# Patient Record
Sex: Female | Born: 1975 | Race: White | Hispanic: No | Smoking: Current every day smoker
Health system: Southern US, Community
[De-identification: ages and names within clinical notes are randomized; demographics above are authoritative.]

## PROBLEM LIST (undated history)

## (undated) DIAGNOSIS — R519 Headache, unspecified: Secondary | ICD-10-CM

## (undated) DIAGNOSIS — F319 Bipolar disorder, unspecified: Secondary | ICD-10-CM

## (undated) DIAGNOSIS — F411 Generalized anxiety disorder: Secondary | ICD-10-CM

## (undated) DIAGNOSIS — C4492 Squamous cell carcinoma of skin, unspecified: Secondary | ICD-10-CM

## (undated) DIAGNOSIS — K589 Irritable bowel syndrome without diarrhea: Secondary | ICD-10-CM

## (undated) DIAGNOSIS — M549 Dorsalgia, unspecified: Secondary | ICD-10-CM

## (undated) DIAGNOSIS — J45909 Unspecified asthma, uncomplicated: Secondary | ICD-10-CM

## (undated) DIAGNOSIS — A048 Other specified bacterial intestinal infections: Secondary | ICD-10-CM

## (undated) DIAGNOSIS — I10 Essential (primary) hypertension: Secondary | ICD-10-CM

## (undated) DIAGNOSIS — F419 Anxiety disorder, unspecified: Secondary | ICD-10-CM

## (undated) DIAGNOSIS — J449 Chronic obstructive pulmonary disease, unspecified: Secondary | ICD-10-CM

## (undated) HISTORY — DX: Generalized anxiety disorder: F41.1

## (undated) HISTORY — PX: ABDOMINAL HYSTERECTOMY: SHX81

## (undated) HISTORY — DX: Unspecified asthma, uncomplicated: J45.909

## (undated) HISTORY — DX: Headache, unspecified: R51.9

## (undated) HISTORY — DX: Chronic obstructive pulmonary disease, unspecified: J44.9

## (undated) HISTORY — DX: Squamous cell carcinoma of skin, unspecified: C44.92

## (undated) HISTORY — PX: CHOLECYSTECTOMY: SHX55

---

## 1998-12-01 ENCOUNTER — Other Ambulatory Visit: Admission: RE | Admit: 1998-12-01 | Discharge: 1998-12-01 | Payer: Self-pay | Admitting: *Deleted

## 1999-01-06 ENCOUNTER — Encounter (INDEPENDENT_AMBULATORY_CARE_PROVIDER_SITE_OTHER): Payer: Self-pay | Admitting: *Deleted

## 1999-01-06 ENCOUNTER — Ambulatory Visit (HOSPITAL_COMMUNITY): Admission: RE | Admit: 1999-01-06 | Discharge: 1999-01-06 | Payer: Self-pay | Admitting: Obstetrics and Gynecology

## 2000-09-26 ENCOUNTER — Other Ambulatory Visit: Admission: RE | Admit: 2000-09-26 | Discharge: 2000-09-26 | Payer: Self-pay | Admitting: *Deleted

## 2000-10-01 ENCOUNTER — Ambulatory Visit (HOSPITAL_COMMUNITY): Admission: RE | Admit: 2000-10-01 | Discharge: 2000-10-01 | Payer: Self-pay | Admitting: Infectious Diseases

## 2001-11-04 ENCOUNTER — Other Ambulatory Visit: Admission: RE | Admit: 2001-11-04 | Discharge: 2001-11-04 | Payer: Self-pay | Admitting: Family Medicine

## 2001-11-13 ENCOUNTER — Other Ambulatory Visit: Admission: RE | Admit: 2001-11-13 | Discharge: 2001-11-13 | Payer: Self-pay | Admitting: Family Medicine

## 2002-07-04 ENCOUNTER — Other Ambulatory Visit: Admission: RE | Admit: 2002-07-04 | Discharge: 2002-07-04 | Payer: Self-pay | Admitting: Family Medicine

## 2002-10-08 ENCOUNTER — Ambulatory Visit (HOSPITAL_COMMUNITY): Admission: RE | Admit: 2002-10-08 | Discharge: 2002-10-08 | Payer: Self-pay | Admitting: *Deleted

## 2002-12-09 ENCOUNTER — Ambulatory Visit (HOSPITAL_COMMUNITY): Admission: RE | Admit: 2002-12-09 | Discharge: 2002-12-09 | Payer: Self-pay | Admitting: Family Medicine

## 2002-12-09 ENCOUNTER — Encounter: Payer: Self-pay | Admitting: Family Medicine

## 2002-12-19 ENCOUNTER — Encounter: Payer: Self-pay | Admitting: Family Medicine

## 2002-12-19 ENCOUNTER — Ambulatory Visit (HOSPITAL_COMMUNITY): Admission: RE | Admit: 2002-12-19 | Discharge: 2002-12-19 | Payer: Self-pay | Admitting: Family Medicine

## 2002-12-26 ENCOUNTER — Inpatient Hospital Stay (HOSPITAL_COMMUNITY): Admission: AD | Admit: 2002-12-26 | Discharge: 2002-12-26 | Payer: Self-pay | Admitting: Family Medicine

## 2003-01-26 ENCOUNTER — Other Ambulatory Visit: Admission: RE | Admit: 2003-01-26 | Discharge: 2003-01-26 | Payer: Self-pay | Admitting: Family Medicine

## 2003-03-16 ENCOUNTER — Ambulatory Visit (HOSPITAL_COMMUNITY): Admission: RE | Admit: 2003-03-16 | Discharge: 2003-03-16 | Payer: Self-pay | Admitting: Family Medicine

## 2003-04-09 ENCOUNTER — Observation Stay (HOSPITAL_COMMUNITY): Admission: AD | Admit: 2003-04-09 | Discharge: 2003-04-10 | Payer: Self-pay | Admitting: Family Medicine

## 2003-04-12 ENCOUNTER — Inpatient Hospital Stay (HOSPITAL_COMMUNITY): Admission: AD | Admit: 2003-04-12 | Discharge: 2003-04-12 | Payer: Self-pay | Admitting: Family Medicine

## 2003-05-13 ENCOUNTER — Ambulatory Visit (HOSPITAL_COMMUNITY): Admission: RE | Admit: 2003-05-13 | Discharge: 2003-05-13 | Payer: Self-pay | Admitting: Family Medicine

## 2003-05-27 ENCOUNTER — Inpatient Hospital Stay (HOSPITAL_COMMUNITY): Admission: AD | Admit: 2003-05-27 | Discharge: 2003-05-29 | Payer: Self-pay | Admitting: Family Medicine

## 2003-06-30 ENCOUNTER — Inpatient Hospital Stay (HOSPITAL_COMMUNITY): Admission: AD | Admit: 2003-06-30 | Discharge: 2003-06-30 | Payer: Self-pay | Admitting: Family Medicine

## 2003-07-20 ENCOUNTER — Inpatient Hospital Stay (HOSPITAL_COMMUNITY): Admission: AD | Admit: 2003-07-20 | Discharge: 2003-07-23 | Payer: Self-pay | Admitting: Family Medicine

## 2003-07-21 ENCOUNTER — Encounter (INDEPENDENT_AMBULATORY_CARE_PROVIDER_SITE_OTHER): Payer: Self-pay | Admitting: Specialist

## 2004-08-08 ENCOUNTER — Other Ambulatory Visit: Admission: RE | Admit: 2004-08-08 | Discharge: 2004-08-08 | Payer: Self-pay | Admitting: Family Medicine

## 2005-01-17 ENCOUNTER — Other Ambulatory Visit: Admission: RE | Admit: 2005-01-17 | Discharge: 2005-01-17 | Payer: Self-pay | Admitting: Gynecology

## 2005-04-27 ENCOUNTER — Inpatient Hospital Stay (HOSPITAL_COMMUNITY): Admission: RE | Admit: 2005-04-27 | Discharge: 2005-04-28 | Payer: Self-pay | Admitting: Obstetrics and Gynecology

## 2005-04-27 ENCOUNTER — Encounter (INDEPENDENT_AMBULATORY_CARE_PROVIDER_SITE_OTHER): Payer: Self-pay | Admitting: *Deleted

## 2005-09-22 ENCOUNTER — Encounter
Admission: RE | Admit: 2005-09-22 | Discharge: 2005-09-22 | Payer: Self-pay | Admitting: Physical Medicine and Rehabilitation

## 2006-02-27 ENCOUNTER — Encounter: Admission: RE | Admit: 2006-02-27 | Discharge: 2006-02-27 | Payer: Self-pay | Admitting: Family Medicine

## 2007-03-06 ENCOUNTER — Encounter: Admission: RE | Admit: 2007-03-06 | Discharge: 2007-03-06 | Payer: Self-pay | Admitting: General Surgery

## 2007-03-08 ENCOUNTER — Ambulatory Visit (HOSPITAL_COMMUNITY): Admission: RE | Admit: 2007-03-08 | Discharge: 2007-03-08 | Payer: Self-pay | Admitting: General Surgery

## 2007-03-15 ENCOUNTER — Encounter: Admission: RE | Admit: 2007-03-15 | Discharge: 2007-03-15 | Payer: Self-pay | Admitting: General Surgery

## 2007-03-19 ENCOUNTER — Encounter: Admission: RE | Admit: 2007-03-19 | Discharge: 2007-03-19 | Payer: Self-pay | Admitting: General Surgery

## 2007-04-12 ENCOUNTER — Encounter (INDEPENDENT_AMBULATORY_CARE_PROVIDER_SITE_OTHER): Payer: Self-pay | Admitting: General Surgery

## 2007-04-12 ENCOUNTER — Ambulatory Visit (HOSPITAL_COMMUNITY): Admission: RE | Admit: 2007-04-12 | Discharge: 2007-04-12 | Payer: Self-pay | Admitting: General Surgery

## 2007-09-16 ENCOUNTER — Encounter: Admission: RE | Admit: 2007-09-16 | Discharge: 2007-09-16 | Payer: Self-pay | Admitting: General Surgery

## 2008-01-20 ENCOUNTER — Ambulatory Visit (HOSPITAL_BASED_OUTPATIENT_CLINIC_OR_DEPARTMENT_OTHER): Admission: RE | Admit: 2008-01-20 | Discharge: 2008-01-21 | Payer: Self-pay | Admitting: Urology

## 2008-02-01 ENCOUNTER — Emergency Department (HOSPITAL_COMMUNITY): Admission: EM | Admit: 2008-02-01 | Discharge: 2008-02-01 | Payer: Self-pay | Admitting: Emergency Medicine

## 2008-12-08 ENCOUNTER — Emergency Department (HOSPITAL_BASED_OUTPATIENT_CLINIC_OR_DEPARTMENT_OTHER): Admission: EM | Admit: 2008-12-08 | Discharge: 2008-12-08 | Payer: Self-pay | Admitting: Emergency Medicine

## 2009-02-19 ENCOUNTER — Encounter: Admission: RE | Admit: 2009-02-19 | Discharge: 2009-02-19 | Payer: Self-pay | Admitting: Family Medicine

## 2010-02-21 ENCOUNTER — Encounter: Admission: RE | Admit: 2010-02-21 | Discharge: 2010-02-21 | Payer: Self-pay | Admitting: Family Medicine

## 2010-07-08 LAB — HEMOCCULT GUIAC POC 1CARD (OFFICE): Fecal Occult Bld: POSITIVE

## 2010-07-08 LAB — URINALYSIS, ROUTINE W REFLEX MICROSCOPIC
Glucose, UA: NEGATIVE mg/dL
Protein, ur: NEGATIVE mg/dL
pH: 6.5 (ref 5.0–8.0)

## 2010-08-16 NOTE — Op Note (Signed)
NAMEARLIN, SAVONA             ACCOUNT NO.:  [id number]   MEDICAL RECORD NO.:  [id number]          PATIENT TYPE:  OIB   LOCATION:  2550                         FACILITY:  [facility]   PHYSICIAN:  Gabrielle Dare. Janee Morn, M.D.DATE OF BIRTH:  04/08/1975   DATE OF PROCEDURE:  04/12/2007  DATE OF DISCHARGE:                               OPERATIVE REPORT   PREOPERATIVE DIAGNOSIS:  Biliary dyskinesia.   POSTOPERATIVE DIAGNOSES:  1. Biliary dyskinesia.  2. Chronic cholecystitis.   PROCEDURE:  Laparoscopic cholecystectomy with intraoperative  cholangiogram.   SURGEON:  Gabrielle Dare. Janee Morn, M.D.   ASSISTING:  Currie Paris, M.D.   ANESTHESIA:  General.   HISTORY OF PRESENT ILLNESS:  Ms. Rightmyer is a 35 year old female who I  evaluated in the office for right upper quadrant abdominal pain.  In  addition, she was noted to have right breast mass on mammogram.  Ultrasound was unremarkable and MRI was subsequently done on March 15, 2007 demonstrating no evidence of malignancy in either breast.  Follow-up is arranged.  She does continue however to have postprandial  right upper quadrant pain and nausea consistent with biliary dyskinesia.  HIDA scan gallbladder ejection fraction was 50%.  She presents for  elective cholecystectomy as her symptoms continue.   PROCEDURE IN DETAIL:  Informed consent was obtained.  The patient was  identified in the preop holding area.  She received intravenous  antibiotics.  She was brought to the operating room and general  anesthesia was administered by the anesthesia team.  Her abdomen was  prepped and draped in a sterile fashion.  Infraumbilical region was  infiltrated with 0.25% Marcaine with epinephrine.  Infraumbilical  incision was made.  Subcutaneous tissues were dissected down revealing  anterior fascia.  This was divided sharply.  Peritoneal cavity was  entered under direct vision without difficulty.  A 0 Vicryl pursestring  suture was placed  around the fascial opening.  The Hasson trocar was  inserted into the abdomen and the abdomen was insufflated with carbon  dioxide in the standard fashion.  Under direct vision an 11 mm  epigastric and two 5 mm lateral ports were placed.  Quarter percent  Marcaine with epinephrine was placed at all port sites.  The dome of the  gallbladder was retracted superomedially.  There was a large amount of  filmy adhesions due to chronic inflammation around the body and  infundibulum of the gallbladder.  These were gradually swept away  carefully revealing the infundibulum.  The infundibulum was retracted  inferolaterally.  Dissection began laterally and progressed medially,  identifying the anterior branch of the cystic artery and the cystic  duct.  The cystic artery branch was clipped twice proximally and once  distally and divided.  Further dissection was carried out on the cystic  duct until a large window was created between the infundibulum, the  cystic duct and the liver.  Once we had excellent visualization, a clip  was placed on the infundibulum/cystic duct junction.  A small nick was  made in the cystic duct and a Reddick cholangiogram catheter was  inserted.  Intraoperative cholangiogram  was obtained, demonstrating good  flow with no filling defects in the common bile duct.  Contrast entered  the duodenum readily.  Cholangiogram was completed.  The catheter was  removed.  Three clips were placed proximally on the cystic duct and it  was divided.  Further dissection revealed a posterior branch of the  cystic artery.  This was clipped twice proximally and once distally and  divided.  The gallbladder was taken off the liver bed with the Bovie  cautery, achieving excellent hemostasis along the way.  The gallbladder  was placed in an EndoCatch bag and removed from the abdomen via the  infraumbilical port site.  The abdomen was copiously irrigated, the  irrigation was good and returned clear.   The liver bed was rechecked and  was completely dry.  Clips remained in excellent position.  The  remainder of the irrigation fluid was evacuated.  The liver bed was  rechecked and remained dry.  Ports were removed under direct vision and  the pneumoperitoneum was released.  The infraumbilical fascia was closed  by tying the 0 Vicryl pursestring suture.  All 4 wounds were copiously  irrigated.  The infraumbilical and epigastric port sites skin was closed  with running 4-0 Vicryl subcuticular stitch.  Dermabond was then used on  all 4 port sites.  The patient tolerated the procedure well without any  complication and was taken to the recovery room in stable condition.      Gabrielle Dare Janee Morn, M.D.  Electronically Signed     BET/MEDQ  D:  04/12/2007  T:  04/12/2007  Job:  [id number]   cc:   Dr. Gennette Pac

## 2010-08-16 NOTE — Op Note (Signed)
NAMEGLORINE, HANRATTY             ACCOUNT NO.:  [id number]   MEDICAL RECORD NO.:  [id number]          PATIENT TYPE:  AMB   LOCATION:  NESC                         FACILITY:  [facility]   PHYSICIAN:  Sigmund I. Patsi Sears, M.D.DATE OF BIRTH:  11-Feb-1976   DATE OF PROCEDURE:  01/20/2008  DATE OF DISCHARGE:                               OPERATIVE REPORT   PREOPERATIVE DIAGNOSES:  1. Voiding dysfunction, status post pubovaginal sling placement.  2. Cystocele, symptomatic.   POSTOPERATIVE DIAGNOSES:  1. Voiding dysfunction, status post pubovaginal sling placement.  2. Cystocele, symptomatic.   PROCEDURE:  1. Anterior vault repair with placement of uphold mesh (AMS).  2. Urethrolysis of pubovaginal sling.  3. Cystoscopy.   ATTENDING PHYSICIAN:  Dr. Jethro Bolus.   RESIDENT PHYSICIAN:  Dr. Delman Kitten.   ANESTHESIA:  General.   INDICATIONS FOR PROCEDURE:  Kadejah is a 35 year old white female with  multiple urogynecologic tissues.  She underwent laparoscopic-assisted  vaginal hysterectomy with a tension-free vaginal tape from top down,  anterior colporrhaphy and perineoplasty on April 27, 2005.  Her  indication for hysterectomy was painful heavy menses.  At that time she  was noted to have pure stress urinary incontinence and subsequently had  to the TVT placed.  Postoperatively she developed urinary retention and  subsequent voiding dysfunction.  She has been recently evaluated by Dr.  Patsi Sears, who performed urodynamics on her which demonstrated an  obstructive voiding pattern.  Exam also revealed the cystocele.  Based  on the above findings, he recommended the above-stated procedures.  All  risks, benefits, consequences and concerns were discussed and informed  consent was obtained.   PROCEDURE IN DETAIL:  The patient is brought to the operating room,  placed in supine position.  She was correctly identified by wristband  and appropriate time-out was taken.  IV  antibiotics were administered  and general anesthesia was delivered.  Once adequately anesthetized, she  was placed in a high lithotomy position with great care taken to  minimize risk of peripheral neuropathy or compartment syndrome.  Her  perineum was clipped, prepped and draped sterilely.  We began our  procedure by performing thorough physical examination.  Under  anesthesia, the cystocele with was approximately grade 3.  There did not  appear to be a middle or posterior compartment defect associated with  this.  The anterior vaginal mucosa did have a hobnail like appearance  consistent with prior incision.  We placed a 16-French Foley catheter  transurethrally into the bladder.  We drained the bladder and then  capped it.  We then placed our [facility] retractor with hooks exposing  the entire introitus.  We then made a semilunar incision across the  anterior vaginal wall sharply and created our flap initially with the  Strully scissors, but then once we got through the scar, we were able to  use blunt dissection as well.  We did take great detail to keep our flap  full-thickness.  We extended our dissection on the right and left side  until we were able to palpate both ischial spines.  Her left ischial  spine  was significantly more prominent than the right.  We then  identified the apex of the vault and dissected in the midline back to  this apex.  It should be noted that this is a redo operation and our  procedure was technically difficult secondary to the prior surgery and  subsequent scar.  We then placed a single 2-0 Vicryl suture in the  midline of the apex of our flap to anchor the uphold mesh.  We then used  our Capio device to pass our anchor hooks into both sacrospinous  ligaments, first the right, then the left.  We tested the strength of  these passages and they appeared adequate.  We then cinched down our  mesh keeping the blue line in the midline until we had snugged it  up  appropriately to the anterior vaginal wall.  We then tied our initial  holding suture in the midline.  We then positioned our uphold mesh  appropriately giving the right amount of tension and elevation to the  anterior vaginal vault and apex.  We then placed two more sutures on  either side of this apical suture both using 2-0 Vicryl.  We then  removed the sleeves from the uphold device and trimmed excess flap  tissue.  The uphold mesh completely reduced her cystocele and elevated  her apex as well.  We then copiously irrigated our wound with antibiotic  solution.  There was no active bleeding as we subsequently closed the  vaginal mucosa with a running suture using 2-0 Vicryl.  We then turned  our attention to the urethrolysis.  Again, we drained her bladder prior  to proceeding.  We then made a linear incision in the midline of the  anterior vaginal wall distal to the semilunar incision we just made and  closed.  We used blunt dissection to track on either side of the  urethra.  The sling was not noted to be in the mid urethra.  However, it  appeared to have migrated to the proximal urethra.  We were able to  palpate it best to the left of her urethra.  Here we did get into some  slight bleeding.  We were able to incise the mesh completely thus  releasing it.  We again copiously irrigated our wound.  We placed a  strip of Surgicel into the wound as well as a deep suture to try to  abate the bleeding.  We then removed the ureteral catheter performed  rigid cystourethroscopy with a 12 degree and 70 degrees lens.  Indigo  carmine had been given and we appreciated blue jets of urine effluxing  from both ureteral orifices.  She did have a slight amount of debris in  her bladder as well as evidence of significant trigonitis.  She also had  mild trabeculation to her bladder suggestive of longstanding voiding  dysfunction.  No other abnormalities were seen in her bladder.  There  was no  evidence of injury to her urethra or her bladder.  I then removed  the scope and replaced a Foley catheter inflated with 10 mL sterile  water.  We then closed the midline vaginal incision in two layers using  2-0 Vicryl.  We then placed a vaginal packing with Estrace cream in her  vaginal vault and this marked the end of our procedure.  She awoke from  anesthesia was taken to recovery room in stable condition.  There were  no complications.  Dr. Patsi Sears was present and  participated in all  aspects of the case.     ______________________________  Delman Kitten, MD      Sigmund I. Patsi Sears, M.D.  Electronically Signed    Hadley Pen  D:  01/20/2008  T:  01/20/2008  Job:  [id number]

## 2010-08-19 NOTE — Op Note (Signed)
[name], [name]             ACCOUNT NO.:  [id number]   MEDICAL RECORD NO.:  [id number]          PATIENT TYPE:  INP   LOCATION:  9313                          FACILITY:  [facility]   PHYSICIAN:  Randye Lobo, M.D.   DATE OF BIRTH:  [birth year]   DATE OF PROCEDURE:  04/27/2005  DATE OF DISCHARGE:                                 OPERATIVE REPORT   PREOPERATIVE DIAGNOSIS:  1.  Genuine stress incontinence.  2.  Incomplete uterovaginal prolapse.  3.  Dysmenorrhea.  4.  Menorrhagia.   POSTOPERATIVE DIAGNOSIS:  1.  Genuine stress incontinence.  2.  Incomplete uterovaginal prolapse.  3.  Dysmenorrhea.  4.  Menorrhagia.   PROCEDURE:  Laparoscopically-assisted vaginal hysterectomy, tension-free  vaginal tape, cystoscopy, anterior colporrhaphy, perineoplasty.   SURGEON:  Conley Simmonds, M.D.   ASSISTANT:  Carrington Clamp, M.D.   ANESTHESIA:  Is general endotracheal.   IV FLUIDS:  2000 mL Ringer's lactate.   ESTIMATED BLOOD LOSS:  Was 300 mL.   URINE OUTPUT:  Quantity sufficient   COMPLICATIONS:  None.   INDICATIONS FOR PROCEDURE:  The patient is a 35 year old para 67 Caucasian  female who was referred by Dr. Teodora Medici for evaluation and treatment of  urinary incontinence, dysmenorrhea and menorrhagia. The patient had a  history of endometriosis and was status post laparoscopy several years  prior. The patient was experiencing urinary leakage with coughing, sneezing  and running in addition to episodes of urge incontinence and enuresis.  Urodynamic testing confirmed the presence of genuine stress incontinence.  The patient had a normal pelvic ultrasound.   The patient was status post bilateral tubal ligation and she declines any  future childbearing. She wishes for definitive treatment of her heavy and  painful menses and would like surgical treatment of her urinary incontinence  as well. The plan is made to proceed with surgery after risks, benefits, and  alternatives were  discussed with her.   FINDINGS:  Examination under anesthesia revealed a third degree cystocele,  first-degree uterine prolapse, and a minimal rectocele.   At the time of laparoscopy the patient is noted to have a normal uterus,  tubes, and ovaries. There is a 2 cm simple left ovarian cyst. The appendix,  liver and stomach organ appeared to be unremarkable. There is no evidence of  endometriosis in the abdomen or the pelvis.   Cystoscopy performed during the mid urethral sling documented the absence of  a foreign body in the bladder or the urethra. The bladder was visualized  throughout 360 degrees and was noted to be normal. The ureters were patent  bilaterally after the injection of IV indigo carmine.   SPECIMEN:  The uterus was sent to pathology.   PROCEDURE:  The patient was reidentified in the preoperative hold area. She  received Mefoxin 1 gram IV for antibiotic prophylaxis. She received both TED  hose and PAS stockings for DVT prophylaxis.   In the operating room, general endotracheal anesthesia was induced and the  patient was then placed in the dorsal lithotomy position. The abdomen and  vagina were then sterilely prepped and draped and  a Foley catheter was  placed inside the bladder. The single-tooth tenaculum was placed on the  anterior cervical lip and this was replaced with a Hulka tenaculum.   The procedure began by creating a 1 cm umbilical incision with the scalpel.  A 10 mm trocar was inserted directly into the peritoneal cavity and the  laparoscope confirmed proper placement. The patient was then placed in  Trendelenburg position. A 5 mm right and then left lower quadrant incisions  were created with the scalpel and 5 mm trocars were inserted through each of  these under visualization of laparoscope.   An inspection of the pelvic and abdominal organs was performed with the  findings were as noted above. A tripolar instrument was used to perform the  laparoscopic  portion of the vaginal hysterectomy. The left proximal  fallopian tube, the left round ligament and then the left utero-ovarian  ligaments were sequentially grasped with tripolar instrument, cauterized,  and then divided each separately. The same procedure that was performed on  the left-hand side was then repeated on the right-hand side. The dissection  on the right-hand side had extended slightly into the myometrium and there  was some back-bleeding which was appreciated at this time. The laparoscopic  portion of the procedure was discontinued and the remainder of the vaginal  hysterectomy was performed in standard fashion from below.   The patient was placed into the high lithotomy position and a weighted  speculum was placed inside the vagina. The single-tooth tenaculum was placed  on the anterior cervical lip and a Jacobs tenaculum was placed on the  posterior cervical lip. The cervix was injected with half percent lidocaine  with 1:200,000 of epinephrine and then the cervix was circumscribed with the  scalpel. The dissection was carried down to the endopelvic fascia using the  Mayo scissors. Initially the posterior cul-de-sac could not be entered and  Heaney clamps were used to grasp the most distal portions of the uterosacral  ligaments were then clamped, sharply divided, suture ligated with 0 Vicryl  bilaterally. A second clamp was placed along the uterosacral ligaments  bilaterally, sharply divided, and suture ligated with transfixing sutures of  0 Vicryl at this time. The posterior cul-de-sac was then entered sharply and  a long weighted speculum was placed in the posterior cul-de-sac after  digital exam confirmed proper entry into this location. The anterior cul-de-  sac was entered sharply and a Deaver retractor was placed into the anterior cul-de-sac. Again after digital exam confirmed proper entry into the space.  The cardinal ligaments were then sequentially clamped, sharply  divided, and  suture ligated with 0 Vicryl. The inferior aspects of each of the broad  ligaments were then clamped, sharply divided, and suture ligated with 0  Vicryl bilaterally. The specimen was removed at this time and was sent to  pathology. There was some bleeding noted along the right uterosacral  ligament and this responded to a figure-of-eight suture of 0 Vicryl. The  posterior vaginal cuff was then whip stitched with a running locked suture  of 0 Vicryl posteriorly. A figure-of-eight suture was placed at the vaginal  cuff at the 9 o'clock position just above the uterosacral ligament where it  was bleeding. This created good hemostasis. A McCall culdoplasty suture was  performed next. 0 Vicryl was used to come from the vagina into the posterior  cul-de-sac at the 6 o'clock position, through the uterosacral ligament on  the patient's left-hand side, across the posterior cul-de-sac  in a  pursestring fashion and down through the uterosacral ligament on the  patient's right-hand side before coming out of the vagina again at the 6  o'clock position. This was tied at the end of the case to provide excellent  elevation of the vaginal cuff.   The anterior colporrhaphy was performed next. Allis clamps were used to mark  the midline of the anterior vaginal wall which was then injected with half  percent lidocaine with 1:200,000 of epinephrine. The anterior vaginal mucosa  was then incised vertically with the Metzenbaum scissors. The subvaginal  tissue and the bladder were dissected off the overlying vaginal epithelium  bilaterally using a combination of sharp and blunt dissection. The  dissection was carried back to the pubic rami but was not carried through  into the retropubic space.   The tension-free vaginal tape was performed next. The suprapubic incisions  were created in a puncture type fashion 2.5 cm to the right and left in the  midline superior to the pubic symphysis. The TVT was  performed in a top-down  fashion. The abdominal needle passer was placed first through the patient's  right suprapubic incision and out through the vagina at the level of the mid  urethra and lateral to it. The same procedure performed on the right-hand  side was then repeated on the left-hand side. The Foley catheter was removed  and cystoscopy was performed and the findings were as noted above. The sling  was then attached to the abdominal needle passers and the sling was drawn up  through the suprapubic incisions bilaterally. The plastic sheaths were then  separated from the sling and a Kelly clamp was used to create a space  underneath the sling and anterior to the urethra as the plastic sheaths were  removed. The excess sling material was then trimmed suprapubically. The  anterior colporrhaphy was performed next with vertical mattress sutures of 2- 0 Vicryl. Hemostasis was also created using monopolar cautery during the  anterior colporrhaphy. Excess anterior vaginal wall mucosa was trimmed and  the anterior vaginal wall was closed with a running locked suture of 2-0  Vicryl. This closure was continued along the vaginal cuff in a running  locked fashion as well.   The posterior vaginal wall was assessed at this time and there was a minimal  rectocele appreciated. The perineoplasty was performed at this time. Allis  clamps were used to mark the midline of the posterior vaginal wall for a  distance of approximately 3 cm inside the hymenal ring. The vaginal mucosa  and perineum were injected with half percent lidocaine with 1:200,000 of  epinephrine. A triangular wedge of epithelium was removed from the perineal  body. The posterior vaginal mucosa was then incised vertically. Perirectal  fascia was dissected off of the overlying vaginal mucosa bilaterally. The  perineoplasty was performed with a combination of vertical mattress sutures  of 2-0 Vicryl and then 0 Vicryl. A crown stitch of  0 Vicryl was placed.  Excess posterior vaginal wall mucosa was then excised and the posterior  vaginal wall was closed with a running locked suture of 2-0 Vicryl and this  was continued along the perineum in a subcuticular fashion as for an  episiotomy. The knot was tied at the hymen. The culdoplasty suture was tied  at this time and there was excellent support of the vaginal cuff. A vaginal  packing with Estrace cream was then placed inside the vagina. Final rectal  exam confirmed the  absence of sutures in the rectum.   The patient was then placed in the low lithotomy position and the  pneumoperitoneum was achieved to evaluate hemostasis intraperitoneally.  There was some bleeding noted overlying the bladder on the patient's left-  hand side and this responded well to bipolar cautery. There was also a small  amount of oozing at the vaginal cuff in the midline which also responded  well to monopolar cautery. Hemostasis was then noted to be excellent at this  time. The pneumoperitoneum was partially released and the surgical sites  were reevaluated and hemostasis was good. The lower abdominal trocars were  removed at this time. The pneumoperitoneum was completely released and the  10 mm umbilical trocar and the laparoscope were removed simultaneously. The  laparoscopic skin incisions were closed with inverted subcuticular sutures  of 4-0 plain. The suprapubic TVT incisions were closed with Dermabond. The  laparoscopic incisions were injected with half percent Marcaine plain. These  incisions were covered sterilely. The  patient was cleansed of any remaining Betadine, awakened, extubated. This  concluded the patient's surgery. There were no complications. All needle,  instrument, sponge counts are correct. She was escorted to the recovery room  in stable and awake condition.      Randye Lobo, M.D.  Electronically Signed    BES/MEDQ  D:  04/27/2005  T:  04/27/2005  Job:   [id number]   cc:   Leatha Gilding. Mezer, M.D.  Fax: [phone]

## 2010-08-19 NOTE — Op Note (Signed)
NAME:  [name], [name]                       ACCOUNT NO.:  [id number]   MEDICAL RECORD NO.:  [id number]                   PATIENT TYPE:  INP   LOCATION:  9128                                 FACILITY:  [facility]   PHYSICIAN:  Conni Elliot, M.D.             DATE OF BIRTH:  1975-09-29   DATE OF PROCEDURE:  07/21/2003  DATE OF DISCHARGE:                                 OPERATIVE REPORT   PREOPERATIVE DIAGNOSIS:  Desires surgical sterilization.   POSTOPERATIVE DIAGNOSIS:  Desires surgical sterilization.   OPERATION:  Modified bilateral Pomeroy tubal ligation.   OPERATOR:  Conni Elliot, M.D.   ANESTHESIA:  Continuous lumbar epidural.   OPERATIVE FINDINGS:  Uterus, tubes, and ovaries were normal for the post-  gravid state.   OPERATIVE PROCEDURE:  After placing the patient under continuous lumbar  epidural anesthetic, the patient was supine and was prepped and draped in  sterile fashion.  A periumbilical incision was made, incision was made  through skin, subcutaneous tissue, and fascia, the peritoneal cavity  entered.  The right fallopian tube was identified and grasped with a Babcock  clamp.  At this point it was noted that there was a stable hematoma that  remained stable throughout the operative procedure in the right broad  ligament.  The fallopian tube was tied with free ligatures and then a 1.5-2  cm segment was excised.  Hemostasis was adequate.  The same procedure was  done on the opposite side, hemostasis again adequate.  We re-examined the  right adnexa and the hematoma appeared to be stable still.  The anterior  peritoneum, fascia, subcutaneous tissue, and skin were then closed in  routine fashion.  Estimated blood loss was less than 10 mL.  Needle and  sponge count were reported as correct.                                               Conni Elliot, M.D.    ASG/MEDQ  D:  07/21/2003  T:  07/22/2003  Job:  [id number]

## 2010-08-19 NOTE — H&P (Signed)
[name], [name]             ACCOUNT NO.:  [id number]   MEDICAL RECORD NO.:  [id number]          PATIENT TYPE:  INP   LOCATION:  NA                            FACILITY:  [facility]   PHYSICIAN:  Randye Lobo, M.D.   DATE OF BIRTH:  04-20-1975   DATE OF ADMISSION:  04/27/2005  DATE OF DISCHARGE:                                HISTORY & PHYSICAL   CHIEF COMPLAINT:  Urinary incontinence, painful and heavy menstrual periods.   HISTORY OF PRESENT ILLNESS:  The patient is a 35 year old gravida 3, para 3,  Caucasian female, status post bilateral tubal ligation, who was referred by  Dr. Teodora Medici for evaluation of urinary incontinence along with  dysmenorrhea and menorrhagia. The patient reported urinary leakage with  coughing, sneezing, running and jumping. She also reported episodes of urge  incontinence and enuresis. The patient did have urodynamic testing performed  which documented the presence of genuine stress incontinence at a bladder  capacity of 116 cc. Leak point pressure was noted to be 78 cm of water. The  uroflometer study had an intermittent pattern to it. The patient's pressure  flow study had a maximum detrusor pressure of 55 cm of water.   The patient did undergo a pelvic ultrasound documenting a normal uterus with  an endometrial stripe of 1.47 cm. The ovaries were unremarkable. The patient  had a hemoglobin of 13.6. The patient declined oral contraceptive therapy  for her painful and heavy cycles as she had previously undergone a bilateral  tubal ligation.   The patient now wished for surgical treatment of her urinary incontinence.  She declines any future childbearing and wishes for definitive treatment of  her heavy and painful menstrual periods. She would also like evaluation of  her pelvic pain at the time of surgery.   PAST OBSTETRIC AND GYNECOLOGIC HISTORY:  The patient is status post vaginal  delivery x3. Her largest child weighed 6 pounds, 11 ounces. She is  status  post postpartum tubal ligation. The patient is status post laparoscopy with  fulguration of the endometriosis 8 years ago. The patient's last Pap smear  was performed in October of 2006 and showed atypical squamous cells. Her HPV  testing was negative.   PAST MEDICAL HISTORY:  1.  Tobacco use. The patient smokes one pack of cigarettes per day.  2.  Bipolar disorder with panic attacks.  3.  Hyperlipidemia.   PAST SURGICAL HISTORY:  1.  Status post postpartum tubal ligation.  2.  Status post laparoscopy with treatment of endometriosis 8 years ago.   MEDICATIONS:  1.  Wellbutrin 300 mg p.o. daily.  2.  Xanax 1 mg p.o. q.i.d. p.r.n.   ALLERGIES:  No known drug allergies.   SOCIAL HISTORY:  The patient is married. The patient is a smoker. She has  quit the use of tobacco just 3 weeks ago. The patient does not report any  alcohol or drug use.   FAMILY HISTORY:  Positive for hypertension, hyperlipidemia and diabetes in  the patient's father.   PHYSICAL EXAMINATION:  VITAL SIGNS:  Height 5 feet, 1 inch, weight  is 114  pounds. Blood pressure 120/80.  GENERAL:  The patient is a young female in no acute distress.  LUNGS:  Clear to auscultation bilaterally.  HEART:  S1, S2 with a regular rate and rhythm.  ABDOMEN:  Soft and nontender and without evidence of hepatosplenomegaly or  organomegaly.  PELVIC EXAM:  Normal external genitalia and urethra. The patient has a  second to third degree cystocele, first degree uterine prolapse and a first  degree rectocele, first degree uterine prolapse, and a first degree  rectocele. The uterus is small and nontender and there are no adnexal masses  or tenderness appreciated.   IMPRESSION:  The patient is a 35 year old para 3 female status post  bilateral tubal ligation, who has genuine stress incontinence, incomplete  uterine vaginal prolapse, dysmenorrhea and menorrhagia. She does have a  history of endometriosis.   PLAN:  The patient  will undergo a laparoscopically assisted vaginal  hysterectomy with possible treatment of endometriosis, and anterior and  posterior colporrhaphy with a tension free vaginal tape, and cystoscopy at  [facility] on April 27, 2005. Risks, benefits, and  alternatives have been discussed with the patient and she wishes to proceed.      Randye Lobo, M.D.  Electronically Signed     BES/MEDQ  D:  04/26/2005  T:  04/26/2005  Job:  [id number]

## 2010-08-19 NOTE — Discharge Summary (Signed)
[name], [name]                       ACCOUNT NO.:  [id number]   MEDICAL RECORD NO.:  [id number]                   PATIENT TYPE:  INP   LOCATION:  9175                                 FACILITY:  [facility]   PHYSICIAN:  Magnus Sinning. Rice, M.D.              DATE OF BIRTH:  11-30-1975   DATE OF ADMISSION:  05/27/2003  DATE OF DISCHARGE:  05/29/2003                                 DISCHARGE SUMMARY   DISCHARGE DIAGNOSES:  1. Intrauterine pregnancy at 28-5/7.  2. Preterm contractions.  3. History of preterm delivery.  4. Bipolar disorder.  5. Anemia.   DISCHARGE MEDICATIONS:  1. Motrin 600 mg three times a day for one additional day.  2. Continue all other medicines including Lamictal, Seroquel, iron, and     Colace as previously used.  3. Prescription for Prozac 10 mg per day called in to the pharmacy.   DISCHARGE INSTRUCTIONS:  The patient discharged to home with follow up at  [facility] as previously scheduled in approximately  1 week.  Call for further problems with contractions.  The patient advised  to maintain pelvic rest as well as relative bed rest.   HISTORY AND PHYSICAL:  This is a 35 year old G 3, P 1-1-0-2 at 28-3/7 by  first trimester ultrasound who came to the office with the complaint of  leaking of fluid and increased contractions over the 24 hours prior to  evaluation.  She was checked by sterile speculum exam in the office and  found to have no pooling and Nitrazine was negative.  Her cervix was found  to be fingertip external, closed internally, firm and 2 to 3 cm long, not  noted to be much change since the prior exam.  Cervix also noted to have a  lot of ectropion and friability.  On the monitor, the patient was having  contractions every 2 to 3 minutes lasting 40 to 60 seconds and therefore was  sent to the hospital for admission.   PREVIOUS OBSTETRICAL HISTORY:  April 1995 a 6 pound 11 ounce baby at 40  weeks NSVD.  August 1996 she  had a 2 pound 6 ounce baby at 32 weeks after a  spontaneous rupture.   PRENATAL LABORATORY DATA:  A negative, antibody negative, RPR nonreactive,  rubella immune, HB surface antigen negative, HIV negative.  Pap within  normal limits.  GC and Chlamydia negative.  GBS negative x2.  Tetra screen  negative.  Glucola 113 and last hemoglobin 9.9.   SOCIAL HISTORY:  No tobacco currently.  Quit 2-1/2 months ago.  Is married.   PAST MEDICAL HISTORY:  Bipolar disorder.   MEDICATIONS:  1. Seroquel 100 mg tablets two p.o. q.h.s.  2. Lamictal 100 mg per day.  3. Stool softener 6 per day.  4. Iron sulfate 2 tablets q.h.s.   Last ultrasound done on May 13, 2003 at 26-4/7 found the baby to have a  mean gestation age of 26-5/7 and therefore at the 50th to 75th percentile.  Cervix at that time was noted to be 3.3 cm long.  AFI was normal.   PHYSICAL EXAMINATION:  Normal with the exception of a follow up cervical  exam which showed no change from the office.  Fetal heart tones were noted  to be 140 to 145 with accelerations present and no decelerations and on the  tocometer there were contractions noted every 2 to 5 minutes.  A wet prep in  the office showed few bacteria and 5-10 wbc's.  UA in the office was normal.  The patient was there admitted for monitoring.  She was placed on Indocin 50  mg p.o. x1 dose and 25 mg q.4 h.  She was placed on Zantac.  Unasyn was  started at 3 grams IV q.6 h. in consideration of the leukorrhea that she had  in the office even with the history of group B strep negative x2.  It was  planned to give a RhoGAM shot, check a fetal fibronectin, continue her  psychiatric medicines, and the case was discussed with Dr. Penne Lash.  It was  decided that steroids would not be used at this time since there was no  significant dilation.   HOSPITAL COURSE:  The patient had decreased contractions overnight to the  place where she was only having contractions about every 10  minutes.  She  reported much decreased pressure and contractions herself.  Her vital signs  were all normal.  She was having a problem with increasing depression.  The  patient was therefore started on some Prozac at 10 mg per day.  On May 29, 2003 a fetal fibronectin test was sent off and returned negative.  The  patient initially refused a RhoGAM shot but then agreed to have it.  It was,  however, decided to do that in the office since her antibody screen had  already been done in the office.  The case was again discussed with Dr.  Gavin Potters and the patient was discharged home with discharge diagnoses and  medicines as listed above.                                               Magnus Sinning. Rice, M.D.    KMR/MEDQ  D:  05/29/2003  T:  05/29/2003  Job:  [id number]

## 2010-12-22 LAB — CBC
Hemoglobin: 13.4
RBC: 4.35
RDW: 13.6

## 2010-12-22 LAB — COMPREHENSIVE METABOLIC PANEL
ALT: 14
AST: 20
Albumin: 4
Alkaline Phosphatase: 82
GFR calc Af Amer: >60
Glucose, Bld: 84
Potassium: 5
Sodium: 140
Total Protein: 6.9

## 2011-01-03 LAB — URINALYSIS, ROUTINE W REFLEX MICROSCOPIC
Bilirubin Urine: NEGATIVE
Glucose, UA: NEGATIVE
Hgb urine dipstick: NEGATIVE
Ketones, ur: NEGATIVE
pH: 6

## 2011-01-03 LAB — URINE MICROSCOPIC-ADD ON

## 2011-01-03 LAB — POCT PREGNANCY, URINE: Preg Test, Ur: NEGATIVE

## 2011-01-03 LAB — POCT HEMOGLOBIN-HEMACUE: Hemoglobin: 12.6

## 2011-03-06 ENCOUNTER — Telehealth (INDEPENDENT_AMBULATORY_CARE_PROVIDER_SITE_OTHER): Payer: Self-pay | Admitting: General Surgery

## 2011-03-06 NOTE — Telephone Encounter (Signed)
Pt called in stating her attorney called her on Saturday (03/04/11) and left a message advising her to provide her medical history for the past ten years. Patient stated she was out of town and did not get back home until about midnight last night. Patient stated she needed the name of the doctor who removed her gallbladder back in 2007. The patient kept repeating her memory was bad and did not remember all the details. However, the patient mentioned that the doctor was nice to her and actually referred her to the breast center because they "saw something". Patient said as a result she has to go back every year to be tested because of how dense her breast tissue is. Patient stated she was not trying to sue the doctor or anything and was needing the information to give to her attorney because the judge wanted the ten years of medical history information by today. She stated she had to have the information to the attorney by 1:00. I put patient on hold and asked medical records for a chart if available and then spoke with Lawson Fiscal (mgr) in medical records who advised me she has to come into the office with valid identification in order to pick up her information, it cannot be given out over the phone. I picked up line and advised patient of what was required in order to release the information, patient was still insisting that she obtain the information over the phone. Patient started detailing how she would have her signature notarized and all the running around to be done in addition to the attorney needing the information to be put into their records to be then given to the judge. I placed her back on hold and asked Lawson Fiscal if she was willing to talk to the patient and hear the requirement from a Production designer, theatre/television/film. Lawson Fiscal agreed and I transferred the call.

## 2012-05-15 DIAGNOSIS — F319 Bipolar disorder, unspecified: Secondary | ICD-10-CM | POA: Diagnosis present

## 2012-05-15 DIAGNOSIS — K623 Rectal prolapse: Secondary | ICD-10-CM

## 2012-05-15 HISTORY — DX: Rectal prolapse: K62.3

## 2012-06-17 DIAGNOSIS — R102 Pelvic and perineal pain: Secondary | ICD-10-CM

## 2012-06-17 HISTORY — DX: Pelvic and perineal pain: R10.2

## 2012-10-14 DIAGNOSIS — R232 Flushing: Secondary | ICD-10-CM

## 2012-10-14 HISTORY — DX: Flushing: R23.2

## 2013-01-27 DIAGNOSIS — F17200 Nicotine dependence, unspecified, uncomplicated: Secondary | ICD-10-CM | POA: Insufficient documentation

## 2013-04-03 HISTORY — PX: BLADDER SURGERY: SHX569

## 2013-11-18 ENCOUNTER — Other Ambulatory Visit: Payer: Self-pay

## 2013-11-18 DIAGNOSIS — R2230 Localized swelling, mass and lump, unspecified upper limb: Secondary | ICD-10-CM

## 2014-04-01 ENCOUNTER — Emergency Department (HOSPITAL_COMMUNITY): Payer: Medicare Other

## 2014-04-01 ENCOUNTER — Encounter (HOSPITAL_COMMUNITY): Payer: Self-pay

## 2014-04-01 ENCOUNTER — Emergency Department (HOSPITAL_COMMUNITY)
Admission: EM | Admit: 2014-04-01 | Discharge: 2014-04-01 | Disposition: A | Discharge status: Home / Self Care | Payer: Medicare Other | Attending: Emergency Medicine | Admitting: Emergency Medicine

## 2014-04-01 DIAGNOSIS — S3992XA Unspecified injury of lower back, initial encounter: Secondary | ICD-10-CM | POA: Diagnosis not present

## 2014-04-01 DIAGNOSIS — Z880 Allergy status to penicillin: Secondary | ICD-10-CM | POA: Diagnosis not present

## 2014-04-01 DIAGNOSIS — F419 Anxiety disorder, unspecified: Secondary | ICD-10-CM | POA: Insufficient documentation

## 2014-04-01 DIAGNOSIS — S0990XA Unspecified injury of head, initial encounter: Secondary | ICD-10-CM | POA: Diagnosis present

## 2014-04-01 DIAGNOSIS — S199XXA Unspecified injury of neck, initial encounter: Secondary | ICD-10-CM | POA: Diagnosis not present

## 2014-04-01 DIAGNOSIS — M545 Low back pain: Secondary | ICD-10-CM

## 2014-04-01 DIAGNOSIS — Z79899 Other long term (current) drug therapy: Secondary | ICD-10-CM | POA: Diagnosis not present

## 2014-04-01 DIAGNOSIS — Y9389 Activity, other specified: Secondary | ICD-10-CM | POA: Diagnosis not present

## 2014-04-01 DIAGNOSIS — Y92410 Unspecified street and highway as the place of occurrence of the external cause: Secondary | ICD-10-CM | POA: Insufficient documentation

## 2014-04-01 DIAGNOSIS — Z7951 Long term (current) use of inhaled steroids: Secondary | ICD-10-CM | POA: Insufficient documentation

## 2014-04-01 DIAGNOSIS — Z87891 Personal history of nicotine dependence: Secondary | ICD-10-CM | POA: Insufficient documentation

## 2014-04-01 DIAGNOSIS — Y998 Other external cause status: Secondary | ICD-10-CM | POA: Insufficient documentation

## 2014-04-01 DIAGNOSIS — M542 Cervicalgia: Secondary | ICD-10-CM

## 2014-04-01 HISTORY — DX: Anxiety disorder, unspecified: F41.9

## 2014-04-01 HISTORY — DX: Dorsalgia, unspecified: M54.9

## 2014-04-01 MED ORDER — CYCLOBENZAPRINE HCL 10 MG PO TABS: 10.0000 mg | ORAL_TABLET | Freq: Two times a day (BID) | ORAL | Status: DC | PRN

## 2014-04-01 MED ORDER — MORPHINE SULFATE 4 MG/ML IJ SOLN
4.0000 mg | Freq: Once | INTRAMUSCULAR | Status: AC
  Administered 2014-04-01: 4 mg via INTRAMUSCULAR
  Filled 2014-04-01: qty 1

## 2014-04-01 MED ORDER — OXYCODONE-ACETAMINOPHEN 5-325 MG PO TABS: 2.0000 | ORAL_TABLET | ORAL | Status: DC | PRN
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
## 2014-04-01 NOTE — Discharge Instructions (Signed)
You'll be sore for several days. Medication for pain and muscle spasm. Ice pack to painful areas. Follow-up with orthopedics for continued pain. Phone number given.
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
## 2014-04-01 NOTE — ED Notes (Signed)
C-Collar off per Dr. Geralynn Ochs order. Patient sitting up, water given.

## 2014-04-20 ENCOUNTER — Ambulatory Visit: Payer: Self-pay | Admitting: Diagnostic Neuroimaging

## 2014-10-15 DIAGNOSIS — K589 Irritable bowel syndrome without diarrhea: Secondary | ICD-10-CM

## 2014-10-15 HISTORY — DX: Irritable bowel syndrome, unspecified: K58.9

## 2015-01-13 ENCOUNTER — Encounter (HOSPITAL_COMMUNITY): Payer: Self-pay | Admitting: Emergency Medicine

## 2015-01-13 ENCOUNTER — Emergency Department (HOSPITAL_COMMUNITY)
Admission: EM | Admit: 2015-01-13 | Discharge: 2015-01-13 | Disposition: A | Discharge status: Home / Self Care | Payer: Medicare Other | Attending: Emergency Medicine | Admitting: Emergency Medicine

## 2015-01-13 DIAGNOSIS — G5701 Lesion of sciatic nerve, right lower limb: Secondary | ICD-10-CM | POA: Diagnosis not present

## 2015-01-13 DIAGNOSIS — F419 Anxiety disorder, unspecified: Secondary | ICD-10-CM | POA: Diagnosis not present

## 2015-01-13 DIAGNOSIS — Z87891 Personal history of nicotine dependence: Secondary | ICD-10-CM | POA: Insufficient documentation

## 2015-01-13 DIAGNOSIS — Z79899 Other long term (current) drug therapy: Secondary | ICD-10-CM | POA: Diagnosis not present

## 2015-01-13 DIAGNOSIS — M549 Dorsalgia, unspecified: Secondary | ICD-10-CM | POA: Diagnosis present

## 2015-01-13 DIAGNOSIS — R109 Unspecified abdominal pain: Secondary | ICD-10-CM | POA: Diagnosis not present

## 2015-01-13 DIAGNOSIS — Z7951 Long term (current) use of inhaled steroids: Secondary | ICD-10-CM | POA: Insufficient documentation

## 2015-01-13 DIAGNOSIS — Z880 Allergy status to penicillin: Secondary | ICD-10-CM | POA: Insufficient documentation

## 2015-01-13 LAB — COMPREHENSIVE METABOLIC PANEL
ALT: 12 U/L — AB (ref 14–54)
AST: 24 U/L (ref 15–41)
Albumin: 3.9 g/dL (ref 3.5–5.0)
Alkaline Phosphatase: 111 U/L (ref 38–126)
Anion gap: 10 (ref 5–15)
BUN: 7 mg/dL (ref 6–20)
CHLORIDE: 101 mmol/L (ref 101–111)
CO2: 28 mmol/L (ref 22–32)
CREATININE: 0.64 mg/dL (ref 0.44–1.00)
Calcium: 9.2 mg/dL (ref 8.9–10.3)
GFR calc Af Amer: >60 mL/min (ref >60)
GFR calc non Af Amer: >60 mL/min (ref >60)
Glucose, Bld: 98 mg/dL (ref 65–99)
Potassium: 4.2 mmol/L (ref 3.5–5.1)
Sodium: 139 mmol/L (ref 135–145)
Total Bilirubin: 0.4 mg/dL (ref 0.3–1.2)
Total Protein: 6.8 g/dL (ref 6.5–8.1)

## 2015-01-13 LAB — URINALYSIS, ROUTINE W REFLEX MICROSCOPIC
Bilirubin Urine: NEGATIVE
GLUCOSE, UA: NEGATIVE mg/dL
HGB URINE DIPSTICK: NEGATIVE
Ketones, ur: NEGATIVE mg/dL
Leukocytes, UA: NEGATIVE
Nitrite: NEGATIVE
PH: 7 (ref 5.0–8.0)
Protein, ur: NEGATIVE mg/dL
SPECIFIC GRAVITY, URINE: 1.006 (ref 1.005–1.030)
Urobilinogen, UA: 0.2 mg/dL (ref 0.0–1.0)

## 2015-01-13 LAB — CBC WITH DIFFERENTIAL/PLATELET
Basophils Absolute: 0 10*3/uL (ref 0.0–0.1)
Basophils Relative: 0 %
EOS PCT: 2 %
Eosinophils Absolute: 0.2 10*3/uL (ref 0.0–0.7)
HCT: 40.5 % (ref 36.0–46.0)
Hemoglobin: 13.7 g/dL (ref 12.0–15.0)
LYMPHS PCT: 38 %
Lymphs Abs: 3.6 10*3/uL (ref 0.7–4.0)
MCH: 30.4 pg (ref 26.0–34.0)
MCHC: 33.8 g/dL (ref 30.0–36.0)
MCV: 89.8 fL (ref 78.0–100.0)
MONO ABS: 0.6 10*3/uL (ref 0.1–1.0)
MONOS PCT: 7 %
Neutro Abs: 5.1 10*3/uL (ref 1.7–7.7)
Neutrophils Relative %: 53 %
Platelets: 252 10*3/uL (ref 150–400)
RBC: 4.51 MIL/uL (ref 3.87–5.11)
RDW: 13.3 % (ref 11.5–15.5)
WBC: 9.4 10*3/uL (ref 4.0–10.5)

## 2015-01-13 LAB — LIPASE, BLOOD: Lipase: 27 U/L (ref 22–51)

## 2015-01-13 MED ORDER — SODIUM CHLORIDE 0.9 % IV BOLUS (SEPSIS)
1000.0000 mL | Freq: Once | INTRAVENOUS | Status: AC
  Administered 2015-01-13: 1000 mL via INTRAVENOUS

## 2015-01-13 MED ORDER — DIAZEPAM 2 MG PO TABS
2.0000 mg | ORAL_TABLET | Freq: Once | ORAL | Status: AC
  Administered 2015-01-13: 2 mg via ORAL
  Filled 2015-01-13: qty 1

## 2015-01-13 MED ORDER — IBUPROFEN 800 MG PO TABS: 800.0000 mg | ORAL_TABLET | Freq: Once | ORAL | Status: DC

## 2015-01-13 MED ORDER — OXYCODONE HCL 5 MG PO TABS
5.0000 mg | ORAL_TABLET | Freq: Once | ORAL | Status: AC
  Administered 2015-01-13: 5 mg via ORAL
  Filled 2015-01-13: qty 1

## 2015-01-13 MED ORDER — KETOROLAC TROMETHAMINE 30 MG/ML IJ SOLN
30.0000 mg | Freq: Once | INTRAMUSCULAR | Status: AC
  Administered 2015-01-13: 30 mg via INTRAVENOUS
  Filled 2015-01-13: qty 1

## 2015-01-13 MED ORDER — ONDANSETRON HCL 4 MG/2ML IJ SOLN
4.0000 mg | Freq: Once | INTRAMUSCULAR | Status: AC
  Administered 2015-01-13: 4 mg via INTRAVENOUS
  Filled 2015-01-13: qty 2

## 2015-01-13 NOTE — ED Notes (Signed)
From home via EMS, c/o right sided flank pain and HA X2-3 days, ambulatory, A/O X4, NAD

## 2015-01-13 NOTE — ED Provider Notes (Signed)
CSN: [id number]     Arrival date & time 01/13/15  1313 History   First MD Initiated Contact with Patient 01/13/15 1319     Chief Complaint  Patient presents with  . Flank Pain     (Consider location/radiation/quality/duration/timing/severity/associated sxs/prior Treatment) Patient is a 39 y.o. female presenting with general illness. The history is provided by the patient.  Illness Severity:  Severe Onset quality:  Gradual Duration:  12 months Timing:  Intermittent Progression:  Waxing and waning Chronicity:  Chronic Associated symptoms: myalgias   Associated symptoms: no chest pain, no congestion, no fever, no headaches, no nausea, no rhinorrhea, no shortness of breath, no vomiting and no wheezing    Patient is a 39 y.o. female who presents with right sided pain.  This started about a year ago.  Patient was in a mechanical injury where she chronically has pain from her head to her feet.  Right sided.  [place] about a week ago and felt like it has worsened.  Patients physician will no longer prescribe her narcotics.  Patient has called them and tried to set up an appointment but has difficulty getting transportation.    Past Medical History  Diagnosis Date  . Anxiety   . Back pain    History reviewed. No pertinent past surgical history. No family history on file. Social History  Substance Use Topics  . Smoking status: Former Smoker    Quit date: 12/02/2013  . Smokeless tobacco: None  . Alcohol Use: No   OB History    No data available     Review of Systems  Constitutional: Negative for fever and chills.  HENT: Negative for congestion and rhinorrhea.   Eyes: Negative for redness and visual disturbance.  Respiratory: Negative for shortness of breath and wheezing.   Cardiovascular: Negative for chest pain and palpitations.  Gastrointestinal: Negative for nausea and vomiting.  Genitourinary: Negative for dysuria and urgency.  Musculoskeletal: Positive for myalgias and back  pain. Negative for arthralgias.  Skin: Negative for pallor and wound.  Neurological: Negative for dizziness and headaches.      Allergies  Divalproex sodium; Lithium; Propoxyphene; Zolpidem tartrate; Amoxicillin; Augmentin; Chantix; and Codeine  Home Medications   Prior to Admission medications   Medication Sig Start Date End Date Taking? Authorizing Provider  acetaminophen (TYLENOL) 500 MG tablet Take 1,000 mg by mouth every 6 (six) hours as needed for mild pain.   Yes Historical Provider, MD  ALPRAZolam Duanne Moron) 1 MG tablet Take 1 mg by mouth 3 (three) times daily. 03/02/14  Yes Historical Provider, MD  cyclobenzaprine (FLEXERIL) 10 MG tablet Take 1 tablet (10 mg total) by mouth 2 (two) times daily as needed for muscle spasms. 04/01/14  Yes Nat Christen, MD  HYDROcodone-acetaminophen [facility]) 10-325 MG per tablet Take 1 tablet by mouth 4 (four) times daily. 02/27/14  Yes Historical Provider, MD  ibuprofen (ADVIL,MOTRIN) 200 MG tablet Take 400 mg by mouth every 6 (six) hours as needed for moderate pain.   Yes Historical Provider, MD  montelukast (SINGULAIR) 10 MG tablet Take 10 mg by mouth at bedtime. 03/14/14  Yes Historical Provider, MD  oxyCODONE-acetaminophen (PERCOCET) 5-325 MG per tablet Take 2 tablets by mouth every 4 (four) hours as needed. Patient not taking: Reported on 01/13/2015 04/01/14   Nat Christen, MD  PROAIR HFA 108 (90 BASE) MCG/ACT inhaler Inhale 2 puffs into the lungs every 6 (six) hours as needed. 02/16/14   Historical Provider, MD  triamcinolone (NASACORT) 55 MCG/ACT AERO nasal inhaler  Place 2 sprays into the nose daily.    Historical Provider, MD   BP 157/95 mmHg  Pulse 108  Temp(Src) 98.3 F (36.8 C)  Resp 22  Ht 5\' 1"  (1.549 m)  Wt 120 lb (54.432 kg)  BMI 22.69 kg/m2  SpO2 99% Physical Exam  Constitutional: She is oriented to person, place, and time. She appears well-developed and well-nourished. No distress.  HENT:  Head: Normocephalic and atraumatic.   Eyes: EOM are normal. Pupils are equal, round, and reactive to light.  Neck: Normal range of motion. Neck supple.  Cardiovascular: Normal rate and regular rhythm.  Exam reveals no gallop and no friction rub.   No murmur heard. Pulmonary/Chest: Effort normal. She has no wheezes. She has no rales.  Abdominal: Soft. She exhibits no distension. There is no tenderness. There is no rebound and no guarding.  Musculoskeletal: She exhibits tenderness (TTP worst to the right piriformis). She exhibits no edema.  Neurological: She is alert and oriented to person, place, and time.  Skin: Skin is warm and dry. She is not diaphoretic.  Psychiatric: She has a normal mood and affect. Her behavior is normal.    ED Course  Procedures (including critical care time) Labs Review Labs Reviewed  COMPREHENSIVE METABOLIC PANEL - Abnormal; Notable for the following:    ALT 12 (*)    All other components within normal limits  URINALYSIS, ROUTINE W REFLEX MICROSCOPIC (NOT AT [facility])  CBC WITH DIFFERENTIAL/PLATELET  LIPASE, BLOOD    Imaging Review No results found. I have personally reviewed and evaluated these images and lab results as part of my medical decision-making.   EKG Interpretation None      MDM   Final diagnoses:  Piriformis syndrome of right side    39 yo F with chronic pain, worsening over past week or so post fall.  R sided sciatica appears to be worst area of pain, piriformis syndrome, will treat as such.  No narcotics as patient seems high risk for addiction and showing some drug seeking behavior.  Social work evaluated for possible help with transportation, appears that [place] called EMS [place] so social work unable to help.  This is also concerning for drug seeking, patient irate at not given narcotics, suggested she follow up with her PCP.   I have discussed the diagnosis/risks/treatment options with the patient and believe the pt to be eligible for  discharge home to follow-up with PCP. We also discussed returning to the ED immediately if new or worsening sx occur. We discussed the sx which are most concerning (e.g., sudden worsening pain, fever, cauda equina) that necessitate immediate return. Medications administered to the patient during their visit and any new prescriptions provided to the patient are listed below.  Medications given during this visit Medications  sodium chloride 0.9 % bolus 1,000 mL (0 mLs Intravenous Stopped 01/13/15 1519)  ondansetron (ZOFRAN) injection 4 mg (4 mg Intravenous Given 01/13/15 1355)  diazepam (VALIUM) tablet 2 mg (2 mg Oral Given 01/13/15 1507)  ketorolac (TORADOL) 30 MG/ML injection 30 mg (30 mg Intravenous Given 01/13/15 1507)  oxyCODONE (Oxy IR/ROXICODONE) immediate release tablet 5 mg (5 mg Oral Given 01/13/15 1507)    Discharge Medication List as of 01/13/2015  2:57 PM      The patient appears reasonably screen and/or stabilized for discharge and I doubt any other medical condition or other [facility] requiring further screening, evaluation, or treatment in the ED at this time prior  to discharge.      Deno Etienne, DO 01/14/15 1129

## 2015-01-13 NOTE — Discharge Instructions (Signed)
Take 4 over the counter ibuprofen tablets 3 times a day or 2 over-the-counter naproxen tablets twice a day for pain.  Do each exercise 3 times a day 3 times  Piriformis Syndrome With Rehab Piriformis syndrome is a condition the affects the nervous system in the area of the hip, and is characterized by pain and possibly a loss of feeling in the backside (posterior) thigh that may extend down the entire length of the leg. The symptoms are caused by an increase in pressure on the sciatic nerve by the piriformis muscle, which is on the back of the hip and is responsible for externally rotating the hip. The sciatic nerve and its branches connect to much of the leg. Normally the sciatic nerve runs between the piriformis muscle and other muscles. However, in certain individuals the nerve runs through the muscle, which causes an increase in pressure on the nerve and results in the symptoms of piriformis syndrome. SYMPTOMS   Pain, tingling, numbness, or burning in the back of the thigh that may also extend down the entire leg.  Occasionally, tenderness in the buttock.  Loss of function of the leg.  Pain that worsens when using the piriformis muscle (running, jumping, or stairs).  Pain that increases with prolonged sitting.  Pain that is lessened by lying flat on the back. CAUSES   Piriformis syndrome is the result of an increase in pressure placed on the sciatic nerve. Oftentimes, piriformis syndrome is an overuse injury.  Stress placed on the nerve from a sudden increase in the intensity, frequency, or duration of training.  Compensation of other extremity injuries. RISK INCREASES WITH:  Sports that involve the piriformis muscle (running, walking, or jumping).  You are born with (congenital) a defect in which the sciatic nerve passes through the muscle. PREVENTION  Warm up and stretch properly before activity.  Allow for adequate recovery between workouts.  Maintain physical  fitness:  Strength, flexibility, and endurance.  Cardiovascular fitness. PROGNOSIS  If treated properly, the symptoms of piriformis syndrome usually resolve in 2 to 6 weeks. RELATED COMPLICATIONS   Persistent and possibly permanent pain and numbness in the lower extremity.  Weakness of the extremity that may progress to disability and inability to compete. TREATMENT  The most effective treatment for piriformis syndrome is rest from any activities that aggravate the symptoms. Ice and pain medication may help reduce pain and inflammation. The use of strengthening and stretching exercises may help reduce pain with activity. These exercises may be performed at home or with a therapist. A referral to a therapist may be given for further evaluation and treatment, such as ultrasound. Corticosteroid injections may be given to reduce inflammation that is causing pressure to be placed on the sciatic nerve. If nonsurgical (conservative) treatment is unsuccessful, then surgery may be recommended.  MEDICATION   If pain medication is necessary, then nonsteroidal anti-inflammatory medications, such as aspirin and ibuprofen, or other minor pain relievers, such as acetaminophen, are often recommended.  Do not take pain medication for 7 days before surgery.  Prescription pain relievers may be given if deemed necessary by your caregiver. Use only as directed and only as much as you need.  Corticosteroid injections may be given by your caregiver. These injections should be reserved for the most serious cases, because they may only be given a certain number of times. HEAT AND COLD:   Cold treatment (icing) relieves pain and reduces inflammation. Cold treatment should be applied for 10 to 15 minutes every 2  to 3 hours for inflammation and pain and immediately after any activity that aggravates your symptoms. Use ice packs or massage the area with a piece of ice (ice massage).  Heat treatment may be used prior  to performing the stretching and strengthening activities prescribed by your caregiver, physical therapist, or athletic trainer. Use a heat pack or soak the injury in warm water. SEEK IMMEDIATE MEDICAL CARE IF:  Treatment seems to offer no benefit, or the condition worsens.  Any medications produce adverse side effects. EXERCISES RANGE OF MOTION (ROM) AND STRETCHING EXERCISES - Piriformis Syndrome These exercises may help you when beginning to rehabilitate your injury. Your symptoms may resolve with or without further involvement from your physician, physical therapist, or athletic trainer. While completing these exercises, remember:   Restoring tissue flexibility helps normal motion to return to the joints. This allows healthier, less painful movement and activity.  An effective stretch should be held for at least 30 seconds.  A stretch should never be painful. You should only feel a gentle lengthening or release in the stretched tissue. STRETCH - Hip Rotators  Lie on your back on a firm surface. Grasp your right / left knee with your right / left hand and your ankle with your opposite hand.  Keeping your hips and shoulders firmly planted, gently pull your right / left knee and rotate your lower leg toward your opposite shoulder until you feel a stretch in your buttocks.  Hold this stretch for __________ seconds. Repeat this stretch __________ times. Complete this stretch __________ times per day. STRETCH - Iliotibial Band  On the floor or bed, lie on your side so your right / left leg is on top. Bend your knee and grab your ankle.  Slowly bring your knee back so that your thigh is in line with your trunk. Keep your heel at your buttocks and gently arch your back so your head, shoulders, and hips line up.  Slowly lower your leg so that your knee approaches the floor/bed until you feel a gentle stretch on the outside of your right / left thigh. If you do not feel a stretch and your knee  will not fall farther, place the heel of your opposite foot on top of your knee and pull your thigh down farther.  Hold this stretch for __________ seconds. Repeat __________ times. Complete __________ times per day. STRENGTHENING EXERCISES - Piriformis Syndrome  These are some of the caregiver again or until your symptoms are resolved. Remember:   Strong muscles with good endurance tolerate stress better.  Do the exercises as initially prescribed by your caregiver. Progress slowly with each exercise, gradually increasing the number of repetitions and weight used under their guidance. STRENGTH - Hip Abductors, Straight Leg Raises Be aware of your form throughout the entire exercise so that you exercise the correct muscles. Sloppy form means that you are not strengthening the correct muscles.  Lie on your side so that your head, shoulders, knee, and hip line up. You may bend your lower knee to help maintain your balance. Your right / left leg should be on top.  Roll your hips slightly forward, so that your hips are stacked directly over each other and your right / left knee is facing forward.  Lift your top leg up 4-6 inches, leading with your heel. Be sure that your foot does not drift forward or that your knee does not roll toward the ceiling.  Hold this position for __________ seconds. You should feel  the muscles in your outer hip lifting (you may not notice this until your leg begins to tire).  Slowly lower your leg to the starting position. Allow the muscles to fully relax before beginning the next repetition. Repeat __________ times. Complete this exercise __________ times per day.  STRENGTH - Hip Abductors, Quadruped  On a firm, lightly padded surface, position yourself on your hands and knees. Your hands should be directly below your shoulders and your knees should be directly below your hips.  Keeping your right / left knee bent, lift your leg out to the side. Keep your legs level  and in line with your shoulders.  Position yourself on your hands and knees.  Hold for __________ seconds.  Keeping your trunk steady and your hips level, slowly lower your leg to the starting position. Repeat __________ times. Complete this exercise __________ times per day.  STRENGTH - Hip Abductors, Standing  Tie one end of a rubber exercise band/tubing to a secure surface (table, pole) and tie a loop at the other end.  Place the loop around your right / left ankle. Keeping your ankle with the band directly opposite of the secured end, step away until there is tension in the tube/band.  Hold onto a chair as needed for balance.  Keeping your back upright, your shoulders over your hips, and your toes pointing forward, lift your right / left leg out to your side. Be sure to lift your leg with your hip muscles. Do not "throw" your leg or tip your body to lift your leg.  Slowly and with control, return to the starting position. Repeat exercise __________ times. Complete this exercise __________ times per day.    This information is not intended to replace advice given to you by your health care provider. Make sure you discuss any questions you have with your health care provider.   Document Released: 03/20/2005 Document Revised: 08/04/2014 Document Reviewed: 07/02/2008 Elsevier Interactive Patient Education [employer].

## 2015-01-25 ENCOUNTER — Ambulatory Visit: Payer: Medicare Other | Admitting: Neurology

## 2015-01-25 ENCOUNTER — Telehealth: Payer: Self-pay | Admitting: *Deleted

## 2015-01-25 NOTE — Telephone Encounter (Signed)
no showed new patient appointment

## 2015-02-01 ENCOUNTER — Ambulatory Visit (INDEPENDENT_AMBULATORY_CARE_PROVIDER_SITE_OTHER): Payer: Self-pay | Admitting: Neurology

## 2015-02-01 ENCOUNTER — Telehealth: Payer: Self-pay | Admitting: *Deleted

## 2015-02-01 DIAGNOSIS — W19XXXA Unspecified fall, initial encounter: Secondary | ICD-10-CM

## 2015-02-01 NOTE — Telephone Encounter (Signed)
no showed new patient appointment

## 2015-02-02 ENCOUNTER — Encounter: Payer: Self-pay | Admitting: Neurology

## 2015-02-08 NOTE — Progress Notes (Signed)
No show

## 2015-03-04 ENCOUNTER — Other Ambulatory Visit: Payer: Self-pay | Admitting: Physical Medicine and Rehabilitation

## 2015-03-04 DIAGNOSIS — M5441 Lumbago with sciatica, right side: Secondary | ICD-10-CM

## 2015-03-11 ENCOUNTER — Ambulatory Visit
Admission: RE | Admit: 2015-03-11 | Discharge: 2015-03-11 | Disposition: A | Discharge status: Home / Self Care | Payer: Medicare Other | Source: Ambulatory Visit | Attending: Physical Medicine and Rehabilitation | Admitting: Physical Medicine and Rehabilitation

## 2015-03-11 DIAGNOSIS — M5441 Lumbago with sciatica, right side: Secondary | ICD-10-CM

## 2015-03-11 MED ORDER — IOPAMIDOL (ISOVUE-370) INJECTION 76%
75.0000 mL | Freq: Once | INTRAVENOUS | Status: AC | PRN
  Administered 2015-03-11: 75 mL via INTRAVENOUS

## 2015-09-03 DIAGNOSIS — I10 Essential (primary) hypertension: Secondary | ICD-10-CM

## 2015-09-03 HISTORY — DX: Essential (primary) hypertension: I10

## 2016-02-07 ENCOUNTER — Emergency Department (HOSPITAL_BASED_OUTPATIENT_CLINIC_OR_DEPARTMENT_OTHER): Payer: Medicare Other

## 2016-02-07 ENCOUNTER — Encounter (HOSPITAL_BASED_OUTPATIENT_CLINIC_OR_DEPARTMENT_OTHER): Payer: Self-pay | Admitting: *Deleted

## 2016-02-07 ENCOUNTER — Emergency Department (HOSPITAL_BASED_OUTPATIENT_CLINIC_OR_DEPARTMENT_OTHER)
Admission: EM | Admit: 2016-02-07 | Discharge: 2016-02-07 | Disposition: A | Discharge status: Home / Self Care | Payer: Medicare Other | Attending: Emergency Medicine | Admitting: Emergency Medicine

## 2016-02-07 DIAGNOSIS — K529 Noninfective gastroenteritis and colitis, unspecified: Secondary | ICD-10-CM

## 2016-02-07 DIAGNOSIS — R109 Unspecified abdominal pain: Secondary | ICD-10-CM

## 2016-02-07 DIAGNOSIS — Z79899 Other long term (current) drug therapy: Secondary | ICD-10-CM | POA: Insufficient documentation

## 2016-02-07 DIAGNOSIS — Z87891 Personal history of nicotine dependence: Secondary | ICD-10-CM | POA: Diagnosis not present

## 2016-02-07 HISTORY — DX: Irritable bowel syndrome without diarrhea: K58.9

## 2016-02-07 LAB — CBC WITH DIFFERENTIAL/PLATELET
BASOS PCT: 0 %
Basophils Absolute: 0 10*3/uL (ref 0.0–0.1)
EOS ABS: 0.1 10*3/uL (ref 0.0–0.7)
Eosinophils Relative: 1 %
HCT: 38.5 % (ref 36.0–46.0)
HEMOGLOBIN: 12.7 g/dL (ref 12.0–15.0)
Lymphocytes Relative: 38 %
Lymphs Abs: 3 10*3/uL (ref 0.7–4.0)
MCH: 30.8 pg (ref 26.0–34.0)
MCHC: 33 g/dL (ref 30.0–36.0)
MCV: 93.4 fL (ref 78.0–100.0)
Monocytes Absolute: 0.7 10*3/uL (ref 0.1–1.0)
Monocytes Relative: 8 %
Neutro Abs: 4.3 10*3/uL (ref 1.7–7.7)
Neutrophils Relative %: 53 %
PLATELETS: 299 10*3/uL (ref 150–400)
RBC: 4.12 MIL/uL (ref 3.87–5.11)
RDW: 13.2 % (ref 11.5–15.5)
WBC: 8.1 10*3/uL (ref 4.0–10.5)

## 2016-02-07 LAB — COMPREHENSIVE METABOLIC PANEL
ALBUMIN: 4.3 g/dL (ref 3.5–5.0)
ALK PHOS: 120 U/L (ref 38–126)
ALT: 11 U/L — ABNORMAL LOW (ref 14–54)
AST: 21 U/L (ref 15–41)
Anion gap: 9 (ref 5–15)
BUN: 8 mg/dL (ref 6–20)
CHLORIDE: 105 mmol/L (ref 101–111)
CO2: 26 mmol/L (ref 22–32)
Calcium: 9.3 mg/dL (ref 8.9–10.3)
Creatinine, Ser: 0.74 mg/dL (ref 0.44–1.00)
GFR calc Af Amer: >60 mL/min (ref >60)
GFR calc non Af Amer: >60 mL/min (ref >60)
GLUCOSE: 106 mg/dL — AB (ref 65–99)
Potassium: 3.9 mmol/L (ref 3.5–5.1)
SODIUM: 140 mmol/L (ref 135–145)
Total Bilirubin: 0.3 mg/dL (ref 0.3–1.2)
Total Protein: 8.1 g/dL (ref 6.5–8.1)

## 2016-02-07 LAB — URINALYSIS, ROUTINE W REFLEX MICROSCOPIC
BILIRUBIN URINE: NEGATIVE
Glucose, UA: NEGATIVE mg/dL
Hgb urine dipstick: NEGATIVE
KETONES UR: NEGATIVE mg/dL
Leukocytes, UA: NEGATIVE
Nitrite: NEGATIVE
PH: 6 (ref 5.0–8.0)
Protein, ur: NEGATIVE mg/dL
Specific Gravity, Urine: 1.004 — ABNORMAL LOW (ref 1.005–1.030)

## 2016-02-07 MED ORDER — MORPHINE SULFATE (PF) 4 MG/ML IV SOLN
4.0000 mg | Freq: Once | INTRAVENOUS | Status: AC
  Administered 2016-02-07: 4 mg via INTRAVENOUS
  Filled 2016-02-07: qty 1

## 2016-02-07 MED ORDER — ONDANSETRON HCL 4 MG/2ML IJ SOLN
4.0000 mg | Freq: Once | INTRAMUSCULAR | Status: AC
  Administered 2016-02-07: 4 mg via INTRAVENOUS

## 2016-02-07 MED ORDER — CIPROFLOXACIN HCL 500 MG PO TABS: 500.0000 mg | ORAL_TABLET | Freq: Two times a day (BID) | ORAL | 0 refills | Status: AC

## 2016-02-07 MED ORDER — SODIUM CHLORIDE 0.9 % IV BOLUS (SEPSIS)
1000.0000 mL | Freq: Once | INTRAVENOUS | Status: AC
  Administered 2016-02-07: 1000 mL via INTRAVENOUS

## 2016-02-07 MED ORDER — METRONIDAZOLE 500 MG PO TABS: 500.0000 mg | ORAL_TABLET | Freq: Two times a day (BID) | ORAL | 0 refills | Status: DC

## 2016-02-07 MED ORDER — FLUCONAZOLE 200 MG PO TABS: 200.0000 mg | ORAL_TABLET | Freq: Every day | ORAL | 0 refills | Status: AC

## 2016-02-07 MED ORDER — ONDANSETRON HCL 4 MG/2ML IJ SOLN
INTRAMUSCULAR | Status: AC
  Filled 2016-02-07: qty 2

## 2016-02-07 MED ORDER — POLYETHYLENE GLYCOL 3350 17 G PO PACK: 17.0000 g | PACK | Freq: Every day | ORAL | 0 refills | Status: DC

## 2016-02-07 MED ORDER — ONDANSETRON HCL 4 MG/2ML IJ SOLN
4.0000 mg | Freq: Once | INTRAMUSCULAR | Status: AC
  Administered 2016-02-07: 4 mg via INTRAVENOUS
  Filled 2016-02-07: qty 2

## 2016-02-07 MED ORDER — IOPAMIDOL (ISOVUE-300) INJECTION 61%
100.0000 mL | Freq: Once | INTRAVENOUS | Status: AC | PRN
  Administered 2016-02-07: 100 mL via INTRAVENOUS

## 2016-02-07 NOTE — Discharge Instructions (Signed)
You have been seen in the Emergency Department (ED) for abdominal pain.  Your CT scan showed evidence of constipation and possible developing infection. We are recommending antibiotics and continued constipation at home.   Please follow up as instructed above regarding today?s emergent visit and the symptoms that are bothering you.  Return to the ED if your abdominal pain worsens or fails to improve, you develop bloody vomiting, bloody diarrhea, you are unable to tolerate fluids due to vomiting, fever greater than 101, or other symptoms that concern you.

## 2016-02-07 NOTE — ED Notes (Signed)
ED Provider at bedside. 

## 2016-02-07 NOTE — ED Notes (Addendum)
Lonn Georgia, PA at bedside.

## 2016-02-07 NOTE — ED Provider Notes (Signed)
Blood pressure 139/91, pulse 100, temperature 98.4 F (36.9 C), temperature source Oral, resp. rate 16, height 5' 2.5" (1.588 m), weight 120 lb (54.4 kg), SpO2 97 %.  Assuming care from Dr. Venora Maples.  In short, [name] is a 40 y.o. female with a chief complaint of Abdominal Pain .  Refer to the original H&P for additional details.  The current plan of care is to follow CT abdomen/pelvis and reassess.  05:22 PM CT scan resulted and reviewed in detail with the patient. The patient is not having symptoms consistent with biliary duct dilation. Suspect that her symptoms are secondary to the large volume of formed stool in the rectum. Plan for continued constipation management at home. There is some evidence of perirectal fat stranding and possible colitis. Patient is not having fevers or chills. Rectal exam performed by previous provider with no evidence of severe impaction. Plan to give Cipro and Flagyl along with continued MiraLAX at home. We'll provide contact information for gastroenterology at discharge. Patient is tolerating PO prior to ED discharge. Discussed return precautions in detail with the patient.   Nanda Quinton, MD    Margette Fast, MD 02/07/16 [phone]

## 2016-02-07 NOTE — ED Triage Notes (Signed)
Abdominal pain and bloating. Hx of IBS. States she is constipated and not passing gas.

## 2016-02-07 NOTE — ED Provider Notes (Signed)
[facility] Provider Note   CSN: [id number] Arrival date & time: 02/07/16  1158     History   Chief Complaint Chief Complaint  Patient presents with  . Abdominal Pain    HPI [name] is a 40 y.o. female.  HPI Patient has a history of IBS reports no bowel movement and not able to pass gas as well as abdominal distention over the past 48 hours. No history of bowel obstruction.  She has had an abdominal hysterectomy.  She reports his symptoms today feel different than her RVSP she's tried hydrocodone at home for which takes for chronic back pain without improvement in her symptoms.  No dysuria or urinary frequency.  No fevers or chills.  Denies vomiting and hematemesis.  Reports mild nausea at this time   Past Medical History:  Diagnosis Date  . Anxiety   . Back pain   . IBS (irritable bowel syndrome)     There are no active problems to display for this patient.   Past Surgical History:  Procedure Laterality Date  . ABDOMINAL HYSTERECTOMY      OB History    No data available       Home Medications    Prior to Admission medications   Medication Sig Start Date End Date Taking? Authorizing Provider  acetaminophen (TYLENOL) 500 MG tablet Take 1,000 mg by mouth every 6 (six) hours as needed for mild pain.    Historical Provider, MD  ALPRAZolam Duanne Moron) 1 MG tablet Take 1 mg by mouth 3 (three) times daily. 03/02/14   Historical Provider, MD  cyclobenzaprine (FLEXERIL) 10 MG tablet Take 1 tablet (10 mg total) by mouth 2 (two) times daily as needed for muscle spasms. 04/01/14   Nat Christen, MD  HYDROcodone-acetaminophen [facility]) 10-325 MG per tablet Take 1 tablet by mouth 4 (four) times daily. 02/27/14   Historical Provider, MD  ibuprofen (ADVIL,MOTRIN) 200 MG tablet Take 400 mg by mouth every 6 (six) hours as needed for moderate pain.    Historical Provider, MD  montelukast (SINGULAIR) 10 MG tablet Take 10 mg by mouth at bedtime. 03/14/14   Historical  Provider, MD  oxyCODONE-acetaminophen (PERCOCET) 5-325 MG per tablet Take 2 tablets by mouth every 4 (four) hours as needed. Patient not taking: Reported on 01/13/2015 04/01/14   Nat Christen, MD  PROAIR HFA 108 (90 BASE) MCG/ACT inhaler Inhale 2 puffs into the lungs every 6 (six) hours as needed. 02/16/14   Historical Provider, MD  triamcinolone (NASACORT) 55 MCG/ACT AERO nasal inhaler Place 2 sprays into the nose daily.    Historical Provider, MD    Family History No family history on file.  Social History Social History  Substance Use Topics  . Smoking status: Former Smoker    Quit date: 12/02/2013  . Smokeless tobacco: Never Used  . Alcohol use No     Allergies   Divalproex sodium; Lithium; Propoxyphene; Zolpidem tartrate; Amoxicillin; Augmentin [amoxicillin-pot clavulanate]; Chantix [varenicline]; and Codeine   Review of Systems Review of Systems   Physical Exam Updated Vital Signs BP 131/85   Pulse 102   Temp 98.3 F (36.8 C) (Oral)   Resp 20   Ht 5' 2.5" (1.588 m)   Wt 120 lb (54.4 kg)   SpO2 98%   BMI 21.60 kg/m   Physical Exam  Constitutional: She is oriented to person, place, and time. She appears well-developed and well-nourished. No distress.  HENT:  Head: Normocephalic and atraumatic.  Eyes: EOM are normal.  Neck: Normal range of motion.  Cardiovascular: Normal rate, regular rhythm and normal heart sounds.   Pulmonary/Chest: Effort normal and breath sounds normal.  Abdominal: Soft. She exhibits no distension. There is no tenderness.  Neurological: She is alert and oriented to person, place, and time.  Skin: Skin is warm and dry.  Psychiatric: She has a normal mood and affect. Judgment normal.  Nursing note and vitals reviewed.    ED Treatments / Results  Labs (all labs ordered are listed, but only abnormal results are displayed) Labs Reviewed  URINALYSIS, ROUTINE W REFLEX MICROSCOPIC (NOT AT [facility]) - Abnormal; Notable for the following:        Result Value   Specific Gravity, Urine 1.004 (*)    All other components within normal limits    EKG  EKG Interpretation None       Radiology No results found.  Procedures Procedures (including critical care time)  Medications Ordered in ED Medications - No data to display   Initial Impression / Assessment and Plan / ED Course  I have reviewed the triage vital signs and the nursing notes.  Pertinent labs & imaging results that were available during my care of the patient were reviewed by me and considered in my medical decision making (see chart for details).  Clinical Course     CT scan to further evaluate the cause the patient's symptoms.  Currently represent partial small bowel obstruction given her history of abdominal hysterectomy.  Symptomatically treatment at this time.    Care to Dr Gara Kroner, to follow up on CT imaging  Final Clinical Impressions(s) / ED Diagnoses   Final diagnoses:  None    New Prescriptions New Prescriptions   No medications on file     Jola Schmidt, MD 02/07/16 1557

## 2016-02-07 NOTE — ED Notes (Signed)
Patient transported to CT 

## 2016-02-07 NOTE — ED Notes (Signed)
Patient transported to X-ray 

## 2016-03-05 ENCOUNTER — Emergency Department (HOSPITAL_COMMUNITY): Payer: Medicare Other

## 2016-03-05 ENCOUNTER — Observation Stay (HOSPITAL_COMMUNITY)
Admission: EM | Admit: 2016-03-05 | Discharge: 2016-03-08 | Disposition: A | Discharge status: Home / Self Care | Payer: Medicare Other | Attending: Internal Medicine | Admitting: Internal Medicine

## 2016-03-05 ENCOUNTER — Encounter (HOSPITAL_COMMUNITY): Payer: Self-pay | Admitting: Emergency Medicine

## 2016-03-05 DIAGNOSIS — G9340 Encephalopathy, unspecified: Secondary | ICD-10-CM | POA: Diagnosis present

## 2016-03-05 DIAGNOSIS — F319 Bipolar disorder, unspecified: Principal | ICD-10-CM | POA: Diagnosis present

## 2016-03-05 DIAGNOSIS — Z880 Allergy status to penicillin: Secondary | ICD-10-CM | POA: Diagnosis not present

## 2016-03-05 DIAGNOSIS — Z87891 Personal history of nicotine dependence: Secondary | ICD-10-CM | POA: Diagnosis not present

## 2016-03-05 DIAGNOSIS — G894 Chronic pain syndrome: Secondary | ICD-10-CM

## 2016-03-05 DIAGNOSIS — F419 Anxiety disorder, unspecified: Secondary | ICD-10-CM | POA: Diagnosis not present

## 2016-03-05 DIAGNOSIS — K589 Irritable bowel syndrome without diarrhea: Secondary | ICD-10-CM | POA: Insufficient documentation

## 2016-03-05 DIAGNOSIS — R000 Tachycardia, unspecified: Secondary | ICD-10-CM | POA: Diagnosis not present

## 2016-03-05 DIAGNOSIS — R4182 Altered mental status, unspecified: Secondary | ICD-10-CM

## 2016-03-05 DIAGNOSIS — R410 Disorientation, unspecified: Secondary | ICD-10-CM | POA: Diagnosis present

## 2016-03-05 DIAGNOSIS — R51 Headache: Secondary | ICD-10-CM | POA: Diagnosis not present

## 2016-03-05 DIAGNOSIS — G8929 Other chronic pain: Secondary | ICD-10-CM | POA: Diagnosis not present

## 2016-03-05 DIAGNOSIS — F29 Unspecified psychosis not due to a substance or known physiological condition: Secondary | ICD-10-CM | POA: Diagnosis not present

## 2016-03-05 DIAGNOSIS — Z79899 Other long term (current) drug therapy: Secondary | ICD-10-CM

## 2016-03-05 HISTORY — DX: Chronic pain syndrome: G89.4

## 2016-03-05 LAB — I-STAT BETA HCG BLOOD, ED (MC, WL, AP ONLY)

## 2016-03-05 LAB — COMPREHENSIVE METABOLIC PANEL
ALBUMIN: 4.5 g/dL (ref 3.5–5.0)
ALK PHOS: 116 U/L (ref 38–126)
ALT: 16 U/L (ref 14–54)
ANION GAP: 11 (ref 5–15)
AST: 30 U/L (ref 15–41)
BILIRUBIN TOTAL: 0.4 mg/dL (ref 0.3–1.2)
BUN: 5 mg/dL — ABNORMAL LOW (ref 6–20)
CALCIUM: 10.8 mg/dL — AB (ref 8.9–10.3)
CO2: 27 mmol/L (ref 22–32)
Chloride: 103 mmol/L (ref 101–111)
Creatinine, Ser: 0.83 mg/dL (ref 0.44–1.00)
GLUCOSE: 114 mg/dL — AB (ref 65–99)
POTASSIUM: 4.2 mmol/L (ref 3.5–5.1)
Sodium: 141 mmol/L (ref 135–145)
TOTAL PROTEIN: 8 g/dL (ref 6.5–8.1)

## 2016-03-05 LAB — RAPID URINE DRUG SCREEN, HOSP PERFORMED
Amphetamines: NOT DETECTED
Barbiturates: NOT DETECTED
Benzodiazepines: POSITIVE — AB
Cocaine: NOT DETECTED
Opiates: POSITIVE — AB
Tetrahydrocannabinol: NOT DETECTED

## 2016-03-05 LAB — URINALYSIS, ROUTINE W REFLEX MICROSCOPIC
Bilirubin Urine: NEGATIVE
Glucose, UA: NEGATIVE mg/dL
Hgb urine dipstick: NEGATIVE
Ketones, ur: NEGATIVE mg/dL
Leukocytes, UA: NEGATIVE
Nitrite: NEGATIVE
Protein, ur: NEGATIVE mg/dL
Specific Gravity, Urine: 1.007 (ref 1.005–1.030)
pH: 8 (ref 5.0–8.0)

## 2016-03-05 LAB — SALICYLATE LEVEL: Salicylate Lvl: <7 mg/dL (ref 2.8–30.0)

## 2016-03-05 LAB — ETHANOL: Alcohol, Ethyl (B): <5 mg/dL

## 2016-03-05 LAB — CBC
HEMATOCRIT: 41.1 % (ref 36.0–46.0)
HEMOGLOBIN: 14 g/dL (ref 12.0–15.0)
MCH: 31.5 pg (ref 26.0–34.0)
MCHC: 34.1 g/dL (ref 30.0–36.0)
MCV: 92.4 fL (ref 78.0–100.0)
Platelets: 239 10*3/uL (ref 150–400)
RBC: 4.45 MIL/uL (ref 3.87–5.11)
RDW: 13.5 % (ref 11.5–15.5)
WBC: 6.1 10*3/uL (ref 4.0–10.5)

## 2016-03-05 LAB — VITAMIN B12: VITAMIN B 12: 294 pg/mL (ref 180–914)

## 2016-03-05 LAB — TSH: TSH: 0.802 u[IU]/mL (ref 0.350–4.500)

## 2016-03-05 LAB — AMMONIA
AMMONIA: 49 umol/L — AB (ref 9–35)
AMMONIA: 22 umol/L (ref 9–35)

## 2016-03-05 LAB — ACETAMINOPHEN LEVEL

## 2016-03-05 LAB — T4, FREE: FREE T4: 0.55 ng/dL — AB (ref 0.61–1.12)

## 2016-03-05 MED ORDER — POLYETHYLENE GLYCOL 3350 17 G PO PACK: 17.0000 g | PACK | Freq: Every day | ORAL | Status: DC | PRN

## 2016-03-05 MED ORDER — TRAZODONE HCL 50 MG PO TABS: 50.0000 mg | ORAL_TABLET | Freq: Every day | ORAL | Status: DC

## 2016-03-05 MED ORDER — ONDANSETRON HCL 4 MG PO TABS: 4.0000 mg | ORAL_TABLET | Freq: Four times a day (QID) | ORAL | Status: DC | PRN

## 2016-03-05 MED ORDER — CITALOPRAM HYDROBROMIDE 20 MG PO TABS
20.0000 mg | ORAL_TABLET | Freq: Every day | ORAL | Status: DC
  Administered 2016-03-06 – 2016-03-08 (×3): 20 mg via ORAL
  Filled 2016-03-05 (×3): qty 1

## 2016-03-05 MED ORDER — LISINOPRIL 10 MG PO TABS
10.0000 mg | ORAL_TABLET | Freq: Every day | ORAL | Status: DC
  Administered 2016-03-06 – 2016-03-08 (×3): 10 mg via ORAL
  Filled 2016-03-05 (×3): qty 1

## 2016-03-05 MED ORDER — ACETAMINOPHEN 650 MG RE SUPP: 650.0000 mg | Freq: Four times a day (QID) | RECTAL | Status: DC | PRN

## 2016-03-05 MED ORDER — ONDANSETRON HCL 4 MG/2ML IJ SOLN: 4.0000 mg | Freq: Four times a day (QID) | INTRAMUSCULAR | Status: DC | PRN

## 2016-03-05 MED ORDER — ONDANSETRON HCL 4 MG/2ML IJ SOLN: 4.0000 mg | Freq: Once | INTRAMUSCULAR | Status: DC

## 2016-03-05 MED ORDER — IBUPROFEN 200 MG PO TABS
600.0000 mg | ORAL_TABLET | Freq: Four times a day (QID) | ORAL | Status: DC | PRN
  Administered 2016-03-05 – 2016-03-08 (×5): 600 mg via ORAL
  Filled 2016-03-05 (×5): qty 3

## 2016-03-05 MED ORDER — TRAZODONE HCL 100 MG PO TABS
100.0000 mg | ORAL_TABLET | Freq: Every day | ORAL | Status: DC
  Administered 2016-03-06 – 2016-03-07 (×2): 100 mg via ORAL
  Filled 2016-03-05 (×2): qty 1

## 2016-03-05 MED ORDER — ACETAMINOPHEN 325 MG PO TABS
650.0000 mg | ORAL_TABLET | Freq: Four times a day (QID) | ORAL | Status: DC | PRN
  Administered 2016-03-06 – 2016-03-08 (×4): 650 mg via ORAL
  Filled 2016-03-05 (×4): qty 2

## 2016-03-05 MED ORDER — KETOROLAC TROMETHAMINE 30 MG/ML IJ SOLN: 30.0000 mg | Freq: Four times a day (QID) | INTRAMUSCULAR | Status: DC | PRN

## 2016-03-05 MED ORDER — ACETAMINOPHEN 500 MG PO TABS
1000.0000 mg | ORAL_TABLET | Freq: Once | ORAL | Status: AC
  Administered 2016-03-05: 1000 mg via ORAL
  Filled 2016-03-05: qty 2

## 2016-03-05 MED ORDER — ENOXAPARIN SODIUM 40 MG/0.4ML ~~LOC~~ SOLN
40.0000 mg | SUBCUTANEOUS | Status: DC
  Administered 2016-03-07 – 2016-03-08 (×2): 40 mg via SUBCUTANEOUS
  Filled 2016-03-05 (×3): qty 0.4

## 2016-03-05 MED ORDER — QUETIAPINE FUMARATE 50 MG PO TABS
100.0000 mg | ORAL_TABLET | Freq: Every day | ORAL | Status: DC
Start: 2016-03-05 — End: 2016-03-08
  Administered 2016-03-05 – 2016-03-07 (×3): 100 mg via ORAL
  Filled 2016-03-05 (×3): qty 2

## 2016-03-05 MED ORDER — ONDANSETRON 4 MG PO TBDP
4.0000 mg | ORAL_TABLET | Freq: Once | ORAL | Status: AC
  Administered 2016-03-05: 4 mg via ORAL
  Filled 2016-03-05: qty 1

## 2016-03-05 NOTE — ED Notes (Signed)
Pt c/o noise outside of room.  This RN explained to pt that RNs were in shift change, explained what shift change was, and that the noise would decrease soon.  Pt declined offer of turning on television to drown out noise.  Pt also c/o nausea and pain.  Stimuli reduced in environment.
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
## 2016-03-05 NOTE — ED Triage Notes (Signed)
Pt knows her name, year, and birthday but does not know how old she is. Pt having difficulty answering questions.

## 2016-03-05 NOTE — ED Notes (Signed)
EDP Ron Parker informed of pt's current state (see previous blank note); plan to admit patient; sedation has been deferred at this time since pt is staying in bed.

## 2016-03-05 NOTE — ED Provider Notes (Signed)
Level V caveat altered mental status. I attempted to call patient's home number. Number has been disconnected. Patient presents with altered mental status. She does not know month or year. She reports that she does feel confused. She complains of upper back pain for "years" she also complained of crampy pain in bilateral lower extremities earlier today which has since resolved. On exam patient is alert, follows simple commands, moves all extremities. Oriented to name and hospital does not know month or year. No asterixis. DTRs symmetric bilaterally at knee jerk ankle jerk and biceps toes downward going bilaterally. Heart regular rate and rhythm abdomen nondistended nontender. Entire spine is nontender. Skin warm and dry no rash.   Orlie Dakin, MD 03/06/16 [phone]

## 2016-03-05 NOTE — ED Notes (Signed)
Patient laying on stretcher, responding to internal stimuli.

## 2016-03-05 NOTE — ED Notes (Addendum)
Attempted report x1.  Name of nurse taken down.  Told to call Phil back in "5 minutes" at [phone].

## 2016-03-05 NOTE — ED Notes (Signed)
Went in to pt's room and pt was found to be out of bed and confused and thinking family was out in the hallway. This NT and Annabell Sabal were able to get pt back in bed and back on the monitor without incident.
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
## 2016-03-05 NOTE — ED Notes (Signed)
[facility] family member [phone]

## 2016-03-05 NOTE — ED Provider Notes (Signed)
[place] DEPT Provider Note   CSN: [id number] Arrival date & time: 03/05/16  1706     History   Chief Complaint Chief Complaint  Patient presents with  . Extremity Pain  . Altered Mental Status    HPI [name] is a 40 y.o. female.   Extremity Pain  This is a new problem. Episode onset: unknown. The problem occurs constantly. The problem has not changed since onset.Nothing aggravates the symptoms. Nothing relieves the symptoms. She has tried nothing for the symptoms. The treatment provided no relief.  Altered Mental Status      Past Medical History:  Diagnosis Date  . Anxiety   . Back pain   . IBS (irritable bowel syndrome)     Patient Active Problem List   Diagnosis Date Noted  . Acute encephalopathy 03/05/2016  . Polypharmacy 03/05/2016  . Chronic pain 03/05/2016  . Bipolar disorder ([place]) 03/05/2016  . Acute delirium 03/05/2016    Past Surgical History:  Procedure Laterality Date  . ABDOMINAL HYSTERECTOMY      OB History    No data available       Home Medications    Prior to Admission medications   Medication Sig Start Date End Date Taking? Authorizing Provider  ALPRAZolam Duanne Moron) 1 MG tablet Take 1 mg by mouth 3 (three) times daily. 03/02/14  Yes Historical Provider, MD  cyclobenzaprine (FLEXERIL) 10 MG tablet Take 1 tablet (10 mg total) by mouth 2 (two) times daily as needed for muscle spasms. 04/01/14  Yes Nat Christen, MD  DiphenhydrAMINE HCl, Sleep, (UNISOM SLEEPGELS) 50 MG CAPS Take 50-100 mg by mouth at bedtime.   Yes Historical Provider, MD  HYDROcodone-acetaminophen (NORCO) 10-325 MG per tablet Take 1 tablet by mouth 4 (four) times daily. 02/27/14  Yes Historical Provider, MD  lisinopril (PRINIVIL,ZESTRIL) 10 MG tablet Take 10 mg by mouth daily.   Yes Historical Provider, MD  QUEtiapine (SEROQUEL) 100 MG tablet Take 100 mg by mouth at bedtime.   Yes Historical Provider, MD  traZODone (DESYREL) 100 MG tablet Take 100 mg by mouth at  bedtime.   Yes Historical Provider, MD  citalopram (CELEXA) 20 MG tablet Take 20 mg by mouth daily.    Historical Provider, MD  metroNIDAZOLE (FLAGYL) 500 MG tablet Take 1 tablet (500 mg total) by mouth 2 (two) times daily. 02/07/16   Margette Fast, MD  oxyCODONE-acetaminophen (PERCOCET) 5-325 MG per tablet Take 2 tablets by mouth every 4 (four) hours as needed. Patient not taking: Reported on 01/13/2015 04/01/14   Nat Christen, MD  polyethylene glycol [facility] / Floria Raveling) packet Take 17 g by mouth daily. 02/07/16   Margette Fast, MD    Family History Family History  Problem Relation Age of Onset  . Family history unknown: Yes    Social History Social History  Substance Use Topics  . Smoking status: Former Smoker    Quit date: 12/02/2013  . Smokeless tobacco: Never Used  . Alcohol use No     Allergies   Amoxicillin; Chantix [varenicline]; Codeine; Divalproex sodium; Lithium; Propoxyphene; Augmentin [amoxicillin-pot clavulanate]; and Zolpidem   Review of Systems Review of Systems  Unable to perform ROS: Mental status change     Physical Exam Updated Vital Signs BP 138/75 (BP Location: Right Arm)   Pulse 91   Temp 98.7 F (37.1 C) (Oral)   Resp 18   Ht 5\' 2"  (1.575 m)   Wt 59.5 kg   SpO2 94%   BMI 23.99 kg/m  Physical Exam  Constitutional: She appears well-developed and well-nourished. No distress.  HENT:  Head: Normocephalic and atraumatic.  Eyes: Conjunctivae are normal.  Neck: Neck supple.  Cardiovascular: Normal rate and regular rhythm.   No murmur heard. Pulmonary/Chest: Effort normal and breath sounds normal. No respiratory distress.  Abdominal: Soft. There is no tenderness.  Musculoskeletal: She exhibits no edema.  Neurological: She is alert. She has normal strength. She is disoriented. No cranial nerve deficit or sensory deficit. Coordination normal. GCS eye subscore is 4. GCS verbal subscore is 4. GCS motor subscore is 6.  No dysmetria no  dysdiadochokinesia, no pronator drift.  Skin: Skin is warm and dry.  Psychiatric: She has a normal mood and affect.  Nursing note and vitals reviewed.    ED Treatments / Results  Labs (all labs ordered are listed, but only abnormal results are displayed) Labs Reviewed  COMPREHENSIVE METABOLIC PANEL - Abnormal; Notable for the following:       Result Value   Glucose, Bld 114 (*)    BUN 5 (*)    Calcium 10.8 (*)    All other components within normal limits  RAPID URINE DRUG SCREEN, HOSP PERFORMED - Abnormal; Notable for the following:    Opiates POSITIVE (*)    Benzodiazepines POSITIVE (*)    All other components within normal limits  ACETAMINOPHEN LEVEL - Abnormal; Notable for the following:    Acetaminophen (Tylenol), Serum <10 (*)    All other components within normal limits  AMMONIA - Abnormal; Notable for the following:    Ammonia 49 (*)    All other components within normal limits  T4, FREE - Abnormal; Notable for the following:    Free T4 0.55 (*)    All other components within normal limits  CBC  URINALYSIS, ROUTINE W REFLEX MICROSCOPIC (NOT AT [facility])  ETHANOL  SALICYLATE LEVEL  AMMONIA  VITAMIN B12  TSH  FOLATE RBC  RPR  HIV ANTIBODY (ROUTINE TESTING)  I-STAT BETA HCG BLOOD, ED ([facility], [facility], [facility] ONLY)    EKG  EKG Interpretation  Date/Time:  Sunday March 05 2016 17:14:59 EST Ventricular Rate:  89 PR Interval:    QRS Duration: 77 QT Interval:  371 QTC Calculation: 452 R Axis:   90 Text Interpretation:  Sinus rhythm Anterior infarct, old No old tracing to compare Confirmed by Winfred Leeds  MD, SAM [phone]) on 03/05/2016 5:47:34 PM       Radiology Ct Head Wo Contrast  Result Date: 03/05/2016 CLINICAL DATA:  Acute onset of altered mental status and intermittent headaches. Initial encounter. EXAM: CT HEAD WITHOUT CONTRAST TECHNIQUE: Contiguous axial images were obtained from the base of the skull through the vertex without intravenous contrast. COMPARISON:  CT of  the head performed 09/24/2015 FINDINGS: Brain: No evidence of acute infarction, hemorrhage, hydrocephalus, extra-axial collection or mass lesion/mass effect. The posterior fossa, including the cerebellum, brainstem and fourth ventricle, is within normal limits. The third and lateral ventricles, and basal ganglia are unremarkable in appearance. The cerebral hemispheres are symmetric in appearance, with normal gray-white differentiation. No mass effect or midline shift is seen. Vascular: No hyperdense vessel or unexpected calcification. Skull: There is no evidence of fracture; visualized osseous structures are unremarkable in appearance. Sinuses/Orbits: The visualized portions of the orbits are within normal limits. The paranasal sinuses and mastoid air cells are well-aerated. Other: No significant soft tissue abnormalities are seen. IMPRESSION: Unremarkable noncontrast CT of the head. Electronically Signed   By: Garald Balding M.D.   On:  03/05/2016 19:19    Procedures Procedures (including critical care time)  Medications Ordered in ED Medications  citalopram (CELEXA) tablet 20 mg (not administered)  lisinopril (PRINIVIL,ZESTRIL) tablet 10 mg (not administered)  QUEtiapine (SEROQUEL) tablet 100 mg (100 mg Oral Given 03/05/16 2319)  enoxaparin (LOVENOX) injection 40 mg (not administered)  acetaminophen (TYLENOL) tablet 650 mg (not administered)    Or  acetaminophen (TYLENOL) suppository 650 mg (not administered)  ibuprofen (ADVIL,MOTRIN) tablet 600 mg (600 mg Oral Given 03/05/16 2320)  polyethylene glycol (MIRALAX / GLYCOLAX) packet 17 g (not administered)  ondansetron (ZOFRAN) tablet 4 mg (not administered)    Or  ondansetron (ZOFRAN) injection 4 mg (not administered)  traZODone (DESYREL) tablet 100 mg (not administered)  acetaminophen (TYLENOL) tablet 1,000 mg (1,000 mg Oral Given 03/05/16 1940)  ondansetron (ZOFRAN-ODT) disintegrating tablet 4 mg (4 mg Oral Given 03/05/16 1940)     Initial  Impression / Assessment and Plan / ED Course  I have reviewed the triage vital signs and the nursing notes.  Pertinent labs & imaging results that were available during my care of the patient were reviewed by me and considered in my medical decision making (see chart for details).  Clinical Course    40 year old female comes altered mental state. History of polysubstance abuse as well as manic episodes. She is reported to not slept for several days. Is not answering questions appropriately. Her vital signs are stable she's afebrile. No meningeal signs. Abdomen. After long and repetitive examination neurologic status is without dysfunction on exam. She has no injuries reported there is intermittent report of fall and having injured herself in various places nothing focal on exam. CT head is negative. UDS shows benzos and opiates. Otherwise unremarkable workup this time. Patient will be admitted for acute delirium likely in the setting of polypharmacy possible underlying psychiatric condition. Vital signs are stable time of admission. For the remainder this patient's care please see inpatient team notes.  Final Clinical Impressions(s) / ED Diagnoses   Final diagnoses:  Altered mental status, unspecified altered mental status type  Delirium    New Prescriptions Current Discharge Medication List       Dewaine Conger, MD 03/06/16 [id number]    Orlie Dakin, MD 03/06/16 [id number]

## 2016-03-05 NOTE — ED Notes (Signed)
Pt to CT

## 2016-03-05 NOTE — Progress Notes (Signed)
Pt admitted from ED with c/o of confusion, pt is alert to self and place but disoriented to situation, still c/o of lower back pain, pt settled in bed with call light at bedside, bed alarm set for safety, v/s stable, will however continue to monitor. [name], Nicole Taddei Efe

## 2016-03-06 DIAGNOSIS — Z888 Allergy status to other drugs, medicaments and biological substances status: Secondary | ICD-10-CM

## 2016-03-06 DIAGNOSIS — Z87891 Personal history of nicotine dependence: Secondary | ICD-10-CM

## 2016-03-06 DIAGNOSIS — G894 Chronic pain syndrome: Secondary | ICD-10-CM | POA: Diagnosis not present

## 2016-03-06 DIAGNOSIS — Z79899 Other long term (current) drug therapy: Secondary | ICD-10-CM

## 2016-03-06 DIAGNOSIS — R410 Disorientation, unspecified: Secondary | ICD-10-CM | POA: Diagnosis not present

## 2016-03-06 DIAGNOSIS — G9340 Encephalopathy, unspecified: Secondary | ICD-10-CM | POA: Diagnosis not present

## 2016-03-06 DIAGNOSIS — F312 Bipolar disorder, current episode manic severe with psychotic features: Secondary | ICD-10-CM | POA: Diagnosis not present

## 2016-03-06 DIAGNOSIS — F319 Bipolar disorder, unspecified: Secondary | ICD-10-CM | POA: Diagnosis not present

## 2016-03-06 LAB — RPR: RPR: NONREACTIVE

## 2016-03-06 LAB — HIV ANTIBODY (ROUTINE TESTING W REFLEX): HIV Screen 4th Generation wRfx: NONREACTIVE

## 2016-03-06 MED ORDER — LACTULOSE 10 GM/15ML PO SOLN
20.0000 g | Freq: Two times a day (BID) | ORAL | Status: DC
  Administered 2016-03-06: 20 g via ORAL
  Filled 2016-03-06: qty 30

## 2016-03-06 MED ORDER — CLONAZEPAM 0.5 MG PO TABS
0.5000 mg | ORAL_TABLET | Freq: Two times a day (BID) | ORAL | Status: DC
  Administered 2016-03-06 – 2016-03-08 (×4): 0.5 mg via ORAL
  Filled 2016-03-06 (×4): qty 1

## 2016-03-06 NOTE — Progress Notes (Signed)
Pt's home medications counted and taken down to pharmacy for safe keeping, med list form in shadow chart. [name], [name]
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
## 2016-03-06 NOTE — Evaluation (Signed)
Physical Therapy Evaluation Patient Details Name: [name] MRN: [id number] DOB: 1976-03-31 Today's Date: 03/06/2016   History of Present Illness  pt presents with hallucinations and Encephalopathy.  pt with hx of bipolar and Chronic Back Pain.    Clinical Impression  Pt with limited participation in mobility due to cognitive impairments.  Pt is oriented to name, but difficult to get clear answers from pt.  Upon arrival pt tells PT "There's something coming." and points to her peri area.  PT asked if pt was going to the bathroom and she simply repeats "There's something coming.".  Pt seemed to have urinated in bed, changed gown and bed pad for pt.  When asked if pt still needed to use to bathroom, she said yes and PT A pt on 3-in-1.  Without warning pt attempts to come to standing from 3-in-1 without using bathroom.  When PT inquires about pt needing to use bathroom, pt stated that she thought she had.  RN made aware.  Feel pt will SNF level of care pending further psych needs.  Will continue to follow.      Follow Up Recommendations SNF    Equipment Recommendations  Rolling walker with 5" wheels;3in1 (PT)    Recommendations for Other Services       Precautions / Restrictions Precautions Precautions: Fall Restrictions Weight Bearing Restrictions: No      Mobility  Bed Mobility Overal bed mobility: Needs Assistance Bed Mobility: Supine to Sit;Sit to Supine     Supine to sit: Mod assist;HOB elevated Sit to supine: Min guard   General bed mobility comments: pt needs A with bringing trunk up to sitting and scooting hips closer to EOB.  pt with tremor and inconsistently following directions.    Transfers Overall transfer level: Needs assistance Equipment used: 1 person hand held assist Transfers: Sit to/from [facility] Sit to Stand: Min assist;Mod assist Stand pivot transfers: Mod assist       General transfer comment: MinA to stand from tall 3-in-1,  but ModA from bed.  pt needs A for balance and power up to standing.    Ambulation/Gait                Stairs            Wheelchair Mobility    Modified Rankin (Stroke Patients Only)       Balance Overall balance assessment: Needs assistance Sitting-balance support: Single extremity supported;Bilateral upper extremity supported;Feet supported Sitting balance-Leahy Scale: Poor     Standing balance support: Single extremity supported;Bilateral upper extremity supported;No upper extremity supported;During functional activity Standing balance-Leahy Scale: Poor                               Pertinent Vitals/Pain Pain Assessment: Faces Faces Pain Scale: Hurts a little bit Pain Location: pt did not state, but has chronic back pain.   Pain Descriptors / Indicators: Grimacing Pain Intervention(s): Monitored during session;Premedicated before session;Repositioned    Home Living Family/patient expects to be discharged to:: Unsure                      Prior Function Level of Independence: Independent               Hand Dominance        Extremity/Trunk Assessment   Upper Extremity Assessment: Defer to OT evaluation  Lower Extremity Assessment: Generalized weakness;Difficult to assess due to impaired cognition      Cervical / Trunk Assessment: Normal  Communication   Communication: No difficulties (When pt does speak, it is clear.)  Cognition Arousal/Alertness: Awake/alert Behavior During Therapy: Flat affect Overall Cognitive Status: Impaired/Different from baseline Area of Impairment: Orientation;Attention;Memory;Following commands;Safety/judgement;Awareness;Problem solving Orientation Level: Disoriented to;Situation;Time Current Attention Level: Focused Memory: Decreased short-term memory;Decreased recall of precautions Following Commands: Follows one step commands inconsistently Safety/Judgement: Decreased  awareness of safety;Decreased awareness of deficits Awareness: Intellectual Problem Solving: Slow processing;Decreased initiation;Difficulty sequencing;Requires verbal cues;Requires tactile cues General Comments: pt with significant delay and often requires simple direction repeated.  Unclear pt's baseline level of cognition.  pt very flat and stares past PT.      General Comments      Exercises     Assessment/Plan    PT Assessment Patient needs continued PT services  PT Problem List Decreased strength;Decreased activity tolerance;Decreased balance;Decreased mobility;Decreased coordination;Decreased cognition;Decreased knowledge of use of DME;Decreased safety awareness;Pain          PT Treatment Interventions DME instruction;Gait training;Stair training;Functional mobility training;Therapeutic activities;Therapeutic exercise;Balance training;Cognitive remediation;Patient/family education    PT Goals (Current goals can be found in the Care Plan section)  Acute Rehab PT Goals Patient Stated Goal: pt unable to state. PT Goal Formulation: Patient unable to participate in goal setting Time For Goal Achievement: 03/20/16 Potential to Achieve Goals: Good    Frequency Min 3X/week   Barriers to discharge   Unclear level of A pt has at D/C or home environment.      Co-evaluation               End of Session Equipment Utilized During Treatment: Gait belt Activity Tolerance: Other (comment) (Limited by cognition) Patient left: in bed;with call bell/phone within reach;with bed alarm set Nurse Communication: Mobility status    Functional Assessment Tool Used: Clinical Judgement Functional Limitation: Mobility: Walking and moving around Mobility: Walking and Moving Around Current Status [id number]): At least 20 percent but less than 40 percent impaired, limited or restricted Mobility: Walking and Moving Around Goal Status [phone]): 0 percent impaired, limited or restricted    Time:  1343-1359 PT Time Calculation (min) (ACUTE ONLY): 16 min   Charges:   PT Evaluation $PT Eval Moderate Complexity: 1 Procedure     PT G Codes:   PT G-Codes **NOT FOR INPATIENT CLASS** Functional Assessment Tool Used: Clinical Judgement Functional Limitation: Mobility: Walking and moving around Mobility: Walking and Moving Around Current Status [id number]): At least 20 percent but less than 40 percent impaired, limited or restricted Mobility: Walking and Moving Around Goal Status [phone]): 0 percent impaired, limited or restricted    Catarina Hartshorn, PT  [phone] 03/06/2016, 2:19 PM

## 2016-03-07 ENCOUNTER — Observation Stay (HOSPITAL_COMMUNITY): Payer: Medicare Other

## 2016-03-07 ENCOUNTER — Encounter (HOSPITAL_COMMUNITY): Payer: Self-pay | Admitting: *Deleted

## 2016-03-07 DIAGNOSIS — R443 Hallucinations, unspecified: Secondary | ICD-10-CM

## 2016-03-07 DIAGNOSIS — R410 Disorientation, unspecified: Secondary | ICD-10-CM | POA: Diagnosis not present

## 2016-03-07 DIAGNOSIS — G894 Chronic pain syndrome: Secondary | ICD-10-CM | POA: Diagnosis not present

## 2016-03-07 DIAGNOSIS — F312 Bipolar disorder, current episode manic severe with psychotic features: Secondary | ICD-10-CM

## 2016-03-07 DIAGNOSIS — Z79899 Other long term (current) drug therapy: Secondary | ICD-10-CM | POA: Diagnosis not present

## 2016-03-07 LAB — CBC
HCT: 38 % (ref 36.0–46.0)
Hemoglobin: 12.7 g/dL (ref 12.0–15.0)
MCH: 30.1 pg (ref 26.0–34.0)
MCHC: 33.4 g/dL (ref 30.0–36.0)
MCV: 90 fL (ref 78.0–100.0)
PLATELETS: 262 10*3/uL (ref 150–400)
RBC: 4.22 MIL/uL (ref 3.87–5.11)
RDW: 13.4 % (ref 11.5–15.5)
WBC: 7.5 10*3/uL (ref 4.0–10.5)

## 2016-03-07 LAB — BASIC METABOLIC PANEL
ANION GAP: 11 (ref 5–15)
BUN: 12 mg/dL (ref 6–20)
CALCIUM: 9.5 mg/dL (ref 8.9–10.3)
CO2: 26 mmol/L (ref 22–32)
Chloride: 102 mmol/L (ref 101–111)
Creatinine, Ser: 0.82 mg/dL (ref 0.44–1.00)
Glucose, Bld: 98 mg/dL (ref 65–99)
Potassium: 3.6 mmol/L (ref 3.5–5.1)
SODIUM: 139 mmol/L (ref 135–145)

## 2016-03-07 LAB — FOLATE RBC
FOLATE, HEMOLYSATE: 350.3 ng/mL
FOLATE, RBC: 901 ng/mL (ref >498)
HEMATOCRIT: 38.9 % (ref 34.0–46.6)

## 2016-03-07 NOTE — NC FL2 (Signed)
[place] MEDICAID FL2 LEVEL OF CARE SCREENING TOOL     IDENTIFICATION  Patient Name: [name] Birthdate: [birth year] Sex: female Admission Date (Current Location): 03/05/2016  [facility] and [place] Number:  Herbalist and Address:  [facility], [place] [address] [place], [place] [address]      Provider Number: [id number]  Attending Physician Name and Address:  Geradine Girt, DO  Relative Name and Phone Number:  Deina, [place]; [phone]     Current Level of Care: Hospital Recommended Level of Care: [place] Prior Approval Number:    Date Approved/Denied:   PASRR Number:  (Submitted for PASRR 12/5 - MUST [id number]. )  Discharge Plan: SNF    Current Diagnoses: Patient Active Problem List   Diagnosis Date Noted  . Acute encephalopathy 03/05/2016  . Polypharmacy 03/05/2016  . Chronic pain 03/05/2016  . Bipolar disorder ([place]) 03/05/2016  . Acute delirium 03/05/2016    Orientation RESPIRATION BLADDER Height & Weight     Self, Time, Place  Normal Continent Weight: 131 lb 2.8 oz (59.5 kg) Height:  5\' 2"  (157.5 cm)  BEHAVIORAL SYMPTOMS/MOOD NEUROLOGICAL BOWEL NUTRITION STATUS      Continent Diet (Regular)  AMBULATORY STATUS COMMUNICATION OF NEEDS Skin   Extensive Assist (Patient did not ambulate with PT on 03/06/16) Verbally Normal                       Personal Care Assistance Level of Assistance  Bathing, Feeding, Dressing Bathing Assistance: Limited assistance Feeding assistance: Independent Dressing Assistance: Limited assistance     Functional Limitations Info  Sight, Hearing, Speech Sight Info: Adequate Hearing Info: Adequate Speech Info: Adequate    SPECIAL CARE FACTORS FREQUENCY  PT (By licensed PT)     PT Frequency: Evaluated 12/4               Contractures Contractures Info: Not present    Additional Factors Info  Code Status, Allergies Code Status Info:  Full Allergies Info: Amoxicillin; Chantix varenicline; Codeine; Divalproex sodium; Lithium; Propoxyphene; Augmentin amoxicillin-pot clavulanate; and Zolpidem           Current Medications (03/07/2016):  This is the current hospital active medication list Current Facility-Administered Medications  Medication Dose Route Frequency Provider Last Rate Last Dose  . acetaminophen (TYLENOL) tablet 650 mg  650 mg Oral Q6H PRN Vianne Bulls, MD   650 mg at 03/07/16 0446   Or  . acetaminophen (TYLENOL) suppository 650 mg  650 mg Rectal Q6H PRN Vianne Bulls, MD      . citalopram (CELEXA) tablet 20 mg  20 mg Oral Daily Vianne Bulls, MD   20 mg at 03/07/16 0926  . clonazePAM (KLONOPIN) tablet 0.5 mg  0.5 mg Oral BID Ambrose Finland, MD   0.5 mg at 03/07/16 0927  . enoxaparin (LOVENOX) injection 40 mg  40 mg Subcutaneous Q24H Vianne Bulls, MD   40 mg at 03/07/16 0835  . ibuprofen (ADVIL,MOTRIN) tablet 600 mg  600 mg Oral Q6H PRN Vianne Bulls, MD   600 mg at 03/06/16 2244  . lisinopril (PRINIVIL,ZESTRIL) tablet 10 mg  10 mg Oral Daily Vianne Bulls, MD   10 mg at 03/07/16 [id number]  . ondansetron (ZOFRAN) tablet 4 mg  4 mg Oral Q6H PRN Vianne Bulls, MD       Or  . ondansetron (ZOFRAN) injection 4 mg  4 mg Intravenous Q6H PRN Christia Reading  S Opyd, MD      . polyethylene glycol (MIRALAX / GLYCOLAX) packet 17 g  17 g Oral Daily PRN Vianne Bulls, MD      . QUEtiapine (SEROQUEL) tablet 100 mg  100 mg Oral QHS Vianne Bulls, MD   100 mg at 03/06/16 2244  . traZODone (DESYREL) tablet 100 mg  100 mg Oral QHS Vianne Bulls, MD   100 mg at 03/06/16 2244     Discharge Medications: Please see discharge summary for a list of discharge medications.  Relevant Imaging Results:  Relevant Lab Results:   Additional Information ss#[id number].  Sable Feil, LCSW

## 2016-03-07 NOTE — Procedures (Signed)
HPI:  40 y/o with hallucinations and MS change  TECHNICAL SUMMARY:  A multichannel referential and bipolar montage EEG using the standard international 10-20 system was performed on the patient described as confused, intermittently crying.  The background activity is of very low voltage.  There is a mixture of high frequency beta activity and intermittent 5 Hz theta activity.  ACTIVATION:  Stepwise photic stimulation and hyperventilation were not performed  EPILEPTIFORM ACTIVITY:  There were no spikes, sharp waves or paroxysmal activity.  The patient did have "crying spells" during the recording and with the exception of myogenic artifact, there was no changes in the background activity.  SLEEP:  No sleep  CARDIAC:  The EKG lead revealed a regular sinus rhythm.  IMPRESSION:  This EEG demonstrated no focal, hemispheric, or lateralizing features.  There was a mixture of fast and slow activity, which is most consistent with medication effect.  There was no epileptiform activity recorded on this tracing.  This does not rule out seizure disorder and if seizure remains high among the list of differential diagnoses, an ambulatory EEG could be of value.
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
## 2016-03-07 NOTE — Care Management Obs Status (Signed)
[place] NOTIFICATION   Patient Details  Name: VALA AVANCE MRN: [id number] Date of Birth: 10-15-1975   Medicare Observation Status Notification Given:  Yes    Pollie Friar, RN 03/07/2016, 11:48 AM

## 2016-03-07 NOTE — Care Management Note (Addendum)
Case Management Note  Patient Details  Name: [name] MRN: [id number] Date of Birth: 1976/02/02  Subjective/Objective:  Pt in with bipolar disorder. She is from home with spouse.                Action/Plan: PT recommendations are for SNF. CM following for discharge disposition.   Addendum: Pt states she lives with her husband but he doesn't come home until late in the evening. CM attempted to reach the husband but line is no longer in service. Pt does have a PCP. She goes to [facility].  Expected Discharge Date:                  Expected Discharge Plan:  [place]  In-House Referral:     Discharge planning Services     Post Acute Care Choice:    Choice offered to:     DME Arranged:    DME Agency:     HH Arranged:    HH Agency:     Status of Service:  In process, will continue to follow  If discussed at Long Length of Stay Meetings, dates discussed:    Additional Comments:  Pollie Friar, RN 03/07/2016, 10:48 AM

## 2016-03-07 NOTE — Progress Notes (Signed)
EEG Completed; Results Pending  

## 2016-03-08 DIAGNOSIS — G894 Chronic pain syndrome: Secondary | ICD-10-CM | POA: Diagnosis not present

## 2016-03-08 DIAGNOSIS — R410 Disorientation, unspecified: Secondary | ICD-10-CM | POA: Diagnosis not present

## 2016-03-08 DIAGNOSIS — F312 Bipolar disorder, current episode manic severe with psychotic features: Secondary | ICD-10-CM | POA: Diagnosis not present

## 2016-03-08 DIAGNOSIS — G9340 Encephalopathy, unspecified: Secondary | ICD-10-CM | POA: Diagnosis not present

## 2016-03-08 LAB — HIV ANTIBODY (ROUTINE TESTING W REFLEX): HIV SCREEN 4TH GENERATION: NONREACTIVE

## 2016-03-08 MED ORDER — LOPERAMIDE HCL 2 MG PO CAPS
2.0000 mg | ORAL_CAPSULE | ORAL | Status: DC | PRN
  Administered 2016-03-08: 2 mg via ORAL
  Filled 2016-03-08: qty 1

## 2016-03-08 NOTE — Care Management Note (Addendum)
Case Management Note  Patient Details  Name: [name] MRN: [id number] Date of Birth: 08/06/1975  Subjective/Objective:                    Action/Plan: Pt discharging to home with orders for [facility] services. CM provided her a list of [facility] agencies in [place]. She selected a couple that did not work so [facility] was called. [facility] accepted the [facility]. [facility] given pts address at: [address] And her MD is [facility] in [place], [place]. Pt also with orders for walker. Pt does not want to take walker home she wants it mailed to her house. Brad with [facility] DME notified and will have it mailed to the patient.   Expected Discharge Date:                  Expected Discharge Plan:  [place]  In-House Referral:     Discharge planning Services  CM Consult  Post Acute Care Choice:  Home Health Choice offered to:  Patient  DME Arranged:    DME Agency:     HH Arranged:  PT, OT, Social Work [employer] Agency:  [facility] (now [facility])  Status of Service:  Completed, signed off  If discussed at [facility] [employer], dates discussed:    Additional Comments:  Pollie Friar, RN 03/08/2016, 4:45 PM

## 2016-03-08 NOTE — Clinical Social Work Note (Signed)
CSW received SNF consult on 12/5 and attempted to talk with patient on 12/5 regarding her discharge disposition, however she was still confused. CSW advised by nurse case manager that patient will discharge home. CSW signing off as d/c plan is home.  Zhania Shaheen Givens, MSW, LCSW Licensed Clinical Social Worker [place] [phone]

## 2016-03-08 NOTE — Progress Notes (Signed)
Patient is being discharged home. Discharge instructions were given to patient and family. Patient medications was returned to him by [facility] RN after it was picked up at the pharmacy. Return slip was signed and attached to medical records
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
## 2016-03-26 DIAGNOSIS — D126 Benign neoplasm of colon, unspecified: Secondary | ICD-10-CM

## 2016-03-26 HISTORY — DX: Benign neoplasm of colon, unspecified: D12.6

## 2016-08-10 DIAGNOSIS — G4734 Idiopathic sleep related nonobstructive alveolar hypoventilation: Secondary | ICD-10-CM

## 2016-08-10 HISTORY — DX: Idiopathic sleep related nonobstructive alveolar hypoventilation: G47.34

## 2017-04-25 DIAGNOSIS — G8929 Other chronic pain: Secondary | ICD-10-CM

## 2017-04-25 DIAGNOSIS — M545 Low back pain, unspecified: Secondary | ICD-10-CM

## 2017-04-25 DIAGNOSIS — M542 Cervicalgia: Secondary | ICD-10-CM

## 2017-04-25 DIAGNOSIS — Z79891 Long term (current) use of opiate analgesic: Secondary | ICD-10-CM | POA: Insufficient documentation

## 2017-04-25 HISTORY — DX: Cervicalgia: M54.2

## 2017-04-25 HISTORY — DX: Other chronic pain: G89.29

## 2017-04-25 HISTORY — DX: Low back pain, unspecified: M54.50

## 2017-05-27 DIAGNOSIS — M5030 Other cervical disc degeneration, unspecified cervical region: Secondary | ICD-10-CM

## 2017-05-27 HISTORY — DX: Other cervical disc degeneration, unspecified cervical region: M50.30

## 2017-05-30 DIAGNOSIS — R599 Enlarged lymph nodes, unspecified: Secondary | ICD-10-CM

## 2017-05-30 HISTORY — DX: Enlarged lymph nodes, unspecified: R59.9

## 2017-11-05 ENCOUNTER — Encounter: Payer: Self-pay | Admitting: Nurse Practitioner

## 2017-11-05 ENCOUNTER — Ambulatory Visit: Payer: Medicare Other | Admitting: Nurse Practitioner

## 2017-11-05 VITALS — BP 131/86 | HR 96 | Temp 97.1°F | Ht 62.0 in | Wt 132.0 lb

## 2017-11-05 DIAGNOSIS — F312 Bipolar disorder, current episode manic severe with psychotic features: Secondary | ICD-10-CM

## 2017-11-05 MED ORDER — QUETIAPINE FUMARATE 300 MG PO TABS: 600.0000 mg | ORAL_TABLET | Freq: Every day | ORAL | 5 refills | Status: DC

## 2017-11-05 MED ORDER — LISINOPRIL 10 MG PO TABS: 10.0000 mg | ORAL_TABLET | Freq: Every day | ORAL | 1 refills | Status: DC

## 2017-11-05 MED ORDER — PAROXETINE HCL 40 MG PO TABS: 40.0000 mg | ORAL_TABLET | ORAL | 3 refills | Status: DC

## 2017-11-05 MED ORDER — ALPRAZOLAM 0.25 MG PO TABS: 0.2500 mg | ORAL_TABLET | Freq: Two times a day (BID) | ORAL | 0 refills | Status: DC | PRN

## 2017-11-05 NOTE — Addendum Note (Signed)
Addended by: Chevis Pretty on: 11/05/2017 12:19 PM   Modules accepted: Orders

## 2017-11-05 NOTE — Progress Notes (Signed)
   Subjective:    Patient ID: [name], female    DOB: 09-Apr-1975, 42 y.o.   MRN: [id number]   Chief Complaint: Needs medications filled and referral   HPI Patient come sin today c/o depression. She is currently on seroquel. She was seeing psych but she did not feel like he was doing her any good. She has been on xanax in the past and has not had in over 3 months. She would like to see a different psych person. She is under a lot of stress with her 34 year old son. She says she is hearing voices.  Depression screen PHQ 2/9 11/05/2017  Decreased Interest 3  Down, Depressed, Hopeless 3  PHQ - 2 Score 6  Altered sleeping 1  Tired, decreased energy 2  Change in appetite 2  Feeling bad or failure about yourself  3  Trouble concentrating 3  Moving slowly or fidgety/restless 3  Suicidal thoughts 0  PHQ-9 Score 20   GAD 7 : Generalized Anxiety Score 11/05/2017  Nervous, Anxious, on Edge 3  Control/stop worrying 3  Worry too much - different things 3  Trouble relaxing 2  Restless 3  Easily annoyed or irritable 3  Afraid - awful might happen 3  Total GAD 7 Score 20  Anxiety Difficulty Very difficult        Review of Systems  Constitutional: Negative for activity change and appetite change.  HENT: Negative.   Eyes: Negative for pain.  Respiratory: Negative for shortness of breath.   Cardiovascular: Negative for chest pain, palpitations and leg swelling.  Gastrointestinal: Negative for abdominal pain.  Endocrine: Negative for polydipsia.  Genitourinary: Negative.   Skin: Negative for rash.  Neurological: Negative for dizziness, weakness and headaches.  Hematological: Does not bruise/bleed easily.  Psychiatric/Behavioral: Negative.   All other systems reviewed and are negative.      Objective:   Physical Exam  Constitutional: She is oriented to person, place, and time. She appears well-nourished. She appears distressed (moderate).  Cardiovascular: Normal rate.    Pulmonary/Chest: Effort normal.  Neurological: She is alert and oriented to person, place, and time.  Skin: Skin is warm.  Psychiatric: Her speech is normal and behavior is normal. Judgment and thought content normal. Cognition and memory are normal. She exhibits a depressed mood.  Tearful during exam    BP 131/86   Pulse 96   Temp (!) 97.1 F (36.2 C) (Oral)   Ht 5\' 2"  (1.575 m)   Wt 132 lb (59.9 kg)   BMI 24.14 kg/m         Assessment & Plan:  [name] in today with chief complaint of Needs medications filled and referral   1. Bipolar affective disorder, currently manic, severe, with psychotic features ([place]) Stress management - QUEtiapine (SEROQUEL) 300 MG tablet; Take 2 tablets (600 mg total) by mouth at bedtime.  Dispense: 60 tablet; Refill: 5 - Ambulatory referral to Psychiatry - ALPRAZolam (XANAX) 0.25 MG tablet; Take 1 tablet (0.25 mg total) by mouth 2 (two) times daily as needed for anxiety.  Dispense: 40 tablet; Refill: 0 - PARoxetine (PAXIL) 40 MG tablet; Take 1 tablet (40 mg total) by mouth every morning.  Dispense: 30 tablet; Refill: [place], FNP

## 2017-11-05 NOTE — Patient Instructions (Signed)
Stress and Stress Management Stress is a normal reaction to life events. It is what you feel when life demands more than you are used to or more than you can handle. Some stress can be useful. For example, the stress reaction can help you catch the last bus of the day, study for a test, or meet a deadline at work. But stress that occurs too often or for too long can cause problems. It can affect your emotional health and interfere with relationships and normal daily activities. Too much stress can weaken your immune system and increase your risk for physical illness. If you already have a medical problem, stress can make it worse. What are the causes? All sorts of life events may cause stress. An event that causes stress for one person may not be stressful for another person. Major life events commonly cause stress. These may be positive or negative. Examples include losing your job, moving into a new home, getting married, having a baby, or losing a loved one. Less obvious life events may also cause stress, especially if they occur day after day or in combination. Examples include working long hours, driving in traffic, caring for children, being in debt, or being in a difficult relationship. What are the signs or symptoms? Stress may cause emotional symptoms including, the following:  Anxiety. This is feeling worried, afraid, on edge, overwhelmed, or out of control.  Anger. This is feeling irritated or impatient.  Depression. This is feeling sad, down, helpless, or guilty.  Difficulty focusing, remembering, or making decisions.  Stress may cause physical symptoms, including the following:  Aches and pains. These may affect your head, neck, back, stomach, or other areas of your body.  Tight muscles or clenched jaw.  Low energy or trouble sleeping.  Stress may cause unhealthy behaviors, including the following:  Eating to feel better (overeating) or skipping meals.  Sleeping too little,  too much, or both.  Working too much or putting off tasks (procrastination).  Smoking, drinking alcohol, or using drugs to feel better.  How is this diagnosed? Stress is diagnosed through an assessment by your health care provider. Your health care provider will ask questions about your symptoms and any stressful life events.Your health care provider will also ask about your medical history and may order blood tests or other tests. Certain medical conditions and medicine can cause physical symptoms similar to stress. Mental illness can cause emotional symptoms and unhealthy behaviors similar to stress. Your health care provider may refer you to a mental health professional for further evaluation. How is this treated? Stress management is the recommended treatment for stress.The goals of stress management are reducing stressful life events and coping with stress in healthy ways. Techniques for reducing stressful life events include the following:  Stress identification. Self-monitor for stress and identify what causes stress for you. These skills may help you to avoid some stressful events.  Time management. Set your priorities, keep a calendar of events, and learn to say "no." These tools can help you avoid making too many commitments.  Techniques for coping with stress include the following:  Rethinking the problem. Try to think realistically about stressful events rather than ignoring them or overreacting. Try to find the positives in a stressful situation rather than focusing on the negatives.  Exercise. Physical exercise can release both physical and emotional tension. The key is to find a form of exercise you enjoy and do it regularly.  Relaxation techniques. These relax the body and  mind. Examples include yoga, meditation, tai chi, biofeedback, deep breathing, progressive muscle relaxation, listening to music, being out in nature, journaling, and other hobbies. Again, the key is to find  one or more that you enjoy and can do regularly.  Healthy lifestyle. Eat a balanced diet, get plenty of sleep, and do not smoke. Avoid using alcohol or drugs to relax.  Strong support network. Spend time with family, friends, or other people you enjoy being around.Express your feelings and talk things over with someone you trust.  Counseling or talktherapy with a mental health professional may be helpful if you are having difficulty managing stress on your own. Medicine is typically not recommended for the treatment of stress.Talk to your health care provider if you think you need medicine for symptoms of stress. Follow these instructions at home:  Keep all follow-up visits as directed by your health care provider.  Take all medicines as directed by your health care provider. Contact a health care provider if:  Your symptoms get worse or you start having new symptoms.  You feel overwhelmed by your problems and can no longer manage them on your own. Get help right away if:  You feel like hurting yourself or someone else. This information is not intended to replace advice given to you by your health care provider. Make sure you discuss any questions you have with your health care provider. Document Released: 09/13/2000 Document Revised: 08/26/2015 Document Reviewed: 11/12/2012 [employer] Interactive Patient Education  2017 [employer]  

## 2017-11-12 ENCOUNTER — Ambulatory Visit (INDEPENDENT_AMBULATORY_CARE_PROVIDER_SITE_OTHER): Payer: Medicare Other | Admitting: Family Medicine

## 2017-11-12 ENCOUNTER — Encounter: Payer: Self-pay | Admitting: Family Medicine

## 2017-11-12 VITALS — BP 116/83 | HR 83 | Temp 97.6°F | Ht 62.0 in | Wt 130.0 lb

## 2017-11-12 DIAGNOSIS — B370 Candidal stomatitis: Secondary | ICD-10-CM

## 2017-11-12 MED ORDER — MAGIC MOUTHWASH W/LIDOCAINE: ORAL | 0 refills | Status: DC

## 2017-11-12 NOTE — Progress Notes (Signed)
Subjective: CC: "HPV of mouth" PCP: Chevis Pretty, FNP [id number] [name] is a 42 y.o. female presenting to clinic today for:  1. 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  She notes that she was diagnosed with this at [facility] recently.  She states that she has noticed multiple other oral lesions appearing and wonders if there is anything that she can do.  She notes is burning in her mouth.  Denies any fevers or chills.  No difficulty swallowing.  She is wondering if she needs Valtrex for this.  She is an every day smoker.   ROS: Per HPI  Allergies  Allergen Reactions  . Amoxicillin Nausea And Vomiting  . Chantix [Varenicline] Other (See Comments)    Causes bad nightmares, hallucinations  . Codeine Other (See Comments)  . Divalproex Sodium Other (See Comments)    sedation  . Lithium Other (See Comments)    Drowsiness, tremors drowsiness  . Propoxyphene Nausea And Vomiting  . Augmentin [Amoxicillin-Pot Clavulanate] Diarrhea and Other (See Comments)  . Zolpidem Other (See Comments)    Patient states it does not agree with her body.    Past Medical History:  Diagnosis Date  . Anxiety   . Back pain   . IBS (irritable bowel syndrome)     Current Outpatient Medications:  .  ALPRAZolam (XANAX) 0.25 MG tablet, Take 1 tablet (0.25 mg total) by mouth 2 (two) times daily as needed for anxiety., Disp: 40 tablet, Rfl: 0 .  HYDROcodone-acetaminophen (NORCO) 10-325 MG per tablet, Take 1 tablet by mouth 4 (four) times daily., Disp: , Rfl:  .  lisinopril (PRINIVIL,ZESTRIL) 10 MG tablet, Take 1 tablet (10 mg total) by mouth daily., Disp: 30 tablet, Rfl: 1 .  PARoxetine (PAXIL) 40 MG tablet, Take 1 tablet (40 mg total) by mouth every morning., Disp: 30 tablet, Rfl: 3 .  polyethylene glycol (MIRALAX / GLYCOLAX) packet, Take 17 g by mouth daily., Disp: 14 each, Rfl: 0 .  QUEtiapine (SEROQUEL) 300 MG tablet, Take 2 tablets (600 mg total) by mouth at bedtime.,  Disp: 60 tablet, Rfl: 5 Social History   Socioeconomic History  . Marital status: Married    Spouse name: Not on file  . Number of children: Not on file  . Years of education: Not on file  . Highest education level: Not on file  Occupational History  . Not on file  Social Needs  . Financial resource strain: Not on file  . Food insecurity:    Worry: Not on file    Inability: Not on file  . Transportation needs:    Medical: Not on file    Non-medical: Not on file  Tobacco Use  . Smoking status: Former Smoker    Last attempt to quit: 12/02/2013    Years since quitting: 3.9  . Smokeless tobacco: Never Used  Substance and Sexual Activity  . Alcohol use: No  . Drug use: No  . Sexual activity: Yes    Birth control/protection: None  Lifestyle  . Physical activity:    Days per week: Not on file    Minutes per session: Not on file  . Stress: Not on file  Relationships  . Social connections:    Talks on phone: Not on file    Gets together: Not on file    Attends religious service: Not on file    Active member of club or organization: Not on file    Attends meetings of clubs or organizations:  Not on file    Relationship status: Not on file  . Intimate partner violence:    Fear of current or ex partner: Not on file    Emotionally abused: Not on file    Physically abused: Not on file    Forced sexual activity: Not on file  Other Topics Concern  . Not on file  Social History Narrative  . Not on file   Family History  Family history unknown: Yes    Objective: Office vital signs reviewed. BP 116/83   Pulse 83   Temp 97.6 F (36.4 C) (Oral)   Ht 5\' 2"  (1.575 m)   Wt 130 lb (59 kg)   BMI 23.78 kg/m   Physical Examination:  General: Awake, alert, nontoxic, No acute distress HEENT: Normal    Neck: No masses palpated. No lymphadenopathy    Eyes: extraocular membranes intact, sclera white    Throat: moist mucus membranes, white creamy substance appreciated on bilateral  aspects of the tongue.  No oral ulcerations, petechiae or vesicles appreciated.  Airway is patent Cardio: regular rate and rhythm, S1S2 heard, no murmurs appreciated Pulm: clear to auscultation bilaterally, no wheezes, rhonchi or rales; normal work of breathing on room air Psych: Mood labile.  Affect flat.  Assessment/ Plan: 42 y.o. female   1. Thrush, oral Patient is afebrile nontoxic-appearing.  Clinically appears to have thrush.  Given concomitant oral irritation, will prescribe Magic mouthwash with lidocaine.  Instructions for use were reviewed.  Reasons for return discussed.  Smoking cessation recommended.  Follow-up with PCP as needed.   Meds ordered this encounter  Medications  . magic mouthwash w/lidocaine SOLN    Sig: Gargle and spit 51mL every 6 hours for thrush/ mouth pain    Dispense:  150 mL    Refill:  0    60 cc lidocaine in 480 cc of MMW     [name], [place] [phone]

## 2017-11-12 NOTE — Patient Instructions (Signed)
Oral Thrush, Adult Oral thrush is an infection in your mouth and throat. It causes white patches on your tongue and in your mouth. Follow these instructions at home: Helping with soreness  To lessen your pain: ? Drink cold liquids, like water and iced tea. ? Eat frozen ice pops or frozen juices. ? Eat foods that are easy to swallow, like gelatin and ice cream. ? Drink from a straw if the patches in your mouth are painful. General instructions   Take or use over-the-counter and prescription medicines only as told by your doctor. Medicine for oral thrush may be something to swallow, or it may be something to put on the infected area.  Eat plain yogurt that has live cultures in it. Read the label to make sure.  If you wear dentures: ? Take out your dentures before you go to bed. ? Brush them well. ? Soak them in a denture cleaner.  Rinse your mouth with warm salt-water many times a day. To make the salt-water mixture, completely dissolve 1/2-1 teaspoon of salt in 1 cup of warm water. Contact a doctor if:  Your problems are getting worse.  Your problems do not get better in less than 7 days with treatment.  Your infection is spreading. This may show as white patches on the skin outside of your mouth.  You are nursing your baby and you have redness and pain in the nipples. This information is not intended to replace advice given to you by your health care provider. Make sure you discuss any questions you have with your health care provider. Document Released: 06/14/2009 Document Revised: 12/13/2015 Document Reviewed: 12/13/2015 Elsevier Interactive Patient Education  2017 Elsevier Inc.  

## 2017-11-29 ENCOUNTER — Other Ambulatory Visit: Payer: Self-pay | Admitting: Nurse Practitioner

## 2017-11-29 DIAGNOSIS — F312 Bipolar disorder, current episode manic severe with psychotic features: Secondary | ICD-10-CM

## 2017-11-30 ENCOUNTER — Other Ambulatory Visit: Payer: Self-pay | Admitting: Nurse Practitioner

## 2017-11-30 DIAGNOSIS — F312 Bipolar disorder, current episode manic severe with psychotic features: Secondary | ICD-10-CM

## 2017-11-30 MED ORDER — PAROXETINE HCL 40 MG PO TABS: 40.0000 mg | ORAL_TABLET | ORAL | 0 refills | Status: DC

## 2017-11-30 MED ORDER — LISINOPRIL 10 MG PO TABS: 10.0000 mg | ORAL_TABLET | Freq: Every day | ORAL | 0 refills | Status: DC

## 2017-11-30 NOTE — Telephone Encounter (Signed)
Pt aware 2 medications sent in for 90 day supply. Other is waiting on [facility] approval

## 2017-11-30 NOTE — Telephone Encounter (Signed)
Last seen 11/12/17 

## 2017-12-10 ENCOUNTER — Other Ambulatory Visit: Payer: Self-pay | Admitting: Nurse Practitioner

## 2017-12-13 ENCOUNTER — Telehealth: Payer: Self-pay | Admitting: Nurse Practitioner

## 2017-12-13 ENCOUNTER — Ambulatory Visit: Payer: Medicare Other | Admitting: Nurse Practitioner

## 2017-12-13 NOTE — Telephone Encounter (Signed)
Has to be seen for these- needs lab work as well- see if can get her in sooner

## 2017-12-14 NOTE — Telephone Encounter (Signed)
Called pt - mon appt made

## 2017-12-17 ENCOUNTER — Ambulatory Visit (INDEPENDENT_AMBULATORY_CARE_PROVIDER_SITE_OTHER): Payer: Medicare Other | Admitting: Nurse Practitioner

## 2017-12-17 ENCOUNTER — Encounter: Payer: Self-pay | Admitting: Nurse Practitioner

## 2017-12-17 VITALS — BP 126/80 | HR 88 | Temp 97.1°F | Ht 62.0 in | Wt 127.0 lb

## 2017-12-17 DIAGNOSIS — F312 Bipolar disorder, current episode manic severe with psychotic features: Secondary | ICD-10-CM | POA: Diagnosis not present

## 2017-12-17 DIAGNOSIS — B370 Candidal stomatitis: Secondary | ICD-10-CM

## 2017-12-17 DIAGNOSIS — I10 Essential (primary) hypertension: Secondary | ICD-10-CM

## 2017-12-17 MED ORDER — QUETIAPINE FUMARATE 300 MG PO TABS: 600.0000 mg | ORAL_TABLET | Freq: Every day | ORAL | 5 refills | Status: DC

## 2017-12-17 MED ORDER — ALPRAZOLAM 0.25 MG PO TABS: ORAL_TABLET | ORAL | 2 refills | Status: DC

## 2017-12-17 MED ORDER — NYSTATIN 100000 UNIT/ML MT SUSP: 5.0000 mL | Freq: Four times a day (QID) | OROMUCOSAL | 2 refills | Status: DC

## 2017-12-17 MED ORDER — PAROXETINE HCL 40 MG PO TABS: 40.0000 mg | ORAL_TABLET | ORAL | 0 refills | Status: DC

## 2017-12-17 MED ORDER — LISINOPRIL 10 MG PO TABS: 10.0000 mg | ORAL_TABLET | Freq: Every day | ORAL | 0 refills | Status: DC

## 2017-12-17 MED ORDER — QUETIAPINE FUMARATE 300 MG PO TABS: 600.0000 mg | ORAL_TABLET | Freq: Every day | ORAL | 1 refills | Status: DC

## 2017-12-17 NOTE — Progress Notes (Signed)
Subjective:    Patient ID: [name], female    DOB: 11-15-1975, 42 y.o.   MRN: [id number]   Chief Complaint: Medical Management of Chronic Issues   HPI:  1. Bipolar affective disorder, currently manic, severe, with psychotic features ([place]) Patient is currently on seroquel, paxil and xanax 1/2 tablet bid. She is very anxious. She is stll having a lot of problems with er sons mental status. GAD 7 : Generalized Anxiety Score 12/17/2017 11/05/2017  Nervous, Anxious, on Edge 3 3  Control/stop worrying 3 3  Worry too much - different things 3 3  Trouble relaxing 3 2  Restless 2 3  Easily annoyed or irritable 2 3  Afraid - awful might happen 3 3  Total GAD 7 Score 19 20  Anxiety Difficulty Very difficult Very difficult   Depression screen [facility] 2/9 12/17/2017 11/05/2017  Decreased Interest 0 3  Down, Depressed, Hopeless 1 3  PHQ - 2 Score 1 6  Altered sleeping - 1  Tired, decreased energy - 2  Change in appetite - 2  Feeling bad or failure about yourself  - 3  Trouble concentrating - 3  Moving slowly or fidgety/restless - 3  Suicidal thoughts - 0  PHQ-9 Score - 20      2. Essential hypertension  No c/o chest pain, sob or headache. Doe snot check blood pressure at home. BP Readings from Last 3 Encounters:  12/17/17 126/80  11/12/17 116/83  11/05/17 131/86       Outpatient Encounter Medications as of 12/17/2017  Medication Sig  . ALPRAZolam (XANAX) 0.25 MG tablet Take 1 tablet by mouth 2 (two) times daily as needed for anxiety.  Marland Kitchen HYDROcodone-acetaminophen (NORCO) 10-325 MG per tablet Take 1 tablet by mouth 4 (four) times daily.  Marland Kitchen lisinopril (PRINIVIL,ZESTRIL) 10 MG tablet Take 1 tablet (10 mg total) by mouth daily.  Marland Kitchen PARoxetine (PAXIL) 40 MG tablet Take 1 tablet (40 mg total) by mouth every morning.  . polyethylene glycol (MIRALAX / GLYCOLAX) packet Take 17 g by mouth daily.  . QUEtiapine (SEROQUEL) 300 MG tablet Take 2 tablets (600 mg total) by mouth at bedtime.        New complaints: None today  Social history: Her son has been in behavioral health and signed hisself out. He refuses to get help   Review of Systems  Constitutional: Negative for activity change and appetite change.  HENT: Negative.   Eyes: Negative for pain.  Respiratory: Negative for shortness of breath.   Cardiovascular: Negative for chest pain, palpitations and leg swelling.  Gastrointestinal: Negative for abdominal pain.  Endocrine: Negative for polydipsia.  Genitourinary: Negative.   Skin: Negative for rash.  Neurological: Negative for dizziness, weakness and headaches.  Hematological: Does not bruise/bleed easily.  Psychiatric/Behavioral: The patient is nervous/anxious.   All other systems reviewed and are negative.      Objective:   Physical Exam  Constitutional: She is oriented to person, place, and time. She appears well-developed and well-nourished. No distress.  HENT:  Head: Normocephalic.  Nose: Nose normal.  Mouth/Throat: Oropharynx is clear and moist.  White film oral mucosa and on tongue Cracks at corner of mouth  Eyes: Pupils are equal, round, and reactive to light. EOM are normal.  Neck: Normal range of motion. Neck supple. No JVD present. Carotid bruit is not present.  Cardiovascular: Normal rate, regular rhythm, normal heart sounds and intact distal pulses.  Pulmonary/Chest: Effort normal and breath sounds normal. No respiratory distress. She  has no wheezes. She has no rales. She exhibits no tenderness.  Abdominal: Soft. Normal appearance, normal aorta and bowel sounds are normal. She exhibits no distension, no abdominal bruit, no pulsatile midline mass and no mass. There is no splenomegaly or hepatomegaly. There is no tenderness.  Musculoskeletal: Normal range of motion. She exhibits no edema.  Lymphadenopathy:    She has no cervical adenopathy.  Neurological: She is alert and oriented to person, place, and time. She has normal reflexes.   Skin: Skin is warm and dry.  Psychiatric: She has a normal mood and affect. Her behavior is normal. Judgment and thought content normal.  Nursing note and vitals reviewed.   BP 126/80   Pulse 88   Temp (!) 97.1 F (36.2 C) (Oral)   Ht '5\' 2"'$  (1.575 m)   Wt 127 lb (57.6 kg)   BMI 23.23 kg/m        Assessment & Plan:  [name] comes in today with chief complaint of Medical Management of Chronic Issues   Diagnosis and orders addressed:  1. Bipolar affective disorder, currently manic, severe, with psychotic features ([place]) Stress management - PARoxetine (PAXIL) 40 MG tablet; Take 1 tablet (40 mg total) by mouth every morning.  Dispense: 90 tablet; Refill: 0 - ALPRAZolam (XANAX) 0.25 MG tablet; Take 1 tablet by mouth 2 (two) times daily as needed for anxiety.  Dispense: 60 tablet; Refill: 2 - QUEtiapine (SEROQUEL) 300 MG tablet; Take 2 tablets (600 mg total) by mouth at bedtime.  Dispense: 60 tablet; Refill: 5  2. Essential hypertension Low sodium diet - lisinopril (PRINIVIL,ZESTRIL) 10 MG tablet; Take 1 tablet (10 mg total) by mouth daily.  Dispense: 90 tablet; Refill: 0 - CMP14+EGFR - Lipid panel  3. Thrush, oral Nystatin oral suspension    Labs pending Health Maintenance reviewed Diet and exercise encouraged  Follow up plan: 3 months   [place], FNP

## 2017-12-17 NOTE — Patient Instructions (Signed)
Oral Thrush, Adult Oral thrush is an infection in your mouth and throat. It causes white patches on your tongue and in your mouth. Follow these instructions at home: Helping with soreness  To lessen your pain: ? Drink cold liquids, like water and iced tea. ? Eat frozen ice pops or frozen juices. ? Eat foods that are easy to swallow, like gelatin and ice cream. ? Drink from a straw if the patches in your mouth are painful. General instructions   Take or use over-the-counter and prescription medicines only as told by your doctor. Medicine for oral thrush may be something to swallow, or it may be something to put on the infected area.  Eat plain yogurt that has live cultures in it. Read the label to make sure.  If you wear dentures: ? Take out your dentures before you go to bed. ? Brush them well. ? Soak them in a denture cleaner.  Rinse your mouth with warm salt-water many times a day. To make the salt-water mixture, completely dissolve 1/2-1 teaspoon of salt in 1 cup of warm water. Contact a doctor if:  Your problems are getting worse.  Your problems do not get better in less than 7 days with treatment.  Your infection is spreading. This may show as white patches on the skin outside of your mouth.  You are nursing your baby and you have redness and pain in the nipples. This information is not intended to replace advice given to you by your health care provider. Make sure you discuss any questions you have with your health care provider. Document Released: 06/14/2009 Document Revised: 12/13/2015 Document Reviewed: 12/13/2015 Elsevier Interactive Patient Education  2017 Elsevier Inc.  

## 2017-12-18 ENCOUNTER — Telehealth: Payer: Self-pay | Admitting: Nurse Practitioner

## 2017-12-18 LAB — LIPID PANEL
CHOL/HDL RATIO: 6.6 ratio — AB (ref 0.0–4.4)
CHOLESTEROL TOTAL: 312 mg/dL — AB (ref 100–199)
HDL: 47 mg/dL (ref >39)
LDL Calculated: 225 mg/dL — ABNORMAL HIGH (ref 0–99)
TRIGLYCERIDES: 202 mg/dL — AB (ref 0–149)
VLDL Cholesterol Cal: 40 mg/dL (ref 5–40)

## 2017-12-18 LAB — CMP14+EGFR
A/G RATIO: 1.8 (ref 1.2–2.2)
ALK PHOS: 135 IU/L — AB (ref 39–117)
ALT: 8 IU/L (ref 0–32)
AST: 16 IU/L (ref 0–40)
Albumin: 4.8 g/dL (ref 3.5–5.5)
BUN/Creatinine Ratio: 14 (ref 9–23)
BUN: 12 mg/dL (ref 6–24)
CHLORIDE: 97 mmol/L (ref 96–106)
CO2: 27 mmol/L (ref 20–29)
Calcium: 9.4 mg/dL (ref 8.7–10.2)
Creatinine, Ser: 0.88 mg/dL (ref 0.57–1.00)
GFR calc non Af Amer: 81 mL/min/{1.73_m2} (ref >59)
GFR, EST AFRICAN AMERICAN: 94 mL/min/{1.73_m2} (ref >59)
Globulin, Total: 2.7 g/dL (ref 1.5–4.5)
Glucose: 88 mg/dL (ref 65–99)
POTASSIUM: 4.7 mmol/L (ref 3.5–5.2)
Sodium: 139 mmol/L (ref 134–144)
Total Protein: 7.5 g/dL (ref 6.0–8.5)

## 2017-12-18 NOTE — Telephone Encounter (Signed)
Patient was on 0.25 according to chart and she told me she was taking 1/2 tablet BID and she wanted to increase to whole tablet- that is why she was rx same dose but 1 bid.

## 2017-12-18 NOTE — Telephone Encounter (Signed)
Patient aware.

## 2017-12-18 NOTE — Telephone Encounter (Signed)
Left message to call back  

## 2017-12-28 ENCOUNTER — Telehealth: Payer: Self-pay | Admitting: Nurse Practitioner

## 2017-12-28 MED ORDER — MAGIC MOUTHWASH: 5.0000 mL | Freq: Four times a day (QID) | ORAL | 0 refills | Status: DC

## 2017-12-28 NOTE — Telephone Encounter (Signed)
Patient will hav eto come in and pick up rx or you can call it in. Magic mouthwash will not e rx.

## 2018-01-01 ENCOUNTER — Encounter: Payer: Self-pay | Admitting: Nurse Practitioner

## 2018-01-01 ENCOUNTER — Ambulatory Visit (INDEPENDENT_AMBULATORY_CARE_PROVIDER_SITE_OTHER): Payer: Medicare Other | Admitting: Nurse Practitioner

## 2018-01-01 VITALS — BP 130/85 | HR 102 | Temp 96.7°F | Ht 62.0 in | Wt 128.0 lb

## 2018-01-01 DIAGNOSIS — F411 Generalized anxiety disorder: Secondary | ICD-10-CM | POA: Diagnosis not present

## 2018-01-01 DIAGNOSIS — B370 Candidal stomatitis: Secondary | ICD-10-CM | POA: Diagnosis not present

## 2018-01-01 DIAGNOSIS — K130 Diseases of lips: Secondary | ICD-10-CM

## 2018-01-01 MED ORDER — ALPRAZOLAM 0.5 MG PO TABS: 0.5000 mg | ORAL_TABLET | Freq: Two times a day (BID) | ORAL | 1 refills | Status: DC | PRN

## 2018-01-01 MED ORDER — FLUCONAZOLE 200 MG PO TABS: 200.0000 mg | ORAL_TABLET | Freq: Every day | ORAL | 1 refills | Status: DC

## 2018-01-01 NOTE — Progress Notes (Signed)
   Subjective:    Patient ID: [name], female    DOB: 1975/05/14, 42 y.o.   MRN: [id number]   Chief Complaint: Rash in mouth (Gets once a month. Burns with eating)   HPI Patient come s in with sore lesions in the upper mucosa as well has cracked areas on bil sides of ,outh. She gets this every month and lasts for  Several days. She has been on nystatin as of late and it helps it but it never completely resolves.  - nerves are bad- she is still having problems with her son and she keeps her grandchildren during the day. She is currnetly on. GAD 7 : Generalized Anxiety Score 01/01/2018 12/17/2017 11/05/2017  Nervous, Anxious, on Edge 3 3 3   Control/stop worrying 3 3 3   Worry too much - different things 3 3 3   Trouble relaxing 2 3 2   Restless 3 2 3   Easily annoyed or irritable 3 2 3   Afraid - awful might happen - 3 3  Total GAD 7 Score 20  19 20   Anxiety Difficulty Very difficult Very difficult Very difficult       Review of Systems  Constitutional: Negative.   Respiratory: Negative.   Cardiovascular: Negative.   Gastrointestinal: Negative.   Genitourinary: Negative.   Neurological: Negative.   Psychiatric/Behavioral: Negative.   All other systems reviewed and are negative.      Objective:   Physical Exam  Constitutional: She appears well-developed and well-nourished. No distress.  HENT:  Corners of mouth cracked and erythematous White film on tongue and buccal mucosa   Cardiovascular: Normal rate and regular rhythm.  Pulmonary/Chest: Effort normal.  Skin: Skin is warm and dry.  Psychiatric: She has a normal mood and affect. Her behavior is normal. Thought content normal.   BP 130/85   Pulse (!) 102   Temp (!) 96.7 F (35.9 C) (Oral)   Ht 5\' 2"  (1.575 m)   Wt 128 lb (58.1 kg)   BMI 23.41 kg/m         Assessment & Plan:  [name] in today with chief complaint of Rash in mouth (Gets once a month. Burns with eating)   1. Oral candidiasis -  fluconazole (DIFLUCAN) 200 MG tablet; Take 1 tablet (200 mg total) by mouth daily.  Dispense: 14 tablet; Refill: 1  2. Angular cheilitis - fluconazole (DIFLUCAN) 200 MG tablet; Take 1 tablet (200 mg total) by mouth daily.  Dispense: 14 tablet; Refill: 1  3. GAD (generalized anxiety disorder) Stress management - ALPRAZolam (XANAX) 0.5 MG tablet; Take 1 tablet (0.5 mg total) by mouth 2 (two) times daily as needed for anxiety.  Dispense: 60 tablet; Refill: [place], FNP

## 2018-01-01 NOTE — Patient Instructions (Signed)
Oral Thrush, Adult Oral thrush is an infection in your mouth and throat. It causes white patches on your tongue and in your mouth. Follow these instructions at home: Helping with soreness  To lessen your pain: ? Drink cold liquids, like water and iced tea. ? Eat frozen ice pops or frozen juices. ? Eat foods that are easy to swallow, like gelatin and ice cream. ? Drink from a straw if the patches in your mouth are painful. General instructions   Take or use over-the-counter and prescription medicines only as told by your doctor. Medicine for oral thrush may be something to swallow, or it may be something to put on the infected area.  Eat plain yogurt that has live cultures in it. Read the label to make sure.  If you wear dentures: ? Take out your dentures before you go to bed. ? Brush them well. ? Soak them in a denture cleaner.  Rinse your mouth with warm salt-water many times a day. To make the salt-water mixture, completely dissolve 1/2-1 teaspoon of salt in 1 cup of warm water. Contact a doctor if:  Your problems are getting worse.  Your problems do not get better in less than 7 days with treatment.  Your infection is spreading. This may show as white patches on the skin outside of your mouth.  You are nursing your baby and you have redness and pain in the nipples. This information is not intended to replace advice given to you by your health care provider. Make sure you discuss any questions you have with your health care provider. Document Released: 06/14/2009 Document Revised: 12/13/2015 Document Reviewed: 12/13/2015 Elsevier Interactive Patient Education  2017 Elsevier Inc.  

## 2018-01-01 NOTE — Telephone Encounter (Signed)
Patient seen for office visit today by Chevis Pretty.

## 2018-01-04 ENCOUNTER — Ambulatory Visit: Payer: Medicare Other | Admitting: Nurse Practitioner

## 2018-01-11 ENCOUNTER — Telehealth: Payer: Self-pay | Admitting: Nurse Practitioner

## 2018-01-16 NOTE — Telephone Encounter (Signed)
Left detailed message for pt to call and schedule appt for evaluation

## 2018-03-04 ENCOUNTER — Other Ambulatory Visit: Payer: Self-pay | Admitting: Nurse Practitioner

## 2018-03-04 DIAGNOSIS — F411 Generalized anxiety disorder: Secondary | ICD-10-CM

## 2018-03-19 ENCOUNTER — Ambulatory Visit: Payer: Medicare Other | Admitting: Nurse Practitioner

## 2018-03-21 ENCOUNTER — Ambulatory Visit (INDEPENDENT_AMBULATORY_CARE_PROVIDER_SITE_OTHER): Payer: Medicare Other | Admitting: Nurse Practitioner

## 2018-03-21 ENCOUNTER — Encounter: Payer: Self-pay | Admitting: Nurse Practitioner

## 2018-03-21 VITALS — BP 104/77 | HR 103 | Temp 98.1°F | Ht 62.0 in | Wt 126.0 lb

## 2018-03-21 DIAGNOSIS — B370 Candidal stomatitis: Secondary | ICD-10-CM

## 2018-03-21 DIAGNOSIS — N898 Other specified noninflammatory disorders of vagina: Secondary | ICD-10-CM

## 2018-03-21 DIAGNOSIS — I10 Essential (primary) hypertension: Secondary | ICD-10-CM

## 2018-03-21 DIAGNOSIS — F312 Bipolar disorder, current episode manic severe with psychotic features: Secondary | ICD-10-CM

## 2018-03-21 DIAGNOSIS — K130 Diseases of lips: Secondary | ICD-10-CM

## 2018-03-21 MED ORDER — QUETIAPINE FUMARATE 300 MG PO TABS: 600.0000 mg | ORAL_TABLET | Freq: Every day | ORAL | 1 refills | Status: DC

## 2018-03-21 MED ORDER — PAROXETINE HCL 40 MG PO TABS: 40.0000 mg | ORAL_TABLET | ORAL | 0 refills | Status: DC

## 2018-03-21 MED ORDER — ALPRAZOLAM 1 MG PO TABS: 1.0000 mg | ORAL_TABLET | Freq: Two times a day (BID) | ORAL | 5 refills | Status: DC | PRN

## 2018-03-21 MED ORDER — FLUCONAZOLE 200 MG PO TABS: 200.0000 mg | ORAL_TABLET | Freq: Every day | ORAL | 1 refills | Status: DC

## 2018-03-21 MED ORDER — LISINOPRIL 10 MG PO TABS: 10.0000 mg | ORAL_TABLET | Freq: Every day | ORAL | 0 refills | Status: DC

## 2018-03-21 MED ORDER — NYSTATIN 100000 UNIT/ML MT SUSP: 5.0000 mL | Freq: Four times a day (QID) | OROMUCOSAL | 5 refills | Status: DC | PRN

## 2018-03-21 NOTE — Addendum Note (Signed)
Addended by: Chevis Pretty on: 03/21/2018 12:57 PM   Modules accepted: Orders

## 2018-03-21 NOTE — Patient Instructions (Signed)
Oral Thrush, Adult  Oral thrush is an infection in your mouth and throat. It causes white patches on your tongue and in your mouth. Follow these instructions at home: Helping with soreness   To lessen your pain: ? Drink cold liquids, like water and iced tea. ? Eat frozen ice pops or frozen juices. ? Eat foods that are easy to swallow, like gelatin and ice cream. ? Drink from a straw if the patches in your mouth are painful. General instructions  Take or use over-the-counter and prescription medicines only as told by your doctor. Medicine for oral thrush may be something to swallow, or it may be something to put on the infected area.  Eat plain yogurt that has live cultures in it. Read the label to make sure.  If you wear dentures: ? Take out your dentures before you go to bed. ? Brush them well. ? Soak them in a denture cleaner.  Rinse your mouth with warm salt-water many times a day. To make the salt-water mixture, completely dissolve 1/2-1 teaspoon of salt in 1 cup of warm water. Contact a doctor if:  Your problems are getting worse.  Your problems do not get better in less than 7 days with treatment.  Your infection is spreading. This may show as white patches on the skin outside of your mouth.  You are nursing your baby and you have redness and pain in the nipples. This information is not intended to replace advice given to you by your health care provider. Make sure you discuss any questions you have with your health care provider. Document Released: 06/14/2009 Document Revised: 12/13/2015 Document Reviewed: 12/13/2015 Elsevier Interactive Patient Education  2019 Elsevier Inc.  

## 2018-03-21 NOTE — Progress Notes (Signed)
Subjective:    Patient ID: [name], female    DOB: Dec 05, 1975, 42 y.o.   MRN: [id number]   Chief Complaint: medical management of chronic issues  HPI:  1. Bipolar affective disorder, currently manic, severe, with psychotic features ([place])  Patient is on seroquel and paxil. She is under a lot of stress at home with her son whom she says has lots of mental issues and refuses to get help. Has not been gong to ounseling. Depression screen [facility] 2/9 12/17/2017 11/05/2017  Decreased Interest 0 3  Down, Depressed, Hopeless 1 3  PHQ - 2 Score 1 6  Altered sleeping - 1  Tired, decreased energy - 2  Change in appetite - 2  Feeling bad or failure about yourself  - 3  Trouble concentrating - 3  Moving slowly or fidgety/restless - 3  Suicidal thoughts - 0  PHQ-9 Score - 20     2. Essential hypertension  No c/o chest pain, sob or headache. Does not check blood pressure at home..    Outpatient Encounter Medications as of 03/21/2018  Medication Sig  . ALPRAZolam (XANAX) 0.5 MG tablet Take 1 tablet (0.5 mg total) by mouth 2 (two) times daily as needed for anxiety.  . fluconazole (DIFLUCAN) 200 MG tablet Take 1 tablet (200 mg total) by mouth daily.  Marland Kitchen HYDROcodone-acetaminophen (NORCO) 10-325 MG per tablet Take 1 tablet by mouth 4 (four) times daily.  Marland Kitchen lisinopril (PRINIVIL,ZESTRIL) 10 MG tablet Take 1 tablet (10 mg total) by mouth daily.  . magic mouthwash SOLN Take 5 mLs by mouth 4 (four) times daily.  Marland Kitchen PARoxetine (PAXIL) 40 MG tablet Take 1 tablet (40 mg total) by mouth every morning.  . polyethylene glycol (MIRALAX / GLYCOLAX) packet Take 17 g by mouth daily.  . QUEtiapine (SEROQUEL) 300 MG tablet Take 2 tablets (600 mg total) by mouth at bedtime.     New complaints: -Still having frequent thrush. Not sure aht is causing it but seems to have all the time. - has a case worker who called and told us she needs to see GYN for menopause. Had hysterectomy. She is mainly c/o vaginal  dryness.  Social history: Lives with her husband and 2 chikdren   Review of Systems  Constitutional: Negative for activity change and appetite change.  HENT: Negative.        Cracking at corners of mouth  Eyes: Negative for pain.  Respiratory: Negative for shortness of breath.   Cardiovascular: Negative for chest pain, palpitations and leg swelling.  Gastrointestinal: Negative for abdominal pain.  Endocrine: Negative for polydipsia.  Genitourinary: Negative.   Skin: Negative for rash.  Neurological: Negative for dizziness, weakness and headaches.  Hematological: Does not bruise/bleed easily.  Psychiatric/Behavioral: Negative.   All other systems reviewed and are negative.      Objective:   Physical Exam Vitals signs and nursing note reviewed.  Constitutional:      General: She is not in acute distress.    Appearance: Normal appearance. She is well-developed and normal weight.  HENT:     Head: Normocephalic.     Nose: Nose normal.  Eyes:     Pupils: Pupils are equal, round, and reactive to light.  Neck:     Musculoskeletal: Normal range of motion and neck supple.     Vascular: No carotid bruit or JVD.  Cardiovascular:     Rate and Rhythm: Normal rate and regular rhythm.     Heart sounds: Normal heart sounds.  Pulmonary:     Effort: Pulmonary effort is normal. No respiratory distress.     Breath sounds: Normal breath sounds. No wheezing or rales.  Chest:     Chest wall: No tenderness.  Abdominal:     General: Bowel sounds are normal. There is no distension or abdominal bruit.     Palpations: Abdomen is soft. There is no hepatomegaly, splenomegaly, mass or pulsatile mass.     Tenderness: There is no abdominal tenderness.  Musculoskeletal: Normal range of motion.  Lymphadenopathy:     Cervical: No cervical adenopathy.  Skin:    General: Skin is warm and dry.  Neurological:     Mental Status: She is alert and oriented to person, place, and time.     Deep Tendon  Reflexes: Reflexes are normal and symmetric.  Psychiatric:        Behavior: Behavior normal.        Thought Content: Thought content normal.        Judgment: Judgment normal.    BP 104/77   Pulse (!) 103   Temp 98.1 F (36.7 C) (Oral)   Ht 5\' 2"  (1.575 m)   Wt 126 lb (57.2 kg)   BMI 23.05 kg/m       Assessment & Plan:  [name] in today with chief complaint of Medical Management of Chronic Issues   1. Bipolar affective disorder, currently manic, severe, with psychotic features ([place]) Start counseling as soon as can - QUEtiapine (SEROQUEL) 300 MG tablet; Take 2 tablets (600 mg total) by mouth at bedtime.  Dispense: 180 tablet; Refill: 1 - PARoxetine (PAXIL) 40 MG tablet; Take 1 tablet (40 mg total) by mouth every morning.  Dispense: 90 tablet; Refill: 0  2. Essential hypertension Low sodiumd diet - lisinopril (PRINIVIL,ZESTRIL) 10 MG tablet; Take 1 tablet (10 mg total) by mouth daily.  Dispense: 90 tablet; Refill: 0  3. Vaginal dryness Will schedule for PAP  4. Oral candidiasis Take diflucan only when has occurence - fluconazole (DIFLUCAN) 200 MG tablet; Take 1 tablet (200 mg total) by mouth daily.  Dispense: 14 tablet; Refill: 1  5. Angular cheilitis - fluconazole (DIFLUCAN) 200 MG tablet; Take 1 tablet (200 mg total) by mouth daily.  Dispense: 14 tablet; Refill: 1 - nystatin solution - swish and swallow qid prn   [name], FNP

## 2018-04-08 ENCOUNTER — Encounter: Payer: Self-pay | Admitting: Family Medicine

## 2018-04-08 ENCOUNTER — Ambulatory Visit (INDEPENDENT_AMBULATORY_CARE_PROVIDER_SITE_OTHER): Payer: Medicare Other | Admitting: Family Medicine

## 2018-04-08 DIAGNOSIS — J0101 Acute recurrent maxillary sinusitis: Secondary | ICD-10-CM

## 2018-04-08 DIAGNOSIS — J069 Acute upper respiratory infection, unspecified: Secondary | ICD-10-CM | POA: Diagnosis not present

## 2018-04-08 MED ORDER — DOXYCYCLINE MONOHYDRATE 100 MG PO CAPS: 100.0000 mg | ORAL_CAPSULE | Freq: Two times a day (BID) | ORAL | 0 refills | Status: DC

## 2018-04-08 MED ORDER — PSEUDOEPH-BROMPHEN-DM 30-2-10 MG/5ML PO SYRP: 5.0000 mL | ORAL_SOLUTION | Freq: Four times a day (QID) | ORAL | 0 refills | Status: DC | PRN

## 2018-04-08 MED ORDER — CETIRIZINE HCL 10 MG PO TABS: 10.0000 mg | ORAL_TABLET | Freq: Every day | ORAL | 11 refills | Status: DC

## 2018-04-08 NOTE — Patient Instructions (Signed)
Place upper respiratory infection patient instructions here.   Sinusitis, Adult Sinusitis is inflammation of your sinuses. Sinuses are hollow spaces in the bones around your face. Your sinuses are located:  Around your eyes.  In the middle of your forehead.  Behind your nose.  In your cheekbones. Mucus normally drains out of your sinuses. When your nasal tissues become inflamed or swollen, mucus can become trapped or blocked. This allows bacteria, viruses, and fungi to grow, which leads to infection. Most infections of the sinuses are caused by a virus. Sinusitis can develop quickly. It can last for up to 4 weeks (acute) or for more than 12 weeks (chronic). Sinusitis often develops after a cold. What are the causes? This condition is caused by anything that creates swelling in the sinuses or stops mucus from draining. This includes:  Allergies.  Asthma.  Infection from bacteria or viruses.  Deformities or blockages in your nose or sinuses.  Abnormal growths in the nose (nasal polyps).  Pollutants, such as chemicals or irritants in the air.  Infection from fungi (rare). What increases the risk? You are more likely to develop this condition if you:  Have a weak body defense system (immune system).  Do a lot of swimming or diving.  Overuse nasal sprays.  Smoke. What are the signs or symptoms? The main symptoms of this condition are pain and a feeling of pressure around the affected sinuses. Other symptoms include:  Stuffy nose or congestion.  Thick drainage from your nose.  Swelling and warmth over the affected sinuses.  Headache.  Upper toothache.  A cough that may get worse at night.  Extra mucus that collects in the throat or the back of the nose (postnasal drip).  Decreased sense of smell and taste.  Fatigue.  A fever.  Sore throat.  Bad breath. How is this diagnosed? This condition is diagnosed based on:  Your symptoms.  Your medical  history.  A physical exam.  Tests to find out if your condition is acute or chronic. This may include: ? Checking your nose for nasal polyps. ? Viewing your sinuses using a device that has a light (endoscope). ? Testing for allergies or bacteria. ? Imaging tests, such as an MRI or CT scan. In rare cases, a bone biopsy may be done to rule out more serious types of fungal sinus disease. How is this treated? Treatment for sinusitis depends on the cause and whether your condition is chronic or acute.  If caused by a virus, your symptoms should go away on their own within 10 days. You may be given medicines to relieve symptoms. They include: ? Medicines that shrink swollen nasal passages (topical intranasal decongestants). ? Medicines that treat allergies (antihistamines). ? A spray that eases inflammation of the nostrils (topical intranasal corticosteroids). ? Rinses that help get rid of thick mucus in your nose (nasal saline washes).  If caused by bacteria, your health care provider may recommend waiting to see if your symptoms improve. Most bacterial infections will get better without antibiotic medicine. You may be given antibiotics if you have: ? A severe infection. ? A weak immune system.  If caused by narrow nasal passages or nasal polyps, you may need to have surgery. Follow these instructions at home: Medicines  Take, use, or apply over-the-counter and prescription medicines only as told by your health care provider. These may include nasal sprays.  If you were prescribed an antibiotic medicine, take it as told by your health care provider. Do   not stop taking the antibiotic even if you start to feel better. Hydrate and humidify   Drink enough fluid to keep your urine pale yellow. Staying hydrated will help to thin your mucus.  Use a cool mist humidifier to keep the humidity level in your home above 50%.  Inhale steam for 10-15 minutes, 3-4 times a day, or as told by your  health care provider. You can do this in the bathroom while a hot shower is running.  Limit your exposure to cool or dry air. Rest  Rest as much as possible.  Sleep with your head raised (elevated).  Make sure you get enough sleep each night. General instructions   Apply a warm, moist washcloth to your face 3-4 times a day or as told by your health care provider. This will help with discomfort.  Wash your hands often with soap and water to reduce your exposure to germs. If soap and water are not available, use hand sanitizer.  Do not smoke. Avoid being around people who are smoking (secondhand smoke).  Keep all follow-up visits as told by your health care provider. This is important. Contact a health care provider if:  You have a fever.  Your symptoms get worse.  Your symptoms do not improve within 10 days. Get help right away if:  You have a severe headache.  You have persistent vomiting.  You have severe pain or swelling around your face or eyes.  You have vision problems.  You develop confusion.  Your neck is stiff.  You have trouble breathing. Summary  Sinusitis is soreness and inflammation of your sinuses. Sinuses are hollow spaces in the bones around your face.  This condition is caused by nasal tissues that become inflamed or swollen. The swelling traps or blocks the flow of mucus. This allows bacteria, viruses, and fungi to grow, which leads to infection.  If you were prescribed an antibiotic medicine, take it as told by your health care provider. Do not stop taking the antibiotic even if you start to feel better.  Keep all follow-up visits as told by your health care provider. This is important. This information is not intended to replace advice given to you by your health care provider. Make sure you discuss any questions you have with your health care provider. Document Released: 03/20/2005 Document Revised: 08/20/2017 Document Reviewed:  08/20/2017 Elsevier Interactive Patient Education  2019 [employer] Inc[employer]  

## 2018-04-08 NOTE — Progress Notes (Signed)
Subjective:     [name] is a 43 y.o. female who presents for evaluation of symptoms of a URI, possible sinusitis. Symptoms include bilateral ear pressure/pain, achiness, congestion, coryza, cough described as productive, facial pain, low grade fever, nasal congestion, post nasal drip, productive cough with  yellow, green and tan colored sputum, purulent nasal discharge and sinus pressure. Onset of symptoms was 8 days ago, and has been gradually worsening since that time. Treatment to date: antihistamines, cough suppressants and saline nasal spray.  The following portions of the patient's history were reviewed and updated as appropriate: allergies, current medications, past family history, past medical history, past social history, past surgical history and problem list.  Review of Systems Constitutional: positive for chills, fatigue, fevers and malaise Eyes: negative Ears, nose, mouth, throat, and face: positive for nasal congestion, sore throat and ear fullness, postnasal drainage, rhinorrhea Respiratory: positive for cough and sputum Cardiovascular: negative Musculoskeletal:positive for myalgias Neurological: positive for headaches   Objective:    BP 134/78   Pulse 95   Temp 98.3 F (36.8 C) (Oral)   Ht 5\' 2"  (1.575 m)   Wt 123 lb (55.8 kg)   BMI 22.50 kg/m  General appearance: alert, cooperative, appears older than stated age and mild distress Head: Normocephalic, without obvious abnormality, atraumatic, sinuses tender to percussion Eyes: negative Ears: normal TM's and external ear canals both ears Nose: Nares normal. Septum midline. Mucosa normal. No drainage or sinus tenderness., purulent and scant discharge, mild congestion, turbinates red, swollen, mild maxillary sinus tenderness bilateral Throat: abnormal findings: mild oropharyngeal erythema Neck: no adenopathy, no carotid bruit, no JVD, supple, symmetrical, trachea midline and thyroid not enlarged, symmetric,  no tenderness/mass/nodules Lungs: rhonchi LLL, RLL and mild Heart: regular rate and rhythm, S1, S2 normal, no murmur, click, rub or gallop Skin: Skin color, texture, turgor normal. No rashes or lesions Lymph nodes: Cervical, supraclavicular, and axillary nodes normal. Neurologic: Grossly normal   Assessment:     Delenn was seen today for uri.  Diagnoses and all orders for this visit:  Acute recurrent maxillary sinusitis Increase fluid intake. Frequent saline nasal spray. Continue zyrtec. Avoid cigarette smoke. Medications as prescribed. Report any new or worsening symptoms.  -     doxycycline (MONODOX) 100 MG capsule; Take 1 capsule (100 mg total) by mouth 2 (two) times daily for 7 days. -     cetirizine (ZYRTEC) 10 MG tablet; Take 1 tablet (10 mg total) by mouth daily.  URI with cough and congestion Increase fluid intake. Increase humidity in the air. Avoid cigarette smoke. Medications as prescribed. Report any new or worsening symptoms.  -     brompheniramine-pseudoephedrine-DM 30-2-10 MG/5ML syrup; Take 5 mLs by mouth 4 (four) times daily as needed.    Plan:    Discussed diagnosis and treatment of URI. Discussed the diagnosis and treatment of sinusitis. Suggested symptomatic OTC remedies. Nasal saline spray for congestion. doxycycline per orders. Follow up as needed. Call in 3 days if symptoms aren't resolving. Follow up with PCP as needed.    The above assessment and management plan was discussed with the patient. The patient verbalized understanding of and has agreed to the management plan. Patient is aware to call the clinic if symptoms fail to improve or worsen. Patient is aware when to return to the clinic for a follow-up visit. Patient educated on when it is appropriate to go to the emergency department.   Monia Pouch, FNP-C [place]  [address] [place], [place] [address] [phone]

## 2018-04-09 ENCOUNTER — Other Ambulatory Visit: Payer: Self-pay | Admitting: Family Medicine

## 2018-04-09 ENCOUNTER — Telehealth: Payer: Self-pay | Admitting: Nurse Practitioner

## 2018-04-09 DIAGNOSIS — R112 Nausea with vomiting, unspecified: Secondary | ICD-10-CM

## 2018-04-09 DIAGNOSIS — J0101 Acute recurrent maxillary sinusitis: Secondary | ICD-10-CM

## 2018-04-09 MED ORDER — ONDANSETRON HCL 4 MG PO TABS: 4.0000 mg | ORAL_TABLET | Freq: Three times a day (TID) | ORAL | 0 refills | Status: DC | PRN

## 2018-04-09 MED ORDER — CEFIXIME 400 MG PO CAPS: 400.0000 mg | ORAL_CAPSULE | Freq: Every day | ORAL | 0 refills | Status: AC

## 2018-04-09 NOTE — Telephone Encounter (Signed)
Pt aware.

## 2018-04-09 NOTE — Telephone Encounter (Signed)
Will change antibiotic and call in Zofran, not pheneran

## 2018-04-22 ENCOUNTER — Ambulatory Visit: Payer: Medicare Other | Admitting: Nurse Practitioner

## 2018-04-25 ENCOUNTER — Encounter: Payer: Self-pay | Admitting: Nurse Practitioner

## 2018-04-25 ENCOUNTER — Ambulatory Visit (INDEPENDENT_AMBULATORY_CARE_PROVIDER_SITE_OTHER): Payer: Medicare Other | Admitting: Nurse Practitioner

## 2018-04-25 VITALS — BP 105/74 | HR 82 | Temp 96.5°F | Ht 62.0 in | Wt 121.0 lb

## 2018-04-25 DIAGNOSIS — Z0000 Encounter for general adult medical examination without abnormal findings: Secondary | ICD-10-CM

## 2018-04-25 DIAGNOSIS — I10 Essential (primary) hypertension: Secondary | ICD-10-CM | POA: Diagnosis not present

## 2018-04-25 DIAGNOSIS — F312 Bipolar disorder, current episode manic severe with psychotic features: Secondary | ICD-10-CM | POA: Diagnosis not present

## 2018-04-25 DIAGNOSIS — Z0001 Encounter for general adult medical examination with abnormal findings: Secondary | ICD-10-CM | POA: Diagnosis not present

## 2018-04-25 MED ORDER — PAROXETINE HCL 40 MG PO TABS: 40.0000 mg | ORAL_TABLET | ORAL | 1 refills | Status: DC

## 2018-04-25 MED ORDER — LISINOPRIL 10 MG PO TABS: 10.0000 mg | ORAL_TABLET | Freq: Every day | ORAL | 0 refills | Status: DC

## 2018-04-25 MED ORDER — QUETIAPINE FUMARATE 300 MG PO TABS: 600.0000 mg | ORAL_TABLET | Freq: Every day | ORAL | 1 refills | Status: DC

## 2018-04-25 NOTE — Patient Instructions (Signed)

## 2018-04-25 NOTE — Progress Notes (Signed)
Subjective:    Patient ID: [name], female    DOB: January 09, 1976, 43 y.o.   MRN: [id number]   Chief Complaint: medical management of chronic issues  HPI:  1. Essential hypertension  No c/o chest pain, sob or headache. Does not check  Blood pressure at  Home.  2. Annual physical exam  Last pap was > 3 years ago  3. Bipolar affective disorder, currently manic, severe, with psychotic features ([place])  She has had issues with bipolar for many years. She see psych. She is currently on combination of seroquel and paxil. She says she is doing well. She is unable to work but does keep her grandchildren while her step daughter works.    Outpatient Encounter Medications as of 04/25/2018  Medication Sig  . ALPRAZolam (XANAX) 1 MG tablet Take 1 tablet (1 mg total) by mouth 2 (two) times daily as needed for anxiety.  . brompheniramine-pseudoephedrine-DM 30-2-10 MG/5ML syrup Take 5 mLs by mouth 4 (four) times daily as needed.  . cetirizine (ZYRTEC) 10 MG tablet Take 1 tablet (10 mg total) by mouth daily.  Marland Kitchen HYDROcodone-acetaminophen (NORCO) 10-325 MG per tablet Take 1 tablet by mouth 4 (four) times daily.  Marland Kitchen lisinopril (PRINIVIL,ZESTRIL) 10 MG tablet Take 1 tablet (10 mg total) by mouth daily.  Marland Kitchen nystatin (MYCOSTATIN) 100000 UNIT/ML suspension Take 5 mLs (500,000 Units total) by mouth 4 (four) times daily as needed.  . ondansetron (ZOFRAN) 4 MG tablet Take 1 tablet (4 mg total) by mouth every 8 (eight) hours as needed for nausea or vomiting.  Marland Kitchen PARoxetine (PAXIL) 40 MG tablet Take 1 tablet (40 mg total) by mouth every morning.  . polyethylene glycol (MIRALAX / GLYCOLAX) packet Take 17 g by mouth daily.  . QUEtiapine (SEROQUEL) 300 MG tablet Take 2 tablets (600 mg total) by mouth at bedtime.      New complaints: None today   Social history: She is still having a lot of problems with her son. She says he just stays at home and refuses to work. Yells a lot at her.  Review of Systems    Constitutional: Negative for activity change and appetite change.  HENT: Negative.   Eyes: Negative for pain.  Respiratory: Negative for shortness of breath.   Cardiovascular: Negative for chest pain, palpitations and leg swelling.  Gastrointestinal: Negative for abdominal pain.  Endocrine: Negative for polydipsia.  Genitourinary: Negative.   Skin: Negative for rash.  Neurological: Negative for dizziness, weakness and headaches.  Hematological: Does not bruise/bleed easily.  Psychiatric/Behavioral: Negative.   All other systems reviewed and are negative.      Objective:   Physical Exam Vitals signs and nursing note reviewed.  Constitutional:      General: She is not in acute distress.    Appearance: Normal appearance. She is well-developed.  HENT:     Head: Normocephalic.     Nose: Nose normal.  Eyes:     Pupils: Pupils are equal, round, and reactive to light.  Neck:     Musculoskeletal: Normal range of motion and neck supple.     Vascular: No carotid bruit or JVD.  Cardiovascular:     Rate and Rhythm: Normal rate and regular rhythm.     Heart sounds: Normal heart sounds.  Pulmonary:     Effort: Pulmonary effort is normal. No respiratory distress.     Breath sounds: Normal breath sounds. No wheezing or rales.  Chest:     Chest wall: No tenderness.  Abdominal:  General: Bowel sounds are normal. There is no distension or abdominal bruit.     Palpations: Abdomen is soft. There is no hepatomegaly, splenomegaly, mass or pulsatile mass.     Tenderness: There is no abdominal tenderness.  Musculoskeletal: Normal range of motion.  Lymphadenopathy:     Cervical: No cervical adenopathy.  Skin:    General: Skin is warm and dry.  Neurological:     Mental Status: She is alert and oriented to person, place, and time.     Deep Tendon Reflexes: Reflexes are normal and symmetric.  Psychiatric:        Behavior: Behavior normal.        Thought Content: Thought content normal.         Judgment: Judgment normal.    BP 105/74   Pulse 82   Temp (!) 96.5 F (35.8 C) (Oral)   Ht 5' 2" (1.575 m)   Wt 121 lb (54.9 kg)   BMI 22.13 kg/m      Assessment & Plan:  [name] comes in today with chief complaint of Annual Exam   Diagnosis and orders addressed:  1. Annual physical exam - CBC with Differential/Platelet - Thyroid Panel With TSH - IGP, Aptima HPV, rfx 16/18,45 - STD Screen (8)  2. Essential hypertension Low sodium diet - CMP14+EGFR - Lipid panel - lisinopril (PRINIVIL,ZESTRIL) 10 MG tablet; Take 1 tablet (10 mg total) by mouth daily.  Dispense: 90 tablet; Refill: 0  3. Bipolar affective disorder, currently manic, severe, with psychotic features ([place]) Keep follow up with psych   Labs pending Health Maintenance reviewed Diet and exercise encouraged  Follow up plan: 6 months   [place]di, FNP

## 2018-04-26 LAB — CBC WITH DIFFERENTIAL/PLATELET
Basophils Absolute: 0 10*3/uL (ref 0.0–0.2)
Basos: 0 %
EOS (ABSOLUTE): 0.1 10*3/uL (ref 0.0–0.4)
Eos: 1 %
Hematocrit: 41.1 % (ref 34.0–46.6)
Hemoglobin: 14 g/dL (ref 11.1–15.9)
Immature Grans (Abs): 0 10*3/uL (ref 0.0–0.1)
Immature Granulocytes: 0 %
Lymphocytes Absolute: 3.2 10*3/uL — ABNORMAL HIGH (ref 0.7–3.1)
Lymphs: 41 %
MCH: 30.6 pg (ref 26.6–33.0)
MCHC: 34.1 g/dL (ref 31.5–35.7)
MCV: 90 fL (ref 79–97)
Monocytes Absolute: 0.6 10*3/uL (ref 0.1–0.9)
Monocytes: 7 %
Neutrophils Absolute: 3.9 10*3/uL (ref 1.4–7.0)
Neutrophils: 51 %
PLATELETS: 210 10*3/uL (ref 150–450)
RBC: 4.57 x10E6/uL (ref 3.77–5.28)
RDW: 13.9 % (ref 11.7–15.4)
WBC: 7.8 10*3/uL (ref 3.4–10.8)

## 2018-04-26 LAB — LIPID PANEL
Chol/HDL Ratio: 6.2 ratio — ABNORMAL HIGH (ref 0.0–4.4)
Cholesterol, Total: 222 mg/dL — ABNORMAL HIGH (ref 100–199)
HDL: 36 mg/dL — ABNORMAL LOW (ref >39)
LDL Calculated: 137 mg/dL — ABNORMAL HIGH (ref 0–99)
Triglycerides: 245 mg/dL — ABNORMAL HIGH (ref 0–149)
VLDL Cholesterol Cal: 49 mg/dL — ABNORMAL HIGH (ref 5–40)

## 2018-04-26 LAB — CMP14+EGFR
A/G RATIO: 1.8 (ref 1.2–2.2)
ALT: 7 IU/L (ref 0–32)
AST: 19 IU/L (ref 0–40)
Albumin: 4.4 g/dL (ref 3.8–4.8)
Alkaline Phosphatase: 118 IU/L — ABNORMAL HIGH (ref 39–117)
BUN/Creatinine Ratio: 10 (ref 9–23)
BUN: 8 mg/dL (ref 6–24)
Bilirubin Total: <0.2 mg/dL (ref 0.0–1.2)
CO2: 26 mmol/L (ref 20–29)
Calcium: 9.7 mg/dL (ref 8.7–10.2)
Chloride: 99 mmol/L (ref 96–106)
Creatinine, Ser: 0.82 mg/dL (ref 0.57–1.00)
GFR calc Af Amer: 102 mL/min/{1.73_m2} (ref >59)
GFR calc non Af Amer: 89 mL/min/{1.73_m2} (ref >59)
Globulin, Total: 2.4 g/dL (ref 1.5–4.5)
Glucose: 91 mg/dL (ref 65–99)
Potassium: 4.5 mmol/L (ref 3.5–5.2)
Sodium: 141 mmol/L (ref 134–144)
Total Protein: 6.8 g/dL (ref 6.0–8.5)

## 2018-04-26 LAB — STD SCREEN (8)
HIV Screen 4th Generation wRfx: NONREACTIVE
HSV 1 Glycoprotein G Ab, IgG: 38.7 index — ABNORMAL HIGH (ref 0.00–0.90)
Hep A IgM: NEGATIVE
Hep B C IgM: NEGATIVE
Hepatitis B Surface Ag: NEGATIVE
RPR Ser Ql: NONREACTIVE

## 2018-04-26 LAB — THYROID PANEL WITH TSH
Free Thyroxine Index: 1.3 (ref 1.2–4.9)
T3 Uptake Ratio: 24 % (ref 24–39)
T4, Total: 5.5 ug/dL (ref 4.5–12.0)
TSH: 1.26 u[IU]/mL (ref 0.450–4.500)

## 2018-04-29 ENCOUNTER — Telehealth: Payer: Self-pay | Admitting: Nurse Practitioner

## 2018-04-29 NOTE — Telephone Encounter (Signed)
Patient aware.

## 2018-04-29 NOTE — Telephone Encounter (Signed)
PT is wanting to speak to [facility] nurse about her congestion, thick mucus, and heavy head, saw Rakes at beginning of month for this, tried to take some of that medicine she was given and it has not helped

## 2018-04-29 NOTE — Telephone Encounter (Signed)
Is just viral - treat with OTC meds and let patient know this could last a few more days.

## 2018-04-30 ENCOUNTER — Ambulatory Visit (INDEPENDENT_AMBULATORY_CARE_PROVIDER_SITE_OTHER): Payer: Medicare Other | Admitting: Nurse Practitioner

## 2018-04-30 ENCOUNTER — Encounter: Payer: Self-pay | Admitting: Nurse Practitioner

## 2018-04-30 VITALS — BP 101/69 | HR 93 | Temp 97.5°F | Ht 62.0 in | Wt 120.0 lb

## 2018-04-30 DIAGNOSIS — J069 Acute upper respiratory infection, unspecified: Secondary | ICD-10-CM | POA: Diagnosis not present

## 2018-04-30 MED ORDER — PREDNISONE 20 MG PO TABS: ORAL_TABLET | ORAL | 0 refills | Status: DC

## 2018-04-30 MED ORDER — BENZONATATE 100 MG PO CAPS: 100.0000 mg | ORAL_CAPSULE | Freq: Three times a day (TID) | ORAL | 0 refills | Status: DC | PRN

## 2018-04-30 NOTE — Patient Instructions (Signed)

## 2018-04-30 NOTE — Progress Notes (Signed)
Subjective:    Patient ID: [name], female    DOB: 08-17-1975, 43 y.o.   MRN: [id number]   Chief Complaint: Cough; Nasal Congestion; and Sinus Problem   HPI Patient comes in today c/o cough and congestion for over 2 weeks. She saw Rakes, NP on 04/08/18 and was given doxycycline. She says she still has tight productive cough with lots of congestion. She has had cold chills, but not sure about fever.   Review of Systems  Constitutional: Positive for chills. Negative for fever.  HENT: Positive for congestion, rhinorrhea and sinus pressure. Negative for sore throat and trouble swallowing.   Respiratory: Positive for cough. Negative for shortness of breath.   Cardiovascular: Negative.   Gastrointestinal: Negative.   Neurological: Positive for headaches.  Psychiatric/Behavioral: Negative.   All other systems reviewed and are negative.      Objective:   Physical Exam Constitutional:      General: She is in acute distress (mild).     Appearance: She is normal weight.  HENT:     Right Ear: Hearing, tympanic membrane, ear canal and external ear normal.     Left Ear: Hearing, tympanic membrane, ear canal and external ear normal.     Nose: Nose normal.     Right Sinus: No maxillary sinus tenderness or frontal sinus tenderness.     Left Sinus: No maxillary sinus tenderness or frontal sinus tenderness.     Mouth/Throat:     Lips: Pink.     Mouth: Mucous membranes are moist.     Pharynx: No posterior oropharyngeal erythema.  Neck:     Musculoskeletal: Normal range of motion and neck supple.  Cardiovascular:     Rate and Rhythm: Normal rate and regular rhythm.     Heart sounds: Normal heart sounds.  Pulmonary:     Effort: Pulmonary effort is normal.     Breath sounds: Normal breath sounds.  Abdominal:     General: Abdomen is flat.  Lymphadenopathy:     Cervical: No cervical adenopathy.  Skin:    General: Skin is warm and dry.  Neurological:     General: No focal deficit  present.     Mental Status: She is alert and oriented to person, place, and time.  Psychiatric:        Mood and Affect: Mood normal.        Behavior: Behavior normal.    BP 101/69   Pulse 93   Temp (!) 97.5 F (36.4 C) (Oral)   Ht 5\' 2"  (1.575 m)   Wt 120 lb (54.4 kg)   BMI 21.95 kg/m        Assessment & Plan:  [name] in today with chief complaint of Cough; Nasal Congestion; and Sinus Problem   1. Upper respiratory infection with cough and congestion 1. Take meds as prescribed 2. Use a cool mist humidifier especially during the winter months and when heat has been humid. 3. Use saline nose sprays frequently 4. Saline irrigations of the nose can be very helpful if done frequently.  * 4X daily for 1 week*  * Use of a nettie pot can be helpful with this. Follow directions with this* 5. Drink plenty of fluids 6. Keep thermostat turn down low 7.For any cough or congestion  Use plain Mucinex- regular strength or max strength is fine   * Children- consult with Pharmacist for dosing 8. For fever or aces or pains- take tylenol or ibuprofen appropriate for age  and weight.  * for fevers greater than 101 orally you may alternate ibuprofen and tylenol every  3 hours.    - predniSONE (DELTASONE) 20 MG tablet; 2 po at sametime daily for 5 days  Dispense: 10 tablet; Refill: 0 - benzonatate (TESSALON PERLES) 100 MG capsule; Take 1 capsule (100 mg total) by mouth 3 (three) times daily as needed for cough.  Dispense: 20 capsule; Refill: 0  [name], FNP

## 2018-05-01 LAB — IGP, APTIMA HPV, RFX 16/18,45: HPV Aptima: NEGATIVE

## 2018-05-06 ENCOUNTER — Other Ambulatory Visit: Payer: Self-pay | Admitting: Chiropractic Medicine

## 2018-05-06 ENCOUNTER — Ambulatory Visit (INDEPENDENT_AMBULATORY_CARE_PROVIDER_SITE_OTHER): Payer: Medicare Other | Admitting: Nurse Practitioner

## 2018-05-06 ENCOUNTER — Ambulatory Visit: Payer: Medicare Other | Admitting: Family Medicine

## 2018-05-06 ENCOUNTER — Encounter: Payer: Self-pay | Admitting: Nurse Practitioner

## 2018-05-06 VITALS — BP 136/89 | HR 98 | Temp 98.1°F | Ht 62.0 in | Wt 120.0 lb

## 2018-05-06 DIAGNOSIS — B3731 Acute candidiasis of vulva and vagina: Secondary | ICD-10-CM

## 2018-05-06 DIAGNOSIS — M545 Low back pain, unspecified: Secondary | ICD-10-CM

## 2018-05-06 DIAGNOSIS — R8290 Unspecified abnormal findings in urine: Secondary | ICD-10-CM | POA: Diagnosis not present

## 2018-05-06 DIAGNOSIS — B373 Candidiasis of vulva and vagina: Secondary | ICD-10-CM

## 2018-05-06 DIAGNOSIS — M542 Cervicalgia: Secondary | ICD-10-CM

## 2018-05-06 MED ORDER — FLUCONAZOLE 150 MG PO TABS: 150.0000 mg | ORAL_TABLET | Freq: Once | ORAL | 0 refills | Status: AC

## 2018-05-06 NOTE — Patient Instructions (Signed)
Vaginal Yeast infection, Adult    Vaginal yeast infection is a condition that causes vaginal discharge as well as soreness, swelling, and redness (inflammation) of the vagina. This is a common condition. Some women get this infection frequently.  What are the causes?  This condition is caused by a change in the normal balance of the yeast (candida) and bacteria that live in the vagina. This change causes an overgrowth of yeast, which causes the inflammation.  What increases the risk?  The condition is more likely to develop in women who:   Take antibiotic medicines.   Have diabetes.   Take birth control pills.   Are pregnant.   Douche often.   Have a weak body defense system (immune system).   Have been taking steroid medicines for a long time.   Frequently wear tight clothing.  What are the signs or symptoms?  Symptoms of this condition include:   White, thick, creamy vaginal discharge.   Swelling, itching, redness, and irritation of the vagina. The lips of the vagina (vulva) may be affected as well.   Pain or a burning feeling while urinating.   Pain during sex.  How is this diagnosed?  This condition is diagnosed based on:   Your medical history.   A physical exam.   A pelvic exam. Your health care provider will examine a sample of your vaginal discharge under a microscope. Your health care provider may send this sample for testing to confirm the diagnosis.  How is this treated?  This condition is treated with medicine. Medicines may be over-the-counter or prescription. You may be told to use one or more of the following:   Medicine that is taken by mouth (orally).   Medicine that is applied as a cream (topically).   Medicine that is inserted directly into the vagina (suppository).  Follow these instructions at home:    Lifestyle   Do not have sex until your health care provider approves. Tell your sex partner that you have a yeast infection. That person should go to his or her health care  provider and ask if they should also be treated.   Do not wear tight clothes, such as pantyhose or tight pants.   Wear breathable cotton underwear.  General instructions   Take or apply over-the-counter and prescription medicines only as told by your health care provider.   Eat more yogurt. This may help to keep your yeast infection from returning.   Do not use tampons until your health care provider approves.   Try taking a sitz bath to help with discomfort. This is a warm water bath that is taken while you are sitting down. The water should only come up to your hips and should cover your buttocks. Do this 3-4 times per day or as told by your health care provider.   Do not douche.   If you have diabetes, keep your blood sugar levels under control.   Keep all follow-up visits as told by your health care provider. This is important.  Contact a health care provider if:   You have a fever.   Your symptoms go away and then return.   Your symptoms do not get better with treatment.   Your symptoms get worse.   You have new symptoms.   You develop blisters in or around your vagina.   You have blood coming from your vagina and it is not your menstrual period.   You develop pain in your abdomen.  Summary     Vaginal yeast infection is a condition that causes discharge as well as soreness, swelling, and redness (inflammation) of the vagina.   This condition is treated with medicine. Medicines may be over-the-counter or prescription.   Take or apply over-the-counter and prescription medicines only as told by your health care provider.   Do not douche. Do not have sex or use tampons until your health care provider approves.   Contact a health care provider if your symptoms do not get better with treatment or your symptoms go away and then return.  This information is not intended to replace advice given to you by your health care provider. Make sure you discuss any questions you have with your health care  provider.  Document Released: 12/28/2004 Document Revised: 08/06/2017 Document Reviewed: 08/06/2017  Elsevier Interactive Patient Education  2019 [employer]

## 2018-05-06 NOTE — Progress Notes (Signed)
   Subjective:    Patient ID: [name], female    DOB: 16-May-1975, 43 y.o.   MRN: [id number]   Chief Complaint: foul smelling urine and vaginal issues   HPI Patient comes in today c/o foul smelling urine and vaginal discharge. She has not had sex any since her last exam. Denies any burning, slight itching yesterday. She says she feels a gush of fluid when sitting doing nothing.     Review of Systems  Constitutional: Negative.   HENT: Negative.   Respiratory: Negative.   Cardiovascular: Negative.   Gastrointestinal: Negative.   Genitourinary: Positive for vaginal discharge. Negative for dysuria.  Neurological: Negative.   Psychiatric/Behavioral: Negative.   All other systems reviewed and are negative.      Objective:   Physical Exam Vitals signs and nursing note reviewed.  Constitutional:      General: She is not in acute distress.    Appearance: She is normal weight.  Cardiovascular:     Rate and Rhythm: Normal rate and regular rhythm.     Heart sounds: Normal heart sounds.  Pulmonary:     Effort: Pulmonary effort is normal.     Breath sounds: Normal breath sounds.  Abdominal:     General: Abdomen is flat.     Palpations: Abdomen is soft.     Tenderness: There is no abdominal tenderness.  Genitourinary:    General: Normal vulva.     Vagina: Vaginal discharge (thick white and cheesy) present.     Rectum: Normal.  Skin:    General: Skin is warm and dry.  Neurological:     General: No focal deficit present.     Mental Status: She is alert and oriented to person, place, and time.     BP 136/89   Pulse 98   Temp 98.1 F (36.7 C) (Oral)   Ht 5\' 2"  (1.575 m)   Wt 120 lb (54.4 kg)   BMI 21.95 kg/m   Urine clear Wet prep- yeast     Assessment & Plan:  ZORIANA OATS in today with chief complaint of foul smelling urine and vaginal issues   1. Foul smelling urine Force fluids - Urinalysis, Complete - WET PREP FOR TRICH, YEAST, CLUE  2.  Vaginal candidiasis No bubble baths Avoid scratching- can use mionistat OTC for itchiing - fluconazole (DIFLUCAN) 150 MG tablet; Take 1 tablet (150 mg total) by mouth once for 1 dose.  Dispense: 1 tablet; Refill: 0  [name], FNP

## 2018-05-07 ENCOUNTER — Telehealth: Payer: Self-pay | Admitting: *Deleted

## 2018-05-07 LAB — MICROSCOPIC EXAMINATION
Bacteria, UA: NONE SEEN
Epithelial Cells (non renal): >10 /hpf — AB (ref 0–10)
RBC, UA: NONE SEEN /hpf (ref 0–2)
Renal Epithel, UA: NONE SEEN /hpf

## 2018-05-07 LAB — URINALYSIS, COMPLETE
Bilirubin, UA: NEGATIVE
Glucose, UA: NEGATIVE
Ketones, UA: NEGATIVE
Leukocytes, UA: NEGATIVE
Nitrite, UA: NEGATIVE
Protein, UA: NEGATIVE
RBC, UA: NEGATIVE
Specific Gravity, UA: 1.01 (ref 1.005–1.030)
Urobilinogen, Ur: 0.2 mg/dL (ref 0.2–1.0)
pH, UA: 7 (ref 5.0–7.5)

## 2018-05-07 LAB — WET PREP FOR TRICH, YEAST, CLUE
Clue Cell Exam: NEGATIVE
Trichomonas Exam: NEGATIVE
Yeast Exam: POSITIVE — AB

## 2018-05-07 NOTE — Telephone Encounter (Signed)
Thinks her blood pressure was up but first said she did not have a machine to check it and then said she did but didn't trust it.  Never gave me any numbers but ask to take half a blood presure pill.  I advised to go to ED if bad headache,dizziness, numbness or other severe symptoms arise.  She really did not give me any details.    She would like provider to order blood pressure machine to see if insurance would pay.

## 2018-05-09 NOTE — Telephone Encounter (Signed)
Insurance will not pay for blood pressure machine and she has not had high blood pressure at any visit

## 2018-05-14 ENCOUNTER — Other Ambulatory Visit: Payer: Self-pay

## 2018-05-14 NOTE — Telephone Encounter (Signed)
Patient aware.

## 2018-05-24 ENCOUNTER — Encounter: Payer: Self-pay | Admitting: *Deleted

## 2018-06-11 ENCOUNTER — Encounter: Payer: Self-pay | Admitting: Nurse Practitioner

## 2018-06-11 ENCOUNTER — Ambulatory Visit (INDEPENDENT_AMBULATORY_CARE_PROVIDER_SITE_OTHER): Payer: Medicare Other | Admitting: Nurse Practitioner

## 2018-06-11 VITALS — BP 111/77 | HR 87 | Temp 97.9°F | Ht 62.0 in | Wt 119.0 lb

## 2018-06-11 DIAGNOSIS — J0100 Acute maxillary sinusitis, unspecified: Secondary | ICD-10-CM | POA: Diagnosis not present

## 2018-06-11 DIAGNOSIS — F315 Bipolar disorder, current episode depressed, severe, with psychotic features: Secondary | ICD-10-CM

## 2018-06-11 MED ORDER — QUETIAPINE FUMARATE 400 MG PO TABS: 400.0000 mg | ORAL_TABLET | Freq: Two times a day (BID) | ORAL | 1 refills | Status: DC

## 2018-06-11 MED ORDER — DOXYCYCLINE HYCLATE 100 MG PO TABS: 100.0000 mg | ORAL_TABLET | Freq: Two times a day (BID) | ORAL | 0 refills | Status: DC

## 2018-06-11 NOTE — Progress Notes (Signed)
Subjective:    Patient ID: [name], female    DOB: 1975-08-05, 43 y.o.   MRN: [id number]   Chief Complaint: can't smell or taste for 1 week; Cough; and Nasal Congestion   HPI Patient comes in today c/o: - unable to smell or taste her food. She has cough and congestion that started over a week ago. - depression has worsened over the last week. Has been having nightmares. Depression screen [facility] 2/9 06/11/2018 04/25/2018 04/08/2018  Decreased Interest 3 0 0  Down, Depressed, Hopeless 3 0 0  PHQ - 2 Score 6 0 0  Altered sleeping 2 - -  Tired, decreased energy 3 - -  Change in appetite 3 - -  Feeling bad or failure about yourself  2 - -  Trouble concentrating 3 - -  Moving slowly or fidgety/restless 2 - -  Suicidal thoughts 0 - -  PHQ-9 Score 21 - -  Difficult doing work/chores Very difficult - -       Review of Systems  Constitutional: Negative for chills and fever.  HENT: Positive for congestion, rhinorrhea, sinus pressure and sinus pain. Negative for sore throat and trouble swallowing.   Respiratory: Positive for cough.   Cardiovascular: Negative.   Gastrointestinal: Negative.   Neurological: Negative.   Psychiatric/Behavioral: Negative.   All other systems reviewed and are negative.      Objective:   Physical Exam Vitals signs and nursing note reviewed.  Constitutional:      General: She is not in acute distress.    Appearance: She is normal weight.  HENT:     Right Ear: Hearing, tympanic membrane, ear canal and external ear normal.     Left Ear: Hearing, tympanic membrane, ear canal and external ear normal.     Nose: Mucosal edema, congestion and rhinorrhea present.     Right Sinus: Maxillary sinus tenderness present. No frontal sinus tenderness.     Left Sinus: Maxillary sinus tenderness present. No frontal sinus tenderness.     Mouth/Throat:     Lips: Pink.     Pharynx: Oropharynx is clear.  Neck:     Musculoskeletal: Normal range of motion and neck  supple.  Cardiovascular:     Rate and Rhythm: Normal rate and regular rhythm.     Heart sounds: Normal heart sounds.  Pulmonary:     Effort: Pulmonary effort is normal.     Breath sounds: Normal breath sounds.  Lymphadenopathy:     Cervical: No cervical adenopathy.  Skin:    General: Skin is warm and dry.  Neurological:     General: No focal deficit present.     Mental Status: She is alert and oriented to person, place, and time.  Psychiatric:        Mood and Affect: Mood normal.        Behavior: Behavior normal.     BP 111/77   Pulse 87   Temp 97.9 F (36.6 C) (Oral)   Ht 5\' 2"  (1.575 m)   Wt 119 lb (54 kg)   BMI 21.77 kg/m        Assessment & Plan:  [name] in today with chief complaint of can't smell or taste for 1 week; Cough; and Nasal Congestion   1. Bipolar disorder, current episode depressed, severe, with psychotic features ([place]) Stress management Increased seroquel from 300mg  bid to 400mg  bid - QUEtiapine (SEROQUEL) 400 MG tablet; Take 1 tablet (400 mg total) by mouth 2 (two) times  daily.  Dispense: 180 tablet; Refill: 1  2. Acute non-recurrent maxillary sinusitis 1. Take meds as prescribed 2. Use a cool mist humidifier especially during the winter months and when heat has been humid. 3. Use saline nose sprays frequently 4. Saline irrigations of the nose can be very helpful if done frequently.  * 4X daily for 1 week*  * Use of a nettie pot can be helpful with this. Follow directions with this* 5. Drink plenty of fluids 6. Keep thermostat turn down low 7.For any cough or congestion  Use plain Mucinex- regular strength or max strength is fine   * Children- consult with Pharmacist for dosing 8. For fever or aces or pains- take tylenol or ibuprofen appropriate for age and weight.  * for fevers greater than 101 orally you may alternate ibuprofen and tylenol every  3 hours.    - doxycycline (VIBRA-TABS) 100 MG tablet; Take 1 tablet (100 mg total)  by mouth 2 (two) times daily. 1 po bid  Dispense: 20 tablet; Refill: 0   [name], FNP

## 2018-06-11 NOTE — Patient Instructions (Signed)

## 2018-06-20 ENCOUNTER — Ambulatory Visit: Payer: Medicare Other | Admitting: Nurse Practitioner

## 2018-06-27 ENCOUNTER — Telehealth: Payer: Self-pay

## 2018-06-27 NOTE — Telephone Encounter (Signed)
Patient declined [place] services.    Patient was contacted due to a PHQ score of 21.  Staff Message was sent to the PCP regarding the patients elevated PHQ score

## 2018-07-22 ENCOUNTER — Emergency Department (HOSPITAL_COMMUNITY): Payer: Medicare Other

## 2018-07-22 ENCOUNTER — Encounter (HOSPITAL_COMMUNITY): Payer: Self-pay | Admitting: *Deleted

## 2018-07-22 ENCOUNTER — Emergency Department (HOSPITAL_COMMUNITY)
Admission: EM | Admit: 2018-07-22 | Discharge: 2018-07-22 | Disposition: A | Discharge status: Home / Self Care | Payer: Medicare Other | Attending: Emergency Medicine | Admitting: Emergency Medicine

## 2018-07-22 ENCOUNTER — Other Ambulatory Visit: Payer: Self-pay

## 2018-07-22 DIAGNOSIS — Z87891 Personal history of nicotine dependence: Secondary | ICD-10-CM | POA: Insufficient documentation

## 2018-07-22 DIAGNOSIS — M7918 Myalgia, other site: Secondary | ICD-10-CM

## 2018-07-22 DIAGNOSIS — Z79899 Other long term (current) drug therapy: Secondary | ICD-10-CM | POA: Insufficient documentation

## 2018-07-22 DIAGNOSIS — M791 Myalgia, unspecified site: Secondary | ICD-10-CM | POA: Insufficient documentation

## 2018-07-22 DIAGNOSIS — R109 Unspecified abdominal pain: Secondary | ICD-10-CM | POA: Diagnosis not present

## 2018-07-22 DIAGNOSIS — M542 Cervicalgia: Secondary | ICD-10-CM | POA: Insufficient documentation

## 2018-07-22 DIAGNOSIS — I10 Essential (primary) hypertension: Secondary | ICD-10-CM | POA: Insufficient documentation

## 2018-07-22 DIAGNOSIS — R4182 Altered mental status, unspecified: Secondary | ICD-10-CM | POA: Diagnosis present

## 2018-07-22 LAB — CBC WITH DIFFERENTIAL/PLATELET
Abs Immature Granulocytes: 0.09 10*3/uL — ABNORMAL HIGH (ref 0.00–0.07)
Basophils Absolute: 0 10*3/uL (ref 0.0–0.1)
Basophils Relative: 0 %
Eosinophils Absolute: 0.2 10*3/uL (ref 0.0–0.5)
Eosinophils Relative: 2 %
HCT: 39.7 % (ref 36.0–46.0)
Hemoglobin: 12.9 g/dL (ref 12.0–15.0)
Immature Granulocytes: 1 %
Lymphocytes Relative: 28 %
Lymphs Abs: 3.6 10*3/uL (ref 0.7–4.0)
MCH: 31.2 pg (ref 26.0–34.0)
MCHC: 32.5 g/dL (ref 30.0–36.0)
MCV: 95.9 fL (ref 80.0–100.0)
Monocytes Absolute: 0.7 10*3/uL (ref 0.1–1.0)
Monocytes Relative: 6 %
Neutro Abs: 8.2 10*3/uL — ABNORMAL HIGH (ref 1.7–7.7)
Neutrophils Relative %: 63 %
Platelets: 220 10*3/uL (ref 150–400)
RBC: 4.14 MIL/uL (ref 3.87–5.11)
RDW: 13.2 % (ref 11.5–15.5)
WBC: 12.9 10*3/uL — ABNORMAL HIGH (ref 4.0–10.5)
nRBC: 0 % (ref 0.0–0.2)

## 2018-07-22 LAB — COMPREHENSIVE METABOLIC PANEL
ALT: 13 U/L (ref 0–44)
AST: 19 U/L (ref 15–41)
Albumin: 3.9 g/dL (ref 3.5–5.0)
Alkaline Phosphatase: 108 U/L (ref 38–126)
Anion gap: 10 (ref 5–15)
BUN: 10 mg/dL (ref 6–20)
CO2: 28 mmol/L (ref 22–32)
Calcium: 9 mg/dL (ref 8.9–10.3)
Chloride: 103 mmol/L (ref 98–111)
Creatinine, Ser: 1 mg/dL (ref 0.44–1.00)
GFR calc Af Amer: >60 mL/min (ref >60)
GFR calc non Af Amer: >60 mL/min (ref >60)
Glucose, Bld: 90 mg/dL (ref 70–99)
Potassium: 4 mmol/L (ref 3.5–5.1)
Sodium: 141 mmol/L (ref 135–145)
Total Bilirubin: 0.5 mg/dL (ref 0.3–1.2)
Total Protein: 6.8 g/dL (ref 6.5–8.1)

## 2018-07-22 LAB — LIPASE, BLOOD: Lipase: 36 U/L (ref 11–51)

## 2018-07-22 MED ORDER — KETOROLAC TROMETHAMINE 30 MG/ML IJ SOLN
30.0000 mg | Freq: Once | INTRAMUSCULAR | Status: AC
  Administered 2018-07-22: 30 mg via INTRAVENOUS
  Filled 2018-07-22: qty 1

## 2018-07-22 MED ORDER — ACETAMINOPHEN 325 MG PO TABS
650.0000 mg | ORAL_TABLET | Freq: Once | ORAL | Status: DC
  Filled 2018-07-22: qty 2

## 2018-07-22 MED ORDER — IOHEXOL 300 MG/ML  SOLN
100.0000 mL | Freq: Once | INTRAMUSCULAR | Status: AC | PRN
  Administered 2018-07-22: 100 mL via INTRAVENOUS
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
## 2018-07-22 NOTE — ED Notes (Signed)
Patient transported to CT 
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
## 2018-07-22 NOTE — ED Notes (Signed)
Got patient on the monitor patient is resting with call bell in reach  ?

## 2018-07-22 NOTE — ED Triage Notes (Signed)
PT  Refused  c-collar .

## 2018-07-22 NOTE — ED Notes (Signed)
Patient transported to X-ray 

## 2018-07-22 NOTE — ED Notes (Signed)
Pt sts "the tramadol did not help me at all." provider aware. Pt transported to xray at the time
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
## 2018-07-22 NOTE — Discharge Instructions (Addendum)
Please read attached information. If you experience any new or worsening signs or symptoms please return to the emergency room for evaluation. Please follow-up with your primary care provider or specialist as discussed.  °

## 2018-07-22 NOTE — ED Notes (Signed)
Patient refuse the c-colar

## 2018-08-02 ENCOUNTER — Ambulatory Visit (INDEPENDENT_AMBULATORY_CARE_PROVIDER_SITE_OTHER): Payer: Medicare Other | Admitting: Family

## 2018-08-02 ENCOUNTER — Other Ambulatory Visit: Payer: Self-pay | Admitting: Nurse Practitioner

## 2018-08-02 ENCOUNTER — Other Ambulatory Visit: Payer: Self-pay

## 2018-08-02 ENCOUNTER — Encounter: Payer: Self-pay | Admitting: Family

## 2018-08-02 DIAGNOSIS — J069 Acute upper respiratory infection, unspecified: Secondary | ICD-10-CM | POA: Diagnosis not present

## 2018-08-02 DIAGNOSIS — R140 Abdominal distension (gaseous): Secondary | ICD-10-CM | POA: Diagnosis not present

## 2018-08-02 DIAGNOSIS — K2100 Gastro-esophageal reflux disease with esophagitis, without bleeding: Secondary | ICD-10-CM

## 2018-08-02 DIAGNOSIS — K130 Diseases of lips: Secondary | ICD-10-CM

## 2018-08-02 MED ORDER — SIMETHICONE 80 MG PO CHEW: 80.0000 mg | CHEWABLE_TABLET | Freq: Four times a day (QID) | ORAL | 0 refills | Status: DC | PRN

## 2018-08-02 MED ORDER — OMEPRAZOLE 20 MG PO CPDR: 20.0000 mg | DELAYED_RELEASE_CAPSULE | Freq: Every day | ORAL | 3 refills | Status: DC

## 2018-08-02 MED ORDER — FLUCONAZOLE 150 MG PO TABS: 150.0000 mg | ORAL_TABLET | ORAL | 0 refills | Status: DC | PRN

## 2018-08-02 MED ORDER — BENZONATATE 200 MG PO CAPS: 200.0000 mg | ORAL_CAPSULE | Freq: Three times a day (TID) | ORAL | 1 refills | Status: DC | PRN

## 2018-08-02 NOTE — Progress Notes (Signed)
Virtual Visit via telephone Note  I connected with [name] on 08/02/18 at 5:10pm at telephone and verified that I am speaking with the correct person using two identifiers. [name] is currently located at home  and no one  is currently with her during visit. The provider, Evelina Dun, FNP is located in their office at time of visit.  I discussed the limitations, risks, security and privacy concerns of performing an evaluation and management service by telephone and the availability of in person appointments. I also discussed with the patient that there may be a patient responsible charge related to this service. The patient expressed understanding and agreed to proceed.   History and Present Illness:  PT calls the office today with abdominal bloating and gas pain.  She also complaining of bilateral rash on the corners of the mouth that started two weeks ago. She states she has tried OTC fungal cream with no relief.   She had a negative CT abdomen on 07/22/18. Abdominal Pain  This is a new problem. The current episode started 1 to 4 weeks ago. The onset quality is gradual. The problem occurs intermittently. Associated symptoms include belching, flatus and headaches. Associated symptoms comments: Bloating . The pain is relieved by passing flatus. Treatments tried: gas x.  Sinusitis  This is a new problem. The current episode started in the past 7 days. The problem has been waxing and waning since onset. The pain is moderate. Associated symptoms include congestion, coughing, ear pain, headaches, sneezing and a sore throat. Past treatments include lying down and oral decongestants. The treatment provided mild relief.      Review of Systems  HENT: Positive for congestion, ear pain, sneezing and sore throat.   Respiratory: Positive for cough.   Gastrointestinal: Positive for abdominal pain and flatus.  Neurological: Positive for headaches.      Observations/Objective: No SOB or distress noted   Assessment and Plan: [name] comes in today with chief complaint of No chief complaint on file.   Diagnosis and orders addressed:  1. Abdominal bloating - omeprazole (PRILOSEC) 20 MG capsule; Take 1 capsule (20 mg total) by mouth daily.  Dispense: 30 capsule; Refill: 3 - simethicone (MYLICON) 80 MG chewable tablet; Chew 1 tablet (80 mg total) by mouth every 6 (six) hours as needed for flatulence.  Dispense: 30 tablet; Refill: 0  2. Gastroesophageal reflux disease with esophagitis -Diet discussed- Avoid fried, spicy, citrus foods, caffeine and alcohol -Do not eat 2-3 hours before bedtime -Encouraged small frequent meals -Avoid NSAID's - omeprazole (PRILOSEC) 20 MG capsule; Take 1 capsule (20 mg total) by mouth daily.  Dispense: 30 capsule; Refill: 3 - simethicone (MYLICON) 80 MG chewable tablet; Chew 1 tablet (80 mg total) by mouth every 6 (six) hours as needed for flatulence.  Dispense: 30 tablet; Refill: 0  3. Angular cheilitis Keep clean and dry - fluconazole (DIFLUCAN) 150 MG tablet; Take 1 tablet (150 mg total) by mouth every three (3) days as needed.  Dispense: 3 tablet; Refill: 0  4. Viral URI - Take meds as prescribed - Use a cool mist humidifier  -Use saline nose sprays frequently -Force fluids -For any cough or congestion  Use plain Mucinex- regular strength or max strength is fine -For fever or aces or pains- take tylenol or ibuprofen. -Throat lozenges if help - benzonatate (TESSALON) 200 MG capsule; Take 1 capsule (200 mg total) by mouth 3 (three) times daily as needed.  Dispense: 30 capsule;  Refill: 1   Labs pending Health Maintenance reviewed Diet and exercise encouraged  Follow up plan: Keep follow up with PCP     I discussed the assessment and treatment plan with the patient. The patient was provided an opportunity to ask questions and all were answered. The patient agreed with the plan and  demonstrated an understanding of the instructions.   The patient was advised to call back or seek an in-person evaluation if the symptoms worsen or if the condition fails to improve as anticipated.  The above assessment and management plan was discussed with the patient. The patient verbalized understanding of and has agreed to the management plan. Patient is aware to call the clinic if symptoms persist or worsen. Patient is aware when to return to the clinic for a follow-up visit. Patient educated on when it is appropriate to go to the emergency department.   Time call ended:  5:25pm   I provided 15 minutes of non-face-to-face time during this encounter.    Evelina Dun, FNP

## 2018-08-02 NOTE — Telephone Encounter (Signed)
What symptoms do you have? Has yeast on the corner of her mouth and wants #2 diflucan. Patient needs something for gas and nausea. OTC medicine not working.  How long have you been sick? 3-4 days  Have you been seen for this problem? yes  If your provider decides to give you a prescription, which pharmacy would you like for it to be sent to? [facility].   Patient informed that this information will be sent to the clinical staff for review and that they should receive a follow up call.

## 2018-08-05 MED ORDER — FLUCONAZOLE 150 MG PO TABS: ORAL_TABLET | ORAL | 0 refills | Status: DC

## 2018-08-05 NOTE — Telephone Encounter (Signed)
Diflucan sent to pharmacy Only thing for gas an nausea is gasx or beano or pepto bismol

## 2018-08-19 ENCOUNTER — Encounter (HOSPITAL_BASED_OUTPATIENT_CLINIC_OR_DEPARTMENT_OTHER): Payer: Self-pay | Admitting: Emergency Medicine

## 2018-08-19 ENCOUNTER — Inpatient Hospital Stay (HOSPITAL_BASED_OUTPATIENT_CLINIC_OR_DEPARTMENT_OTHER)
Admission: EM | Admit: 2018-08-19 | Discharge: 2018-08-21 | DRG: 917 | Disposition: A | Discharge status: Home / Self Care | Payer: Medicare Other | Attending: Family Medicine | Admitting: Family Medicine

## 2018-08-19 ENCOUNTER — Other Ambulatory Visit: Payer: Self-pay

## 2018-08-19 ENCOUNTER — Emergency Department (HOSPITAL_BASED_OUTPATIENT_CLINIC_OR_DEPARTMENT_OTHER): Payer: Medicare Other

## 2018-08-19 DIAGNOSIS — G8929 Other chronic pain: Secondary | ICD-10-CM | POA: Diagnosis present

## 2018-08-19 DIAGNOSIS — Z20828 Contact with and (suspected) exposure to other viral communicable diseases: Secondary | ICD-10-CM | POA: Diagnosis present

## 2018-08-19 DIAGNOSIS — G894 Chronic pain syndrome: Secondary | ICD-10-CM | POA: Diagnosis present

## 2018-08-19 DIAGNOSIS — G4734 Idiopathic sleep related nonobstructive alveolar hypoventilation: Secondary | ICD-10-CM

## 2018-08-19 DIAGNOSIS — J9601 Acute respiratory failure with hypoxia: Secondary | ICD-10-CM | POA: Diagnosis present

## 2018-08-19 DIAGNOSIS — F419 Anxiety disorder, unspecified: Secondary | ICD-10-CM | POA: Diagnosis present

## 2018-08-19 DIAGNOSIS — E860 Dehydration: Secondary | ICD-10-CM

## 2018-08-19 DIAGNOSIS — E876 Hypokalemia: Secondary | ICD-10-CM | POA: Diagnosis present

## 2018-08-19 DIAGNOSIS — E872 Acidosis: Secondary | ICD-10-CM | POA: Diagnosis present

## 2018-08-19 DIAGNOSIS — R0902 Hypoxemia: Secondary | ICD-10-CM | POA: Diagnosis not present

## 2018-08-19 DIAGNOSIS — D649 Anemia, unspecified: Secondary | ICD-10-CM

## 2018-08-19 DIAGNOSIS — I10 Essential (primary) hypertension: Secondary | ICD-10-CM | POA: Diagnosis not present

## 2018-08-19 DIAGNOSIS — Z79899 Other long term (current) drug therapy: Secondary | ICD-10-CM

## 2018-08-19 DIAGNOSIS — Z90710 Acquired absence of both cervix and uterus: Secondary | ICD-10-CM | POA: Diagnosis not present

## 2018-08-19 DIAGNOSIS — D638 Anemia in other chronic diseases classified elsewhere: Secondary | ICD-10-CM | POA: Diagnosis present

## 2018-08-19 DIAGNOSIS — N179 Acute kidney failure, unspecified: Secondary | ICD-10-CM | POA: Diagnosis not present

## 2018-08-19 DIAGNOSIS — Z720 Tobacco use: Secondary | ICD-10-CM

## 2018-08-19 DIAGNOSIS — E861 Hypovolemia: Secondary | ICD-10-CM | POA: Diagnosis not present

## 2018-08-19 DIAGNOSIS — F315 Bipolar disorder, current episode depressed, severe, with psychotic features: Secondary | ICD-10-CM

## 2018-08-19 DIAGNOSIS — J189 Pneumonia, unspecified organism: Secondary | ICD-10-CM | POA: Diagnosis present

## 2018-08-19 DIAGNOSIS — F319 Bipolar disorder, unspecified: Secondary | ICD-10-CM | POA: Diagnosis not present

## 2018-08-19 DIAGNOSIS — T43591A Poisoning by other antipsychotics and neuroleptics, accidental (unintentional), initial encounter: Principal | ICD-10-CM | POA: Diagnosis present

## 2018-08-19 DIAGNOSIS — Z87891 Personal history of nicotine dependence: Secondary | ICD-10-CM | POA: Diagnosis not present

## 2018-08-19 DIAGNOSIS — T40601A Poisoning by unspecified narcotics, accidental (unintentional), initial encounter: Secondary | ICD-10-CM | POA: Diagnosis not present

## 2018-08-19 DIAGNOSIS — J9602 Acute respiratory failure with hypercapnia: Secondary | ICD-10-CM | POA: Diagnosis not present

## 2018-08-19 DIAGNOSIS — T424X1A Poisoning by benzodiazepines, accidental (unintentional), initial encounter: Secondary | ICD-10-CM | POA: Diagnosis not present

## 2018-08-19 DIAGNOSIS — I959 Hypotension, unspecified: Secondary | ICD-10-CM | POA: Diagnosis present

## 2018-08-19 HISTORY — DX: Hypokalemia: E87.6

## 2018-08-19 HISTORY — DX: Anemia, unspecified: D64.9

## 2018-08-19 LAB — CBC WITH DIFFERENTIAL/PLATELET
Abs Immature Granulocytes: 0.04 10*3/uL (ref 0.00–0.07)
Basophils Absolute: 0 10*3/uL (ref 0.0–0.1)
Basophils Relative: 0 %
Eosinophils Absolute: 0.1 10*3/uL (ref 0.0–0.5)
Eosinophils Relative: 2 %
HCT: 43 % (ref 36.0–46.0)
Hemoglobin: 13.4 g/dL (ref 12.0–15.0)
Immature Granulocytes: 1 %
Lymphocytes Relative: 42 %
Lymphs Abs: 3.5 10*3/uL (ref 0.7–4.0)
MCH: 30.7 pg (ref 26.0–34.0)
MCHC: 31.2 g/dL (ref 30.0–36.0)
MCV: 98.4 fL (ref 80.0–100.0)
Monocytes Absolute: 0.8 10*3/uL (ref 0.1–1.0)
Monocytes Relative: 9 %
Neutro Abs: 3.9 10*3/uL (ref 1.7–7.7)
Neutrophils Relative %: 46 %
Platelets: 213 10*3/uL (ref 150–400)
RBC: 4.37 MIL/uL (ref 3.87–5.11)
RDW: 13.4 % (ref 11.5–15.5)
WBC: 8.4 10*3/uL (ref 4.0–10.5)
nRBC: 0 % (ref 0.0–0.2)

## 2018-08-19 LAB — MAGNESIUM: Magnesium: 1.7 mg/dL (ref 1.7–2.4)

## 2018-08-19 LAB — POCT I-STAT EG7
Bicarbonate: 29 mmol/L — ABNORMAL HIGH (ref 20.0–28.0)
Calcium, Ion: 1.19 mmol/L (ref 1.15–1.40)
HCT: 30 % — ABNORMAL LOW (ref 36.0–46.0)
Hemoglobin: 10.2 g/dL — ABNORMAL LOW (ref 12.0–15.0)
O2 Saturation: 99 %
Patient temperature: 98.3
Potassium: 3.3 mmol/L — ABNORMAL LOW (ref 3.5–5.1)
Sodium: 136 mmol/L (ref 135–145)
TCO2: 31 mmol/L (ref 22–32)
pCO2, Ven: 67.7 mmHg — ABNORMAL HIGH (ref 44.0–60.0)
pH, Ven: 7.238 — ABNORMAL LOW (ref 7.250–7.430)
pO2, Ven: 140 mmHg — ABNORMAL HIGH (ref 32.0–45.0)

## 2018-08-19 LAB — URINALYSIS, ROUTINE W REFLEX MICROSCOPIC
Bilirubin Urine: NEGATIVE
Glucose, UA: NEGATIVE mg/dL
Hgb urine dipstick: NEGATIVE
Ketones, ur: NEGATIVE mg/dL
Leukocytes,Ua: NEGATIVE
Nitrite: NEGATIVE
Protein, ur: NEGATIVE mg/dL
Specific Gravity, Urine: 1.02 (ref 1.005–1.030)
pH: 6 (ref 5.0–8.0)

## 2018-08-19 LAB — COMPREHENSIVE METABOLIC PANEL
ALT: 13 U/L (ref 0–44)
AST: 23 U/L (ref 15–41)
Albumin: 4 g/dL (ref 3.5–5.0)
Alkaline Phosphatase: 111 U/L (ref 38–126)
Anion gap: 9 (ref 5–15)
BUN: 19 mg/dL (ref 6–20)
CO2: 29 mmol/L (ref 22–32)
Calcium: 9.1 mg/dL (ref 8.9–10.3)
Chloride: 97 mmol/L — ABNORMAL LOW (ref 98–111)
Creatinine, Ser: 2.09 mg/dL — ABNORMAL HIGH (ref 0.44–1.00)
GFR calc Af Amer: 33 mL/min — ABNORMAL LOW (ref >60)
GFR calc non Af Amer: 28 mL/min — ABNORMAL LOW (ref >60)
Glucose, Bld: 79 mg/dL (ref 70–99)
Potassium: 4 mmol/L (ref 3.5–5.1)
Sodium: 135 mmol/L (ref 135–145)
Total Bilirubin: 0.2 mg/dL — ABNORMAL LOW (ref 0.3–1.2)
Total Protein: 7.4 g/dL (ref 6.5–8.1)

## 2018-08-19 LAB — CBG MONITORING, ED
Glucose-Capillary: 59 mg/dL — ABNORMAL LOW (ref 70–99)
Glucose-Capillary: 87 mg/dL (ref 70–99)

## 2018-08-19 LAB — SODIUM, URINE, RANDOM: Sodium, Ur: 78 mmol/L

## 2018-08-19 LAB — RAPID URINE DRUG SCREEN, HOSP PERFORMED
Amphetamines: NOT DETECTED
Barbiturates: NOT DETECTED
Benzodiazepines: POSITIVE — AB
Cocaine: NOT DETECTED
Opiates: POSITIVE — AB
Tetrahydrocannabinol: NOT DETECTED

## 2018-08-19 LAB — LACTIC ACID, PLASMA: Lactic Acid, Venous: 1.1 mmol/L (ref 0.5–1.9)

## 2018-08-19 LAB — PHOSPHORUS: Phosphorus: 3.1 mg/dL (ref 2.5–4.6)

## 2018-08-19 LAB — TROPONIN I: Troponin I: <0.03 ng/mL (ref <0.03)

## 2018-08-19 LAB — TSH: TSH: 6.11 u[IU]/mL — ABNORMAL HIGH (ref 0.350–4.500)

## 2018-08-19 LAB — CREATININE, URINE, RANDOM: Creatinine, Urine: 53.3 mg/dL

## 2018-08-19 LAB — MRSA PCR SCREENING: MRSA by PCR: NEGATIVE

## 2018-08-19 LAB — T4, FREE: Free T4: 0.4 ng/dL — ABNORMAL LOW (ref 0.82–1.77)

## 2018-08-19 LAB — SARS CORONAVIRUS 2 AG (30 MIN TAT): SARS Coronavirus 2 Ag: NEGATIVE

## 2018-08-19 MED ORDER — QUETIAPINE FUMARATE 100 MG PO TABS
100.0000 mg | ORAL_TABLET | Freq: Every day | ORAL | Status: DC
  Administered 2018-08-19 – 2018-08-20 (×2): 100 mg via ORAL
  Filled 2018-08-19 (×2): qty 1

## 2018-08-19 MED ORDER — SODIUM CHLORIDE 0.9 % IV BOLUS
1000.0000 mL | Freq: Once | INTRAVENOUS | Status: AC
  Administered 2018-08-19: 1000 mL via INTRAVENOUS

## 2018-08-19 MED ORDER — SIMETHICONE 80 MG PO CHEW: 80.0000 mg | CHEWABLE_TABLET | Freq: Four times a day (QID) | ORAL | Status: DC | PRN

## 2018-08-19 MED ORDER — POLYETHYLENE GLYCOL 3350 17 G PO PACK: 17.0000 g | PACK | Freq: Every day | ORAL | Status: DC | PRN

## 2018-08-19 MED ORDER — ONDANSETRON HCL 4 MG PO TABS: 4.0000 mg | ORAL_TABLET | Freq: Four times a day (QID) | ORAL | Status: DC | PRN

## 2018-08-19 MED ORDER — SODIUM CHLORIDE 0.9 % IV BOLUS
1000.0000 mL | Freq: Once | INTRAVENOUS | Status: AC
  Administered 2018-08-19: 10:00:00 1000 mL via INTRAVENOUS

## 2018-08-19 MED ORDER — IPRATROPIUM-ALBUTEROL 0.5-2.5 (3) MG/3ML IN SOLN: 3.0000 mL | RESPIRATORY_TRACT | Status: DC | PRN

## 2018-08-19 MED ORDER — BENZONATATE 100 MG PO CAPS: 200.0000 mg | ORAL_CAPSULE | Freq: Three times a day (TID) | ORAL | Status: DC | PRN

## 2018-08-19 MED ORDER — FLUTICASONE PROPIONATE 50 MCG/ACT NA SUSP
2.0000 | Freq: Every day | NASAL | Status: DC
  Filled 2018-08-19: qty 16

## 2018-08-19 MED ORDER — PAROXETINE HCL 20 MG PO TABS
40.0000 mg | ORAL_TABLET | ORAL | Status: DC
  Administered 2018-08-20 – 2018-08-21 (×2): 40 mg via ORAL
  Filled 2018-08-19 (×2): qty 2

## 2018-08-19 MED ORDER — ALBUTEROL SULFATE HFA 108 (90 BASE) MCG/ACT IN AERS
3.0000 | INHALATION_SPRAY | RESPIRATORY_TRACT | Status: DC | PRN
  Administered 2018-08-19: 3 via RESPIRATORY_TRACT
  Filled 2018-08-19: qty 6.7

## 2018-08-19 MED ORDER — SODIUM CHLORIDE 0.9% FLUSH
3.0000 mL | Freq: Two times a day (BID) | INTRAVENOUS | Status: DC
  Administered 2018-08-19 – 2018-08-21 (×4): 3 mL via INTRAVENOUS

## 2018-08-19 MED ORDER — ACETAMINOPHEN 650 MG RE SUPP: 650.0000 mg | Freq: Four times a day (QID) | RECTAL | Status: DC | PRN

## 2018-08-19 MED ORDER — ACETAMINOPHEN 325 MG PO TABS: 650.0000 mg | ORAL_TABLET | Freq: Four times a day (QID) | ORAL | Status: DC | PRN

## 2018-08-19 MED ORDER — ORAL CARE MOUTH RINSE
15.0000 mL | Freq: Two times a day (BID) | OROMUCOSAL | Status: DC
  Administered 2018-08-19 – 2018-08-21 (×4): 15 mL via OROMUCOSAL

## 2018-08-19 MED ORDER — OXYCODONE HCL 5 MG PO TABS
5.0000 mg | ORAL_TABLET | Freq: Four times a day (QID) | ORAL | Status: DC | PRN
  Administered 2018-08-19 – 2018-08-21 (×4): 5 mg via ORAL
  Filled 2018-08-19 (×4): qty 1

## 2018-08-19 MED ORDER — FLEET ENEMA 7-19 GM/118ML RE ENEM: 1.0000 | ENEMA | Freq: Once | RECTAL | Status: DC | PRN

## 2018-08-19 MED ORDER — DEXTROSE 50 % IV SOLN
1.0000 | Freq: Once | INTRAVENOUS | Status: AC
  Administered 2018-08-19: 50 mL via INTRAVENOUS
  Filled 2018-08-19: qty 50

## 2018-08-19 MED ORDER — NALOXONE HCL 0.4 MG/ML IJ SOLN
0.4000 mg | Freq: Once | INTRAMUSCULAR | Status: AC
  Administered 2018-08-19: 0.4 mg via INTRAVENOUS
  Filled 2018-08-19: qty 1

## 2018-08-19 MED ORDER — HYDROXYZINE HCL 25 MG PO TABS: 25.0000 mg | ORAL_TABLET | Freq: Three times a day (TID) | ORAL | Status: DC | PRN

## 2018-08-19 MED ORDER — NYSTATIN 100000 UNIT/ML MT SUSP: 5.0000 mL | Freq: Four times a day (QID) | OROMUCOSAL | Status: DC | PRN

## 2018-08-19 MED ORDER — LORATADINE 10 MG PO TABS
10.0000 mg | ORAL_TABLET | Freq: Every day | ORAL | Status: DC
  Administered 2018-08-20 – 2018-08-21 (×2): 10 mg via ORAL
  Filled 2018-08-19 (×2): qty 1

## 2018-08-19 MED ORDER — CYCLOBENZAPRINE HCL 5 MG PO TABS: 5.0000 mg | ORAL_TABLET | Freq: Two times a day (BID) | ORAL | Status: DC | PRN

## 2018-08-19 MED ORDER — NALOXONE HCL 0.4 MG/ML IJ SOLN
0.1000 mg | Freq: Once | INTRAMUSCULAR | Status: AC
  Administered 2018-08-19: 0.1 mg via INTRAVENOUS
  Filled 2018-08-19: qty 1

## 2018-08-19 MED ORDER — POLYETHYLENE GLYCOL 3350 17 G PO PACK: 17.0000 g | PACK | Freq: Every day | ORAL | Status: DC

## 2018-08-19 MED ORDER — SENNA 8.6 MG PO TABS
1.0000 | ORAL_TABLET | Freq: Two times a day (BID) | ORAL | Status: DC
  Administered 2018-08-19: 8.6 mg via ORAL
  Filled 2018-08-19 (×3): qty 1

## 2018-08-19 MED ORDER — HEPARIN SODIUM (PORCINE) 5000 UNIT/ML IJ SOLN
5000.0000 [IU] | Freq: Three times a day (TID) | INTRAMUSCULAR | Status: DC
  Administered 2018-08-19 – 2018-08-20 (×3): 5000 [IU] via SUBCUTANEOUS
  Filled 2018-08-19 (×3): qty 1

## 2018-08-19 MED ORDER — SORBITOL 70 % SOLN: 30.0000 mL | Freq: Every day | Status: DC | PRN

## 2018-08-19 MED ORDER — PANTOPRAZOLE SODIUM 40 MG PO TBEC
40.0000 mg | DELAYED_RELEASE_TABLET | Freq: Every day | ORAL | Status: DC
  Administered 2018-08-20 – 2018-08-21 (×2): 40 mg via ORAL
  Filled 2018-08-19 (×2): qty 1

## 2018-08-19 MED ORDER — SODIUM CHLORIDE 0.9 % IV SOLN
INTRAVENOUS | Status: DC
  Administered 2018-08-19 – 2018-08-20 (×3): via INTRAVENOUS

## 2018-08-19 MED ORDER — ONDANSETRON HCL 4 MG/2ML IJ SOLN: 4.0000 mg | Freq: Four times a day (QID) | INTRAMUSCULAR | Status: DC | PRN

## 2018-08-19 NOTE — ED Notes (Signed)
Left Radial Artery x 1 Attempt for ABG. Pt on 100% NRB at this time. No complications noted. Pt tolerated well. Bleeding stopped.

## 2018-08-19 NOTE — ED Notes (Signed)
Received call from [place] on 4th floor at [facility]. This pt is now going to be going to [facility] but they do not have a bed assignment yet.  She asked that I call when [facility] gets there to see if she has a bed assignment.  Number is [phone]
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
## 2018-08-19 NOTE — Progress Notes (Signed)
43 year old female with history of chronic pain syndrome presents to the [facility] with complaints of generalized fatigue.  She was hypotensive and hypoxic on presentation.  She was taking benzodiazepines and psychiatric medications at home.  Weakness was suspected secondary to combination of benzodiazepines and psychiatric meds.  She was also found to have acute kidney injury with a creatinine of 2.  Her baseline creatinine is normal.  Chest x-ray did not show any pneumonia.  Patient being admitted for management of acute kidney injury, hypotension and hypoxia.  Hypoxia most likely secondary to hypoventilation.  Patient can be admitted under observation.

## 2018-08-19 NOTE — ED Notes (Signed)
Bed assignment changed to stepdown , reports has been called to [place], Therapist, sports the receiving RN at [facility] at bedside and ready to transport the patient.

## 2018-08-19 NOTE — ED Notes (Signed)
Called patient's sister Lattie Haw and informed her about the transfer per patient's Permission.

## 2018-08-19 NOTE — H&P (Addendum)
History and Physical    [name]:[id number] DOB: 1975/10/17 DOA: 08/19/2018  PCP: Chevis Pretty, FNP  Patient coming from: Home via [facility] ED  I have personally briefly reviewed patient's old medical records in [place]  Chief Complaint: My legs gave out felt like spaghetti.  HPI: [name] is a 43 y.o. female with medical history significant of chronic pain syndrome on chronic pain medications, anxiety, bipolar disorder who presents to the ED secondary to weakness in the lower extremities and weakness all over.  Patient states she went to watch her grandchildren however just as she was getting there her legs felt weak and went limp under her like they felt like spaghetti.  Patient with complaints of generalized weakness and stated that she almost fell.  Patient denies any fevers, no chills, no chest pain, no shortness of breath, no nausea, no vomiting, no cough, no syncope, no diarrhea, no constipation, no dysuria, no melena, no hematemesis, no hematochezia, no syncopal episodes, no focal neurological deficits.  Patient endorses good oral intake.  Patient endorses good urine output.  Patient states has been congested over the past 3 to 4 days prior to admission.  Patient stated her pain medication recently changed from hydrocodone to oxycodone approximately a week ago.  ED Course: Patient seen in the ED noted to be hypotensive and hypoxic.  Patient noted to have a blood pressure of 69/47 on presentation to the ED with sats of 75% on room air.  Chest x-ray done was negative for any acute infiltrates however did show mild left basilar atelectasis.  EKG done with no ischemic changes noted.  VBG done with a pH of 7.24, PCO2 of 68, PO2 of 140, bicarb of 29.  Comprehensive metabolic profile with a creatinine of 2.09, bilirubin of 0.2 otherwise was within normal limits.  First set of troponin was negative.  Lactic acid level was 1.1.  CBC done with a  hemoglobin of 13.4.  Repeat H&H done with a hemoglobin down to 10.2.  TSH noted to be at 6.110.  Blood glucose level obtained was 79.  SARS COVID2  Via Abbott ID was negative.  Allises was nitrite negative leukocytes negative with a specific gravity of 1.020.  Blood cultures were ordered.  MRSA PCR was pending.  Patient given an amp of D50, IV Narcan x2 in the ED as patient noted to have pinpoint pupils.  Patient given IV fluids with improvement with blood pressure.  Patient also initially was on a nonrebreather and subsequently hypoxia improved and patient now on 2 L nasal cannula.  Hospitalist were called to admit the patient for further evaluation and management.  Review of Systems: As per HPI otherwise 10 point review of systems negative.   Past Medical History:  Diagnosis Date  . Anxiety   . Back pain   . IBS (irritable bowel syndrome)     Past Surgical History:  Procedure Laterality Date  . ABDOMINAL HYSTERECTOMY       reports that she quit smoking about 4 years ago. She has never used smokeless tobacco. She reports that she does not drink alcohol or use drugs.  Patient states smokes about a pack per day of cigarettes which she started back up 2 months ago after quitting.  Allergies  Allergen Reactions  . Amoxicillin Nausea And Vomiting  . Chantix [Varenicline] Other (See Comments)    Causes bad nightmares, hallucinations  . Codeine Other (See Comments)  . Divalproex Sodium Other (  See Comments)    sedation  . Lithium Other (See Comments)    Drowsiness, tremors drowsiness  . Propoxyphene Nausea And Vomiting  . Augmentin [Amoxicillin-Pot Clavulanate] Diarrhea and Other (See Comments)  . Zolpidem Other (See Comments)    Patient states it does not agree with her body.     Family History  Problem Relation Age of Onset  . Renal Disease Mother   . Heart attack Father    Mother deceased in his 78s from kidney failure.  Father deceased from an acute MI patient cannot remember  age at time of death.  Prior to Admission medications   Medication Sig Start Date End Date Taking? Authorizing Provider  ALPRAZolam Duanne Moron) 1 MG tablet Take 1 tablet (1 mg total) by mouth 2 (two) times daily as needed for anxiety. 03/21/18  Yes Martin, Mary-Margaret, FNP  celecoxib (CELEBREX) 200 MG capsule Take 200 mg by mouth daily as needed for pain. 03/11/18  Yes [provider]  cetirizine (ZYRTEC) 10 MG tablet Take 1 tablet (10 mg total) by mouth daily. 04/08/18  Yes Rakes, Connye Burkitt, FNP  cyclobenzaprine (FLEXERIL) 10 MG tablet Take 10 mg by mouth 3 (three) times daily. 05/30/18  Yes [provider]  fluconazole (DIFLUCAN) 150 MG tablet 1 po q week x 4 weeks 08/05/18  Yes Martin, Mary-Margaret, FNP  lisinopril (PRINIVIL,ZESTRIL) 10 MG tablet Take 1 tablet (10 mg total) by mouth daily. 04/25/18  Yes Martin, Mary-Margaret, FNP  nystatin (MYCOSTATIN) 100000 UNIT/ML suspension Take 5 mLs (500,000 Units total) by mouth 4 (four) times daily as needed. 03/21/18  Yes Martin, Mary-Margaret, FNP  omeprazole (PRILOSEC) 20 MG capsule Take 1 capsule (20 mg total) by mouth daily. 08/02/18  Yes Hawks, Christy A, FNP  ondansetron (ZOFRAN) 4 MG tablet Take 1 tablet (4 mg total) by mouth every 8 (eight) hours as needed for nausea or vomiting. 04/09/18  Yes Rakes, Connye Burkitt, FNP  oxyCODONE-acetaminophen (PERCOCET) 10-325 MG tablet Take 1 tablet by mouth every 4 (four) hours as needed for pain.   Yes [provider]  PARoxetine (PAXIL) 40 MG tablet Take 1 tablet (40 mg total) by mouth every morning. 04/25/18  Yes Martin, Mary-Margaret, FNP  polyethylene glycol (MIRALAX / GLYCOLAX) packet Take 17 g by mouth daily. 02/07/16  Yes Long, Wonda Olds, MD  QUEtiapine (SEROQUEL) 400 MG tablet Take 1 tablet (400 mg total) by mouth 2 (two) times daily. 06/11/18  Yes Hassell Done, Mary-Margaret, FNP  simethicone (MYLICON) 80 MG chewable tablet Chew 1 tablet (80 mg total) by mouth every 6 (six) hours as needed for  flatulence. 08/02/18  Yes Hawks, Christy A, FNP  benzonatate (TESSALON) 200 MG capsule Take 1 capsule (200 mg total) by mouth 3 (three) times daily as needed. Patient not taking: Reported on 08/19/2018 08/02/18   Sharion Balloon, FNP    Physical Exam: Vitals:   08/19/18 1415 08/19/18 1430 08/19/18 1552 08/19/18 1557  BP: 103/82 94/65  128/78  Pulse:  81  90  Resp: (!) 9 15  17   Temp:      TempSrc:      SpO2:  97%  97%  Weight:   59.3 kg   Height:   5\' 1"  (1.549 m)     Constitutional: NAD, calm, comfortable Vitals:   08/19/18 1415 08/19/18 1430 08/19/18 1552 08/19/18 1557  BP: 103/82 94/65  128/78  Pulse:  81  90  Resp: (!) 9 15  17   Temp:      TempSrc:  SpO2:  97%  97%  Weight:   59.3 kg   Height:   5\' 1"  (1.549 m)    Eyes: PERRL, lids and conjunctivae normal ENMT: Mucous membranes are dry. Posterior pharynx clear of any exudate or lesions.Normal dentition.  Neck: normal, supple, no masses, no thyromegaly Respiratory: clear to auscultation bilaterally, no wheezing, no crackles. Normal respiratory effort. No accessory muscle use.  Cardiovascular: Regular rate and rhythm, no murmurs / rubs / gallops. No extremity edema. 2+ pedal pulses. No carotid bruits.  Abdomen: no tenderness, no masses palpated. No hepatosplenomegaly. Bowel sounds positive.  Musculoskeletal: no clubbing / cyanosis. No joint deformity upper and lower extremities. Good ROM, no contractures. Normal muscle tone.  Skin: no rashes, lesions, ulcers. No induration Neurologic: CN 2-12 grossly intact. Sensation intact, DTR normal. Strength 5/5 in all 4.  Psychiatric: Normal judgment and insight. Alert and oriented x 3. Normal mood.   Labs on Admission: I have personally reviewed following labs and imaging studies  CBC: Recent Labs  Lab 08/19/18 0930 08/19/18 1019  WBC 8.4  --   NEUTROABS 3.9  --   HGB 13.4 10.2*  HCT 43.0 30.0*  MCV 98.4  --   PLT 213  --    Basic Metabolic Panel: Recent Labs  Lab  08/19/18 0930 08/19/18 1019  NA 135 136  K 4.0 3.3*  CL 97*  --   CO2 29  --   GLUCOSE 79  --   BUN 19  --   CREATININE 2.09*  --   CALCIUM 9.1  --    GFR: Estimated Creatinine Clearance: 28.7 mL/min (A) (by C-G formula based on SCr of 2.09 mg/dL (H)). Liver Function Tests: Recent Labs  Lab 08/19/18 0930  AST 23  ALT 13  ALKPHOS 111  BILITOT 0.2*  PROT 7.4  ALBUMIN 4.0   No results for input(s): LIPASE, AMYLASE in the last 168 hours. No results for input(s): AMMONIA in the last 168 hours. Coagulation Profile: No results for input(s): INR, PROTIME in the last 168 hours. Cardiac Enzymes: Recent Labs  Lab 08/19/18 0930  TROPONINI <0.03   BNP (last 3 results) No results for input(s): PROBNP in the last 8760 hours. HbA1C: No results for input(s): HGBA1C in the last 72 hours. CBG: Recent Labs  Lab 08/19/18 0922 08/19/18 1058  GLUCAP 59* 87   Lipid Profile: No results for input(s): CHOL, HDL, LDLCALC, TRIG, CHOLHDL, LDLDIRECT in the last 72 hours. Thyroid Function Tests: Recent Labs    08/19/18 0930  TSH 6.110*   Anemia Panel: No results for input(s): VITAMINB12, FOLATE, FERRITIN, TIBC, IRON, RETICCTPCT in the last 72 hours. Urine analysis:    Component Value Date/Time   COLORURINE YELLOW 08/19/2018 0956   APPEARANCEUR CLEAR 08/19/2018 0956   APPEARANCEUR Clear 05/06/2018 1407   LABSPEC 1.020 08/19/2018 0956   PHURINE 6.0 08/19/2018 0956   GLUCOSEU NEGATIVE 08/19/2018 0956   HGBUR NEGATIVE 08/19/2018 0956   BILIRUBINUR NEGATIVE 08/19/2018 0956   BILIRUBINUR Negative 05/06/2018 1407   KETONESUR NEGATIVE 08/19/2018 0956   PROTEINUR NEGATIVE 08/19/2018 0956   UROBILINOGEN 0.2 01/13/2015 1359   NITRITE NEGATIVE 08/19/2018 0956   LEUKOCYTESUR NEGATIVE 08/19/2018 0956    Radiological Exams on Admission: Dg Chest Portable 1 View  Result Date: 08/19/2018 CLINICAL DATA:  Fatigue EXAM: PORTABLE CHEST 1 VIEW COMPARISON:  07/22/2018 FINDINGS: Cardiac shadows  within normal limits. Mild left basilar atelectasis is seen. No focal infiltrate or effusion is noted. No bony abnormality is seen. IMPRESSION: Mild  left basilar atelectasis. Electronically Signed   By: Inez Catalina M.D.   On: 08/19/2018 09:53    EKG: Independently reviewed.  Normal sinus rhythm with poor R wave progression.   Assessment/Plan Principal Problem:   Hypotension Active Problems:   ARF (acute renal failure) (HCC)   Polypharmacy   Chronic pain   Bipolar disorder (HCC)   Hypertension   Hypoxia   Dehydration   Anemia   Hypokalemia   Tobacco abuse   1 hypotension Questionable etiology.  Likely multifactorial secondary to hypovolemia and medication induced as patient noted to be on multiple sedative medications including Seroquel 400 mg twice daily, Xanax 1 mg twice daily as needed for anxiety, Celebrex 200 mg daily as needed, Flexeril 10 mg 3 times daily, Percocet 10/325 every 4 hours as needed pain, Paxil 40 mg daily.  Doubt this is an infectious etiology as patient is afebrile, with a normal white count, normal chest x-ray, urinalysis unremarkable.  Lactic acid within normal limits.  Patient with no overt bleeding and doubt this is hemorrhagic.  Patient's blood pressure responded to IV fluids given in the ED.  We will give a 1 L bolus of normal saline.  Placed on normal saline at 125 cc/h.  Follow.  2.  Acute renal failure Likely secondary to prerenal azotemia in the setting of ACE inhibitor.  Patient noted on admission to have blood pressure of 69/47.  Urinalysis unremarkable.  Check a urine sodium and urine creatinine.  Hold ACE inhibitor.  Place on IV fluids.  Follow urine output.  Daily weights.  Follow.  If no improvement in renal function or worsening renal function will need a renal ultrasound and consultation with nephrology.  3.  Dehydration IV fluids.  4.  Hypokalemia K. Dur 40 mEq p.o. x1.  5.  Tobacco abuse Tobacco cessation stressed to patient.  Patient  refusing a nicotine patch at this time.  Follow.  6.  Hypoxia Questionable etiology.  Patient noted to be hypoxic on presentation to the ED with sats of 75% and had to be placed on a nonrebreather.  Likely secondary to hypoventilation from polypharmacy.  Patient noted to have pinpoint pupils per ED note on presentation and subsequently given 2 doses of IV Narcan.  Patient now on 2 L nasal cannula with sats of 95% or greater.  Chest x-ray unremarkable.  Repeat chest x-ray in the morning.  Discontinue ABG.  Follow.  7.  Chronic pain Patient on chronic pain medications.  Patient presented with hypotension hypoxia felt likely to be medication induced as patient on multiple sedating medications including Xanax, high doses of Seroquel, Percocet 10/325,??  Flexeril, Paxil.  Patient more alert.  Patient noted to have pinpoint pupils on presentation to the ED and received Narcan x2.  Will place on oxycodone 5 mg every 6 hours as needed for pain.  Flexeril 5 mg twice daily as needed.  Hold Xanax for now and placed on hydroxyzine as needed for anxiety.  Patient was on Seroquel 400 mg twice daily and as such we will place on Seroquel 100 mg nightly.  Follow.  8.  Hypertension Patient had presented hypotensive and as such we will hold antihypertensive medications of lisinopril.  Follow.  9.  Bipolar disorder/anxiety Continue home dose Paxil.  Hydroxyzine as needed for anxiety.  Hold Xanax for now.  10.  Congestion Patient with complaints of congestion for the past several days.  Placed on Claritin 10 mg daily, Flonase.  11.  Anemia Patient with no  overt bleeding.  Check an anemia panel.  Follow H&H.  Transfusion threshold hemoglobin less than 7.  DVT prophylaxis: Heparin Code Status: Full Family Communication: Updated patient.  No family at bedside. Disposition Plan: Likely home when clinically improved, resolution of hypotension and acute renal failure. Consults called: None Admission status: Place in  observation/stepdown unit   Irine Seal MD [facility]  If 7PM-7AM, please contact night-coverage [email or web address]  08/19/2018, 5:20 PM

## 2018-08-20 ENCOUNTER — Observation Stay (HOSPITAL_COMMUNITY): Payer: Medicare Other

## 2018-08-20 ENCOUNTER — Inpatient Hospital Stay (HOSPITAL_COMMUNITY): Payer: Medicare Other

## 2018-08-20 DIAGNOSIS — E872 Acidosis: Secondary | ICD-10-CM | POA: Diagnosis present

## 2018-08-20 DIAGNOSIS — T40601A Poisoning by unspecified narcotics, accidental (unintentional), initial encounter: Secondary | ICD-10-CM | POA: Diagnosis present

## 2018-08-20 DIAGNOSIS — J9601 Acute respiratory failure with hypoxia: Secondary | ICD-10-CM | POA: Diagnosis present

## 2018-08-20 DIAGNOSIS — I959 Hypotension, unspecified: Secondary | ICD-10-CM | POA: Diagnosis not present

## 2018-08-20 DIAGNOSIS — G894 Chronic pain syndrome: Secondary | ICD-10-CM | POA: Diagnosis present

## 2018-08-20 DIAGNOSIS — F319 Bipolar disorder, unspecified: Secondary | ICD-10-CM | POA: Diagnosis present

## 2018-08-20 DIAGNOSIS — T424X1A Poisoning by benzodiazepines, accidental (unintentional), initial encounter: Secondary | ICD-10-CM | POA: Diagnosis present

## 2018-08-20 DIAGNOSIS — E861 Hypovolemia: Secondary | ICD-10-CM | POA: Diagnosis present

## 2018-08-20 DIAGNOSIS — D638 Anemia in other chronic diseases classified elsewhere: Secondary | ICD-10-CM | POA: Diagnosis present

## 2018-08-20 DIAGNOSIS — T43591A Poisoning by other antipsychotics and neuroleptics, accidental (unintentional), initial encounter: Secondary | ICD-10-CM | POA: Diagnosis present

## 2018-08-20 DIAGNOSIS — I10 Essential (primary) hypertension: Secondary | ICD-10-CM | POA: Diagnosis present

## 2018-08-20 DIAGNOSIS — R0902 Hypoxemia: Secondary | ICD-10-CM | POA: Diagnosis present

## 2018-08-20 DIAGNOSIS — Z79899 Other long term (current) drug therapy: Secondary | ICD-10-CM | POA: Diagnosis not present

## 2018-08-20 DIAGNOSIS — Z20828 Contact with and (suspected) exposure to other viral communicable diseases: Secondary | ICD-10-CM | POA: Diagnosis present

## 2018-08-20 DIAGNOSIS — N179 Acute kidney failure, unspecified: Secondary | ICD-10-CM | POA: Diagnosis present

## 2018-08-20 DIAGNOSIS — F419 Anxiety disorder, unspecified: Secondary | ICD-10-CM | POA: Diagnosis present

## 2018-08-20 DIAGNOSIS — J9602 Acute respiratory failure with hypercapnia: Secondary | ICD-10-CM | POA: Diagnosis present

## 2018-08-20 DIAGNOSIS — E860 Dehydration: Secondary | ICD-10-CM | POA: Diagnosis present

## 2018-08-20 DIAGNOSIS — Z90710 Acquired absence of both cervix and uterus: Secondary | ICD-10-CM | POA: Diagnosis not present

## 2018-08-20 DIAGNOSIS — J189 Pneumonia, unspecified organism: Secondary | ICD-10-CM | POA: Diagnosis present

## 2018-08-20 DIAGNOSIS — R6889 Other general symptoms and signs: Secondary | ICD-10-CM

## 2018-08-20 DIAGNOSIS — E876 Hypokalemia: Secondary | ICD-10-CM | POA: Diagnosis present

## 2018-08-20 DIAGNOSIS — F315 Bipolar disorder, current episode depressed, severe, with psychotic features: Secondary | ICD-10-CM | POA: Diagnosis not present

## 2018-08-20 DIAGNOSIS — Z87891 Personal history of nicotine dependence: Secondary | ICD-10-CM | POA: Diagnosis not present

## 2018-08-20 DIAGNOSIS — D649 Anemia, unspecified: Secondary | ICD-10-CM | POA: Diagnosis not present

## 2018-08-20 LAB — RESPIRATORY PANEL BY PCR

## 2018-08-20 LAB — BASIC METABOLIC PANEL
Anion gap: 4 — ABNORMAL LOW (ref 5–15)
BUN: 11 mg/dL (ref 6–20)
CO2: 26 mmol/L (ref 22–32)
Calcium: 7.1 mg/dL — ABNORMAL LOW (ref 8.9–10.3)
Chloride: 111 mmol/L (ref 98–111)
Creatinine, Ser: 0.79 mg/dL (ref 0.44–1.00)
GFR calc Af Amer: >60 mL/min (ref >60)
GFR calc non Af Amer: >60 mL/min (ref >60)
Glucose, Bld: 95 mg/dL (ref 70–99)
Potassium: 4.2 mmol/L (ref 3.5–5.1)
Sodium: 141 mmol/L (ref 135–145)

## 2018-08-20 LAB — FERRITIN
Ferritin: 35 ng/mL (ref 11–307)
Ferritin: 43 ng/mL (ref 11–307)

## 2018-08-20 LAB — HIV ANTIBODY (ROUTINE TESTING W REFLEX): HIV Screen 4th Generation wRfx: NONREACTIVE

## 2018-08-20 LAB — IRON AND TIBC
Iron: 34 ug/dL (ref 28–170)
Saturation Ratios: 13 % (ref 10.4–31.8)
TIBC: 269 ug/dL (ref 250–450)
UIBC: 235 ug/dL

## 2018-08-20 LAB — FOLATE: Folate: 7.4 ng/mL (ref >5.9)

## 2018-08-20 LAB — LACTATE DEHYDROGENASE: LDH: 180 U/L (ref 98–192)

## 2018-08-20 LAB — BRAIN NATRIURETIC PEPTIDE: B Natriuretic Peptide: 288.6 pg/mL — ABNORMAL HIGH (ref 0.0–100.0)

## 2018-08-20 LAB — VITAMIN B12: Vitamin B-12: 260 pg/mL (ref 180–914)

## 2018-08-20 LAB — PROCALCITONIN: Procalcitonin: <0.1 ng/mL

## 2018-08-20 LAB — CBC
HCT: 36.1 % (ref 36.0–46.0)
Hemoglobin: 11.1 g/dL — ABNORMAL LOW (ref 12.0–15.0)
MCH: 30.7 pg (ref 26.0–34.0)
MCHC: 30.7 g/dL (ref 30.0–36.0)
MCV: 100 fL (ref 80.0–100.0)
Platelets: 183 10*3/uL (ref 150–400)
RBC: 3.61 MIL/uL — ABNORMAL LOW (ref 3.87–5.11)
RDW: 13.4 % (ref 11.5–15.5)
WBC: 8.1 10*3/uL (ref 4.0–10.5)
nRBC: 0 % (ref 0.0–0.2)

## 2018-08-20 LAB — FIBRINOGEN: Fibrinogen: 463 mg/dL (ref 210–475)

## 2018-08-20 LAB — SEDIMENTATION RATE: Sed Rate: 30 mm/hr — ABNORMAL HIGH (ref 0–22)

## 2018-08-20 LAB — ABO/RH: ABO/RH(D): A NEG

## 2018-08-20 LAB — INFLUENZA PANEL BY PCR (TYPE A & B)
Influenza A By PCR: NEGATIVE
Influenza B By PCR: NEGATIVE

## 2018-08-20 LAB — STREP PNEUMONIAE URINARY ANTIGEN: Strep Pneumo Urinary Antigen: NEGATIVE

## 2018-08-20 LAB — D-DIMER, QUANTITATIVE: D-Dimer, Quant: 0.53 ug/mL-FEU — ABNORMAL HIGH (ref 0.00–0.50)

## 2018-08-20 LAB — SARS CORONAVIRUS 2 BY RT PCR (HOSPITAL ORDER, PERFORMED IN ~~LOC~~ HOSPITAL LAB): SARS Coronavirus 2: NEGATIVE

## 2018-08-20 LAB — GLUCOSE, CAPILLARY: Glucose-Capillary: 98 mg/dL (ref 70–99)

## 2018-08-20 MED ORDER — SODIUM CHLORIDE 0.9 % IV SOLN
1.0000 g | INTRAVENOUS | Status: DC
  Administered 2018-08-20: 1 g via INTRAVENOUS
  Filled 2018-08-20: qty 1

## 2018-08-20 MED ORDER — SODIUM CHLORIDE (PF) 0.9 % IJ SOLN
INTRAMUSCULAR | Status: AC
  Filled 2018-08-20: qty 50

## 2018-08-20 MED ORDER — ENOXAPARIN SODIUM 40 MG/0.4ML ~~LOC~~ SOLN
40.0000 mg | SUBCUTANEOUS | Status: DC
  Administered 2018-08-20: 40 mg via SUBCUTANEOUS
  Filled 2018-08-20: qty 0.4

## 2018-08-20 MED ORDER — MAGNESIUM SULFATE 2 GM/50ML IV SOLN
2.0000 g | Freq: Once | INTRAVENOUS | Status: AC
  Administered 2018-08-20: 2 g via INTRAVENOUS
  Filled 2018-08-20: qty 50

## 2018-08-20 MED ORDER — HYDROCOD POLST-CPM POLST ER 10-8 MG/5ML PO SUER: 5.0000 mL | Freq: Two times a day (BID) | ORAL | Status: DC | PRN

## 2018-08-20 MED ORDER — FLUTICASONE PROPIONATE 50 MCG/ACT NA SUSP: 2.0000 | Freq: Every day | NASAL | 0 refills | Status: DC

## 2018-08-20 MED ORDER — LISINOPRIL 10 MG PO TABS: 10.0000 mg | ORAL_TABLET | Freq: Every day | ORAL | 0 refills | Status: DC

## 2018-08-20 MED ORDER — SODIUM CHLORIDE 0.9 % IV SOLN
INTRAVENOUS | Status: DC | PRN
  Administered 2018-08-20: 250 mL via INTRAVENOUS

## 2018-08-20 MED ORDER — IPRATROPIUM BROMIDE HFA 17 MCG/ACT IN AERS
2.0000 | INHALATION_SPRAY | Freq: Two times a day (BID) | RESPIRATORY_TRACT | Status: DC
  Administered 2018-08-21: 2 via RESPIRATORY_TRACT

## 2018-08-20 MED ORDER — IOHEXOL 300 MG/ML  SOLN
75.0000 mL | Freq: Once | INTRAMUSCULAR | Status: AC | PRN
  Administered 2018-08-20: 75 mL via INTRAVENOUS

## 2018-08-20 MED ORDER — IPRATROPIUM BROMIDE HFA 17 MCG/ACT IN AERS
2.0000 | INHALATION_SPRAY | Freq: Four times a day (QID) | RESPIRATORY_TRACT | Status: DC
  Administered 2018-08-20: 2 via RESPIRATORY_TRACT
  Filled 2018-08-20: qty 12.9

## 2018-08-20 MED ORDER — ALPRAZOLAM 0.5 MG PO TABS
0.5000 mg | ORAL_TABLET | Freq: Two times a day (BID) | ORAL | Status: DC
  Administered 2018-08-20 – 2018-08-21 (×3): 0.5 mg via ORAL
  Filled 2018-08-20 (×3): qty 1

## 2018-08-20 MED ORDER — GUAIFENESIN-DM 100-10 MG/5ML PO SYRP: 10.0000 mL | ORAL_SOLUTION | ORAL | Status: DC | PRN

## 2018-08-20 MED ORDER — GUAIFENESIN ER 600 MG PO TB12
1200.0000 mg | ORAL_TABLET | Freq: Two times a day (BID) | ORAL | Status: DC
  Administered 2018-08-20 – 2018-08-21 (×3): 1200 mg via ORAL
  Filled 2018-08-20 (×3): qty 2

## 2018-08-20 MED ORDER — CHLORHEXIDINE GLUCONATE CLOTH 2 % EX PADS
6.0000 | MEDICATED_PAD | Freq: Every day | CUTANEOUS | Status: DC
  Administered 2018-08-21: 6 via TOPICAL

## 2018-08-20 MED ORDER — SODIUM CHLORIDE 0.9 % IV SOLN
500.0000 mg | INTRAVENOUS | Status: DC
  Administered 2018-08-20: 500 mg via INTRAVENOUS
  Filled 2018-08-20: qty 500

## 2018-08-20 MED ORDER — BACLOFEN 10 MG PO TABS: 5.0000 mg | ORAL_TABLET | Freq: Three times a day (TID) | ORAL | 0 refills | Status: DC

## 2018-08-20 MED ORDER — QUETIAPINE FUMARATE 400 MG PO TABS: 200.0000 mg | ORAL_TABLET | Freq: Two times a day (BID) | ORAL | 0 refills | Status: DC

## 2018-08-20 MED ORDER — ALPRAZOLAM 1 MG PO TABS: 0.5000 mg | ORAL_TABLET | Freq: Two times a day (BID) | ORAL | 0 refills | Status: DC | PRN

## 2018-08-20 NOTE — Progress Notes (Signed)
PROGRESS NOTE    [name]  IPJ:[id number] DOB: 1976-02-19 DOA: 08/19/2018 PCP: Chevis Pretty, FNP    Brief Narrative:  [name] is a 43 y.o. female with medical history significant of chronic pain syndrome on chronic pain medications, anxiety, bipolar disorder who presents to the ED secondary to weakness in the lower extremities and weakness all over.  Patient states she went to watch her grandchildren however just as she was getting there her legs felt weak and went limp under her like they felt like spaghetti.  Patient with complaints of generalized weakness and stated that she almost fell.  Patient denies any fevers, no chills, no chest pain, no shortness of breath, no nausea, no vomiting, no cough, no syncope, no diarrhea, no constipation, no dysuria, no melena, no hematemesis, no hematochezia, no syncopal episodes, no focal neurological deficits.  Patient endorses good oral intake.  Patient endorses good urine output.  Patient states has been congested over the past 3 to 4 days prior to admission.  Patient stated her pain medication recently changed from hydrocodone to oxycodone approximately a week ago.  ED Course: Patient seen in the ED noted to be hypotensive and hypoxic.  Patient noted to have a blood pressure of 69/47 on presentation to the ED with sats of 75% on room air.  Chest x-ray done was negative for any acute infiltrates however did show mild left basilar atelectasis.  EKG done with no ischemic changes noted.  VBG done with a pH of 7.24, PCO2 of 68, PO2 of 140, bicarb of 29.  Comprehensive metabolic profile with a creatinine of 2.09, bilirubin of 0.2 otherwise was within normal limits.  First set of troponin was negative.  Lactic acid level was 1.1.  CBC done with a hemoglobin of 13.4.  Repeat H&H done with a hemoglobin down to 10.2.  TSH noted to be at 6.110.  Blood glucose level obtained was 79.  SARS COVID2  Via Abbott ID was negative.  Allises was nitrite  negative leukocytes negative with a specific gravity of 1.020.  Blood cultures were ordered.  MRSA PCR was pending.  Patient given an amp of D50, IV Narcan x2 in the ED as patient noted to have pinpoint pupils.  Patient given IV fluids with improvement with blood pressure.  Patient also initially was on a nonrebreather and subsequently hypoxia improved and patient now on 2 L nasal cannula.  Hospitalist were called to admit the patient for further evaluation and management.   Assessment & Plan:   Principal Problem:   Hypotension Active Problems:   CAP (community acquired pneumonia)   Suspected Covid-19 Virus Infection   ARF (acute renal failure) (HCC)   Polypharmacy   Chronic pain   Bipolar disorder (HCC)   Hypertension   Hypoxia   Dehydration   Anemia   Hypokalemia   Tobacco abuse  1 suspected COVID-19 virus infection/community-acquired pneumonia Patient had presented to the ED with generalized lower extremity weakness and on presentation noted to be hypotensive with blood pressure of 69/47 that responded to IV fluids.  Patient noted to have sats of 75% on room air which improved on 2 L nasal cannula.  SARS-CoV-2 Abbott ID which was done was negative.  Patient did have complaints of congestion a few weeks prior to admission.  Patient is O2 improved to greater than 92% on 2 L nasal cannula.  Patient did not have any productive cough.  Chest x-ray done on admission was negative.  Patient hydrated with  IV fluids.  Repeat chest x-ray done on 08/20/2018 with bilateral infiltrates noted.  Concern for suspected COVID versus community-acquired pneumonia.  Will repeat SARS-CoV-2 test, check a d-dimer, check a ferritin, check LDH, check a interleukin-6 plasma, check a urine Legionella antigen, check a urine pneumococcus antigen, check a procalcitonin level, check a phosphorus level.  Check a CT chest.  Place on droplet and contact isolation.  Place empirically on IV Rocephin and IV azithromycin pending  study.  Albuterol and Atrovent MDIs.  Supportive care.  Case discussed with ID who recommended treating patient has a suspected COVID-19 virus infection.  If patient's hypoxia improves and patient improves clinically on discharge patient will need 2-week quarantine.  Follow.  2.  Hypotension Felt likely secondary to hypovolemia.  Blood pressure responded to IV fluids.  Continue IV fluids.  Follow.  3.  Hypertension Continue to hold antihypertensive medications.  4.  Acute renal failure Likely secondary to prerenal azotemia in the setting of ACE inhibitor.  Urinalysis nitrite negative, leukocytes negative.  Urine sodium of 78.  Renal function improving with hydration.  Continue IV fluids.  Follow.  5.  Anemia Anemia panel consistent with anemia of chronic disease.  Patient with no overt bleeding.  H&H stable.  Follow.  6.  Hypokalemia Repleted.  7.  Chronic pain Patient on chronic pain medications.  Patient with present hypotension hypoxia felt likely to be medication induced as patient on multiple sedating medications including Xanax, high-dose Seroquel, Percocet, Paxil.  Patient noted to have pinpoint pupils on admission and given Narcan x2.  Continue oxycodone 5 mg every 6 hours as needed for pain.  Continue Flexeril 5 mg twice daily as needed.  Placed back on Xanax 0.5 mg twice daily.  Patient noted to be on Seroquel 400 mg twice daily.  Patient placed on Seroquel 100 mg nightly which we will continue for now.  8.  Bipolar disorder/anxiety Stable.  Continue Paxil.  9.  Congestion Continue Claritin and Flonase.  10.  Tobacco abuse Tobacco cessation.  Nicotine patch.   DVT prophylaxis: Lovenox Code Status: Full Family Communication: Updated patient.  No family at bedside. Disposition Plan: Likely home when clinically improved and no longer hypoxic.  May need to be quarantined on discharge.   Consultants:   Curb sided ID: Dr.Van Dam 08/20/2018  Procedures:  Chest x-ray  08/19/2018, 08/20/2018  Antimicrobials:   IV Rocephin 08/20/2018  IV azithromycin 08/20/2018   Subjective: Patient sitting in bed.  States she feels better.  Weakness improved.  Denies any chest pain.  Denies any significant shortness of breath.  Noted to have some hypoxia on room air that has improved since admission.  Patient satting 89%-92% on room air.  Patient with no overt coughing in the room.  Objective: Vitals:   08/20/18 1157 08/20/18 1200 08/20/18 1300 08/20/18 1400  BP:  126/71 126/67 125/80  Pulse:  85 75 69  Resp:  19 14 (!) 25  Temp: 98.8 F (37.1 C)     TempSrc: Oral     SpO2:  93% (!) 89% 92%  Weight:      Height:        Intake/Output Summary (Last 24 hours) at 08/20/2018 1507 Last data filed at 08/20/2018 1049 Gross per 24 hour  Intake 2314.11 ml  Output 1995 ml  Net 319.11 ml   Filed Weights   08/19/18 1552 08/20/18 0500  Weight: 59.3 kg 61.3 kg    Examination:  General exam: Appears calm and comfortable  Respiratory  system: Clear to auscultation. Respiratory effort normal.  No wheezes, no crackles, no rhonchi. Cardiovascular system: S1 & S2 heard, RRR. No JVD, murmurs, rubs, gallops or clicks. No pedal edema. Gastrointestinal system: Abdomen is nondistended, soft and nontender. No organomegaly or masses felt. Normal bowel sounds heard. Central nervous system: Alert and oriented. No focal neurological deficits. Extremities: Symmetric 5 x 5 power. Skin: No rashes, lesions or ulcers Psychiatry: Judgement and insight appear normal. Mood & affect appropriate.     Data Reviewed: I have personally reviewed following labs and imaging studies  CBC: Recent Labs  Lab 08/19/18 0930 08/19/18 1019 08/20/18 0259  WBC 8.4  --  8.1  NEUTROABS 3.9  --   --   HGB 13.4 10.2* 11.1*  HCT 43.0 30.0* 36.1  MCV 98.4  --  100.0  PLT 213  --  001   Basic Metabolic Panel: Recent Labs  Lab 08/19/18 0930 08/19/18 1019 08/19/18 1736 08/20/18 0259  NA 135 136   --  141  K 4.0 3.3*  --  4.2  CL 97*  --   --  111  CO2 29  --   --  26  GLUCOSE 79  --   --  95  BUN 19  --   --  11  CREATININE 2.09*  --   --  0.79  CALCIUM 9.1  --   --  7.1*  MG  --   --  1.7  --   PHOS  --   --  3.1  --    GFR: Estimated Creatinine Clearance: 76.2 mL/min (by C-G formula based on SCr of 0.79 mg/dL). Liver Function Tests: Recent Labs  Lab 08/19/18 0930  AST 23  ALT 13  ALKPHOS 111  BILITOT 0.2*  PROT 7.4  ALBUMIN 4.0   No results for input(s): LIPASE, AMYLASE in the last 168 hours. No results for input(s): AMMONIA in the last 168 hours. Coagulation Profile: No results for input(s): INR, PROTIME in the last 168 hours. Cardiac Enzymes: Recent Labs  Lab 08/19/18 0930  TROPONINI <0.03   BNP (last 3 results) No results for input(s): PROBNP in the last 8760 hours. HbA1C: No results for input(s): HGBA1C in the last 72 hours. CBG: Recent Labs  Lab 08/19/18 0922 08/19/18 1058 08/20/18 0803  GLUCAP 59* 87 98   Lipid Profile: No results for input(s): CHOL, HDL, LDLCALC, TRIG, CHOLHDL, LDLDIRECT in the last 72 hours. Thyroid Function Tests: Recent Labs    08/19/18 0930 08/19/18 1736  TSH 6.110*  --   FREET4  --  0.40*   Anemia Panel: Recent Labs    08/20/18 0259  VITAMINB12 260  FOLATE 7.4  FERRITIN 35  TIBC 269  IRON 34   Sepsis Labs: Recent Labs  Lab 08/19/18 0930  LATICACIDVEN 1.1    Recent Results (from the past 240 hour(s))  Culture, blood (routine x 2)     Status: None (Preliminary result)   Collection Time: 08/19/18  9:30 AM  Result Value Ref Range Status   Specimen Description   Final    BLOOD RIGHT ANTECUBITAL Performed at [facility], [address] [place], [place] [address]    Special Requests   Final    BOTTLES DRAWN AEROBIC AND ANAEROBIC Blood Culture adequate volume Performed at [facility], [address] [place], [place] [address]    Culture   Final    NO GROWTH < 24  HOURS Performed at [facility], [place] [address], [place], [place] [address]    Report Status PENDING  Incomplete  Culture, blood (routine x 2)     Status: None (Preliminary result)   Collection Time: 08/19/18  9:50 AM  Result Value Ref Range Status   Specimen Description   Final    BLOOD RIGHT HAND Performed at [facility], [place] [address] [place], [place] [address]    Special Requests   Final    BOTTLES DRAWN AEROBIC ONLY Blood Culture results may not be optimal due to an inadequate volume of blood received in culture bottles Performed at [facility], [address] [place], [place] [address]    Culture   Final    NO GROWTH < 24 HOURS Performed at [facility], [place] [address] [place], [place] [address]    Report Status PENDING  Incomplete  SARS Coronavirus 2 (Hosp order,Performed in [facility] lab via Abbott ID)     Status: None   Collection Time: 08/19/18 11:25 AM  Result Value Ref Range Status   SARS Coronavirus 2 (Abbott ID Now) NEGATIVE NEGATIVE Final    Comment: (NOTE) Interpretive Result Comment(s): COVID 19 Positive SARS CoV 2 target nucleic acids are DETECTED. The SARS CoV 2 RNA is generally detectable in upper and lower respiratory specimens during the acute phase of infection.  Positive results are indicative of active infection with SARS CoV 2.  Clinical correlation with patient history and other diagnostic information is necessary to determine patient infection status.  Positive results do not rule out bacterial infection or coinfection with other viruses. The expected result is Negative. COVID 19 Negative SARS CoV 2 target nucleic acids are NOT DETECTED. The SARS CoV 2 RNA is generally detectable in upper and lower respiratory specimens during the acute phase of infection.  Negative results do not preclude SARS CoV 2 infection, do not rule out coinfections with other pathogens, and should not be used as the sole basis for  treatment or other patient management decisions.  Negative results must be combined with clinical  observations, patient history, and epidemiological information. The expected result is Negative. Invalid Presence or absence of SARS CoV 2 nucleic acids cannot be determined. Repeat testing was performed on the submitted specimen and repeated Invalid results were obtained.  If clinically indicated, additional testing on a new specimen with an alternate test methodology [id number]) is advised.  The SARS CoV 2 RNA is generally detectable in upper and lower respiratory specimens during the acute phase of infection. The expected result is Negative. Fact Sheet for Patients:  GolfingFamily.no Fact Sheet for Healthcare Providers: https://www.hernandez-brewer.com/ This test is not yet approved or cleared by the [place] FDA and has been authorized for detection and/or diagnosis of SARS CoV 2 by FDA under an Emergency Use Authorization (EUA).  This EUA will remain in effect (meaning this test can be used) for the duration of the COVID19 d eclaration under Section 564(b)(1) of the Act, 21 U.S.C. section [phone] 3(b)(1), unless the authorization is terminated or revoked sooner. Performed at [facility], [address] [place], [place] [address]   MRSA PCR Screening     Status: None   Collection Time: 08/19/18  4:09 PM  Result Value Ref Range Status   MRSA by PCR NEGATIVE NEGATIVE Final    Comment:        The GeneXpert MRSA Assay (FDA approved for NASAL specimens only), is one component of a  comprehensive MRSA colonization surveillance program. It is not intended to diagnose MRSA infection nor to guide or monitor treatment for MRSA infections. Performed at [facility], [place] [address], [place], [place] [address]          Radiology Studies: Dg Chest Port 1 View  Result Date: 08/20/2018 CLINICAL DATA:  Hypoxia  EXAM: PORTABLE CHEST 1 VIEW COMPARISON:  Yesterday FINDINGS: New indistinct opacities, streaky behind the heart and on the right mainly subpleural. No effusion or pneumothorax. Normal heart size and mediastinal contours. IMPRESSION: New patchy bilateral infiltrate likely reflecting pneumonia. Electronically Signed   By: Monte Fantasia M.D.   On: 08/20/2018 05:29   Dg Chest Portable 1 View  Result Date: 08/19/2018 CLINICAL DATA:  Fatigue EXAM: PORTABLE CHEST 1 VIEW COMPARISON:  07/22/2018 FINDINGS: Cardiac shadows within normal limits. Mild left basilar atelectasis is seen. No focal infiltrate or effusion is noted. No bony abnormality is seen. IMPRESSION: Mild left basilar atelectasis. Electronically Signed   By: Inez Catalina M.D.   On: 08/19/2018 09:53        Scheduled Meds:  ALPRAZolam  0.5 mg Oral BID   enoxaparin (LOVENOX) injection  40 mg Subcutaneous Q24H   fluticasone  2 spray Each Nare Daily   guaiFENesin  1,200 mg Oral BID   ipratropium  2 puff Inhalation Q6H   loratadine  10 mg Oral Daily   mouth rinse  15 mL Mouth Rinse BID   pantoprazole  40 mg Oral Daily   PARoxetine  40 mg Oral [id number]   polyethylene glycol  17 g Oral Daily   QUEtiapine  100 mg Oral QHS   senna  1 tablet Oral BID   sodium chloride flush  3 mL Intravenous Q12H   Continuous Infusions:  sodium chloride 75 mL/hr at 08/20/18 1049   azithromycin     cefTRIAXone (ROCEPHIN)  IV       LOS: 0 days    Time spent: 40 minutes    Irine Seal, MD [facility]  If 7PM-7AM, please contact night-coverage [email or web address] 08/20/2018, 3:07 PM
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
## 2018-08-20 NOTE — Care Management Obs Status (Signed)
[facility] NOTIFICATION   Patient Details  Name: [name] MRN: [id number] Date of Birth: 1975/10/28   Medicare Observation Status Notification Given:  Yes    Joaquin Courts, RN 08/20/2018, 2:45 PM
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
## 2018-08-21 ENCOUNTER — Other Ambulatory Visit: Payer: Self-pay | Admitting: Nurse Practitioner

## 2018-08-21 DIAGNOSIS — J9602 Acute respiratory failure with hypercapnia: Secondary | ICD-10-CM

## 2018-08-21 DIAGNOSIS — N179 Acute kidney failure, unspecified: Secondary | ICD-10-CM

## 2018-08-21 DIAGNOSIS — F315 Bipolar disorder, current episode depressed, severe, with psychotic features: Secondary | ICD-10-CM

## 2018-08-21 DIAGNOSIS — J9601 Acute respiratory failure with hypoxia: Secondary | ICD-10-CM

## 2018-08-21 DIAGNOSIS — I959 Hypotension, unspecified: Secondary | ICD-10-CM

## 2018-08-21 LAB — CBC WITH DIFFERENTIAL/PLATELET
Abs Immature Granulocytes: 0.06 10*3/uL (ref 0.00–0.07)
Basophils Absolute: 0 10*3/uL (ref 0.0–0.1)
Basophils Relative: 0 %
Eosinophils Absolute: 0.1 10*3/uL (ref 0.0–0.5)
Eosinophils Relative: 1 %
HCT: 34.7 % — ABNORMAL LOW (ref 36.0–46.0)
Hemoglobin: 11.2 g/dL — ABNORMAL LOW (ref 12.0–15.0)
Immature Granulocytes: 1 %
Lymphocytes Relative: 25 %
Lymphs Abs: 2.2 10*3/uL (ref 0.7–4.0)
MCH: 31.3 pg (ref 26.0–34.0)
MCHC: 32.3 g/dL (ref 30.0–36.0)
MCV: 96.9 fL (ref 80.0–100.0)
Monocytes Absolute: 0.6 10*3/uL (ref 0.1–1.0)
Monocytes Relative: 7 %
Neutro Abs: 5.9 10*3/uL (ref 1.7–7.7)
Neutrophils Relative %: 66 %
Platelets: 174 10*3/uL (ref 150–400)
RBC: 3.58 MIL/uL — ABNORMAL LOW (ref 3.87–5.11)
RDW: 13.5 % (ref 11.5–15.5)
WBC: 8.9 10*3/uL (ref 4.0–10.5)
nRBC: 0 % (ref 0.0–0.2)

## 2018-08-21 LAB — COMPREHENSIVE METABOLIC PANEL
ALT: 12 U/L (ref 0–44)
AST: 21 U/L (ref 15–41)
Albumin: 3 g/dL — ABNORMAL LOW (ref 3.5–5.0)
Alkaline Phosphatase: 84 U/L (ref 38–126)
Anion gap: 6 (ref 5–15)
BUN: 8 mg/dL (ref 6–20)
CO2: 23 mmol/L (ref 22–32)
Calcium: 7.7 mg/dL — ABNORMAL LOW (ref 8.9–10.3)
Chloride: 112 mmol/L — ABNORMAL HIGH (ref 98–111)
Creatinine, Ser: 0.61 mg/dL (ref 0.44–1.00)
GFR calc Af Amer: >60 mL/min (ref >60)
GFR calc non Af Amer: >60 mL/min (ref >60)
Glucose, Bld: 97 mg/dL (ref 70–99)
Potassium: 3.7 mmol/L (ref 3.5–5.1)
Sodium: 141 mmol/L (ref 135–145)
Total Bilirubin: 0.3 mg/dL (ref 0.3–1.2)
Total Protein: 5.7 g/dL — ABNORMAL LOW (ref 6.5–8.1)

## 2018-08-21 LAB — MAGNESIUM: Magnesium: 2.1 mg/dL (ref 1.7–2.4)

## 2018-08-21 LAB — D-DIMER, QUANTITATIVE: D-Dimer, Quant: 0.79 ug/mL-FEU — ABNORMAL HIGH (ref 0.00–0.50)

## 2018-08-21 LAB — GLUCOSE, CAPILLARY: Glucose-Capillary: 90 mg/dL (ref 70–99)

## 2018-08-21 LAB — FERRITIN: Ferritin: 36 ng/mL (ref 11–307)

## 2018-08-21 LAB — C-REACTIVE PROTEIN: CRP: 1.1 mg/dL — ABNORMAL HIGH (ref <1.0)

## 2018-08-21 LAB — PHOSPHORUS: Phosphorus: 1.3 mg/dL — ABNORMAL LOW (ref 2.5–4.6)

## 2018-08-21 LAB — INTERLEUKIN-6, PLASMA: Interleukin-6, Plasma: 2.8 pg/mL (ref 0.0–12.2)

## 2018-08-21 NOTE — Progress Notes (Signed)
Pt given discharge education. I went over all instructions as well as changes in medications. Pt was given printed prescriptions with her discharge paperwork. Pt verbalized understanding of discharge information. Pts IVs were removed. Pt was discharged with all personal belongings

## 2018-08-21 NOTE — Telephone Encounter (Signed)
Pt needs new RX for home O2 appt scheduled for televisit

## 2018-08-21 NOTE — Discharge Summary (Signed)
Physician Discharge Summary  [name] [id number] DOB: June 03, 1975 DOA: 08/19/2018  PCP: Chevis Pretty, FNP  Admit date: 08/19/2018 Discharge date: 08/21/2018  Admitted From: Home  Disposition:  Home   Recommendations for Outpatient Follow-up:  1. Follow up with PCP in 1 week 2. Please obtain spirometry 3. Please obtain repeat sleep study 4. Please taper patient off Xanax and counsel patient to taper Seroquel and opiates as much as possible    Home Health: None  Equipment/Devices: None  Discharge Condition: Fair  CODE STATUS: FULL Diet recommendation: Regualr  Brief/Interim Summary: [name] is a 43 y.o. F with chronic pain on daily opiates, bipolar disorder who presented with sudden acute weakness and confusion.  Went to watch her grandchildren, just as she was getting there her legs "felt weak and went limp under her like spaghetti".    In the ER, BP 69/47 mmHg and SpO2 75% on RA.  CXR showed atelectasis.  VBG showed acidosis, pCO2 68, creatinine showed AKI 2.09 mg/dL from baseline 0.6, lactate normal.  Noted to have pinpoint pupils, given IV fluids and Narcan and BP improved.         PRINCIPAL HOSPITAL DIAGNOSIS: Acute hypoxic and hypercapnic respiratory failure, likely sedative overuse    Discharge Diagnoses:   Acute hypoxic and hypercapnic respiratory failure Presented with respiratory acidosis, pinpoint pupils, hypoxia.  Was treated with Narcan, IV fluids and improved rapidly overnight.  SARS-CoV-2 testing negative on admission.  Was planning to be discharged on 5/19, next day, but repeat CXR showed new patchy right opacity.  There was concern for false negative CoV testing and so CT chest was obtained, she was moved to isolation, and SARS-CoV-2 testing was repeated.   Repeat Cephiad testing was negative.  Procalcitonin was undetectable, CT chest showed no pneumonia.  She remained weaned on room air.  COVID ruled out.  Besides  opiate/benzo/seroquel overuse, other considerations include hypercapnia from undiagnosed/untreated sleep apnea or COPD. -Spirometry and PSG recommended as outpatient -Strongly recommend tapering opiates   Hypotension Resolved.  Blood cultures negative.  CT chest shows no pneumonia.  Suspect this was from hypovolemia or opiate overuse.  Hypertension  Acute kidney injury From hypotension, ischemic injury.  Urinalysis bland, urine sodium elevated.  Normalized with fluids.    Anemia Likely dilution, no clinical bleeding.  Chronic pain  Bipolar disorder Anxiety  Smoking  Smoking cessation recommended, modalities discussed.             Discharge Instructions  Discharge Instructions    Diet general   Complete by:  As directed    Discharge instructions   Complete by:  As directed    From Dr. Loleta Books: You were admitted with low oxygen levels and confusion.  This was either from a COPD flare or from too much oxycodone, Xanax and Seroquel. The FDA has a black box warning that taking oxycodone/Percocet and Xanax (or any other opiates and benzodiazepines) is associated with an increased risk of death.  In your case, you are also taking two other sedating medicines, Seroquel and baclofen, which makes the situation worse.  Please use as little oxycodone and Xanax as you possibly can until you see your primary care doctor again.  Then discuss with them how to safely taper off all four of those medicines (oxycodone/Percocet, Xanax, Seroquel and baclofen) if possible.  Also, discuss with them to have a sleep study and a spirometry testing.   Increase activity slowly   Complete by:  As directed  Increase activity slowly   Complete by:  As directed      Allergies as of 08/21/2018      Reactions   Amoxicillin Nausea And Vomiting   Chantix [varenicline] Other (See Comments)   Causes bad nightmares, hallucinations   Codeine Other (See Comments)   Divalproex Sodium Other (See  Comments)   sedation   Lithium Other (See Comments)   Drowsiness, tremors drowsiness   Propoxyphene Nausea And Vomiting   Augmentin [amoxicillin-pot Clavulanate] Diarrhea, Other (See Comments)   Zolpidem Other (See Comments)   Patient states it does not agree with her body.       Medication List    STOP taking these medications   cyclobenzaprine 10 MG tablet Commonly known as:  FLEXERIL     TAKE these medications   ALPRAZolam 1 MG tablet Commonly known as:  Xanax Take 0.5 tablets (0.5 mg total) by mouth 2 (two) times daily as needed for anxiety. What changed:  how much to take   baclofen 10 MG tablet Commonly known as:  LIORESAL Take 0.5 tablets (5 mg total) by mouth 3 (three) times daily. What changed:  how much to take   benzonatate 200 MG capsule Commonly known as:  TESSALON Take 1 capsule (200 mg total) by mouth 3 (three) times daily as needed.   celecoxib 200 MG capsule Commonly known as:  CELEBREX Take 200 mg by mouth daily as needed for pain.   cetirizine 10 MG tablet Commonly known as:  ZYRTEC Take 1 tablet (10 mg total) by mouth daily.   fluconazole 150 MG tablet Commonly known as:  Diflucan 1 po q week x 4 weeks   fluticasone 50 MCG/ACT nasal spray Commonly known as:  FLONASE Place 2 sprays into both nostrils daily.   lisinopril 10 MG tablet Commonly known as:  ZESTRIL Take 1 tablet (10 mg total) by mouth daily. Start taking on:  Aug 23, 2018 What changed:  These instructions start on Aug 23, 2018. If you are unsure what to do until then, ask your doctor or other care provider.   nystatin 100000 UNIT/ML suspension Commonly known as:  MYCOSTATIN Take 5 mLs (500,000 Units total) by mouth 4 (four) times daily as needed.   omeprazole 20 MG capsule Commonly known as:  PRILOSEC Take 1 capsule (20 mg total) by mouth daily.   ondansetron 4 MG tablet Commonly known as:  Zofran Take 1 tablet (4 mg total) by mouth every 8 (eight) hours as needed for  nausea or vomiting.   oxyCODONE-acetaminophen 10-325 MG tablet Commonly known as:  PERCOCET Take 1 tablet by mouth every 4 (four) hours as needed for pain.   PARoxetine 40 MG tablet Commonly known as:  Paxil Take 1 tablet (40 mg total) by mouth every morning.   polyethylene glycol 17 g packet Commonly known as:  MIRALAX / GLYCOLAX Take 17 g by mouth daily.   QUEtiapine 400 MG tablet Commonly known as:  SEROquel Take 0.5 tablets (200 mg total) by mouth 2 (two) times daily. What changed:  how much to take   simethicone 80 MG chewable tablet Commonly known as:  MYLICON Chew 1 tablet (80 mg total) by mouth every 6 (six) hours as needed for flatulence.      Follow-up Information    Chevis Pretty, FNP. Schedule an appointment as soon as possible for a visit in 2 week(s).   Specialty:  Family Medicine Contact information: [address] [place] [address] [phone]  Allergies  Allergen Reactions  . Amoxicillin Nausea And Vomiting  . Chantix [Varenicline] Other (See Comments)    Causes bad nightmares, hallucinations  . Codeine Other (See Comments)  . Divalproex Sodium Other (See Comments)    sedation  . Lithium Other (See Comments)    Drowsiness, tremors drowsiness  . Propoxyphene Nausea And Vomiting  . Augmentin [Amoxicillin-Pot Clavulanate] Diarrhea and Other (See Comments)  . Zolpidem Other (See Comments)    Patient states it does not agree with her body.     Consultations:  None   Procedures/Studies: Ct Chest W Contrast  Result Date: 08/20/2018 CLINICAL DATA:  Pneumonia EXAM: CT CHEST WITH CONTRAST TECHNIQUE: Multidetector CT imaging of the chest was performed during intravenous contrast administration. CONTRAST:  54mL OMNIPAQUE IOHEXOL 300 MG/ML  SOLN COMPARISON:  Chest x-ray from same day.  CT dated 07/22/2018 FINDINGS: Cardiovascular: There is a small to moderate-sized pericardial effusion. This is slightly improved from prior  study. There is minimal atherosclerotic changes of the thoracic aorta. There is no evidence of a PE. Mediastinum/Nodes: There are few prominent mediastinal and hilar lymph nodes. No pathologically enlarged axillary or supraclavicular lymph nodes. Lungs/Pleura: There are small bilateral pleural effusions with adjacent compressive atelectasis. Mild emphysematous changes are seen bilaterally. Interlobular septal thickening is noted bilaterally. Upper Abdomen: No acute abnormality. Musculoskeletal: No chest wall abnormality. No acute or significant osseous findings. IMPRESSION: 1. No pneumonia.  No evidence of interstitial lung disease. 2. Small to moderate-sized pericardial effusion, improved from prior study. 3. Trace to small bilateral pleural effusions with associated interlobular septal thickening is consistent with some degree of volume overload. 4. Emphysematous changes bilaterally. 5. No large centrally located pulmonary embolus. Detection of smaller emboli is limited by contrast bolus timing. Emphysema (ICD10-J43.9). Electronically Signed   By: Constance Holster M.D.   On: 08/20/2018 19:37   Dg Chest Port 1 View  Result Date: 08/20/2018 CLINICAL DATA:  Hypoxia EXAM: PORTABLE CHEST 1 VIEW COMPARISON:  Yesterday FINDINGS: New indistinct opacities, streaky behind the heart and on the right mainly subpleural. No effusion or pneumothorax. Normal heart size and mediastinal contours. IMPRESSION: New patchy bilateral infiltrate likely reflecting pneumonia. Electronically Signed   By: Monte Fantasia M.D.   On: 08/20/2018 05:29   Dg Chest Portable 1 View  Result Date: 08/19/2018 CLINICAL DATA:  Fatigue EXAM: PORTABLE CHEST 1 VIEW COMPARISON:  07/22/2018 FINDINGS: Cardiac shadows within normal limits. Mild left basilar atelectasis is seen. No focal infiltrate or effusion is noted. No bony abnormality is seen. IMPRESSION: Mild left basilar atelectasis. Electronically Signed   By: Inez Catalina M.D.   On:  08/19/2018 09:53       Subjective: Feels well.  No cough, fever, sputum.  No dyspnea, confusion.   Discharge Exam: Vitals:   08/21/18 0900 08/21/18 1000  BP: (!) 121/59 (!) 143/81  Pulse: (!) 59   Resp: 20 (!) 21  Temp:    SpO2: 92%    Vitals:   08/21/18 0700 08/21/18 0800 08/21/18 0900 08/21/18 1000  BP: (!) 153/78 125/67 (!) 121/59 (!) 143/81  Pulse: (!) 53 67 (!) 59   Resp: 18 20 20  (!) 21  Temp:  98.9 F (37.2 C)    TempSrc:  Oral    SpO2: 96% 93% 92%   Weight:      Height:        General: Pt is alert, awake, not in acute distress Cardiovascular: RRR, nl S1-S2, no murmurs appreciated.   No LE edema.  Respiratory: Normal respiratory rate and rhythm.  CTAB without rales or wheezes. Abdominal: Abdomen soft and non-tender.  No distension or HSM.   Neuro/Psych: Strength symmetric in upper and lower extremities.  Judgment and insight appear normal.   The results of significant diagnostics from this hospitalization (including imaging, microbiology, ancillary and laboratory) are listed below for reference.     Microbiology: Recent Results (from the past 240 hour(s))  Culture, blood (routine x 2)     Status: None (Preliminary result)   Collection Time: 08/19/18  9:30 AM  Result Value Ref Range Status   Specimen Description   Final    BLOOD RIGHT ANTECUBITAL Performed at [facility], [address] [place], [place] [address]    Special Requests   Final    BOTTLES DRAWN AEROBIC AND ANAEROBIC Blood Culture adequate volume Performed at [facility], [address] [place], [place] [address]    Culture   Final    NO GROWTH 2 DAYS Performed at [facility], [place] [address], [place], [place] [address]    Report Status PENDING  Incomplete  Culture, blood (routine x 2)     Status: None (Preliminary result)   Collection Time: 08/19/18  9:50 AM  Result Value Ref Range Status   Specimen Description   Final    BLOOD RIGHT HAND Performed at  [facility], [place] [address], [place], [place] [address]    Special Requests   Final    BOTTLES DRAWN AEROBIC ONLY Blood Culture results may not be optimal due to an inadequate volume of blood received in culture bottles Performed at [facility], [address] [place], [place] [address]    Culture   Final    NO GROWTH 2 DAYS Performed at [facility], [place] [address] [place], [place] [address]    Report Status PENDING  Incomplete  SARS Coronavirus 2 (Hosp order,Performed in [facility] lab via [employer] ID)     Status: None   Collection Time: 08/19/18 11:25 AM  Result Value Ref Range Status   SARS Coronavirus 2 (Abb[employer] ID Now) NEGATIVE NEGATIVE Final    Comment: (NOTE) Interpretive Result Comment(s): COVID 19 Positive SARS CoV 2 target nucleic acids are DETECTED. The SARS CoV 2 RNA is generally detectable in upper and lower respiratory specimens during the acute phase of infection.  Positive results are indicative of active infection with SARS CoV 2.  Clinical correlation with patient history and other diagnostic information is necessary to determine patient infection status.  Positive results do not rule out bacterial infection or coinfection with other viruses. The expected result is Negative. COVID 19 Negative SARS CoV 2 target nucleic acids are NOT DETECTED. The SARS CoV 2 RNA is generally detectable in upper and lower respiratory specimens during the acute phase of infection.  Negative results do not preclude SARS CoV 2 infection, do not rule out coinfections with other pathogens, and should not be used as the sole basis for treatment or other patient management decisions.  Negative results must be combined with clinical  observations, patient history, and epidemiological information. The expected result is Negative. Invalid Presence or absence of SARS CoV 2 nucleic acids cannot be determined. Repeat testing was performed on the submitted specimen  and repeated Invalid results were obtained.  If clinically indicated, additional testing on a new specimen with an alternate test methodology [phone]) is advised.  The SARS CoV 2 RNA  is generally detectable in upper and lower respiratory specimens during the acute phase of infection. The expected result is Negative. Fact Sheet for Patients:  GolfingFamily.no Fact Sheet for Healthcare Providers: https://www.hernandez-brewer.com/ This test is not yet approved or cleared by the [place] FDA and has been authorized for detection and/or diagnosis of SARS CoV 2 by FDA under an Emergency Use Authorization (EUA).  This EUA will remain in effect (meaning this test can be used) for the duration of the COVID19 d eclaration under Section 564(b)(1) of the Act, 21 U.S.C. section [phone] 3(b)(1), unless the authorization is terminated or revoked sooner. Performed at [facility], [address] [place], [place] [address]   MRSA PCR Screening     Status: None   Collection Time: 08/19/18  4:09 PM  Result Value Ref Range Status   MRSA by PCR NEGATIVE NEGATIVE Final    Comment:        The GeneXpert MRSA Assay (FDA approved for NASAL specimens only), is one component of a comprehensive MRSA colonization surveillance program. It is not intended to diagnose MRSA infection nor to guide or monitor treatment for MRSA infections. Performed at [facility], [place] [address], [place], [place] [address]   SARS Coronavirus 2 [facility] order, Performed in [facility] hospital lab)     Status: None   Collection Time: 08/20/18  2:44 PM  Result Value Ref Range Status   SARS Coronavirus 2 NEGATIVE NEGATIVE Final    Comment: (NOTE) If result is NEGATIVE SARS-CoV-2 target nucleic acids are NOT DETECTED. The SARS-CoV-2 RNA is generally detectable in upper and lower  respiratory specimens during the acute phase of infection. The lowest   concentration of SARS-CoV-2 viral copies this assay can detect is 250  copies / mL. A negative result does not preclude SARS-CoV-2 infection  and should not be used as the sole basis for treatment or other  patient management decisions.  A negative result may occur with  improper specimen collection / handling, submission of specimen other  than nasopharyngeal swab, presence of viral mutation(s) within the  areas targeted by this assay, and inadequate number of viral copies  (<250 copies / mL). A negative result must be combined with clinical  observations, patient history, and epidemiological information. If result is POSITIVE SARS-CoV-2 target nucleic acids are DETECTED. The SARS-CoV-2 RNA is generally detectable in upper and lower  respiratory specimens dur ing the acute phase of infection.  Positive  results are indicative of active infection with SARS-CoV-2.  Clinical  correlation with patient history and other diagnostic information is  necessary to determine patient infection status.  Positive results do  not rule out bacterial infection or co-infection with other viruses. If result is PRESUMPTIVE POSTIVE SARS-CoV-2 nucleic acids MAY BE PRESENT.   A presumptive positive result was obtained on the submitted specimen  and confirmed on repeat testing.  While 2019 novel coronavirus  (SARS-CoV-2) nucleic acids may be present in the submitted sample  additional confirmatory testing may be necessary for epidemiological  and / or clinical management purposes  to differentiate between  SARS-CoV-2 and other Sarbecovirus currently known to infect humans.  If clinically indicated additional testing with an alternate test  methodology [phone]) is advised. The SARS-CoV-2 RNA is generally  detectable in upper and lower respiratory sp ecimens during the acute  phase of infection. The expected result is Negative. Fact Sheet for Patients:  StrictlyIdeas.no Fact Sheet  for Healthcare Providers: BankingDealers.co.za This test is  not yet approved or cleared by the [place] and has been authorized for detection and/or diagnosis of SARS-CoV-2 by FDA under an Emergency Use Authorization (EUA).  This EUA will remain in effect (meaning this test can be used) for the duration of the COVID-19 declaration under Section 564(b)(1) of the Act, 21 U.S.C. section 360bbb-3(b)(1), unless the authorization is terminated or revoked sooner. Performed at [facility], [place] [address], [place], [place] [address]   Culture, blood (Routine X 2) w Reflex to ID Panel     Status: None (Preliminary result)   Collection Time: 08/20/18  3:40 PM  Result Value Ref Range Status   Specimen Description   Final    BLOOD LEFT HAND Performed at [place] [address] [place], [place] [address]    Special Requests   Final    AEB BCHV Performed at [facility], [place] [address], [place], [place] [address]    Culture   Final    NO GROWTH < 24 HOURS Performed at [place] [address], [place], [place] [address]    Report Status PENDING  Incomplete  Culture, blood (Routine X 2) w Reflex to ID Panel     Status: None (Preliminary result)   Collection Time: 08/20/18  3:40 PM  Result Value Ref Range Status   Specimen Description   Final    BLOOD LEFT ARM Performed at [place] [address], [place], [place] [address]    Special Requests   Final    AEB BCAV Performed at [place] [address], [place], [place] [address]    Culture   Final    NO GROWTH < 24 HOURS Performed at [place] [address] [place], [place] [address]    Report Status PENDING  Incomplete  Respiratory Panel by PCR     Status: None   Collection Time: 08/20/18  3:57 PM  Result Value Ref Range Status   Adenovirus NOT DETECTED NOT DETECTED Final    Coronavirus 229E NOT DETECTED NOT DETECTED Final    Comment: (NOTE) The Coronavirus on the Respiratory Panel, DOES NOT test for the novel  Coronavirus (2019 nCoV)    Coronavirus HKU1 NOT DETECTED NOT DETECTED Final   Coronavirus NL63 NOT DETECTED NOT DETECTED Final   Coronavirus OC43 NOT DETECTED NOT DETECTED Final   Metapneumovirus NOT DETECTED NOT DETECTED Final   Rhinovirus / Enterovirus NOT DETECTED NOT DETECTED Final   Influenza A NOT DETECTED NOT DETECTED Final   Influenza B NOT DETECTED NOT DETECTED Final   Parainfluenza Virus 1 NOT DETECTED NOT DETECTED Final   Parainfluenza Virus 2 NOT DETECTED NOT DETECTED Final   Parainfluenza Virus 3 NOT DETECTED NOT DETECTED Final   Parainfluenza Virus 4 NOT DETECTED NOT DETECTED Final   Respiratory Syncytial Virus NOT DETECTED NOT DETECTED Final   Bordetella pertussis NOT DETECTED NOT DETECTED Final   Chlamydophila pneumoniae NOT DETECTED NOT DETECTED Final   Mycoplasma pneumoniae NOT DETECTED NOT DETECTED Final    Comment: Performed at [facility], [address], [place], [place] [address]     Labs: BNP (last 3 results) Recent Labs    08/20/18 1540  BNP 623.7*   Basic Metabolic Panel: Recent Labs  Lab 08/19/18 0930 08/19/18 1019 08/19/18 1736 08/20/18 0259 08/21/18 0240  NA 135 136  --  141 141  K 4.0 3.3*  --  4.2 3.7  CL 97*  --   --  111 112*  CO2 29  --   --  26 23  GLUCOSE 79  --   --  95 97  BUN 19  --   --  11 8  CREATININE 2.09*  --   --  0.79 0.61  CALCIUM 9.1  --   --  7.1* 7.7*  MG  --   --  1.7  --  2.1  PHOS  --   --  3.1  --  1.3*   Liver Function Tests: Recent Labs  Lab 08/19/18 0930 08/21/18 0240  AST 23 21  ALT 13 12  ALKPHOS 111 84  BILITOT 0.2* 0.3  PROT 7.4 5.7*  ALBUMIN 4.0 3.0*   No results for input(s): LIPASE, AMYLASE in the last 168 hours. No results for input(s): AMMONIA in the last 168 hours. CBC: Recent Labs  Lab 08/19/18 0930 08/19/18 1019 08/20/18 0259  08/21/18 0240  WBC 8.4  --  8.1 8.9  NEUTROABS 3.9  --   --  5.9  HGB 13.4 10.2* 11.1* 11.2*  HCT 43.0 30.0* 36.1 34.7*  MCV 98.4  --  100.0 96.9  PLT 213  --  183 174   Cardiac Enzymes: Recent Labs  Lab 08/19/18 0930  TROPONINI <0.03   BNP: Invalid input(s): POCBNP CBG: Recent Labs  Lab 08/19/18 0922 08/19/18 1058 08/20/18 0803 08/21/18 0823  GLUCAP 59* 87 98 90   D-Dimer Recent Labs    08/20/18 1540 08/21/18 0240  DDIMER 0.53* 0.79*   Hgb A1c No results for input(s): HGBA1C in the last 72 hours. Lipid Profile No results for input(s): CHOL, HDL, LDLCALC, TRIG, CHOLHDL, LDLDIRECT in the last 72 hours. Thyroid function studies Recent Labs    08/19/18 0930  TSH 6.110*   Anemia work up Recent Labs    08/20/18 0259 08/20/18 1540 08/21/18 0244  VITAMINB12 260  --   --   FOLATE 7.4  --   --   FERRITIN 35 43 36  TIBC 269  --   --   IRON 34  --   --    Urinalysis    Component Value Date/Time   COLORURINE YELLOW 08/19/2018 [place] 08/19/2018 0956   APPEARANCEUR Clear 05/06/2018 1407   LABSPEC 1.020 08/19/2018 0956   PHURINE 6.0 08/19/2018 0956   GLUCOSEU NEGATIVE 08/19/2018 0956   HGBUR NEGATIVE 08/19/2018 0956   BILIRUBINUR NEGATIVE 08/19/2018 0956   BILIRUBINUR Negative 05/06/2018 1407   KETONESUR NEGATIVE 08/19/2018 0956   PROTEINUR NEGATIVE 08/19/2018 0956   UROBILINOGEN 0.2 01/13/2015 1359   NITRITE NEGATIVE 08/19/2018 0956   LEUKOCYTESUR NEGATIVE 08/19/2018 0956   Sepsis Labs Invalid input(s): PROCALCITONIN,  WBC,  LACTICIDVEN Microbiology Recent Results (from the past 240 hour(s))  Culture, blood (routine x 2)     Status: None (Preliminary result)   Collection Time: 08/19/18  9:30 AM  Result Value Ref Range Status   Specimen Description   Final    BLOOD RIGHT ANTECUBITAL Performed at [facility], [address] [place], [place] [address]    Special Requests   Final    BOTTLES DRAWN AEROBIC AND ANAEROBIC  Blood Culture adequate volume Performed at [facility], [address] [place], [place] [address]    Culture   Final    NO GROWTH 2 DAYS Performed at [facility], [place] [address], [place], [place] [address]    Report Status  PENDING  Incomplete  Culture, blood (routine x 2)     Status: None (Preliminary result)   Collection Time: 08/19/18  9:50 AM  Result Value Ref Range Status   Specimen Description   Final    BLOOD RIGHT HAND Performed at [facility], [place] [address] [place], [place] [address]    Special Requests   Final    BOTTLES DRAWN AEROBIC ONLY Blood Culture results may not be optimal due to an inadequate volume of blood received in culture bottles Performed at [facility], [address] [place], [place] [address]    Culture   Final    NO GROWTH 2 DAYS Performed at [facility], [place] [address], [place], [place] [address]    Report Status PENDING  Incomplete  SARS Coronavirus 2 (Hosp order,Performed in [facility] lab via [employer] ID)     Status: None   Collection Time: 08/19/18 11:25 AM  Result Value Ref Range Status   SARS Coronavirus 2 (Abbott ID Now) NEGATIVE NEGATIVE Final    Comment: (NOTE) Interpretive Result Comment(s): COVID 19 Positive SARS CoV 2 target nucleic acids are DETECTED. The SARS CoV 2 RNA is generally detectable in upper and lower respiratory specimens during the acute phase of infection.  Positive results are indicative of active infection with SARS CoV 2.  Clinical correlation with patient history and other diagnostic information is necessary to determine patient infection status.  Positive results do not rule out bacterial infection or coinfection with other viruses. The expected result is Negative. COVID 19 Negative SARS CoV 2 target nucleic acids are NOT DETECTED. The SARS CoV 2 RNA is generally detectable in upper and lower respiratory specimens during the acute phase of infection.   Negative results do not preclude SARS CoV 2 infection, do not rule out coinfections with other pathogens, and should not be used as the sole basis for treatment or other patient management decisions.  Negative results must be combined with clinical  observations, patient history, and epidemiological information. The expected result is Negative. Invalid Presence or absence of SARS CoV 2 nucleic acids cannot be determined. Repeat testing was performed on the submitted specimen and repeated Invalid results were obtained.  If clinically indicated, additional testing on a new specimen with an alternate test methodology [id number]) is advised.  The SARS CoV 2 RNA is generally detectable in upper and lower respiratory specimens during the acute phase of infection. The expected result is Negative. Fact Sheet for Patients:  GolfingFamily.no Fact Sheet for Healthcare Providers: https://www.hernandez-brewer.com/ This test is not yet approved or cleared by the [place] FDA and has been authorized for detection and/or diagnosis of SARS CoV 2 by FDA under an Emergency Use Authorization (EUA).  This EUA will remain in effect (meaning this test can be used) for the duration of the COVID19 d eclaration under Section 564(b)(1) of the Act, 21 U.S.C. section [phone] 3(b)(1), unless the authorization is terminated or revoked sooner. Performed at [facility], [address] [place], [place] [address]   MRSA PCR Screening     Status: None   Collection Time: 08/19/18  4:09 PM  Result Value Ref Range Status   MRSA by PCR NEGATIVE NEGATIVE Final    Comment:        The GeneXpert MRSA Assay (FDA approved for NASAL specimens only), is one component of a comprehensive MRSA colonization surveillance program. It is not intended to diagnose MRSA infection nor to  guide or monitor treatment for MRSA infections. Performed at [facility],  [place] [address], [place], [place] [address]   SARS Coronavirus 2 [facility] order, Performed in [facility] hospital lab)     Status: None   Collection Time: 08/20/18  2:44 PM  Result Value Ref Range Status   SARS Coronavirus 2 NEGATIVE NEGATIVE Final    Comment: (NOTE) If result is NEGATIVE SARS-CoV-2 target nucleic acids are NOT DETECTED. The SARS-CoV-2 RNA is generally detectable in upper and lower  respiratory specimens during the acute phase of infection. The lowest  concentration of SARS-CoV-2 viral copies this assay can detect is 250  copies / mL. A negative result does not preclude SARS-CoV-2 infection  and should not be used as the sole basis for treatment or other  patient management decisions.  A negative result may occur with  improper specimen collection / handling, submission of specimen other  than nasopharyngeal swab, presence of viral mutation(s) within the  areas targeted by this assay, and inadequate number of viral copies  (<250 copies / mL). A negative result must be combined with clinical  observations, patient history, and epidemiological information. If result is POSITIVE SARS-CoV-2 target nucleic acids are DETECTED. The SARS-CoV-2 RNA is generally detectable in upper and lower  respiratory specimens dur ing the acute phase of infection.  Positive  results are indicative of active infection with SARS-CoV-2.  Clinical  correlation with patient history and other diagnostic information is  necessary to determine patient infection status.  Positive results do  not rule out bacterial infection or co-infection with other viruses. If result is PRESUMPTIVE POSTIVE SARS-CoV-2 nucleic acids MAY BE PRESENT.   A presumptive positive result was obtained on the submitted specimen  and confirmed on repeat testing.  While 2019 novel coronavirus  (SARS-CoV-2) nucleic acids may be present in the submitted sample  additional confirmatory testing may be necessary for  epidemiological  and / or clinical management purposes  to differentiate between  SARS-CoV-2 and other Sarbecovirus currently known to infect humans.  If clinically indicated additional testing with an alternate test  methodology [phone]) is advised. The SARS-CoV-2 RNA is generally  detectable in upper and lower respiratory sp ecimens during the acute  phase of infection. The expected result is Negative. Fact Sheet for Patients:  StrictlyIdeas.no Fact Sheet for Healthcare Providers: BankingDealers.co.za This test is not yet approved or cleared by the [place] FDA and has been authorized for detection and/or diagnosis of SARS-CoV-2 by FDA under an Emergency Use Authorization (EUA).  This EUA will remain in effect (meaning this test can be used) for the duration of the COVID-19 declaration under Section 564(b)(1) of the Act, 21 U.S.C. section 360bbb-3(b)(1), unless the authorization is terminated or revoked sooner. Performed at [facility], [place] [address], [place], [place] [address]   Culture, blood (Routine X 2) w Reflex to ID Panel     Status: None (Preliminary result)   Collection Time: 08/20/18  3:40 PM  Result Value Ref Range Status   Specimen Description   Final    BLOOD LEFT HAND Performed at [facility] [address] [place], [place] [address]    Special Requests   Final    AEB BCHV Performed at [facility], [place] [address], [place], [place] [address]    Culture   Final    NO GROWTH < 24 HOURS Performed at [facility] [address], [place], [place] [address]  Report Status PENDING  Incomplete  Culture, blood (Routine X 2) w Reflex to ID Panel     Status: None (Preliminary result)   Collection Time: 08/20/18  3:40 PM  Result Value Ref Range Status   Specimen Description   Final    BLOOD LEFT ARM Performed at [facility] [address] [place], [place] [address]    Special Requests   Final    AEB BCAV Performed at [facility] [address], [place], [place] [address]    Culture   Final    NO GROWTH < 24 HOURS Performed at [facility] [address] [place], [place] [address]    Report Status PENDING  Incomplete  Respiratory Panel by PCR     Status: None   Collection Time: 08/20/18  3:57 PM  Result Value Ref Range Status   Adenovirus NOT DETECTED NOT DETECTED Final   Coronavirus 229E NOT DETECTED NOT DETECTED Final    Comment: (NOTE) The Coronavirus on the Respiratory Panel, DOES NOT test for the novel  Coronavirus (2019 nCoV)    Coronavirus HKU1 NOT DETECTED NOT DETECTED Final   Coronavirus NL63 NOT DETECTED NOT DETECTED Final   Coronavirus OC43 NOT DETECTED NOT DETECTED Final   Metapneumovirus NOT DETECTED NOT DETECTED Final   Rhinovirus / Enterovirus NOT DETECTED NOT DETECTED Final   Influenza A NOT DETECTED NOT DETECTED Final   Influenza B NOT DETECTED NOT DETECTED Final   Parainfluenza Virus 1 NOT DETECTED NOT DETECTED Final   Parainfluenza Virus 2 NOT DETECTED NOT DETECTED Final   Parainfluenza Virus 3 NOT DETECTED NOT DETECTED Final   Parainfluenza Virus 4 NOT DETECTED NOT DETECTED Final   Respiratory Syncytial Virus NOT DETECTED NOT DETECTED Final   Bordetella pertussis NOT DETECTED NOT DETECTED Final   Chlamydophila pneumoniae NOT DETECTED NOT DETECTED Final   Mycoplasma pneumoniae NOT DETECTED NOT DETECTED Final    Comment: Performed at [facility], [address], [place], [place] [address]     Time coordinating discharge: 25 minutes      SIGNED:   Edwin Dada, MD  [facility] 08/21/2018, 3:37 PM
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
## 2018-08-22 ENCOUNTER — Other Ambulatory Visit: Payer: Self-pay

## 2018-08-22 ENCOUNTER — Ambulatory Visit: Payer: Medicare Other | Admitting: Nurse Practitioner

## 2018-08-22 LAB — LEGIONELLA PNEUMOPHILA SEROGP 1 UR AG: L. pneumophila Serogp 1 Ur Ag: NEGATIVE

## 2018-08-22 LAB — INTERLEUKIN-6, PLASMA: Interleukin-6, Plasma: 6.4 pg/mL (ref 0.0–12.2)

## 2018-08-23 ENCOUNTER — Ambulatory Visit (INDEPENDENT_AMBULATORY_CARE_PROVIDER_SITE_OTHER): Payer: Medicare Other | Admitting: Family

## 2018-08-23 ENCOUNTER — Encounter: Payer: Self-pay | Admitting: Family

## 2018-08-23 ENCOUNTER — Other Ambulatory Visit: Payer: Self-pay

## 2018-08-23 VITALS — BP 174/91 | HR 94 | Temp 98.1°F | Ht 61.0 in | Wt 133.4 lb

## 2018-08-23 DIAGNOSIS — F315 Bipolar disorder, current episode depressed, severe, with psychotic features: Secondary | ICD-10-CM | POA: Diagnosis not present

## 2018-08-23 DIAGNOSIS — Z79899 Other long term (current) drug therapy: Secondary | ICD-10-CM | POA: Diagnosis not present

## 2018-08-23 DIAGNOSIS — F411 Generalized anxiety disorder: Secondary | ICD-10-CM

## 2018-08-23 DIAGNOSIS — G894 Chronic pain syndrome: Secondary | ICD-10-CM | POA: Diagnosis not present

## 2018-08-23 DIAGNOSIS — N12 Tubulo-interstitial nephritis, not specified as acute or chronic: Secondary | ICD-10-CM

## 2018-08-23 DIAGNOSIS — R309 Painful micturition, unspecified: Secondary | ICD-10-CM | POA: Diagnosis not present

## 2018-08-23 DIAGNOSIS — Z720 Tobacco use: Secondary | ICD-10-CM

## 2018-08-23 DIAGNOSIS — Z09 Encounter for follow-up examination after completed treatment for conditions other than malignant neoplasm: Secondary | ICD-10-CM

## 2018-08-23 LAB — URINALYSIS, COMPLETE
Bilirubin, UA: NEGATIVE
Glucose, UA: NEGATIVE
Ketones, UA: NEGATIVE
Leukocytes,UA: NEGATIVE
Nitrite, UA: NEGATIVE
Specific Gravity, UA: 1.015 (ref 1.005–1.030)
Urobilinogen, Ur: 0.2 mg/dL (ref 0.2–1.0)
pH, UA: 7 (ref 5.0–7.5)

## 2018-08-23 LAB — MICROSCOPIC EXAMINATION
Bacteria, UA: NONE SEEN
Renal Epithel, UA: NONE SEEN /hpf

## 2018-08-23 MED ORDER — FLUCONAZOLE 150 MG PO TABS: 150.0000 mg | ORAL_TABLET | ORAL | 0 refills | Status: DC | PRN

## 2018-08-23 MED ORDER — CIPROFLOXACIN HCL 500 MG PO TABS: 500.0000 mg | ORAL_TABLET | Freq: Two times a day (BID) | ORAL | 0 refills | Status: DC

## 2018-08-23 NOTE — Patient Instructions (Signed)
Opioid Overdose Opioids are substances that relieve pain by binding to pain receptors in your brain and spinal cord. Opioids include illegal drugs, such as heroin, as well as prescription pain medicines.An opioid overdose happens when you take too much of an opioid substance. This can happen with any type of opioid, including:  Heroin.  Morphine.  Codeine.  Methadone.  Oxycodone.  Hydrocodone.  Fentanyl.  Hydromorphone.  Buprenorphine. The effects of an overdose can be mild, dangerous, or even deadly. Opioid overdose is a medical emergency. What are the causes? This condition may be caused by:  Taking too much of an opioid by accident.  Taking too much of an opioid on purpose.  An error made by a health care provider who prescribes a medicine.  An error made by the pharmacist who fills the prescription order.  Using more than one substance that contains opioids at the same time.  Mixing an opioid with a substance that affects your heart, breathing, or blood pressure. These include alcohol, tranquilizers, sleeping pills, illegal drugs, and some over-the-counter medicines. What increases the risk? This condition is more likely in:  Children. They may be attracted to colorful pills. Because of a child's small size, even a small amount of a drug can be dangerous.  Elderly people. They may be taking many different drugs. Elderly people may have difficulty reading labels or remembering when they last took their medicine.  People who take an opioid on a long-term basis.  People who use: ? Illegal drugs. ? Other substances, including alcohol, while using an opioid.  People who have: ? A history of drug or alcohol abuse. ? Certain mental health conditions.  People who take opioids that are not prescribed for them. What are the signs or symptoms? Symptoms of this condition depend on the type of opioid and the amount that was taken. Common symptoms include:  Sleepiness  or difficulty waking from sleep.  Confusion.  Slurred speech.  Slowed breathing and a slow pulse.  Nausea and vomiting.  Abnormally small pupils. Signs and symptoms that require emergency treatment include:  Cold, clammy, and pale skin.  Blue lips and fingernails.  Vomiting.  Gurgling sounds in the throat.  A pulse that is very slow or difficult to detect.  Breathing that is very slow, noisy, or difficult to detect.  Limp body.  Inability to respond to speech or be awakened from sleep (stupor). How is this diagnosed? This condition is diagnosed based on your symptoms. It is important to tell your health care provider:  All of the opioidsthat you took.  When you took the opioids.  Whether you were drinking alcohol or using other substances. Your health care provider will do a physical exam. This exam may include:  Checking and monitoring your heart rate and rhythm, your breathing rate and depth, your temperature, and your blood pressure (vital signs).  Checking for abnormally small pupils.  Measuring oxygen levels in your blood. You may also have blood tests or urine tests. How is this treated? Supporting your vital signs and your breathing is the first step in treating an opioid overdose. Treatment may also include:  Giving fluids and minerals (electrolytes) through an IV tube.  Inserting a breathing tube (endotracheal tube) in your airway to help you breathe.  Giving oxygen.  Passing a tube through your nose and into your stomach (NG tube, or nasogastric tube) to wash out your stomach.  Giving medicines that: ? Increase your blood pressure. ? Absorb any opioid that  is in your digestive system. ? Reverse the effects of the opioid (naloxone).  Ongoing counseling and mental health support if you intentionally overdosed or used an illegal drug. Follow these instructions at home:   Take over-the-counter and prescription medicines only as told by your  health care provider. Always ask your health care provider about possible side effects and interactions of any new medicine that you start taking.  Keep a list of all of the medicines that you take, including over-the-counter medicines. Bring this list with you to all of your medical visits.  Drink enough fluid to keep your urine clear or pale yellow.  Keep all follow-up visits as told by your health care provider. This is important. How is this prevented?  Get help if you are struggling with: ? Alcohol or drug use. ? Depression or another mental health problem.  Keep the phone number of your local poison control center near your phone or on your cell phone.  Store all medicines in safety containers that are out of the reach of children.  Read the drug inserts that come with your medicines.  Do not drink alcohol when taking opioids.  Do not use illegal drugs.  Do not take opioid medicines that are not prescribed for you. Contact a health care provider if:  Your symptoms return.  You develop new symptoms or side effects when you are taking medicines. Get help right away if:  You think that you or someone else may have taken too much of an opioid. The hotline of the [facility] is ([phone] [phone].  You or someone else is having symptoms of an opioid overdose.  You have serious thoughts about hurting yourself or others.  You have: ? Chest pain. ? Difficulty breathing. ? A loss of consciousness. Opioid overdose is an emergency. Do not wait to see if the symptoms will go away. Get medical help right away. Call your local emergency services (911 in the [place] Do not drive yourself to the hospital. This information is not intended to replace advice given to you by your health care provider. Make sure you discuss any questions you have with your health care provider. Document Released: 04/27/2004 Document Revised: 10/05/2016 Document Reviewed: 09/03/2014 Elsevier  Interactive Patient Education  2019 [employer].

## 2018-08-23 NOTE — Progress Notes (Signed)
Subjective:    Patient ID: [name], female    DOB: 03-13-1976, 43 y.o.   MRN: [id number]  Chief Complaint  Patient presents with  . pain passing urine  . check oxygen level   PT presents to the office today for hospital follow up. She went to the ED on 08/21/18 with hypotension, acute weakness, and 75% O2 on RA. She was given IV fluids and  Narcan and BP improved and oxygen improved.   She is currently taking Xanax 0.5 mg BID, baclofen 10 mg, Percocet 10-325 mg , and Seroquel 200 mg BID.   Pt is followed by Pain Clinic once a month for chronic pain.  Dysuria   This is a new problem. The current episode started yesterday. The problem occurs intermittently. The problem has been waxing and waning. The quality of the pain is described as burning. The pain is moderate. There has been no fever. Associated symptoms include flank pain, frequency, hematuria, nausea and urgency. She has tried increased fluids for the symptoms. The treatment provided mild relief.      Review of Systems  Gastrointestinal: Positive for nausea.  Genitourinary: Positive for dysuria, flank pain, frequency, hematuria and urgency.  All other systems reviewed and are negative.      Objective:   Physical Exam Vitals signs reviewed.  Constitutional:      General: She is not in acute distress.    Appearance: She is well-developed.  HENT:     Head: Normocephalic and atraumatic.     Right Ear: External ear normal.  Eyes:     Pupils: Pupils are equal, round, and reactive to light.  Neck:     Musculoskeletal: Normal range of motion and neck supple.     Thyroid: No thyromegaly.  Cardiovascular:     Rate and Rhythm: Normal rate and regular rhythm.     Heart sounds: Normal heart sounds. No murmur.  Pulmonary:     Effort: Pulmonary effort is normal. No respiratory distress.     Breath sounds: Normal breath sounds. No wheezing.  Abdominal:     General: Bowel sounds are normal. There is no distension.   Palpations: Abdomen is soft.     Tenderness: There is abdominal tenderness. There is right CVA tenderness, left CVA tenderness and guarding.  Musculoskeletal: Normal range of motion.        General: No tenderness.  Skin:    General: Skin is warm and dry.  Neurological:     Mental Status: She is alert and oriented to person, place, and time.     Cranial Nerves: No cranial nerve deficit.     Deep Tendon Reflexes: Reflexes are normal and symmetric.  Psychiatric:        Behavior: Behavior normal.        Thought Content: Thought content normal.        Judgment: Judgment normal.       BP (!) 174/91   Pulse 94   Temp 98.1 F (36.7 C) (Oral)   Ht '5\' 1"'$  (1.549 m)   Wt 133 lb 6.4 oz (60.5 kg)   SpO2 95% Comment: sitting , room air  BMI 25.21 kg/m      Assessment & Plan:  [name] comes in today with chief complaint of pain passing urine and check oxygen level   Diagnosis and orders addressed:  1. Pain passing urine - Urinalysis, Complete - Urine Culture - CMP14+EGFR - CBC with Differential/Platelet  2. Bipolar disorder, current episode  depressed, severe, with psychotic features ([place]) - CMP14+EGFR - CBC with Differential/Platelet  3. Polypharmacy - CMP14+EGFR - CBC with Differential/Platelet  4. Chronic pain syndrome - CMP14+EGFR - CBC with Differential/Platelet  5. Tobacco abuse - CMP14+EGFR - CBC with Differential/Platelet  6. GAD (generalized anxiety disorder) - CMP14+EGFR - CBC with Differential/Platelet  7. Hospital discharge follow-up - CMP14+EGFR - CBC with Differential/Platelet  8. Pyelonephritis - Urine Culture - CMP14+EGFR - CBC with Differential/Platelet - ciprofloxacin (CIPRO) 500 MG tablet; Take 1 tablet (500 mg total) by mouth 2 (two) times daily.  Dispense: 14 tablet; Refill: 0 - Microscopic Examination  Long discussion with patient that her PCP will start to tamper her xanax down. She states she needs her xanax and pain  medication. Discussed in length the possibility of respiratory distress with taking these combined.  She states she understands the risks, but states this is not what is causing her "lung problems".  She states she has had problems with her lungs before taking these medications. I stated that this is more of a reason not to take together.   Her oxygen walking was 96% and does not qualify for home oxygen. She will follow up with PCP in two weeks and may need spirometry or referral to Pulmonologist.  Pt reviewed in [place] controlled database  Evelina Dun, FNP

## 2018-08-24 LAB — CULTURE, BLOOD (ROUTINE X 2)
Culture: NO GROWTH
Culture: NO GROWTH
Special Requests: ADEQUATE

## 2018-08-25 LAB — URINE CULTURE: Organism ID, Bacteria: NO GROWTH

## 2018-08-25 LAB — CULTURE, BLOOD (ROUTINE X 2)
Culture: NO GROWTH
Culture: NO GROWTH

## 2018-09-03 ENCOUNTER — Ambulatory Visit: Payer: Medicare Other | Admitting: *Deleted

## 2018-09-03 ENCOUNTER — Other Ambulatory Visit: Payer: Self-pay

## 2018-09-03 VITALS — BP 109/74 | HR 88

## 2018-09-03 DIAGNOSIS — Z0130 Encounter for examination of blood pressure without abnormal findings: Secondary | ICD-10-CM

## 2018-09-03 MED ORDER — BD ASSURE BPM/AUTO ARM CUFF MISC: 1.0000 | Freq: Every day | 0 refills | Status: DC

## 2018-09-03 NOTE — Progress Notes (Signed)
Pt here for BP check BP 109 74 P 88 Pt also requesting RX for BP cuff RX sent to [facility] per MMM

## 2018-09-06 ENCOUNTER — Encounter: Payer: Medicare Other | Admitting: Nurse Practitioner

## 2018-09-06 ENCOUNTER — Other Ambulatory Visit: Payer: Self-pay

## 2018-09-06 NOTE — Progress Notes (Signed)
   I attempted to call patient at [phone] 5x from 3:30 to 4:30. I was not able to get an answer.

## 2018-09-09 ENCOUNTER — Encounter: Payer: Self-pay | Admitting: Nurse Practitioner

## 2018-09-09 ENCOUNTER — Other Ambulatory Visit: Payer: Self-pay

## 2018-09-09 ENCOUNTER — Ambulatory Visit (INDEPENDENT_AMBULATORY_CARE_PROVIDER_SITE_OTHER): Payer: Medicare Other | Admitting: Nurse Practitioner

## 2018-09-09 DIAGNOSIS — F411 Generalized anxiety disorder: Secondary | ICD-10-CM | POA: Diagnosis not present

## 2018-09-09 NOTE — Progress Notes (Signed)
   Virtual Visit via telephone Note  I connected with [name] on 09/09/18 at 1:00 PM by telephone and verified that I am speaking with the correct person using two identifiers. [name] is currently located at home and her daughter is currently with her during visit. The provider, [name], FNP is located in their office at time of visit.  I discussed the limitations, risks, security and privacy concerns of performing an evaluation and management service by telephone and the availability of in person appointments. I also discussed with the patient that there may be a patient responsible charge related to this service. The patient expressed understanding and agreed to proceed.   History and Present Illness:   Chief Complaint: Anxiety   HPI Patient has been having slight dizzy spells if she gets up to fast. She has been using her moms oxygen at night. She went to ER and was diagnosed with overdose and had to be given narcan. She says that ain management changed her pain meds and that is what made her react. They changed her back to hydrocodone and she is doing better.   Review of Systems  Constitutional: Negative.   Respiratory: Negative.   Cardiovascular: Negative.   Musculoskeletal: Negative.   Psychiatric/Behavioral: The patient is nervous/anxious.   All other systems reviewed and are negative.    Observations/Objective: Alert and oriented- answers all questions apropriately No distress talkative  Assessment and Plan: [name] in today with chief complaint of Anxiety   1. GAD (generalized anxiety disorder) Stress managementment Do take meds as rx.  Keep xanax 1mg  1/2 tablet BID   Follow Up Instructions: 3 months    I discussed the assessment and treatment plan with the patient. The patient was provided an opportunity to ask questions and all were answered. The patient agreed with the plan and demonstrated an understanding of the  instructions.   The patient was advised to call back or seek an in-person evaluation if the symptoms worsen or if the condition fails to improve as anticipated.  The above assessment and management plan was discussed with the patient. The patient verbalized understanding of and has agreed to the management plan. Patient is aware to call the clinic if symptoms persist or worsen. Patient is aware when to return to the clinic for a follow-up visit. Patient educated on when it is appropriate to go to the emergency department.   Time call ended:  1:15  I provided 15 minutes of non-face-to-face time during this encounter.    [name], FNP

## 2018-09-13 ENCOUNTER — Other Ambulatory Visit: Payer: Self-pay | Admitting: Nurse Practitioner

## 2018-09-16 ENCOUNTER — Other Ambulatory Visit: Payer: Self-pay | Admitting: Nurse Practitioner

## 2018-09-20 ENCOUNTER — Telehealth: Payer: Self-pay

## 2018-09-20 ENCOUNTER — Other Ambulatory Visit: Payer: Self-pay | Admitting: Nurse Practitioner

## 2018-09-20 MED ORDER — ALPRAZOLAM 0.5 MG PO TABS: 0.5000 mg | ORAL_TABLET | Freq: Two times a day (BID) | ORAL | 0 refills | Status: DC | PRN

## 2018-09-20 NOTE — Telephone Encounter (Signed)
Patient of MMM. Patient came in to office requesting that her xanax be refilled. According to [facility] telephone visit on 6/8 she wanted her to continue with the xanax but patient does not have any refills. Please review and advise on RF

## 2018-09-20 NOTE — Telephone Encounter (Signed)
She will have to wait until Monday

## 2018-09-20 NOTE — Telephone Encounter (Signed)
Pt aware and routed this to MMM.

## 2018-09-26 NOTE — Telephone Encounter (Signed)
Rx taken care of by PCP

## 2018-09-27 ENCOUNTER — Encounter: Payer: Self-pay | Admitting: Family Medicine

## 2018-09-27 ENCOUNTER — Other Ambulatory Visit: Payer: Self-pay

## 2018-09-27 ENCOUNTER — Ambulatory Visit (INDEPENDENT_AMBULATORY_CARE_PROVIDER_SITE_OTHER): Payer: Medicare Other | Admitting: Family Medicine

## 2018-09-27 DIAGNOSIS — B9789 Other viral agents as the cause of diseases classified elsewhere: Secondary | ICD-10-CM | POA: Diagnosis not present

## 2018-09-27 DIAGNOSIS — J329 Chronic sinusitis, unspecified: Secondary | ICD-10-CM | POA: Diagnosis not present

## 2018-09-27 MED ORDER — FLUTICASONE PROPIONATE 50 MCG/ACT NA SUSP: 2.0000 | Freq: Every day | NASAL | 6 refills | Status: DC

## 2018-09-27 NOTE — Progress Notes (Signed)
Virtual Visit via telephone Note Due to COVID-19, visit is conducted virtually and was requested by patient. This visit type was conducted due to national recommendations for restrictions regarding the COVID-19 Pandemic (e.g. social distancing) in an effort to limit this patient's exposure and mitigate transmission in our community. All issues noted in this document were discussed and addressed.  A physical exam was not performed with this format.   I connected with [name] on 09/27/18 at 0755 by telephone and verified that I am speaking with the correct person using two identifiers. [name] is currently located at home and family is currently with them during visit. The provider, Monia Pouch, FNP is located in their office at time of visit.  I discussed the limitations, risks, security and privacy concerns of performing an evaluation and management service by telephone and the availability of in person appointments. I also discussed with the patient that there may be a patient responsible charge related to this service. The patient expressed understanding and agreed to proceed.  Subjective:  Patient ID: [name], female    DOB: [birth year], 43 y.o.   MRN: [id number]  Chief Complaint:  Nasal Congestion   HPI: [name] is a 43 y.o. female presenting on 09/27/2018 for Nasal Congestion   Pt reports one day of postnasal drainage and rhinorrhea. This started yesterday and the drainage is causing her to cough at night. She denies fever, cough during the day, headache, shortness of breath, chest pain, fatigue, weakness, or neck pain. She has tried benadryl with minimal relief of symptoms.     Relevant past medical, surgical, family, and social history reviewed and updated as indicated.  Allergies and medications reviewed and updated.   Past Medical History:  Diagnosis Date  . Anxiety   . Back pain   . IBS (irritable bowel syndrome)     Past Surgical  History:  Procedure Laterality Date  . ABDOMINAL HYSTERECTOMY      Social History   Socioeconomic History  . Marital status: Married    Spouse name: Not on file  . Number of children: Not on file  . Years of education: Not on file  . Highest education level: Not on file  Occupational History  . Not on file  Social Needs  . Financial resource strain: Not on file  . Food insecurity    Worry: Not on file    Inability: Not on file  . Transportation needs    Medical: Not on file    Non-medical: Not on file  Tobacco Use  . Smoking status: Former Smoker    Quit date: 12/02/2013    Years since quitting: 4.8  . Smokeless tobacco: Never Used  Substance and Sexual Activity  . Alcohol use: No  . Drug use: No  . Sexual activity: Yes    Birth control/protection: None  Lifestyle  . Physical activity    Days per week: Not on file    Minutes per session: Not on file  . Stress: Not on file  Relationships  . Social Herbalist on phone: Not on file    Gets together: Not on file    Attends religious service: Not on file    Active member of club or organization: Not on file    Attends meetings of clubs or organizations: Not on file    Relationship status: Not on file  . Intimate partner violence    Fear of current or ex  partner: Not on file    Emotionally abused: Not on file    Physically abused: Not on file    Forced sexual activity: Not on file  Other Topics Concern  . Not on file  Social History Narrative  . Not on file    Outpatient Encounter Medications as of 09/27/2018  Medication Sig  . ALPRAZolam (XANAX) 0.5 MG tablet Take 1 tablet (0.5 mg total) by mouth 2 (two) times daily as needed for anxiety.  . baclofen (LIORESAL) 10 MG tablet Take 0.5 tablets (5 mg total) by mouth 3 (three) times daily.  . Blood Pressure Monitoring (B-D ASSURE BPM/AUTO ARM CUFF) MISC 1 Device by Does not apply route daily.  . Calcium Polycarbophil (FIBER-CAPS PO) Take by mouth.  .  celecoxib (CELEBREX) 200 MG capsule Take 200 mg by mouth daily as needed for pain.  . cetirizine (ZYRTEC) 10 MG tablet Take 1 tablet (10 mg total) by mouth daily.  . ciprofloxacin (CIPRO) 500 MG tablet Take 1 tablet (500 mg total) by mouth 2 (two) times daily.  . fluconazole (DIFLUCAN) 150 MG tablet Take 1 tablet (150 mg total) by mouth every three (3) days as needed.  . fluticasone (FLONASE) 50 MCG/ACT nasal spray Place 2 sprays into both nostrils daily.  Marland Kitchen lisinopril (ZESTRIL) 10 MG tablet Take 1 tablet (10 mg total) by mouth daily.  Marland Kitchen omeprazole (PRILOSEC) 20 MG capsule Take 1 capsule (20 mg total) by mouth daily.  . ondansetron (ZOFRAN) 4 MG tablet Take 1 tablet (4 mg total) by mouth every 8 (eight) hours as needed for nausea or vomiting.  Marland Kitchen oxyCODONE-acetaminophen (PERCOCET) 10-325 MG tablet Take 1 tablet by mouth every 4 (four) hours as needed for pain.  Marland Kitchen PARoxetine (PAXIL) 40 MG tablet Take 1 tablet (40 mg total) by mouth every morning.  . polyethylene glycol (MIRALAX / GLYCOLAX) packet Take 17 g by mouth daily.  . QUEtiapine (SEROQUEL) 400 MG tablet Take 0.5 tablets (200 mg total) by mouth 2 (two) times daily.  . simethicone (MYLICON) 80 MG chewable tablet Chew 1 tablet (80 mg total) by mouth every 6 (six) hours as needed for flatulence.  . [DISCONTINUED] fluticasone (FLONASE) 50 MCG/ACT nasal spray Place 2 sprays into both nostrils daily. (Patient not taking: Reported on 08/23/2018)   No facility-administered encounter medications on file as of 09/27/2018.     Allergies  Allergen Reactions  . Amoxicillin Nausea And Vomiting  . Chantix [Varenicline] Other (See Comments)    Causes bad nightmares, hallucinations  . Codeine Other (See Comments)  . Divalproex Sodium Other (See Comments)    sedation  . Lithium Other (See Comments)    Drowsiness, tremors drowsiness  . Propoxyphene Nausea And Vomiting  . Augmentin [Amoxicillin-Pot Clavulanate] Diarrhea and Other (See Comments)  .  Zolpidem Other (See Comments)    Patient states it does not agree with her body.     Review of Systems  Constitutional: Negative for chills, fatigue and fever.  HENT: Positive for postnasal drip and rhinorrhea. Negative for congestion, sinus pressure, sinus pain, sneezing, sore throat and trouble swallowing.   Respiratory: Positive for cough. Negative for apnea, choking, chest tightness, shortness of breath, wheezing and stridor.   Cardiovascular: Negative for chest pain, palpitations and leg swelling.  Gastrointestinal: Negative for abdominal pain, nausea and vomiting.  Genitourinary: Negative for decreased urine volume and difficulty urinating.  Musculoskeletal: Negative for arthralgias and myalgias.  Skin: Negative for color change and rash.  Neurological: Negative for dizziness, weakness, light-headedness  and headaches.  Psychiatric/Behavioral: Negative for confusion.  All other systems reviewed and are negative.        Observations/Objective: No vital signs or physical exam, this was a telephone or virtual health encounter.  Pt alert and oriented, answers all questions appropriately, and able to speak in full sentences.    Assessment and Plan: Marielys was seen today for nasal congestion.  Diagnoses and all orders for this visit:  Viral sinusitis Reported symptoms consistent with viral sinusitis. No fever, shortness of breath, or fatigue. No known sick contacts. Unlikely bacterial in nature or COVID-19. Symptomatic care discussed. Flonase, Zyrtec, Mucinex or Aleve cold and sinus, frequent saline nasal sprays, increase water intake, tylenol as needed, and rest. Report any new or worsening symptoms. Medications as prescribed.  -     fluticasone (FLONASE) 50 MCG/ACT nasal spray; Place 2 sprays into both nostrils daily.     Follow Up Instructions: Return if symptoms worsen or fail to improve.    I discussed the assessment and treatment plan with the patient. The patient was  provided an opportunity to ask questions and all were answered. The patient agreed with the plan and demonstrated an understanding of the instructions.   The patient was advised to call back or seek an in-person evaluation if the symptoms worsen or if the condition fails to improve as anticipated.  The above assessment and management plan was discussed with the patient. The patient verbalized understanding of and has agreed to the management plan. Patient is aware to call the clinic if symptoms persist or worsen. Patient is aware when to return to the clinic for a follow-up visit. Patient educated on when it is appropriate to go to the emergency department.    I provided 15 minutes of non-face-to-face time during this encounter. The call started at 0755. The call ended at 0810. The other time was used for coordination of care.    Monia Pouch, FNP-C [facility] [address] [place], [place] [address] [phone]

## 2018-10-02 ENCOUNTER — Encounter: Payer: Self-pay | Admitting: Family

## 2018-10-02 ENCOUNTER — Other Ambulatory Visit: Payer: Self-pay

## 2018-10-02 ENCOUNTER — Ambulatory Visit (INDEPENDENT_AMBULATORY_CARE_PROVIDER_SITE_OTHER): Payer: Medicare Other | Admitting: Family

## 2018-10-02 DIAGNOSIS — R399 Unspecified symptoms and signs involving the genitourinary system: Secondary | ICD-10-CM | POA: Diagnosis not present

## 2018-10-02 DIAGNOSIS — H60503 Unspecified acute noninfective otitis externa, bilateral: Secondary | ICD-10-CM | POA: Diagnosis not present

## 2018-10-02 MED ORDER — CIPROFLOXACIN HCL 500 MG PO TABS: 500.0000 mg | ORAL_TABLET | Freq: Two times a day (BID) | ORAL | 0 refills | Status: DC

## 2018-10-02 MED ORDER — CIPROFLOXACIN-DEXAMETHASONE 0.3-0.1 % OT SUSP: 4.0000 [drp] | Freq: Two times a day (BID) | OTIC | 0 refills | Status: DC

## 2018-10-02 MED ORDER — FLUCONAZOLE 150 MG PO TABS: 150.0000 mg | ORAL_TABLET | Freq: Once | ORAL | 0 refills | Status: AC

## 2018-10-02 NOTE — Progress Notes (Signed)
Virtual Visit via telephone Note   Attempted to call patient at 3:25 pm, no answer. No VM.     I connected with [name] on 10/02/18 at 3:44 pm by telephone and verified that I am speaking with the correct person using two identifiers. [name] is currently located at home  and no one  is currently with her during visit. The provider, Evelina Dun, FNP is located in their office at time of visit.  I discussed the limitations, risks, security and privacy concerns of performing an evaluation and management service by telephone and the availability of in person appointments. I also discussed with the patient that there may be a patient responsible charge related to this service. The patient expressed understanding and agreed to proceed.   History and Present Illness:  PT calls the office today with complaints of bilateral ear pain that started while she was at the beach after she got into the ocean. She also reports having UTI symptoms.  Otalgia  There is pain in both ears. This is a new problem. The current episode started in the past 7 days. The problem occurs every few minutes. The problem has been waxing and waning. There has been no fever. The pain is at a severity of 6/10. The pain is moderate. Associated symptoms include headaches and hearing loss. Pertinent negatives include no coughing, ear discharge, rhinorrhea or sore throat. She has tried ear drops and acetaminophen for the symptoms. The treatment provided no relief.  Dysuria  This is a new problem. The current episode started in the past 7 days. The problem occurs every urination. The problem has been waxing and waning. The quality of the pain is described as burning. The pain is at a severity of 6/10. The pain is mild. Associated symptoms include flank pain, frequency, hematuria and urgency. Pertinent negatives include no nausea. She has tried increased fluids for the symptoms. The treatment provided mild relief.       Review of Systems  HENT: Positive for ear pain and hearing loss. Negative for ear discharge, rhinorrhea and sore throat.   Respiratory: Negative for cough.   Gastrointestinal: Negative for nausea.  Genitourinary: Positive for dysuria, flank pain, frequency, hematuria and urgency.  Neurological: Positive for headaches.  All other systems reviewed and are negative.    Observations/Objective: No SOB or distress noted  Assessment and Plan: 1. Acute otitis externa of both ears, unspecified type Keep clean and dry Avoid swimming or getting any water into ear Tylenol or motrin prn for pain Do not stick anything into ear - ciprofloxacin (CIPRO) 500 MG tablet; Take 1 tablet (500 mg total) by mouth 2 (two) times daily.  Dispense: 14 tablet; Refill: 0 - ciprofloxacin-dexamethasone (CIPRODEX) OTIC suspension; Place 4 drops into both ears 2 (two) times daily.  Dispense: 7.5 mL; Refill: 0  2. UTI symptoms Force fluids AZO over the counter X2 days RTO if symptoms worsen or do not improve  - ciprofloxacin (CIPRO) 500 MG tablet; Take 1 tablet (500 mg total) by mouth 2 (two) times daily.  Dispense: 14 tablet; Refill: 0     I discussed the assessment and treatment plan with the patient. The patient was provided an opportunity to ask questions and all were answered. The patient agreed with the plan and demonstrated an understanding of the instructions.   The patient was advised to call back or seek an in-person evaluation if the symptoms worsen or if the condition fails to improve as anticipated.  The above assessment and management plan was discussed with the patient. The patient verbalized understanding of and has agreed to the management plan. Patient is aware to call the clinic if symptoms persist or worsen. Patient is aware when to return to the clinic for a follow-up visit. Patient educated on when it is appropriate to go to the emergency department.   Time call ended:  3:53 pm  I  provided 9 minutes of non-face-to-face time during this encounter.    Evelina Dun, FNP

## 2018-10-08 ENCOUNTER — Encounter: Payer: Self-pay | Admitting: Physician Assistant

## 2018-10-08 ENCOUNTER — Ambulatory Visit (INDEPENDENT_AMBULATORY_CARE_PROVIDER_SITE_OTHER): Payer: Medicare Other | Admitting: Physician Assistant

## 2018-10-08 DIAGNOSIS — B379 Candidiasis, unspecified: Secondary | ICD-10-CM

## 2018-10-08 MED ORDER — NYSTATIN 100000 UNIT/ML MT SUSP: 5.0000 mL | Freq: Four times a day (QID) | OROMUCOSAL | 0 refills | Status: DC

## 2018-10-08 MED ORDER — FLUCONAZOLE 150 MG PO TABS: ORAL_TABLET | ORAL | 0 refills | Status: DC

## 2018-10-08 NOTE — Progress Notes (Signed)
Telephone visit  Subjective: TK:PTWSF infection PCP: Chevis Pretty, FNP KCL:EXNTZGY [name] is a 43 y.o. female calls for telephone consult today. Patient provides verbal consent for consult held via phone.  Patient is identified with 2 separate identifiers.  At this time the entire area is on COVID-19 social distancing and stay home orders are in place.  Patient is of higher risk and therefore we are performing this by a virtual method.  Location of patient: home Location of provider: [facility] Others present for call: no  This patient has had a couple of antibiotic courses over the past few weeks.  And since then she has had significant yeast.  She is tried some over-the-counter medication without improvement.  She has had to take Diflucan in the past.  We will send the medication in and have encouraged her to do her antihistamine for itching.   ROS: Per HPI  Allergies  Allergen Reactions  . Amoxicillin Nausea And Vomiting  . Chantix [Varenicline] Other (See Comments)    Causes bad nightmares, hallucinations  . Codeine Other (See Comments)  . Divalproex Sodium Other (See Comments)    sedation  . Lithium Other (See Comments)    Drowsiness, tremors drowsiness  . Propoxyphene Nausea And Vomiting  . Augmentin [Amoxicillin-Pot Clavulanate] Diarrhea and Other (See Comments)  . Zolpidem Other (See Comments)    Patient states it does not agree with her body.    Past Medical History:  Diagnosis Date  . Anxiety   . Back pain   . IBS (irritable bowel syndrome)     Current Outpatient Medications:  .  ALPRAZolam (XANAX) 0.5 MG tablet, Take 1 tablet (0.5 mg total) by mouth 2 (two) times daily as needed for anxiety., Disp: 60 tablet, Rfl: 0 .  baclofen (LIORESAL) 10 MG tablet, Take 0.5 tablets (5 mg total) by mouth 3 (three) times daily., Disp: 30 each, Rfl: 0 .  Blood Pressure Monitoring (B-D ASSURE BPM/AUTO ARM CUFF) MISC, 1 Device by Does not apply route daily., Disp: 1  each, Rfl: 0 .  Calcium Polycarbophil (FIBER-CAPS PO), Take by mouth., Disp: , Rfl:  .  celecoxib (CELEBREX) 200 MG capsule, Take 200 mg by mouth daily as needed for pain., Disp: , Rfl:  .  cetirizine (ZYRTEC) 10 MG tablet, Take 1 tablet (10 mg total) by mouth daily., Disp: 30 tablet, Rfl: 11 .  ciprofloxacin (CIPRO) 500 MG tablet, Take 1 tablet (500 mg total) by mouth 2 (two) times daily., Disp: 14 tablet, Rfl: 0 .  ciprofloxacin-dexamethasone (CIPRODEX) OTIC suspension, Place 4 drops into both ears 2 (two) times daily., Disp: 7.5 mL, Rfl: 0 .  fluconazole (DIFLUCAN) 150 MG tablet, 1 po q week x 4 weeks, Disp: 4 tablet, Rfl: 0 .  fluticasone (FLONASE) 50 MCG/ACT nasal spray, Place 2 sprays into both nostrils daily., Disp: 16 g, Rfl: 6 .  lisinopril (ZESTRIL) 10 MG tablet, Take 1 tablet (10 mg total) by mouth daily., Disp: 90 tablet, Rfl: 0 .  omeprazole (PRILOSEC) 20 MG capsule, Take 1 capsule (20 mg total) by mouth daily., Disp: 30 capsule, Rfl: 3 .  ondansetron (ZOFRAN) 4 MG tablet, Take 1 tablet (4 mg total) by mouth every 8 (eight) hours as needed for nausea or vomiting., Disp: 20 tablet, Rfl: 0 .  oxyCODONE-acetaminophen (PERCOCET) 10-325 MG tablet, Take 1 tablet by mouth every 4 (four) hours as needed for pain., Disp: , Rfl:  .  PARoxetine (PAXIL) 40 MG tablet, Take 1 tablet (40  mg total) by mouth every morning., Disp: 90 tablet, Rfl: 1 .  polyethylene glycol (MIRALAX / GLYCOLAX) packet, Take 17 g by mouth daily., Disp: 14 each, Rfl: 0 .  QUEtiapine (SEROQUEL) 400 MG tablet, Take 0.5 tablets (200 mg total) by mouth 2 (two) times daily., Disp: 180 tablet, Rfl: 0 .  simethicone (MYLICON) 80 MG chewable tablet, Chew 1 tablet (80 mg total) by mouth every 6 (six) hours as needed for flatulence., Disp: 30 tablet, Rfl: 0  Assessment/ Plan: 43 y.o. female   1. Yeast infection - fluconazole (DIFLUCAN) 150 MG tablet; 1 po q week x 4 weeks  Dispense: 4 tablet; Refill: 0   No follow-ups on file.   Continue all other maintenance medications as listed above.  Start time: 10:38 AM End time: 10:44 AM  Meds ordered this encounter  Medications  . fluconazole (DIFLUCAN) 150 MG tablet    Sig: 1 po q week x 4 weeks    Dispense:  4 tablet    Refill:  0    Order Specific Question:   Supervising Provider    Answer:   Janora Norlander [[id number]]  . DISCONTD: nystatin (MYCOSTATIN) 100000 UNIT/ML suspension    Sig: Take 5 mLs (500,000 Units total) by mouth 4 (four) times daily.    Dispense:  60 mL    Refill:  0    Order Specific Question:   Supervising Provider    Answer:   Janora Norlander [[id number]]    Particia Nearing PA-C [facility] [phone]

## 2018-10-09 ENCOUNTER — Other Ambulatory Visit: Payer: Self-pay

## 2018-10-10 ENCOUNTER — Ambulatory Visit (INDEPENDENT_AMBULATORY_CARE_PROVIDER_SITE_OTHER): Payer: Medicare Other | Admitting: Nurse Practitioner

## 2018-10-10 ENCOUNTER — Encounter: Payer: Self-pay | Admitting: Nurse Practitioner

## 2018-10-10 VITALS — BP 99/76 | HR 116 | Temp 97.5°F | Ht 61.0 in | Wt 123.0 lb

## 2018-10-10 DIAGNOSIS — G894 Chronic pain syndrome: Secondary | ICD-10-CM | POA: Diagnosis not present

## 2018-10-10 DIAGNOSIS — F411 Generalized anxiety disorder: Secondary | ICD-10-CM | POA: Diagnosis not present

## 2018-10-10 DIAGNOSIS — K2100 Gastro-esophageal reflux disease with esophagitis, without bleeding: Secondary | ICD-10-CM

## 2018-10-10 DIAGNOSIS — F315 Bipolar disorder, current episode depressed, severe, with psychotic features: Secondary | ICD-10-CM | POA: Diagnosis not present

## 2018-10-10 DIAGNOSIS — Z720 Tobacco use: Secondary | ICD-10-CM

## 2018-10-10 DIAGNOSIS — I10 Essential (primary) hypertension: Secondary | ICD-10-CM

## 2018-10-10 DIAGNOSIS — R350 Frequency of micturition: Secondary | ICD-10-CM

## 2018-10-10 LAB — URINALYSIS, COMPLETE
Bilirubin, UA: NEGATIVE
Glucose, UA: NEGATIVE
Ketones, UA: NEGATIVE
Leukocytes,UA: NEGATIVE
Nitrite, UA: NEGATIVE
Protein,UA: NEGATIVE
RBC, UA: NEGATIVE
Specific Gravity, UA: 1.015 (ref 1.005–1.030)
Urobilinogen, Ur: 0.2 mg/dL (ref 0.2–1.0)
pH, UA: 5.5 (ref 5.0–7.5)

## 2018-10-10 MED ORDER — QUETIAPINE FUMARATE 400 MG PO TABS: 200.0000 mg | ORAL_TABLET | Freq: Two times a day (BID) | ORAL | 0 refills | Status: DC

## 2018-10-10 MED ORDER — PAROXETINE HCL 40 MG PO TABS: 40.0000 mg | ORAL_TABLET | ORAL | 1 refills | Status: DC

## 2018-10-10 MED ORDER — OMEPRAZOLE 20 MG PO CPDR: 20.0000 mg | DELAYED_RELEASE_CAPSULE | Freq: Every day | ORAL | 1 refills | Status: DC

## 2018-10-10 MED ORDER — LISINOPRIL 10 MG PO TABS: 10.0000 mg | ORAL_TABLET | Freq: Every day | ORAL | 1 refills | Status: DC

## 2018-10-10 MED ORDER — ALPRAZOLAM 0.5 MG PO TABS: 0.5000 mg | ORAL_TABLET | Freq: Two times a day (BID) | ORAL | 2 refills | Status: DC | PRN

## 2018-10-10 MED ORDER — QUETIAPINE FUMARATE 400 MG PO TABS: 200.0000 mg | ORAL_TABLET | Freq: Two times a day (BID) | ORAL | 1 refills | Status: DC

## 2018-10-10 NOTE — Progress Notes (Signed)
Subjective:    Patient ID: [name], female    DOB: 1975/06/16, 43 y.o.   MRN: [id number]   Chief Complaint: Medical Management of Chronic Issues    HPI:  1. Bipolar disorder, current episode depressed, severe, with psychotic features ([place]) She is on paxil and seroquel daily and is doing okay. She has occasional mood swings. She is on xanax 0.'5mg'$  bid and cannot function without it.  2. Tobacco abuse Smokes at least 1 pack a day- wants to try to cut back.  3. GAD (generalized anxiety disorder) agioan is on xanax 0.'5mg'$  bid- she was told that she need edto reduce use ue to history of taking to many meds.   4. Chronic pain syndrome Sees pain management. She is on hydrocodone 10/325 qid. She says pain is in neck and lower back.  5. Hypertension She is on lisinopril prn- some times her blood pressure is low. She doe  snot check blood pressure at home.    Outpatient Encounter Medications as of 10/10/2018  Medication Sig  . ALPRAZolam (XANAX) 0.5 MG tablet Take 1 tablet (0.5 mg total) by mouth 2 (two) times daily as needed for anxiety.  . baclofen (LIORESAL) 10 MG tablet Take 0.5 tablets (5 mg total) by mouth 3 (three) times daily.  . Blood Pressure Monitoring (B-D ASSURE BPM/AUTO ARM CUFF) MISC 1 Device by Does not apply route daily.  . Calcium Polycarbophil (FIBER-CAPS PO) Take by mouth.  . cetirizine (ZYRTEC) 10 MG tablet Take 1 tablet (10 mg total) by mouth daily.  . fluconazole (DIFLUCAN) 150 MG tablet 1 po q week x 4 weeks  . fluticasone (FLONASE) 50 MCG/ACT nasal spray Place 2 sprays into both nostrils daily.  Marland Kitchen lisinopril (ZESTRIL) 10 MG tablet Take 1 tablet (10 mg total) by mouth daily.  Marland Kitchen omeprazole (PRILOSEC) 20 MG capsule Take 1 capsule (20 mg total) by mouth daily.  Marland Kitchen PARoxetine (PAXIL) 40 MG tablet Take 1 tablet (40 mg total) by mouth every morning.  . polyethylene glycol (MIRALAX / GLYCOLAX) packet Take 17 g by mouth daily.  . QUEtiapine (SEROQUEL) 400 MG tablet  Take 0.5 tablets (200 mg total) by mouth 2 (two) times daily.  . simethicone (MYLICON) 80 MG chewable tablet Chew 1 tablet (80 mg total) by mouth every 6 (six) hours as needed for flatulence.  . ondansetron (ZOFRAN) 4 MG tablet Take 1 tablet (4 mg total) by mouth every 8 (eight) hours as needed for nausea or vomiting. (Patient not taking: Reported on 10/10/2018)     Past Surgical History:  Procedure Laterality Date  . ABDOMINAL HYSTERECTOMY      Family History  Problem Relation Age of Onset  . Renal Disease Mother   . Heart attack Father     New complaints: wants to make sure her UTI is resolved  Social history: Is on disabilty. Helps keep her grand children  Controlled substance contract: 10/10/18    Review of Systems  Constitutional: Negative for activity change and appetite change.  HENT: Negative.   Eyes: Negative for pain.  Respiratory: Negative for shortness of breath.   Cardiovascular: Negative for chest pain, palpitations and leg swelling.  Gastrointestinal: Negative for abdominal pain.  Endocrine: Negative for polydipsia.  Genitourinary: Positive for frequency and urgency.  Musculoskeletal: Positive for back pain and neck pain.  Skin: Negative for rash.  Neurological: Negative for dizziness, weakness and headaches.  Hematological: Does not bruise/bleed easily.  Psychiatric/Behavioral: Negative.   All other systems reviewed and  are negative.      Objective:   Physical Exam Vitals signs and nursing note reviewed.  Constitutional:      General: She is not in acute distress.    Appearance: Normal appearance. She is well-developed.  HENT:     Head: Normocephalic.     Nose: Nose normal.  Eyes:     Pupils: Pupils are equal, round, and reactive to light.  Neck:     Musculoskeletal: Normal range of motion and neck supple.     Vascular: No carotid bruit or JVD.  Cardiovascular:     Rate and Rhythm: Normal rate and regular rhythm.     Heart sounds: Normal heart  sounds.  Pulmonary:     Effort: Pulmonary effort is normal. No respiratory distress.     Breath sounds: Normal breath sounds. No wheezing or rales.  Chest:     Chest wall: No tenderness.  Abdominal:     General: Bowel sounds are normal. There is no distension or abdominal bruit.     Palpations: Abdomen is soft. There is no hepatomegaly, splenomegaly, mass or pulsatile mass.     Tenderness: There is no abdominal tenderness.  Musculoskeletal: Normal range of motion.  Lymphadenopathy:     Cervical: No cervical adenopathy.  Skin:    General: Skin is warm and dry.  Neurological:     Mental Status: She is alert and oriented to person, place, and time.     Deep Tendon Reflexes: Reflexes are normal and symmetric.  Psychiatric:        Behavior: Behavior normal.        Thought Content: Thought content normal.        Judgment: Judgment normal.    BP 99/76   Pulse (!) 116   Temp (!) 97.5 F (36.4 C) (Oral)   Ht '5\' 1"'$  (1.549 m)   Wt 123 lb (55.8 kg)   BMI 23.24 kg/m         Assessment & Plan:  ALDEA AVIS comes in today with chief complaint of Medical Management of Chronic Issues   Diagnosis and orders addressed:  1. Bipolar disorder, current episode depressed, severe, with psychotic features ([place]) Stress management - QUEtiapine (SEROQUEL) 400 MG tablet; Take 0.5 tablets (200 mg total) by mouth 2 (two) times daily.  Dispense: 180 tablet; Refill: 0  PARoxetine (PAXIL) 40 MG tablet; Take 1 tablet (40 mg total) by mouth every morning.  Dispense: 90 tablet; Refill: 1  2. Tobacco abuse Encouraged to stop smoking  3. GAD (generalized anxiety disorder) Stress management - ALPRAZolam (XANAX) 0.5 MG tablet; Take 1 tablet (0.5 mg total) by mouth 2 (two) times daily as needed for anxiety.  Dispense: 60 tablet; Refill: 2  4. Chronic pain syndrome Keep follow up with pain management - ToxASSURE Select 13 (MW), Urine  5. Essential hypertension Low sodium diet - CMP14+EGFR -  Lipid panel - lisinopril (ZESTRIL) 10 MG tablet; Take 1 tablet (10 mg total) by mouth daily. Dispense: 90 tablet; Refill: 1  6. Frequency of urination Urine clear - Urinalysis, Complete  7. Gastroesophageal reflux disease with esophagitis Avoid spicy foods Do not eat 2 hours prior to bedtime - omeprazole (PRILOSEC) 20 MG capsule; Take 1 capsule (20 mg total) by mouth daily.  Dispense: 90 capsule; Refill: 1  -  Labs pending Health Maintenance reviewed Diet and exercise encouraged  Follow up plan: 3 months   [name], FNP

## 2018-10-13 LAB — TOXASSURE SELECT 13 (MW), URINE

## 2018-10-24 ENCOUNTER — Ambulatory Visit: Payer: Medicare Other | Admitting: Nurse Practitioner

## 2018-10-25 ENCOUNTER — Ambulatory Visit: Payer: Medicare Other | Admitting: Nurse Practitioner

## 2018-11-01 ENCOUNTER — Telehealth: Payer: Self-pay | Admitting: *Deleted

## 2018-11-01 ENCOUNTER — Ambulatory Visit (INDEPENDENT_AMBULATORY_CARE_PROVIDER_SITE_OTHER): Payer: Medicare Other | Admitting: Family Medicine

## 2018-11-01 DIAGNOSIS — Z5329 Procedure and treatment not carried out because of patient's decision for other reasons: Secondary | ICD-10-CM

## 2018-11-01 DIAGNOSIS — Z91199 Patient's noncompliance with other medical treatment and regimen due to unspecified reason: Secondary | ICD-10-CM

## 2018-11-01 MED ORDER — SPINOSAD 0.9 % EX SUSP: CUTANEOUS | 0 refills | Status: DC

## 2018-11-01 NOTE — Progress Notes (Signed)
No show.  Called at [facility], [phone], [phone], [phone], and [phone] without answer.

## 2018-11-01 NOTE — Telephone Encounter (Signed)
[name] needs a bottle of the lice medication Netroba sent in to [facility] for herself

## 2018-11-02 ENCOUNTER — Other Ambulatory Visit: Payer: Self-pay | Admitting: Nurse Practitioner

## 2018-11-02 MED ORDER — NITROFURANTOIN MONOHYD MACRO 100 MG PO CAPS: 100.0000 mg | ORAL_CAPSULE | Freq: Two times a day (BID) | ORAL | 0 refills | Status: DC

## 2018-11-04 ENCOUNTER — Other Ambulatory Visit: Payer: Self-pay | Admitting: Nurse Practitioner

## 2018-11-04 ENCOUNTER — Other Ambulatory Visit: Payer: Self-pay | Admitting: Physician Assistant

## 2018-11-04 ENCOUNTER — Telehealth: Payer: Self-pay | Admitting: Nurse Practitioner

## 2018-11-04 DIAGNOSIS — B379 Candidiasis, unspecified: Secondary | ICD-10-CM

## 2018-11-04 MED ORDER — FLUCONAZOLE 150 MG PO TABS: 150.0000 mg | ORAL_TABLET | Freq: Once | ORAL | 0 refills | Status: AC

## 2018-11-04 NOTE — Telephone Encounter (Signed)
Patient aware.

## 2018-11-04 NOTE — Telephone Encounter (Signed)
I will send oin diflucan- but I am not calling her again

## 2018-11-04 NOTE — Telephone Encounter (Signed)
I just sent her in med for UTI in saturday

## 2018-11-04 NOTE — Telephone Encounter (Signed)
Patient states she now has vaginal itching and vaginal discharge and would like to have diflucan sent to pharmacy

## 2018-11-12 ENCOUNTER — Emergency Department (HOSPITAL_COMMUNITY)
Admission: EM | Admit: 2018-11-12 | Discharge: 2018-11-13 | Disposition: A | Discharge status: Other Acute Inpatient Hospital | Payer: Medicare Other | Source: Home / Self Care | Attending: Emergency Medicine | Admitting: Emergency Medicine

## 2018-11-12 ENCOUNTER — Other Ambulatory Visit: Payer: Self-pay

## 2018-11-12 ENCOUNTER — Encounter (HOSPITAL_COMMUNITY): Payer: Self-pay | Admitting: *Deleted

## 2018-11-12 DIAGNOSIS — F32A Depression, unspecified: Secondary | ICD-10-CM

## 2018-11-12 DIAGNOSIS — F319 Bipolar disorder, unspecified: Secondary | ICD-10-CM | POA: Insufficient documentation

## 2018-11-12 DIAGNOSIS — F418 Other specified anxiety disorders: Secondary | ICD-10-CM | POA: Insufficient documentation

## 2018-11-12 DIAGNOSIS — Z87891 Personal history of nicotine dependence: Secondary | ICD-10-CM | POA: Insufficient documentation

## 2018-11-12 DIAGNOSIS — Z20828 Contact with and (suspected) exposure to other viral communicable diseases: Secondary | ICD-10-CM | POA: Insufficient documentation

## 2018-11-12 DIAGNOSIS — Z79899 Other long term (current) drug therapy: Secondary | ICD-10-CM | POA: Insufficient documentation

## 2018-11-12 DIAGNOSIS — F331 Major depressive disorder, recurrent, moderate: Secondary | ICD-10-CM | POA: Insufficient documentation

## 2018-11-12 DIAGNOSIS — F329 Major depressive disorder, single episode, unspecified: Secondary | ICD-10-CM

## 2018-11-12 LAB — CBC
HCT: 42 % (ref 36.0–46.0)
Hemoglobin: 14 g/dL (ref 12.0–15.0)
MCH: 30.5 pg (ref 26.0–34.0)
MCHC: 33.3 g/dL (ref 30.0–36.0)
MCV: 91.5 fL (ref 80.0–100.0)
Platelets: 261 10*3/uL (ref 150–400)
RBC: 4.59 MIL/uL (ref 3.87–5.11)
RDW: 13 % (ref 11.5–15.5)
WBC: 9.5 10*3/uL (ref 4.0–10.5)
nRBC: 0 % (ref 0.0–0.2)

## 2018-11-12 LAB — COMPREHENSIVE METABOLIC PANEL
ALT: 13 U/L (ref 0–44)
AST: 24 U/L (ref 15–41)
Albumin: 4.1 g/dL (ref 3.5–5.0)
Alkaline Phosphatase: 131 U/L — ABNORMAL HIGH (ref 38–126)
Anion gap: 11 (ref 5–15)
BUN: <5 mg/dL — ABNORMAL LOW (ref 6–20)
CO2: 27 mmol/L (ref 22–32)
Calcium: 9.9 mg/dL (ref 8.9–10.3)
Chloride: 106 mmol/L (ref 98–111)
Creatinine, Ser: 0.7 mg/dL (ref 0.44–1.00)
GFR calc Af Amer: >60 mL/min (ref >60)
GFR calc non Af Amer: >60 mL/min (ref >60)
Glucose, Bld: 121 mg/dL — ABNORMAL HIGH (ref 70–99)
Potassium: 3.9 mmol/L (ref 3.5–5.1)
Sodium: 144 mmol/L (ref 135–145)
Total Bilirubin: 0.3 mg/dL (ref 0.3–1.2)
Total Protein: 7.5 g/dL (ref 6.5–8.1)

## 2018-11-12 LAB — RAPID URINE DRUG SCREEN, HOSP PERFORMED
Amphetamines: NOT DETECTED
Barbiturates: NOT DETECTED
Benzodiazepines: POSITIVE — AB
Cocaine: NOT DETECTED
Opiates: POSITIVE — AB
Tetrahydrocannabinol: NOT DETECTED

## 2018-11-12 LAB — I-STAT BETA HCG BLOOD, ED (MC, WL, AP ONLY): I-stat hCG, quantitative: <5 m[IU]/mL (ref <5)

## 2018-11-12 LAB — ETHANOL: Alcohol, Ethyl (B): <10 mg/dL (ref <10)

## 2018-11-12 MED ORDER — LISINOPRIL 10 MG PO TABS: 10.0000 mg | ORAL_TABLET | Freq: Every day | ORAL | Status: DC | PRN

## 2018-11-12 MED ORDER — PAROXETINE HCL 20 MG PO TABS
40.0000 mg | ORAL_TABLET | ORAL | Status: DC
  Administered 2018-11-13: 08:00:00 40 mg via ORAL
  Filled 2018-11-12: qty 2

## 2018-11-12 MED ORDER — PANTOPRAZOLE SODIUM 40 MG PO TBEC
40.0000 mg | DELAYED_RELEASE_TABLET | Freq: Every day | ORAL | Status: DC
  Administered 2018-11-13: 40 mg via ORAL
  Filled 2018-11-12: qty 1

## 2018-11-12 MED ORDER — HYDROCODONE-ACETAMINOPHEN 10-325 MG PO TABS
1.0000 | ORAL_TABLET | Freq: Four times a day (QID) | ORAL | Status: DC | PRN
  Administered 2018-11-12 – 2018-11-13 (×3): 1 via ORAL
  Filled 2018-11-12 (×3): qty 1

## 2018-11-12 MED ORDER — ALPRAZOLAM 0.5 MG PO TABS
0.5000 mg | ORAL_TABLET | Freq: Two times a day (BID) | ORAL | Status: DC | PRN
  Administered 2018-11-12 – 2018-11-13 (×2): 0.5 mg via ORAL
  Filled 2018-11-12: qty 2
  Filled 2018-11-12: qty 1

## 2018-11-12 MED ORDER — MELATONIN 3 MG PO TABS
3.0000 mg | ORAL_TABLET | Freq: Once | ORAL | Status: AC
  Administered 2018-11-12: 3 mg via ORAL
  Filled 2018-11-12: qty 1

## 2018-11-12 MED ORDER — QUETIAPINE FUMARATE 200 MG PO TABS
200.0000 mg | ORAL_TABLET | Freq: Two times a day (BID) | ORAL | Status: DC
  Filled 2018-11-12: qty 1
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
## 2018-11-12 NOTE — ED Provider Notes (Signed)
[facility] EMERGENCY DEPARTMENT Provider Note   CSN: [id number] Arrival date & time: 11/12/18  1857    History   Chief Complaint Chief Complaint  Patient presents with  . Medical Clearance    HPI [name] is a 43 y.o. female.     43 year old female with a history of bipolar disorder, anxiety, IBS, chronic pain (followed by pain management) presents to the ED for complaints of worsening depression and hallucinations.  She states that her partner continues to tell her that she is crazy. She does not feel like she has smiled or laughed in a while and it is affecting her home life and relationship with her children. Patient also references things being put into her mind, "like telling me how to take a shower". Other "names of people" have been circulating in her memory when they were not people she was initially thinking about. She believes she has a degree of PTSD and her medications need to be changed. Had thoughts of self-injury PTA, but denies SI or HI. Further denies ETOH or drug use.  The history is provided by the patient. No language interpreter was used.    Past Medical History:  Diagnosis Date  . Anxiety   . Back pain   . IBS (irritable bowel syndrome)     Patient Active Problem List   Diagnosis Date Noted  . GAD (generalized anxiety disorder) 08/23/2018  . Hypotension 08/19/2018  . Anemia 08/19/2018  . Hypokalemia 08/19/2018  . Tobacco abuse 08/19/2018  . Polypharmacy 03/05/2016  . Chronic pain 03/05/2016  . Bipolar disorder ([place]) 03/05/2016    Past Surgical History:  Procedure Laterality Date  . ABDOMINAL HYSTERECTOMY       OB History   No obstetric history on file.      Home Medications    Prior to Admission medications   Medication Sig Start Date End Date Taking? Authorizing Provider  ALPRAZolam Duanne Moron) 0.5 MG tablet Take 1 tablet (0.5 mg total) by mouth 2 (two) times daily as needed for anxiety. 10/10/18   Hassell Done,  Mary-Margaret, FNP  baclofen (LIORESAL) 10 MG tablet Take 0.5 tablets (5 mg total) by mouth 3 (three) times daily. 08/20/18   Eugenie Filler, MD  Blood Pressure Monitoring (B-D ASSURE BPM/AUTO ARM CUFF) MISC 1 Device by Does not apply route daily. 09/03/18   Hassell Done, Mary-Margaret, FNP  Calcium Polycarbophil (FIBER-CAPS PO) Take by mouth.    [provider]  cetirizine (ZYRTEC) 10 MG tablet Take 1 tablet (10 mg total) by mouth daily. 04/08/18   Baruch Gouty, FNP  fluconazole (DIFLUCAN) 150 MG tablet 1 po q week x 4 weeks 10/08/18   Terald Sleeper, PA-C  fluticasone [facility]) 50 MCG/ACT nasal spray Place 2 sprays into both nostrils daily. 09/27/18   Baruch Gouty, FNP  lisinopril (ZESTRIL) 10 MG tablet Take 1 tablet (10 mg total) by mouth daily. 10/10/18   Hassell Done, Mary-Margaret, FNP  nitrofurantoin, macrocrystal-monohydrate, (MACROBID) 100 MG capsule Take 1 capsule (100 mg total) by mouth 2 (two) times daily. 1 po BId 11/02/18   Hassell Done, Mary-Margaret, FNP  omeprazole (PRILOSEC) 20 MG capsule Take 1 capsule (20 mg total) by mouth daily. 10/10/18   Hassell Done, Mary-Margaret, FNP  ondansetron (ZOFRAN) 4 MG tablet Take 1 tablet (4 mg total) by mouth every 8 (eight) hours as needed for nausea or vomiting. Patient not taking: Reported on 10/10/2018 04/09/18   Baruch Gouty, FNP  PARoxetine (PAXIL) 40 MG tablet Take 1  tablet (40 mg total) by mouth every morning. 10/10/18   Hassell Done, Mary-Margaret, FNP  polyethylene glycol (MIRALAX / GLYCOLAX) packet Take 17 g by mouth daily. 02/07/16   Long, Wonda Olds, MD  QUEtiapine (SEROQUEL) 400 MG tablet Take 0.5 tablets (200 mg total) by mouth 2 (two) times daily. 10/10/18   Hassell Done Mary-Margaret, FNP  simethicone (MYLICON) 80 MG chewable tablet Chew 1 tablet (80 mg total) by mouth every 6 (six) hours as needed for flatulence. 08/02/18   Sharion Balloon, FNP  Spinosad (NATROBA) 0.9 % SUSP Apply to scalp and leave on 10 minutes and lather and rinse 11/01/18   Chevis Pretty, FNP     Family History Family History  Problem Relation Age of Onset  . Renal Disease Mother   . Heart attack Father     Social History Social History   Tobacco Use  . Smoking status: Former Smoker    Quit date: 12/02/2013    Years since quitting: 4.9  . Smokeless tobacco: Never Used  Substance Use Topics  . Alcohol use: No  . Drug use: No     Allergies   Amoxicillin, Chantix [varenicline], Codeine, Divalproex sodium, Lithium, Propoxyphene, Augmentin [amoxicillin-pot clavulanate], and Zolpidem   Review of Systems Review of Systems Ten systems reviewed and are negative for acute change, except as noted in the HPI.    Physical Exam Updated Vital Signs BP (!) 129/93 (BP Location: Right Arm)   Pulse (!) 109   Temp 99.1 F (37.3 C) (Oral)   Resp 18   SpO2 100%   Physical Exam Vitals signs and nursing note reviewed.  Constitutional:      General: She is not in acute distress.    Appearance: She is well-developed. She is not diaphoretic.     Comments: Nontoxic appearing and in NAD  HENT:     Head: Normocephalic and atraumatic.  Eyes:     General: No scleral icterus.    Conjunctiva/sclera: Conjunctivae normal.  Neck:     Musculoskeletal: Normal range of motion.  Pulmonary:     Effort: Pulmonary effort is normal. No respiratory distress.     Comments: Respirations even and unlabored Musculoskeletal: Normal range of motion.  Skin:    General: Skin is warm and dry.     Coloration: Skin is not pale.     Findings: No erythema or rash.  Neurological:     Mental Status: She is alert and oriented to person, place, and time.     Coordination: Coordination normal.  Psychiatric:        Mood and Affect: Mood is depressed. Affect is tearful (intermittent).        Speech: Speech is tangential.        Behavior: Behavior is agitated (mild). Behavior is cooperative.      ED Treatments / Results  Labs (all labs ordered are listed, but only abnormal results are displayed)  Labs Reviewed  COMPREHENSIVE METABOLIC PANEL - Abnormal; Notable for the following components:      Result Value   Glucose, Bld 121 (*)    BUN <5 (*)    Alkaline Phosphatase 131 (*)    All other components within normal limits  RAPID URINE DRUG SCREEN, HOSP PERFORMED - Abnormal; Notable for the following components:   Opiates POSITIVE (*)    Benzodiazepines POSITIVE (*)    All other components within normal limits  ETHANOL  CBC  I-STAT BETA HCG BLOOD, ED ([facility], [facility], [facility] ONLY)    EKG None  Radiology No results found.  Procedures Procedures (including critical care time)  Medications Ordered in ED Medications  ALPRAZolam Duanne Moron) tablet 0.5 mg (0.5 mg Oral Given 11/12/18 2332)  lisinopril (ZESTRIL) tablet 10 mg (has no administration in time range)  PARoxetine (PAXIL) tablet 40 mg (has no administration in time range)  QUEtiapine (SEROQUEL) tablet 200 mg (200 mg Oral Not Given 11/13/18 0111)  pantoprazole (PROTONIX) EC tablet 40 mg (has no administration in time range)  HYDROcodone-acetaminophen (NORCO) 10-325 MG per tablet 1 tablet (1 tablet Oral Given 11/12/18 2332)  Melatonin TABS 3 mg (3 mg Oral Given 11/12/18 2352)  Melatonin TABS 3 mg (3 mg Oral Given 11/13/18 0414)     Initial Impression / Assessment and Plan / ED Course  I have reviewed the triage vital signs and the nursing notes.  Pertinent labs & imaging results that were available during my care of the patient were reviewed by me and considered in my medical decision making (see chart for details).        43 year old female presents to the emergency department for psychiatric evaluation.  She has been medically cleared and evaluated by TTS.  Pending recommendations.  Disposition to be determined by oncoming ED provider.   Final Clinical Impressions(s) / ED Diagnoses   Final diagnoses:  Depression, unspecified depression type    ED Discharge Orders    None       Antonietta Breach, PA-C 11/13/18 0604     Margette Fast, MD 11/13/18 [phone]

## 2018-11-12 NOTE — ED Notes (Addendum)
In to introduce self to pt, pt is agitated stating "I need my medicine, when's the doctor going to come and see me?". Advised pt she was in a treatment room and would be seen shortly, asked what meds she needed. Pt stated she needed "hydrocodone 10/325". Advised pt to let MD know upon assessment and I would admin as soon as ordered. Pt stated she needed to use the restroom stating "Show me where the bathroom is, and you better hope I don't fall". When asked why pt felt she may fall she stated "my head has been busting since I got here, I asked them to recheck my blood pressure and they never did, and who knows I just might fall now". Rechecked BP per pt request-135/86. I offered to assist pt to bathroom or to use a wheelchair, pt stated "No just show me where". Pt refused help to restroom, ambulated w/ steady gait. Pt then stated "where can I shower in here? When do they feed me?" Advised pt if she was required to stay overnight we'd be happy to show her the shower, and we needed to wait for a provider to clear her to eat. Pt became increasingly agitated and stomped down hall to bathroom mumbling curse words. Returned to room and placed back in stretcher. No acute distress, advised pt MD would be in shortly to assess and encouraged her to ring out should she require additional assistance.

## 2018-11-12 NOTE — ED Notes (Signed)
pts personal items placed in locker number 8. 1 bag

## 2018-11-13 ENCOUNTER — Other Ambulatory Visit: Payer: Self-pay

## 2018-11-13 ENCOUNTER — Inpatient Hospital Stay (HOSPITAL_COMMUNITY)
Admission: AD | Admit: 2018-11-13 | Discharge: 2018-11-15 | DRG: 885 | Disposition: A | Discharge status: Home / Self Care | Payer: Medicare Other | Source: Intra-hospital | Attending: Psychiatry | Admitting: Psychiatry

## 2018-11-13 ENCOUNTER — Encounter (HOSPITAL_COMMUNITY): Payer: Self-pay

## 2018-11-13 DIAGNOSIS — Z885 Allergy status to narcotic agent status: Secondary | ICD-10-CM | POA: Diagnosis not present

## 2018-11-13 DIAGNOSIS — Z90710 Acquired absence of both cervix and uterus: Secondary | ICD-10-CM | POA: Diagnosis not present

## 2018-11-13 DIAGNOSIS — F329 Major depressive disorder, single episode, unspecified: Secondary | ICD-10-CM

## 2018-11-13 DIAGNOSIS — Z91410 Personal history of adult physical and sexual abuse: Secondary | ICD-10-CM | POA: Diagnosis not present

## 2018-11-13 DIAGNOSIS — Z20828 Contact with and (suspected) exposure to other viral communicable diseases: Secondary | ICD-10-CM | POA: Diagnosis present

## 2018-11-13 DIAGNOSIS — Z888 Allergy status to other drugs, medicaments and biological substances status: Secondary | ICD-10-CM | POA: Diagnosis not present

## 2018-11-13 DIAGNOSIS — G8929 Other chronic pain: Secondary | ICD-10-CM | POA: Diagnosis present

## 2018-11-13 DIAGNOSIS — G4700 Insomnia, unspecified: Secondary | ICD-10-CM | POA: Diagnosis present

## 2018-11-13 DIAGNOSIS — F4310 Post-traumatic stress disorder, unspecified: Secondary | ICD-10-CM | POA: Diagnosis present

## 2018-11-13 DIAGNOSIS — F419 Anxiety disorder, unspecified: Secondary | ICD-10-CM | POA: Diagnosis present

## 2018-11-13 DIAGNOSIS — Z8249 Family history of ischemic heart disease and other diseases of the circulatory system: Secondary | ICD-10-CM

## 2018-11-13 DIAGNOSIS — K589 Irritable bowel syndrome without diarrhea: Secondary | ICD-10-CM | POA: Diagnosis present

## 2018-11-13 DIAGNOSIS — Z881 Allergy status to other antibiotic agents status: Secondary | ICD-10-CM | POA: Diagnosis not present

## 2018-11-13 DIAGNOSIS — F315 Bipolar disorder, current episode depressed, severe, with psychotic features: Secondary | ICD-10-CM | POA: Diagnosis not present

## 2018-11-13 DIAGNOSIS — F319 Bipolar disorder, unspecified: Principal | ICD-10-CM | POA: Diagnosis present

## 2018-11-13 DIAGNOSIS — F339 Major depressive disorder, recurrent, unspecified: Secondary | ICD-10-CM | POA: Diagnosis present

## 2018-11-13 HISTORY — DX: Major depressive disorder, single episode, unspecified: F32.9

## 2018-11-13 LAB — SARS CORONAVIRUS 2 BY RT PCR (HOSPITAL ORDER, PERFORMED IN ~~LOC~~ HOSPITAL LAB): SARS Coronavirus 2: NEGATIVE

## 2018-11-13 MED ORDER — MELATONIN 3 MG PO TABS
3.0000 mg | ORAL_TABLET | Freq: Once | ORAL | Status: AC
  Administered 2018-11-13: 3 mg via ORAL
  Filled 2018-11-13: qty 1

## 2018-11-13 MED ORDER — ALUM & MAG HYDROXIDE-SIMETH 200-200-20 MG/5ML PO SUSP
30.0000 mL | ORAL | Status: DC | PRN
  Administered 2018-11-14: 30 mL via ORAL
  Filled 2018-11-13: qty 30

## 2018-11-13 MED ORDER — ACETAMINOPHEN 325 MG PO TABS
650.0000 mg | ORAL_TABLET | Freq: Four times a day (QID) | ORAL | Status: DC | PRN
  Administered 2018-11-13 – 2018-11-15 (×3): 650 mg via ORAL
  Filled 2018-11-13 (×3): qty 2

## 2018-11-13 MED ORDER — MAGNESIUM HYDROXIDE 400 MG/5ML PO SUSP: 30.0000 mL | Freq: Every day | ORAL | Status: DC | PRN

## 2018-11-13 MED ORDER — CLONAZEPAM 0.5 MG PO TABS
0.5000 mg | ORAL_TABLET | Freq: Two times a day (BID) | ORAL | Status: DC
  Administered 2018-11-13 – 2018-11-15 (×4): 0.5 mg via ORAL
  Filled 2018-11-13 (×4): qty 1

## 2018-11-13 MED ORDER — HYDROXYZINE HCL 25 MG PO TABS: 25.0000 mg | ORAL_TABLET | Freq: Four times a day (QID) | ORAL | Status: DC | PRN

## 2018-11-13 MED ORDER — RISPERIDONE 2 MG PO TABS
2.0000 mg | ORAL_TABLET | Freq: Two times a day (BID) | ORAL | Status: DC
  Administered 2018-11-13 – 2018-11-14 (×2): 2 mg via ORAL
  Filled 2018-11-13 (×5): qty 1

## 2018-11-13 MED ORDER — NICOTINE 21 MG/24HR TD PT24
21.0000 mg | MEDICATED_PATCH | Freq: Every day | TRANSDERMAL | Status: DC
  Filled 2018-11-13 (×3): qty 1

## 2018-11-13 MED ORDER — TEMAZEPAM 30 MG PO CAPS
30.0000 mg | ORAL_CAPSULE | Freq: Every day | ORAL | Status: DC
  Administered 2018-11-13: 30 mg via ORAL

## 2018-11-13 MED ORDER — TRAZODONE HCL 50 MG PO TABS
50.0000 mg | ORAL_TABLET | Freq: Every evening | ORAL | Status: DC | PRN
  Administered 2018-11-13: 50 mg via ORAL
  Filled 2018-11-13: qty 1

## 2018-11-13 MED ORDER — LISINOPRIL 10 MG PO TABS
10.0000 mg | ORAL_TABLET | Freq: Every day | ORAL | Status: DC
  Administered 2018-11-13: 10 mg via ORAL
  Filled 2018-11-13: qty 1
  Filled 2018-11-13: qty 2
  Filled 2018-11-13 (×2): qty 1

## 2018-11-13 MED ORDER — CARBAMAZEPINE 100 MG PO CHEW
100.0000 mg | CHEWABLE_TABLET | Freq: Two times a day (BID) | ORAL | Status: DC
  Administered 2018-11-13 – 2018-11-14 (×2): 100 mg via ORAL
  Filled 2018-11-13 (×5): qty 1

## 2018-11-13 NOTE — ED Notes (Addendum)
Pt requested to walk in the halls, explained that is not permitted at this time but that pt is allowed to walk in room. Pt says "I'm weak, if I fall it's on y'all." Explained that pt is within eyesight of sitters. Pt declines to walk at this time. Pt has been ambulating without any difficulty.

## 2018-11-13 NOTE — BH Assessment (Signed)
Tele Assessment Note   Patient Name: [name] MRN: [id number] Referring Physician: Antonietta Breach, PA-C Location of Patient: [facility] Location of Provider: [place] is an 43 y.o. female with complaints of worsening depression and hallucinations.  Patient reports increase in the past 2-3 years. Patient reported her medications are not working. Patient denied SI and HI. Patient reported hearing command voices telling her to clean up, how to take a shower, or saying "I got them". Patient reported hallucinations increased since living in a bad area. Patient reported inpatient mental health treatment 3 years ago after she disclosed that she had another twin daughter saying "they told me I was a liar". During assessment patient wanted clinician to assist her in finding her twin daughter and strongly felt she had another twin daughter that she last seen in 2012 and her husbands twin and continued to state they all look different. Patient rambled about both sets of twins and how nobody believes her. Patient reported "my husband good twin stepped on my foot, he didn't mean it though". Patient reported "names of people" have been circulating in her memory when they were not people she was initially thinking about. She believes she has a degree of PTSD and her medications need to be changed. Patient resides with ex-husband, daughter and son. Patient reported insomnia, 4-5 days without sleeping and poor appetite. Patient reported increased depressive symptoms. Patient reported medications are not working.   Diagnosis: Major depressive disorder  Past Medical History:  Past Medical History:  Diagnosis Date  . Anxiety   . Back pain   . IBS (irritable bowel syndrome)     Past Surgical History:  Procedure Laterality Date  . ABDOMINAL HYSTERECTOMY      Family History:  Family History  Problem Relation Age of Onset  . Renal Disease Mother   . Heart attack Father      Social History:  reports that she quit smoking about 4 years ago. She has never used smokeless tobacco. She reports that she does not drink alcohol or use drugs.  Additional Social History:  Alcohol / Drug Use Pain Medications: see MAR Prescriptions: see MAR Over the Counter: see MAR  CIWA: CIWA-Ar BP: (!) 129/93 Pulse Rate: (!) 109 COWS:    Allergies:  Allergies  Allergen Reactions  . Amoxicillin Nausea And Vomiting  . Chantix [Varenicline] Other (See Comments)    Causes bad nightmares, hallucinations  . Codeine Other (See Comments)  . Divalproex Sodium Other (See Comments)    sedation  . Lithium Other (See Comments)    Drowsiness, tremors drowsiness  . Propoxyphene Nausea And Vomiting  . Augmentin [Amoxicillin-Pot Clavulanate] Diarrhea and Other (See Comments)  . Zolpidem Other (See Comments)    Patient states it does not agree with her body.     Home Medications: (Not in a hospital admission)   OB/GYN Status:  No LMP recorded. Patient has had a hysterectomy.  General Assessment Data Location of Assessment: [facility] ED TTS Assessment: In system Is this a Tele or Face-to-Face Assessment?: Tele Assessment Is this an Initial Assessment or a Re-assessment for this encounter?: Initial Assessment Patient Accompanied by:: N/A Language Other than English: No Living Arrangements: (family home) What gender do you identify as?: Female Marital status: Single Pregnancy Status: Unknown Living Arrangements: Children, Other relatives Can pt return to current living arrangement?: Yes Admission Status: Voluntary Is patient capable of signing voluntary admission?: Yes Referral Source: Self/Family/Friend     Crisis  Care Plan Living Arrangements: Children, Other relatives Legal Guardian: (self) Name of Psychiatrist: (none) Name of Therapist: (none)  Education Status Is patient currently in school?: No Is the patient employed, unemployed or receiving disability?: Receiving  disability income  Risk to self with the past 6 months Suicidal Ideation: No Has patient been a risk to self within the past 6 months prior to admission? : No Suicidal Intent: No Has patient had any suicidal intent within the past 6 months prior to admission? : No Is patient at risk for suicide?: No Suicidal Plan?: No Has patient had any suicidal plan within the past 6 months prior to admission? : No Access to Means: No What has been your use of drugs/alcohol within the last 12 months?: (denied) Previous Attempts/Gestures: No How many times?: (0) Other Self Harm Risks: (denied) Triggers for Past Attempts: (n/a) Intentional Self Injurious Behavior: None Family Suicide History: No Recent stressful life event(s): (hallucinations) Persecutory voices/beliefs?: No Depression: Yes Depression Symptoms: Isolating, Fatigue, Insomnia, Loss of interest in usual pleasures Substance abuse history and/or treatment for substance abuse?: No Suicide prevention information given to non-admitted patients: Not applicable  Risk to Others within the past 6 months Homicidal Ideation: No Does patient have any lifetime risk of violence toward others beyond the six months prior to admission? : No Thoughts of Harm to Others: No Current Homicidal Intent: No Current Homicidal Plan: No Access to Homicidal Means: No Identified Victim: (n/a) History of harm to others?: No Assessment of Violence: None Noted Violent Behavior Description: (none) Does patient have access to weapons?: No Criminal Charges Pending?: No Does patient have a court date: No Is patient on probation?: No  Psychosis Hallucinations: None noted Delusions: None noted  Mental Status Report Appearance/Hygiene: In scrubs Eye Contact: Fair Motor Activity: Freedom of movement Speech: Logical/coherent Level of Consciousness: Alert Mood: Depressed Affect: Depressed Anxiety Level: Minimal Thought Processes: Coherent Judgement:  Impaired Orientation: Person, Place, Time, Situation Obsessive Compulsive Thoughts/Behaviors: None  Cognitive Functioning Concentration: Fair Memory: Recent Intact Is patient IDD: No Insight: Poor Impulse Control: Poor Appetite: Poor Have you had any weight changes? : Loss Sleep: Decreased Total Hours of Sleep: ("5 days without sleep") Vegetative Symptoms: None  ADLScreening [facility] Assessment Services) Patient's cognitive ability adequate to safely complete daily activities?: Yes Patient able to express need for assistance with ADLs?: Yes Independently performs ADLs?: Yes (appropriate for developmental age)  Prior Inpatient Therapy Prior Inpatient Therapy: Yes Prior Therapy Dates: (2-3 years ago) Prior Therapy Facilty/Provider(s): [facility]) Reason for Treatment: (hallucinations)  Prior Outpatient Therapy Prior Outpatient Therapy: No Does patient have an ACCT team?: No Does patient have Intensive In-House Services?  : No Does patient have [facility] services? : No Does patient have P4CC services?: No  ADL Screening (condition at time of admission) Patient's cognitive ability adequate to safely complete daily activities?: Yes Patient able to express need for assistance with ADLs?: Yes Independently performs ADLs?: Yes (appropriate for developmental age)  Regulatory affairs officer (For Healthcare) Does Patient Have a Medical Advance Directive?: No   Disposition:  Lurline Del, NP, patient meets inpatient criteria. TTS to secure placement. AC, no appropriate beds at this time.    This service was provided via telemedicine using a 2-way, interactive audio and video technology.  Names of all persons participating in this telemedicine service and their role in this encounter. Name: [name] Role: Patient  Name: Kirtland Bouchard Role: TTS Clinician  Name:  Role:   Name:  Role:     Herbert Spires  Gerturde Kuba 11/13/2018 6:00 AM

## 2018-11-13 NOTE — ED Notes (Signed)
TTS at bedside. 
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
## 2018-11-13 NOTE — ED Notes (Signed)
Patient was given a Snack and drink. A Diet was ordered for Lunch.

## 2018-11-13 NOTE — ED Provider Notes (Signed)
Emergency Medicine Observation Re-evaluation Note  [name] is a 43 y.o. female, seen on rounds today.  Pt initially presented to the ED for complaints of Medical Clearance  Here for SI / HI, depression hallucinations  Physical Exam  BP 123/82 (BP Location: Left Arm)   Pulse 86   Temp 98.6 F (37 C) (Oral)   Resp 18   SpO2 97%  Physical Exam WDWN  NAD Resting Normal RR ED Course / MDM  EKG:    I have reviewed the labs performed to date as well as medications administered while in observation.  Recent changes in the last 24 hours include   Results for orders placed or performed during the hospital encounter of 11/12/18  Comprehensive metabolic panel  Result Value Ref Range   Sodium 144 135 - 145 mmol/L   Potassium 3.9 3.5 - 5.1 mmol/L   Chloride 106 98 - 111 mmol/L   CO2 27 22 - 32 mmol/L   Glucose, Bld 121 (H) 70 - 99 mg/dL   BUN <5 (L) 6 - 20 mg/dL   Creatinine, Ser 0.70 0.44 - 1.00 mg/dL   Calcium 9.9 8.9 - 10.3 mg/dL   Total Protein 7.5 6.5 - 8.1 g/dL   Albumin 4.1 3.5 - 5.0 g/dL   AST 24 15 - 41 U/L   ALT 13 0 - 44 U/L   Alkaline Phosphatase 131 (H) 38 - 126 U/L   Total Bilirubin 0.3 0.3 - 1.2 mg/dL   GFR calc non Af Amer >60 >60 mL/min   GFR calc Af Amer >60 >60 mL/min   Anion gap 11 5 - 15  Ethanol  Result Value Ref Range   Alcohol, Ethyl (B) <10 <10 mg/dL  cbc  Result Value Ref Range   WBC 9.5 4.0 - 10.5 K/uL   RBC 4.59 3.87 - 5.11 MIL/uL   Hemoglobin 14.0 12.0 - 15.0 g/dL   HCT 42.0 36.0 - 46.0 %   MCV 91.5 80.0 - 100.0 fL   MCH 30.5 26.0 - 34.0 pg   MCHC 33.3 30.0 - 36.0 g/dL   RDW 13.0 11.5 - 15.5 %   Platelets 261 150 - 400 K/uL   nRBC 0.0 0.0 - 0.2 %  Rapid urine drug screen (hospital performed)  Result Value Ref Range   Opiates POSITIVE (A) NONE DETECTED   Cocaine NONE DETECTED NONE DETECTED   Benzodiazepines POSITIVE (A) NONE DETECTED   Amphetamines NONE DETECTED NONE DETECTED   Tetrahydrocannabinol NONE DETECTED NONE DETECTED   Barbiturates NONE DETECTED NONE DETECTED  I-Stat beta hCG blood, ED  Result Value Ref Range   I-stat hCG, quantitative <5.0 <5 mIU/mL   Comment 3            Plan  Current plan is for inpatient Psych placement. Patient is not under full IVC at this time.   Margarita Mail, PA-C 11/13/18 1649    Sherwood Gambler, MD 11/14/18 [id number]

## 2018-11-13 NOTE — Tx Team (Signed)
Initial Treatment Plan 11/13/2018 4:11 PM [name]:[id number]    PATIENT STRESSORS: Financial difficulties Health problems Medication change or noncompliance Traumatic event   PATIENT STRENGTHS: Ability for insight Active sense of humor Capable of independent living Communication skills General fund of knowledge Motivation for treatment/growth Physical Health Supportive family/friends   PATIENT IDENTIFIED PROBLEMS: "depression"  "hearing voices"  "rage"                 DISCHARGE CRITERIA:  Ability to meet basic life and health needs Adequate post-discharge living arrangements Improved stabilization in mood, thinking, and/or behavior Medical problems require only outpatient monitoring  PRELIMINARY DISCHARGE PLAN: Attend aftercare/continuing care group Participate in family therapy Return to previous living arrangement  PATIENT/FAMILY INVOLVEMENT: This treatment plan has been presented to and reviewed with the patient, [name].  The patient and family have been given the opportunity to ask questions and make suggestions.  Baron Sane, RN 11/13/2018, 4:11 PM

## 2018-11-13 NOTE — Progress Notes (Signed)
Nursing Progress Note: 7p-7a D: Pt currently presents with a anxious/flat/depressed affect and behavior. Interacting appropriately with the milieu. Pt reports good sleep during the previous night with current medication regimen.  A: Pt provided with medications per providers orders. Pt's labs and vitals were monitored throughout the night. Pt supported emotionally and encouraged to express concerns and questions. Pt educated on medications.  R: Pt's safety ensured with 15 minute and environmental checks. Pt currently denies SI, HI, and VH and endorses AH. Pt verbally contracts to seek staff if SI,HI, or AVH occurs and to consult with staff before acting on any harmful thoughts. Will continue to monitor.   [name] NOVEL CORONAVIRUS (COVID-19) DAILY CHECK-OFF SYMPTOMS - answer yes or no to each - every day NO YES  Have you had a fever in the past 24 hours?  . Fever (Temp > 37.80C / 100F) X   Have you had any of these symptoms in the past 24 hours? . New Cough .  Sore Throat  .  Shortness of Breath .  Difficulty Breathing .  Unexplained Body Aches   X   Have you had any one of these symptoms in the past 24 hours not related to allergies?   . Runny Nose .  Nasal Congestion .  Sneezing   X   If you have had runny nose, nasal congestion, sneezing in the past 24 hours, has it worsened?  X   EXPOSURES - check yes or no X   Have you traveled outside the state in the past 14 days?  X   Have you been in contact with someone with a confirmed diagnosis of COVID-19 or PUI in the past 14 days without wearing appropriate PPE?  X   Have you been living in the same home as a person with confirmed diagnosis of COVID-19 or a PUI (household contact)?    X   Have you been diagnosed with COVID-19?    X              What to do next: Answered NO to all: Answered YES to anything:   Proceed with unit schedule Follow the [facility] Inpatient Flowsheet.

## 2018-11-14 MED ORDER — LISINOPRIL 5 MG PO TABS
5.0000 mg | ORAL_TABLET | Freq: Every day | ORAL | Status: DC
  Administered 2018-11-15: 5 mg via ORAL
  Filled 2018-11-14 (×2): qty 1

## 2018-11-14 MED ORDER — HYDROCODONE-ACETAMINOPHEN 5-325 MG PO TABS
1.0000 | ORAL_TABLET | Freq: Three times a day (TID) | ORAL | Status: DC
  Administered 2018-11-14 – 2018-11-15 (×4): 1 via ORAL
  Filled 2018-11-14 (×4): qty 1

## 2018-11-14 MED ORDER — PRENATAL MULTIVITAMIN CH
1.0000 | ORAL_TABLET | Freq: Every day | ORAL | Status: DC
  Administered 2018-11-14 – 2018-11-15 (×2): 1 via ORAL
  Filled 2018-11-14 (×2): qty 1

## 2018-11-14 MED ORDER — VORTIOXETINE HBR 10 MG PO TABS
10.0000 mg | ORAL_TABLET | Freq: Every day | ORAL | Status: DC
  Administered 2018-11-14 – 2018-11-15 (×2): 10 mg via ORAL
  Filled 2018-11-14 (×3): qty 1

## 2018-11-14 MED ORDER — TRAZODONE HCL 100 MG PO TABS
100.0000 mg | ORAL_TABLET | Freq: Every evening | ORAL | Status: DC | PRN
  Administered 2018-11-14: 100 mg via ORAL
  Filled 2018-11-14: qty 1

## 2018-11-14 MED ORDER — MIRTAZAPINE 15 MG PO TBDP
15.0000 mg | ORAL_TABLET | Freq: Every day | ORAL | Status: DC
  Administered 2018-11-14: 15 mg via ORAL
  Filled 2018-11-14 (×2): qty 1

## 2018-11-14 NOTE — BHH Suicide Risk Assessment (Signed)
[facility] INPATIENT:  Family/Significant Other Suicide Prevention Education  Suicide Prevention Education:  Patient Refusal for Family/Significant Other Suicide Prevention Education: The patient [name] has refused to provide written consent for family/significant other to be provided Family/Significant Other Suicide Prevention Education during admission and/or prior to discharge.  Physician notified.  Patient refused consent and SW reviewed suicide prevention education handout and reviewed crisis resources with patient.   Tye Savoy 11/14/2018, 12:45 PM
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
## 2018-11-14 NOTE — Progress Notes (Signed)
Patient denies SI, HI and AVH.  Patient has been compliant with all medications and treatment.  Patient has had no incident of behavioral dyscontrol.   Assess patient for safety, offer medications as prescribed engage patient in 1:1 staff talks.   Patient is able to contract for safety, continue to monitor as planned.
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
## 2018-11-14 NOTE — [facility] Group Notes (Signed)
[facility] LCSW Group Therapy Note  Date/Time: 11/14/2018 @ 1:00pm  Type of Therapy/Topic:  Group Therapy:  Feelings about Diagnosis  Participation Level:  Active   Mood: Pleasant   Description of Group:    This group will allow patients to explore their thoughts and feelings about diagnoses they have received. Patients will be guided to explore their level of understanding and acceptance of these diagnoses. Facilitator will encourage patients to process their thoughts and feelings about the reactions of others to their diagnosis, and will guide patients in identifying ways to discuss their diagnosis with significant others in their lives. This group will be process-oriented, with patients participating in exploration of their own experiences as well as giving and receiving support and challenge from other group members.   Therapeutic Goals: 1. Patient will demonstrate understanding of diagnosis as evidence by identifying two or more symptoms of the disorder:  2. Patient will be able to express two feelings regarding the diagnosis 3. Patient will demonstrate ability to communicate their needs through discussion and/or role plays  Summary of Patient Progress:    Patient was engaged and active during group therapy. Patient was able to identify her feelings around her diagnosis. She stated that she didn't like her diagnosis but stated that she has things that make it better (medication and therapy). Patient discussed the struggle of her family when it comes to her mental health diagnosis. Patient stated that people often make it about themselves instead of just listening and being there. Patient was cooperative and engaged with her fellow group members in conversation and support.     Therapeutic Modalities:   Cognitive Behavioral Therapy Brief Therapy Feelings Identification   Ardelle Anton, LCSW

## 2018-11-15 MED ORDER — ENBRACE HR PO CAPS: 1.0000 | ORAL_CAPSULE | Freq: Every day | ORAL | 11 refills | Status: DC

## 2018-11-15 MED ORDER — MIRTAZAPINE 15 MG PO TBDP: 15.0000 mg | ORAL_TABLET | Freq: Every day | ORAL | 1 refills | Status: DC

## 2018-11-15 MED ORDER — VORTIOXETINE HBR 20 MG PO TABS: 20.0000 mg | ORAL_TABLET | Freq: Every day | ORAL | 1 refills | Status: DC

## 2018-11-15 NOTE — Plan of Care (Signed)
D: Patient endorses VH. Denies SI, HI, AH, and verbally contracts for safety.    A: Medications administered per MD order. Support provided. Patient educated on safety on the unit and medications. Routine safety checks every 15 minutes. Patient stated understanding to tell nurse about any new physical symptoms. Patient understands to tell staff of any needs.     R: No adverse drug reactions noted. Patient verbally contracts for safety. Patient remains safe at this time and will continue to monitor.   Problem: Education: Goal: Knowledge of [facility] Education information/materials will improve Outcome: Progressing   [place] NOVEL CORONAVIRUS (COVID-19) DAILY CHECK-OFF SYMPTOMS - answer yes or no to each - every day NO YES  Have you had a fever in the past 24 hours?  Fever (Temp > 37.80C / 100F) X   Have you had any of these symptoms in the past 24 hours? New Cough  Sore Throat   Shortness of Breath  Difficulty Breathing  Unexplained Body Aches   X   Have you had any one of these symptoms in the past 24 hours not related to allergies?   Runny Nose  Nasal Congestion  Sneezing   X   If you have had runny nose, nasal congestion, sneezing in the past 24 hours, has it worsened?  X   EXPOSURES - check yes or no X   Have you traveled outside the state in the past 14 days?  X   Have you been in contact with someone with a confirmed diagnosis of COVID-19 or PUI in the past 14 days without wearing appropriate PPE?  X   Have you been living in the same home as a person with confirmed diagnosis of COVID-19 or a PUI (household contact)?    X   Have you been diagnosed with COVID-19?    X              What to do next: Answered NO to all: Answered YES to anything:   Proceed with unit schedule Follow the [facility] Inpatient Flowsheet.

## 2018-11-15 NOTE — Tx Team (Signed)
Interdisciplinary Treatment and Diagnostic Plan Update  11/15/2018 Time of Session:  [name] MRN: [id number]  Principal Diagnosis: <principal problem not specified>  Secondary Diagnoses: Active Problems:   Bipolar disorder ([place])   Major depressive disorder, recurrent episode ([place])   Current Medications:  Current Facility-Administered Medications  Medication Dose Route Frequency Provider Last Rate Last Dose  . acetaminophen (TYLENOL) tablet 650 mg  650 mg Oral Q6H PRN Lindell Spar I, NP   650 mg at 11/15/18 0332  . alum & mag hydroxide-simeth (MAALOX/MYLANTA) 200-200-20 MG/5ML suspension 30 mL  30 mL Oral Q4H PRN Lindell Spar I, NP   30 mL at 11/14/18 1741  . clonazePAM (KLONOPIN) tablet 0.5 mg  0.5 mg Oral BID Johnn Hai, MD   0.5 mg at 11/15/18 0745  . HYDROcodone-acetaminophen (NORCO/VICODIN) 5-325 MG per tablet 1 tablet  1 tablet Oral TID Johnn Hai, MD   1 tablet at 11/15/18 [phone]  . hydrOXYzine (ATARAX/VISTARIL) tablet 25 mg  25 mg Oral Q6H PRN Lindell Spar I, NP      . lisinopril (ZESTRIL) tablet 5 mg  5 mg Oral Daily Johnn Hai, MD   5 mg at 11/15/18 0745  . magnesium hydroxide (MILK OF MAGNESIA) suspension 30 mL  30 mL Oral Daily PRN Nwoko, Agnes I, NP      . mirtazapine (REMERON SOL-TAB) disintegrating tablet 15 mg  15 mg Oral QHS Johnn Hai, MD   15 mg at 11/14/18 2100  . nicotine (NICODERM CQ - dosed in mg/24 hours) patch 21 mg  21 mg Transdermal Q0600 Nwoko, Herbert Pun I, NP      . prenatal multivitamin tablet 1 tablet  1 tablet Oral Q1200 Johnn Hai, MD   1 tablet at 11/14/18 1343  . traZODone (DESYREL) tablet 100 mg  100 mg Oral QHS PRN Johnn Hai, MD   100 mg at 11/14/18 2059  . vortioxetine HBr (TRINTELLIX) tablet 10 mg  10 mg Oral Daily Johnn Hai, MD   10 mg at 11/15/18 0745   PTA Medications: Medications Prior to Admission  Medication Sig Dispense Refill Last Dose  . ALPRAZolam (XANAX) 0.5 MG tablet Take 1 tablet (0.5 mg total) by mouth 2 (two)  times daily as needed for anxiety. 60 tablet 2   . Blood Pressure Monitoring (B-D ASSURE BPM/AUTO ARM CUFF) MISC 1 Device by Does not apply route daily. 1 each 0   . Calcium Polycarbophil (FIBER-CAPS PO) Take 1 capsule by mouth daily.      . cetirizine (ZYRTEC) 10 MG tablet Take 1 tablet (10 mg total) by mouth daily. (Patient taking differently: Take 10 mg by mouth daily as needed for allergies. ) 30 tablet 11   . HYDROcodone-acetaminophen (NORCO) 10-325 MG tablet Take 1 tablet by mouth every 6 (six) hours as needed for moderate pain (For Neck and Back Pain).     Marland Kitchen lisinopril (ZESTRIL) 10 MG tablet Take 1 tablet (10 mg total) by mouth daily. (Patient taking differently: Take 10 mg by mouth daily as needed. For high blood pressure) 90 tablet 1   . methocarbamol (ROBAXIN) 500 MG tablet Take 500 mg by mouth 2 (two) times daily as needed for muscle spasms.     Marland Kitchen omeprazole (PRILOSEC) 20 MG capsule Take 1 capsule (20 mg total) by mouth daily. 90 capsule 1   . PARoxetine (PAXIL) 40 MG tablet Take 1 tablet (40 mg total) by mouth every morning. 90 tablet 1   . QUEtiapine (SEROQUEL) 400 MG tablet Take 0.5 tablets (  200 mg total) by mouth 2 (two) times daily. 180 tablet 1     Patient Stressors: Financial difficulties Health problems Medication change or noncompliance Traumatic event  Patient Strengths: Ability for insight Active sense of humor Capable of independent living Communication skills General fund of knowledge Motivation for treatment/growth Physical Health Supportive family/friends  Treatment Modalities: Medication Management, Group therapy, Case management,  1 to 1 session with clinician, Psychoeducation, Recreational therapy.   Physician Treatment Plan for Primary Diagnosis: <principal problem not specified> Long Term Goal(s): Improvement in symptoms so as ready for discharge Improvement in symptoms so as ready for discharge   Short Term Goals: Ability to verbalize feelings will  improve Ability to disclose and discuss suicidal ideas Ability to demonstrate self-control will improve Ability to identify and develop effective coping behaviors will improve Ability to maintain clinical measurements within normal limits will improve Compliance with prescribed medications will improve Ability to verbalize feelings will improve Ability to disclose and discuss suicidal ideas Ability to demonstrate self-control will improve Ability to identify and develop effective coping behaviors will improve Ability to maintain clinical measurements within normal limits will improve Compliance with prescribed medications will improve  Medication Management: Evaluate patient's response, side effects, and tolerance of medication regimen.  Therapeutic Interventions: 1 to 1 sessions, Unit Group sessions and Medication administration.  Evaluation of Outcomes: Adequate for Discharge  Physician Treatment Plan for Secondary Diagnosis: Active Problems:   Bipolar disorder ([place])   Major depressive disorder, recurrent episode ([place])  Long Term Goal(s): Improvement in symptoms so as ready for discharge Improvement in symptoms so as ready for discharge   Short Term Goals: Ability to verbalize feelings will improve Ability to disclose and discuss suicidal ideas Ability to demonstrate self-control will improve Ability to identify and develop effective coping behaviors will improve Ability to maintain clinical measurements within normal limits will improve Compliance with prescribed medications will improve Ability to verbalize feelings will improve Ability to disclose and discuss suicidal ideas Ability to demonstrate self-control will improve Ability to identify and develop effective coping behaviors will improve Ability to maintain clinical measurements within normal limits will improve Compliance with prescribed medications will improve     Medication Management: Evaluate patient's response,  side effects, and tolerance of medication regimen.  Therapeutic Interventions: 1 to 1 sessions, Unit Group sessions and Medication administration.  Evaluation of Outcomes: Adequate for Discharge   RN Treatment Plan for Primary Diagnosis: <principal problem not specified> Long Term Goal(s): Knowledge of disease and therapeutic regimen to maintain health will improve  Short Term Goals: Ability to participate in decision making will improve, Ability to verbalize feelings will improve, Ability to disclose and discuss suicidal ideas, Ability to identify and develop effective coping behaviors will improve and Compliance with prescribed medications will improve  Medication Management: RN will administer medications as ordered by provider, will assess and evaluate patient's response and provide education to patient for prescribed medication. RN will report any adverse and/or side effects to prescribing provider.  Therapeutic Interventions: 1 on 1 counseling sessions, Psychoeducation, Medication administration, Evaluate responses to treatment, Monitor vital signs and CBGs as ordered, Perform/monitor CIWA, COWS, AIMS and Fall Risk screenings as ordered, Perform wound care treatments as ordered.  Evaluation of Outcomes: Adequate for Discharge   LCSW Treatment Plan for Primary Diagnosis: <principal problem not specified> Long Term Goal(s): Safe transition to appropriate next level of care at discharge, Engage patient in therapeutic group addressing interpersonal concerns.  Short Term Goals: Engage patient in aftercare planning with  referrals and resources  Therapeutic Interventions: Assess for all discharge needs, 1 to 1 time with Social worker, Explore available resources and support systems, Assess for adequacy in community support network, Educate family and significant other(s) on suicide prevention, Complete Psychosocial Assessment, Interpersonal group therapy.  Evaluation of Outcomes: Adequate  for Discharge   Progress in Treatment: Attending groups: Yes. Participating in groups: Yes. Taking medication as prescribed: Yes. Toleration medication: Yes. Family/Significant other contact made: No, will contact:  patient declined consent for collateral contacts Patient understands diagnosis: Limited insight  Discussing patient identified problems/goals with staff: Yes. Medical problems stabilized or resolved: Yes. Denies suicidal/homicidal ideation: Yes. Issues/concerns per patient self-inventory: No. Other:  New problem(s) identified: None  New Short Term/Long Term Goal(s):medication stabilization, elimination of SI thoughts, development of comprehensive mental wellness plan.    Patient Goals:  "Get my meds for my depression together"   Discharge Plan or Barriers: Patient plans to discharge home with her children. She will follow up with Chevis Pretty, FNP for medication management services at discharge.   Reason for Continuation of Hospitalization: None   Estimated Length of Stay: Discharge, 11/15/2018  Attendees: Patient: [name]  11/15/2018 9:45 AM  Physician: Dr. Johnn Hai, MD 11/15/2018 9:45 AM  Nursing: Nicoletta Dress.Viona Gilmore, RN 11/15/2018 9:45 AM  RN Care Manager: 11/15/2018 9:45 AM  Social Worker: Radonna Ricker, [place] 11/15/2018 9:45 AM  Recreational Therapist:  11/15/2018 9:45 AM  Other:  11/15/2018 9:45 AM  Other:  11/15/2018 9:45 AM  Other: 11/15/2018 9:45 AM    Scribe for Treatment Team: Marylee Floras, [place] 11/15/2018 9:45 AM

## 2018-11-15 NOTE — Progress Notes (Signed)
  [facility] Adult Case Management Discharge Plan :  Will you be returning to the same living situation after discharge:  Yes,  patient reports she is returning home with her children At discharge, do you have transportation home?: Yes,  patient reports her car is in [place] parking lot Do you have the ability to pay for your medications: Yes,  [employer] of information consent forms completed and in the chart;  Patient's signature needed at discharge.  Patient to Follow up at: Follow-up Information    Chevis Pretty, FNP Follow up on 12/24/2018.   Specialty: Family Medicine Why: Medication management with Stanton Kidney is Tuesday, 9/22 at 12:00p.  Please bring your current medications and discharge paperwork from this hospitalization.  Contact information: [place] [address] [place] Follow up.   Why: Please contact your therapist Rodman Key to schedule your next appointment, CSW unable to reach prior to discharge. Be sure to contact your provider within 3 days of discharge.  Contact information: [address] [place] [address] ph: [phone]          Next level of care provider has access to [place] and Suicide Prevention discussed: Yes,  with the patient     Has patient been referred to the Quitline?: Patient refused referral  Patient has been referred for addiction treatment: N/A  Marylee Floras, [place] 11/15/2018, 9:09 AM

## 2018-11-15 NOTE — Progress Notes (Signed)
D: Pt A & O X 3. Denies SI, HI, AVH and pain at this time. D/C home as ordered. Picked up in lobby by [facility] for drop off to [facility] to pick up her vehicle.  A: D/C instructions reviewed with pt including prescriptions and follow up appointments, compliance encouraged. All belongings from locker # 24 given to pt at time of departure. Scheduled medications given with verbal education and effects monitored. Safety checks maintained without incident till time of d/c.  R: Pt receptive to care. Compliant with medications when offered. Denies adverse drug reactions when assessed. Verbalized understanding related to d/c instructions. Signed belonging sheet in agreement with items received from locker. Ambulatory with a steady gait. Appears to be in no physical distress at time of departure.
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
## 2018-11-18 ENCOUNTER — Other Ambulatory Visit: Payer: Self-pay | Admitting: Nurse Practitioner

## 2018-11-18 NOTE — Telephone Encounter (Signed)
Pt will contact pharmacy to check on refills on file

## 2018-12-19 ENCOUNTER — Telehealth: Payer: Self-pay | Admitting: Nurse Practitioner

## 2018-12-24 ENCOUNTER — Encounter: Payer: Self-pay | Admitting: Nurse Practitioner

## 2018-12-24 ENCOUNTER — Other Ambulatory Visit: Payer: Self-pay

## 2018-12-24 ENCOUNTER — Ambulatory Visit (INDEPENDENT_AMBULATORY_CARE_PROVIDER_SITE_OTHER): Payer: Medicare Other | Admitting: Nurse Practitioner

## 2018-12-24 ENCOUNTER — Ambulatory Visit: Payer: Medicare Other | Admitting: Nurse Practitioner

## 2018-12-24 VITALS — BP 129/79 | HR 99 | Temp 97.9°F | Ht 62.0 in | Wt 128.0 lb

## 2018-12-24 DIAGNOSIS — F315 Bipolar disorder, current episode depressed, severe, with psychotic features: Secondary | ICD-10-CM | POA: Diagnosis not present

## 2018-12-24 DIAGNOSIS — F411 Generalized anxiety disorder: Secondary | ICD-10-CM

## 2018-12-24 DIAGNOSIS — F333 Major depressive disorder, recurrent, severe with psychotic symptoms: Secondary | ICD-10-CM

## 2018-12-24 MED ORDER — FLUCONAZOLE 150 MG PO TABS: ORAL_TABLET | ORAL | 0 refills | Status: DC

## 2018-12-24 MED ORDER — QUETIAPINE FUMARATE 400 MG PO TABS: 400.0000 mg | ORAL_TABLET | Freq: Every day | ORAL | 1 refills | Status: DC

## 2018-12-24 MED ORDER — CLONAZEPAM 0.5 MG PO TABS: 0.5000 mg | ORAL_TABLET | Freq: Two times a day (BID) | ORAL | 2 refills | Status: DC | PRN

## 2018-12-24 NOTE — Progress Notes (Signed)
   Subjective:    Patient ID: [name], female    DOB: 02/08/1976, 43 y.o.   MRN: [id number]   Chief Complaint: Hospitalization Follow-up   HPI Patient comes in today for follow up of admission to behavioral health on 11/12/18. She was admitted for chronic depression and opiate and xanax dependence. Patient had stoped taking her paroxetine because she did not feel that it was helping. She had gone several days without sleeping prior to her admission. She was diagnosed with major depression and bipolar disorder. She was started on seroquel 300mg   And xanax 0.5mg  BID. She was also to start trintellix 20mg  daily. She was discharged on 11/13/18. Patient says that she wanted to switch form xanax to klonopin and that Dr. Jake Samples said she could talk to me about this. She say she is doing a lot better. Still hearing voices at times but she tries to tune them out. She is still under a lot of stress at home, but she feels a lot better. She is still getting counsling with Hulen Luster   Review of Systems  Constitutional: Negative for activity change and appetite change.  HENT: Negative.   Eyes: Negative for pain.  Respiratory: Negative for shortness of breath.   Cardiovascular: Negative for chest pain, palpitations and leg swelling.  Gastrointestinal: Negative for abdominal pain.  Endocrine: Negative for polydipsia.  Genitourinary: Negative.   Skin: Negative for rash.  Neurological: Negative for dizziness, weakness and headaches.  Hematological: Does not bruise/bleed easily.  Psychiatric/Behavioral: Positive for hallucinations. Negative for suicidal ideas.  All other systems reviewed and are negative.      Objective:   Physical Exam Vitals signs and nursing note reviewed.  Constitutional:      Appearance: Normal appearance.  Cardiovascular:     Rate and Rhythm: Normal rate and regular rhythm.     Pulses: Normal pulses.     Heart sounds: Normal heart sounds.  Pulmonary:     Effort:  Pulmonary effort is normal.     Breath sounds: Normal breath sounds.  Skin:    General: Skin is warm.  Neurological:     General: No focal deficit present.     Mental Status: She is alert and oriented to person, place, and time.  Psychiatric:        Mood and Affect: Mood normal.        Behavior: Behavior normal.    BP 129/79   Pulse 99   Temp 97.9 F (36.6 C) (Temporal)   Ht 5\' 2"  (1.575 m)   Wt 128 lb (58.1 kg)   SpO2 94%   BMI 23.41 kg/m         Assessment & Plan:  [name] in today with chief complaint of Hospitalization Follow-up   1. Bipolar disorder, current episode depressed, severe, with psychotic features [facility]) Hospital records reviewed - QUEtiapine (SEROQUEL) 400 MG tablet; Take 1 tablet (400 mg total) by mouth at bedtime.  Dispense: 90 tablet; Refill: 1  2. GAD (generalized anxiety disorder) Stop xanax- changed to klonopin - clonazePAM (KLONOPIN) 0.5 MG tablet; Take 1 tablet (0.5 mg total) by mouth 2 (two) times daily as needed for anxiety.  Dispense: 60 tablet; Refill: 2  3. Severe episode of recurrent major depressive disorder, with psychotic features ([place]) Continue seroquel and trintellix as rx Continue counseling  [name], FNP

## 2018-12-24 NOTE — Patient Instructions (Signed)
Bipolar 1 Disorder Bipolar 1 disorder is a mental health disorder in which a person has episodes of emotional highs (mania), and may also have episodes of emotional lows (depression) in addition to highs. Bipolar 1 disorder is different from other bipolar disorders because it involves extreme manic episodes. These episodes last at least one week or involve symptoms that are so severe that hospitalization is needed to keep the person safe. What increases the risk? The cause of this condition is not known. However, certain factors make you more likely to have bipolar disorder, such as:  Having a family member with the disorder.  An imbalance of certain chemicals in the brain (neurotransmitters).  Stress, such as illness, financial problems, or a death.  Certain conditions that affect the brain or spinal cord (neurologic conditions).  Brain injury (trauma).  Having another mental health disorder, such as: ? Obsessive compulsive disorder. ? Schizophrenia. What are the signs or symptoms? Symptoms of mania include:  Very high self-esteem or self-confidence.  Decreased need for sleep.  Unusual talkativeness or feeling a need to keep talking. Speech may be very fast. It may seem like you cannot stop talking.  Racing thoughts or constant talking, with quick shifts between topics that may or may not be related (flight of ideas).  Decreased ability to focus or concentrate.  Increased purposeful activity, such as work, studies, or social activity.  Increased nonproductive activity. This could be pacing, squirming and fidgeting, or finger and toe tapping.  Impulsive behavior and poor judgment. This may result in high-risk activities, such as having unprotected sex or spending a lot of money. Symptoms of depression include:  Feeling sad, hopeless, or helpless.  Frequent or uncontrollable crying.  Lack of feeling or caring about anything.  Sleeping too much.  Moving more slowly than  usual.  Not being able to enjoy things you used to enjoy.  Wanting to be alone all the time.  Feeling guilty or worthless.  Lack of energy or motivation.  Trouble concentrating or remembering.  Trouble making decisions.  Increased appetite.  Thoughts of death, or the desire to harm yourself. Sometimes, you may have a mixed mood. This means having symptoms of depression and mania. Stress can make symptoms worse. How is this diagnosed? To diagnose bipolar disorder, your health care provider may ask about your:  Emotional episodes.  Medical history.  Alcohol and drug use. This includes prescription medicines. Certain medical conditions and substances can cause symptoms that seem like bipolar disorder (secondary bipolar disorder). How is this treated? Bipolar disorder is a long-term (chronic) illness. It is best controlled with ongoing (continuous) treatment rather than treatment only when symptoms occur. Treatment may include:  Medicine. Medicine can be prescribed by a provider who specializes in treating mental disorders (psychiatrist). ? Medicines called mood stabilizers are usually prescribed. ? If symptoms occur even while taking a mood stabilizer, other medicines may be added.  Psychotherapy. Some forms of talk therapy, such as cognitive-behavioral therapy (CBT), can provide support, education, and guidance.  Coping methods, such as journaling or relaxation exercises. These may include: ? Yoga. ? Meditation. ? Deep breathing.  Lifestyle changes, such as: ? Limiting alcohol and drug use. ? Exercising regularly. ? Getting plenty of sleep. ? Making healthy eating choices.  A combination of medicine, talk therapy, and coping methods is best. A procedure in which electricity is applied to the brain through the scalp (electroconvulsive therapy) may be used in cases of severe mania when medicine and psychotherapy work too  slowly or do not work. Follow these instructions at  home: Activity   Return to your normal activities as told by your health care provider.  Find activities that you enjoy, and make time to do them.  Exercise regularly as told by your health care provider. Lifestyle  Limit alcohol intake to no more than 1 drink a day for nonpregnant women and 2 drinks a day for men. One drink equals 12 oz of beer, 5 oz of wine, or 1 oz of hard liquor.  Follow a set schedule for eating and sleeping.  Eat a balanced diet that includes fresh fruits and vegetables, whole grains, low-fat dairy, and lean meat.  Get 7-8 hours of sleep each night. General instructions  Take over-the-counter and prescription medicines only as told by your health care provider.  Think about joining a support group. Your health care provider may be able to recommend a support group.  Talk with your family and loved ones about your treatment goals and how they can help.  Keep all follow-up visits as told by your health care provider. This is important. Where to find more information For more information about bipolar disorder, visit the following websites:  [employer] on Mental Illness: www.nami.[employer]: https://carter.com/ Contact a health care provider if:  Your symptoms get worse.  You have side effects from your medicine, and they get worse.  You have trouble sleeping.  You have trouble doing daily activities.  You feel unsafe in your surroundings.  You are dealing with substance abuse. Get help right away if:  You have new symptoms.  You have thoughts about harming yourself.  You self-harm. This information is not intended to replace advice given to you by your health care provider. Make sure you discuss any questions you have with your health care provider. Document Released: 06/26/2000 Document Revised: 03/02/2017 Document Reviewed: 11/18/2015 Elsevier Patient Education  2020 [employer].

## 2019-01-07 ENCOUNTER — Telehealth: Payer: Self-pay | Admitting: Nurse Practitioner

## 2019-01-07 DIAGNOSIS — R45851 Suicidal ideations: Secondary | ICD-10-CM

## 2019-01-07 NOTE — Telephone Encounter (Signed)
Patient states that yesterday she had 2 thoughts of taking her life and three times today.  Patient states it just popped in her head at random.  Patient would never take her life but does not like these thoughts. States she is no harm to herself or anyone around her.  Does medication need to be changed.?  Advised to go to ER but states she is not in harms way just wants to know if medication can be increased?

## 2019-01-07 NOTE — Telephone Encounter (Signed)
Patient called stating that she is having suicidal thoughts,  A voice telling he rto kill herself but that she would never do that. Just started this past week. Will try to get her in with psych ASAP

## 2019-01-28 ENCOUNTER — Other Ambulatory Visit: Payer: Self-pay

## 2019-01-28 ENCOUNTER — Ambulatory Visit (INDEPENDENT_AMBULATORY_CARE_PROVIDER_SITE_OTHER): Payer: Medicare Other | Admitting: Nurse Practitioner

## 2019-01-28 ENCOUNTER — Encounter: Payer: Self-pay | Admitting: Nurse Practitioner

## 2019-01-28 DIAGNOSIS — F315 Bipolar disorder, current episode depressed, severe, with psychotic features: Secondary | ICD-10-CM

## 2019-01-28 DIAGNOSIS — F411 Generalized anxiety disorder: Secondary | ICD-10-CM

## 2019-01-28 MED ORDER — FLUCONAZOLE 150 MG PO TABS: ORAL_TABLET | ORAL | 0 refills | Status: DC

## 2019-01-28 NOTE — Addendum Note (Signed)
Addended by: Chevis Pretty on: 01/28/2019 09:08 AM   Modules accepted: Orders

## 2019-01-28 NOTE — Progress Notes (Signed)
Virtual Visit via telephone Note Due to COVID-19 pandemic this visit was conducted virtually. This visit type was conducted due to national recommendations for restrictions regarding the COVID-19 Pandemic (e.g. social distancing, sheltering in place) in an effort to limit this patient's exposure and mitigate transmission in our community. All issues noted in this document were discussed and addressed.  A physical exam was not performed with this format.  I connected with [name] on 01/28/19 at 8:55 by telephone and verified that I am speaking with the correct person using two identifiers. [name] is currently located at home and no one  is currently with her during visit. The provider, [name], FNP is located in their office at time of visit.  I discussed the limitations, risks, security and privacy concerns of performing an evaluation and management service by telephone and the availability of in person appointments. I also discussed with the patient that there may be a patient responsible charge related to this service. The patient expressed understanding and agreed to proceed.   History and Present Illness:   Chief Complaint: Panic Attack   HPI Patient calls in for virtual appointment . She has been having panic attacks and issues where she is hearing voices. She has been to behavior health 2x. She is still seeing her psych physician. She is currently on  seroquel , klonopin and trintellix. She says she has not been hering voices since I last spoke with her on 01/07/19. She has stil been able to work at [employer] and that has helped her, as far as getting out of the house. She thinks she is doing. GAD 7 : Generalized Anxiety Score 01/28/2019 12/24/2018 01/01/2018 12/17/2017  Nervous, Anxious, on Edge 1 3 3 3   Control/stop worrying 1 1 3 3   Worry too much - different things 1 1 3 3   Trouble relaxing 0 1 2 3   Restless 1 0 3 2  Easily annoyed or irritable 0 1 3  2   Afraid - awful might happen 0 2 - 3  Total GAD 7 Score 4 9 - 19  Anxiety Difficulty Somewhat difficult Very difficult Very difficult Very difficult       Review of Systems  Constitutional: Negative for diaphoresis and weight loss.  Eyes: Negative for blurred vision, double vision and pain.  Respiratory: Negative for shortness of breath.   Cardiovascular: Negative for chest pain, palpitations, orthopnea and leg swelling.  Gastrointestinal: Negative for abdominal pain.  Skin: Negative for rash.  Neurological: Negative for dizziness, sensory change, loss of consciousness, weakness and headaches.  Endo/Heme/Allergies: Negative for polydipsia. Does not bruise/bleed easily.  Psychiatric/Behavioral: Negative for memory loss. The patient does not have insomnia.   All other systems reviewed and are negative.    Observations/Objective: Alert and oriented- answers all questions appropriately No distress    Assessment and Plan: [name] in today with chief complaint of Panic Attack   1. Bipolar disorder, current episode depressed, severe, with psychotic features ([place])  2. GAD (generalized anxiety disorder) Continue stress management Keep follow up with pysch  Follow Up Instructions: prn    I discussed the assessment and treatment plan with the patient. The patient was provided an opportunity to ask questions and all were answered. The patient agreed with the plan and demonstrated an understanding of the instructions.   The patient was advised to call back or seek an in-person evaluation if the symptoms worsen or if the condition fails to improve as  anticipated.  The above assessment and management plan was discussed with the patient. The patient verbalized understanding of and has agreed to the management plan. Patient is aware to call the clinic if symptoms persist or worsen. Patient is aware when to return to the clinic for a follow-up visit. Patient educated on when  it is appropriate to go to the emergency department.   Time call ended:  9:10  I provided 15 minutes of non-face-to-face time during this encounter.    [name], FNP

## 2019-02-17 ENCOUNTER — Other Ambulatory Visit: Payer: Self-pay

## 2019-02-17 ENCOUNTER — Other Ambulatory Visit: Payer: Self-pay | Admitting: Nurse Practitioner

## 2019-02-17 ENCOUNTER — Encounter: Payer: Self-pay | Admitting: Nurse Practitioner

## 2019-02-17 ENCOUNTER — Ambulatory Visit (INDEPENDENT_AMBULATORY_CARE_PROVIDER_SITE_OTHER): Payer: Medicare Other | Admitting: Nurse Practitioner

## 2019-02-17 DIAGNOSIS — F411 Generalized anxiety disorder: Secondary | ICD-10-CM

## 2019-02-17 MED ORDER — ALPRAZOLAM 0.5 MG PO TABS: 0.5000 mg | ORAL_TABLET | Freq: Two times a day (BID) | ORAL | 1 refills | Status: DC | PRN

## 2019-02-17 NOTE — Progress Notes (Signed)
Virtual Visit via telephone Note Due to COVID-19 pandemic this visit was conducted virtually. This visit type was conducted due to national recommendations for restrictions regarding the COVID-19 Pandemic (e.g. social distancing, sheltering in place) in an effort to limit this patient's exposure and mitigate transmission in our community. All issues noted in this document were discussed and addressed.  A physical exam was not performed with this format.  I connected with [name] on 02/17/19 at 8:25 by telephone and verified that I am speaking with the correct person using two identifiers. [name] is currently located at home and her son  is currently with her during visit. The provider, [name], FNP is located in their office at time of visit.  I discussed the limitations, risks, security and privacy concerns of performing an evaluation and management service by telephone and the availability of in person appointments. I also discussed with the patient that there may be a patient responsible charge related to this service. The patient expressed understanding and agreed to proceed.   History and Present Illness:  [name] in today with chief complaint of Panic Attack   Patient calls in today c/o anxiety and panic attacks. She is having problems with her son and her husband do not get along and she has had to call the police to her home because they were fighting. Her daughter has a boyfriend and is never home. Patient is working and she says that has increased her anxiety. She is currently not seeing psych. She cannot see them right now because she cannot afford to go until the first of the month. She is taking klonopin bid and she says that is no longer helping her. She is nervous and anxious all the time. She would like to go back on her xanax.    Review of Systems  Constitutional: Negative for diaphoresis and weight loss.  Eyes: Negative for blurred  vision, double vision and pain.  Respiratory: Negative for shortness of breath.   Cardiovascular: Negative for chest pain, palpitations, orthopnea and leg swelling.  Gastrointestinal: Negative for abdominal pain.  Skin: Negative for rash.  Neurological: Negative for dizziness, sensory change, loss of consciousness, weakness and headaches.  Endo/Heme/Allergies: Negative for polydipsia. Does not bruise/bleed easily.  Psychiatric/Behavioral: Negative for memory loss. The patient is nervous/anxious. The patient does not have insomnia.   All other systems reviewed and are negative.    Observations/Objective: Alert and oriented- answers all questions appropriately moderately distress Tearful     Assessment and Plan: [name] in today with chief complaint of Panic Attack   1. GAD (generalized anxiety disorder) Stress management MUST see your psych doctor. CANNOT give higher dose of  xanax- ALPRAZolam (XANAX) 0.5 MG tablet; Take 1 tablet (0.5 mg total) by mouth 2 (two) times daily as needed for anxiety.  Dispense: 60 tablet; Refill: 1   Follow Up Instructions: 2 months    I discussed the assessment and treatment plan with the patient. The patient was provided an opportunity to ask questions and all were answered. The patient agreed with the plan and demonstrated an understanding of the instructions.   The patient was advised to call back or seek an in-person evaluation if the symptoms worsen or if the condition fails to improve as anticipated.  The above assessment and management plan was discussed with the patient. The patient verbalized understanding of and has agreed to the management plan. Patient is aware to call the clinic if  symptoms persist or worsen. Patient is aware when to return to the clinic for a follow-up visit. Patient educated on when it is appropriate to go to the emergency department.   Time call ended:  8:38  I provided 13 minutes of non-face-to-face time  during this encounter.    [name], FNP

## 2019-02-17 NOTE — Addendum Note (Signed)
Addended by: Chevis Pretty on: 02/17/2019 09:06 AM   Modules accepted: Orders

## 2019-03-04 ENCOUNTER — Ambulatory Visit (INDEPENDENT_AMBULATORY_CARE_PROVIDER_SITE_OTHER): Payer: Medicare Other | Admitting: Family Medicine

## 2019-03-04 ENCOUNTER — Encounter: Payer: Self-pay | Admitting: Family Medicine

## 2019-03-04 ENCOUNTER — Other Ambulatory Visit: Payer: Self-pay

## 2019-03-04 DIAGNOSIS — Z7189 Other specified counseling: Secondary | ICD-10-CM | POA: Diagnosis not present

## 2019-03-04 DIAGNOSIS — J069 Acute upper respiratory infection, unspecified: Secondary | ICD-10-CM | POA: Diagnosis not present

## 2019-03-04 DIAGNOSIS — Z20822 Contact with and (suspected) exposure to covid-19: Secondary | ICD-10-CM

## 2019-03-04 NOTE — Patient Instructions (Signed)
Prevent the Spread of COVID-19 if You Are Sick If you are sick with COVID-19 or think you might have COVID-19, follow the steps below to help protect other people in your home and community. Stay home except to get medical care.  Stay home. Most people with COVID-19 have mild illness and are able to recover at home without medical care. Do not leave your home, except to get medical care. Do not visit public areas.  Take care of yourself. Get rest and stay hydrated.  Get medical care when needed. Call your doctor before you go to their office for care. But, if you have trouble breathing or other concerning symptoms, call 911 for immediate help.  Avoid public transportation, ride-sharing, or taxis. Separate yourself from other people and pets in your home.  As much as possible, stay in a specific room and away from other people and pets in your home. Also, you should use a separate bathroom, if available. If you need to be around other people or animals in or outside of the home, wear a cloth face covering. ? See COVID-19 and Animals if you have questions about pets: https://www.cdc.gov/coronavirus/2019-ncov/faq.html#COVID19animals Monitor your symptoms.  Common symptoms of COVID-19 include fever and cough. Trouble breathing is a more serious symptom that means you should get medical attention.  Follow care instructions from your healthcare provider and local health department. Your local health authorities will give instructions on checking your symptoms and reporting information. If you develop emergency warning signs for COVID-19 get medical attention immediately.  Emergency warning signs include*:  Trouble breathing  Persistent pain or pressure in the chest  New confusion or not able to be woken  Bluish lips or face *This list is not all inclusive. Please consult your medical provider for any other symptoms that are severe or concerning to you. Call 911 if you have a medical  emergency. If you have a medical emergency and need to call 911, notify the operator that you have or think you might have, COVID-19. If possible, put on a facemask before medical help arrives. Call ahead before visiting your doctor.  Call ahead. Many medical visits for routine care are being postponed or done by phone or telemedicine.  If you have a medical appointment that cannot be postponed, call your doctor's office. This will help the office protect themselves and other patients. If you are sick, wear a cloth covering over your nose and mouth.  You should wear a cloth face covering over your nose and mouth if you must be around other people or animals, including pets (even at home).  You don't need to wear the cloth face covering if you are alone. If you can't put on a cloth face covering (because of trouble breathing for example), cover your coughs and sneezes in some other way. Try to stay at least 6 feet away from other people. This will help protect the people around you. Note: During the COVID-19 pandemic, medical grade facemasks are reserved for healthcare workers and some first responders. You may need to make a cloth face covering using a scarf or bandana. Cover your coughs and sneezes.  Cover your mouth and nose with a tissue when you cough or sneeze.  Throw used tissues in a lined trash can.  Immediately wash your hands with soap and water for at least 20 seconds. If soap and water are not available, clean your hands with an alcohol-based hand sanitizer that contains at least 60% alcohol. Clean your hands often.    Wash your hands often with soap and water for at least 20 seconds. This is especially important after blowing your nose, coughing, or sneezing; going to the bathroom; and before eating or preparing food.  Use hand sanitizer if soap and water are not available. Use an alcohol-based hand sanitizer with at least 60% alcohol, covering all surfaces of your hands and rubbing  them together until they feel dry.  Soap and water are the best option, especially if your hands are visibly dirty.  Avoid touching your eyes, nose, and mouth with unwashed hands. Avoid sharing personal household items.  Do not share dishes, drinking glasses, cups, eating utensils, towels, or bedding with other people in your home.  Wash these items thoroughly after using them with soap and water or put them in the dishwasher. Clean all "high-touch" surfaces everyday.  Clean and disinfect high-touch surfaces in your "sick room" and bathroom. Let someone else clean and disinfect surfaces in common areas, but not your bedroom and bathroom.  If a caregiver or other person needs to clean and disinfect a sick person's bedroom or bathroom, they should do so on an as-needed basis. The caregiver/other person should wear a mask and wait as long as possible after the sick person has used the bathroom. High-touch surfaces include phones, remote controls, counters, tabletops, doorknobs, bathroom fixtures, toilets, keyboards, tablets, and bedside tables.  Clean and disinfect areas that may have blood, stool, or body fluids on them.  Use household cleaners and disinfectants. Clean the area or item with soap and water or another detergent if it is dirty. Then use a household disinfectant. ? Be sure to follow the instructions on the label to ensure safe and effective use of the product. Many products recommend keeping the surface wet for several minutes to ensure germs are killed. Many also recommend precautions such as wearing gloves and making sure you have good ventilation during use of the product. ? Most EPA-registered household disinfectants should be effective. How to discontinue home isolation  People with COVID-19 who have stayed home (home isolated) can stop home isolation under the following conditions: ? If you will not have a test to determine if you are still contagious, you can leave home  after these three things have happened:  You have had no fever for at least 72 hours (that is three full days of no fever without the use of medicine that reduces fevers) AND  other symptoms have improved (for example, when your cough or shortness of breath has improved) AND  at least 10 days have passed since your symptoms first appeared. ? If you will be tested to determine if you are still contagious, you can leave home after these three things have happened:  You no longer have a fever (without the use of medicine that reduces fevers) AND  other symptoms have improved (for example, when your cough or shortness of breath has improved) AND  you received two negative tests in a row, 24 hours apart. Your doctor will follow CDC guidelines. In all cases, follow the guidance of your healthcare provider and local health department. The decision to stop home isolation should be made in consultation with your healthcare provider and state and local health departments. Local decisions depend on local circumstances. cdc.gov/coronavirus 08/04/2018 This information is not intended to replace advice given to you by your health care provider. Make sure you discuss any questions you have with your health care provider. Document Released: 07/16/2018 Document Revised: 08/14/2018 Document Reviewed: 07/16/2018   Elsevier Patient Education  2020 [employer]  

## 2019-03-04 NOTE — Progress Notes (Signed)
Telephone visit  Subjective: CC: URI PCP: Chevis Pretty, FNP [id number] [name] is a 43 y.o. female calls for telephone consult today. Patient provides verbal consent for consult held via phone.  Location of patient: home Location of provider: [facility] Others present for call: daughter  1. URI Patient reports onset 3-4 days ago.  She reports chest congestion and cough but cannot get up and phlegm.  She reports sinus pressure.  Denies fever, nausea, vomiting, diarrhea, shortness of breath.  Denies purulence from nares. She has been using robitussin, OTC cough and cold medication.  Her daughter is sick with similar.  She notes that she works at [employer] but they always use [employer].  No known sick contacts outside of her daughter.  No known COVID-19 exposure.  ROS: Per HPI  Allergies  Allergen Reactions  . Amoxicillin Nausea And Vomiting  . Chantix [Varenicline] Other (See Comments)    Causes bad nightmares, hallucinations  . Codeine Other (See Comments)    Shaking   . Divalproex Sodium Other (See Comments)    sedation  . Lithium Other (See Comments)    Drowsiness, tremors drowsiness  . Propoxyphene Nausea And Vomiting  . Augmentin [Amoxicillin-Pot Clavulanate] Diarrhea and Other (See Comments)  . Zolpidem Other (See Comments)    Patient states it does not agree with her body.    Past Medical History:  Diagnosis Date  . Anxiety   . Back pain   . IBS (irritable bowel syndrome)     Current Outpatient Medications:  .  ALPRAZolam (XANAX) 0.5 MG tablet, Take 1 tablet (0.5 mg total) by mouth 2 (two) times daily as needed for anxiety., Disp: 60 tablet, Rfl: 1 .  baclofen (LIORESAL) 10 MG tablet, TK 1 T PO TID PRN, Disp: , Rfl:  .  Calcium Polycarbophil (FIBER-CAPS PO), Take 1 capsule by mouth daily. , Disp: , Rfl:  .  cetirizine (ZYRTEC) 10 MG tablet, Take 1 tablet (10 mg total) by mouth daily. (Patient taking differently: Take 10 mg by mouth daily as needed for allergies. ),  Disp: 30 tablet, Rfl: 11 .  fluconazole (DIFLUCAN) 150 MG tablet, 1 po q week x 4 weeks, Disp: 4 tablet, Rfl: 0 .  HYDROcodone-acetaminophen (NORCO) 10-325 MG tablet, Take 1 tablet by mouth every 6 (six) hours as needed for moderate pain (For Neck and Back Pain)., Disp: , Rfl:  .  lisinopril (ZESTRIL) 10 MG tablet, Take 1 tablet (10 mg total) by mouth daily. (Patient taking differently: Take 10 mg by mouth daily as needed. For high blood pressure), Disp: 90 tablet, Rfl: 1 .  omeprazole (PRILOSEC) 20 MG capsule, Take 1 capsule (20 mg total) by mouth daily., Disp: 90 capsule, Rfl: 1 .  Prenat Vit-Fe Gly Cys-FA-Omega (ENBRACE HR) CAPS, Take 1 capsule by mouth daily., Disp: 30 capsule, Rfl: 11 .  QUEtiapine (SEROQUEL) 400 MG tablet, Take 1 tablet (400 mg total) by mouth at bedtime., Disp: 90 tablet, Rfl: 1 .  vortioxetine HBr (TRINTELLIX) 20 MG TABS tablet, Take 1 tablet (20 mg total) by mouth daily., Disp: 90 tablet, Rfl: 1  Assessment/ Plan: 43 y.o. female   1. URI with cough and congestion Likely viral in nature.  Nothing to suggest bacterial infection at this time.  I have recommended that she be tested for COVID-19, particularly since she is engaged with the public.  We discussed red flag signs and symptoms warranting further evaluation.  In the interim, I have advised her to consider pseudoephedrine for sinus congestion  over phenylephrine as the phenylephrine will likely increase her blood pressure quite a bit more than the pseudoephedrine.  She unfortunately is unable to tolerate saline sprays, nasal rinses or nasal sprays.  Advised to shelter in place until she can get the result of her test and or is asymptomatic without medication.  A work note has been provided and placed upfront for a family member to retrieve for her.  2. Advice given about COVID-19 virus by telephone   Start time: [name] End time: 8:34am  Total time spent on patient care (including telephone call/ virtual visit): 10  minutes  [place], [place] [phone]

## 2019-03-06 ENCOUNTER — Telehealth: Payer: Self-pay | Admitting: Nurse Practitioner

## 2019-03-06 ENCOUNTER — Encounter: Payer: Self-pay | Admitting: Family Medicine

## 2019-03-06 LAB — NOVEL CORONAVIRUS, NAA: SARS-CoV-2, NAA: NOT DETECTED

## 2019-03-06 NOTE — Telephone Encounter (Signed)
Patient aware note ready

## 2019-03-06 NOTE — Telephone Encounter (Signed)
Ok for note 

## 2019-03-06 NOTE — Telephone Encounter (Signed)
Ok for note to return to work. 

## 2019-03-13 ENCOUNTER — Other Ambulatory Visit: Payer: Self-pay

## 2019-03-13 ENCOUNTER — Encounter: Payer: Self-pay | Admitting: Nurse Practitioner

## 2019-03-13 ENCOUNTER — Ambulatory Visit (INDEPENDENT_AMBULATORY_CARE_PROVIDER_SITE_OTHER): Payer: Medicare Other | Admitting: Nurse Practitioner

## 2019-03-13 DIAGNOSIS — N3000 Acute cystitis without hematuria: Secondary | ICD-10-CM | POA: Diagnosis not present

## 2019-03-13 MED ORDER — NITROFURANTOIN MONOHYD MACRO 100 MG PO CAPS: 100.0000 mg | ORAL_CAPSULE | Freq: Two times a day (BID) | ORAL | 0 refills | Status: DC

## 2019-03-13 NOTE — Progress Notes (Signed)
   Virtual Visit via telephone Note Due to COVID-19 pandemic this visit was conducted virtually. This visit type was conducted due to national recommendations for restrictions regarding the COVID-19 Pandemic (e.g. social distancing, sheltering in place) in an effort to limit this patient's exposure and mitigate transmission in our community. All issues noted in this document were discussed and addressed.  A physical exam was not performed with this format.  I connected with [name] on 03/13/19 at 12:50 by telephone and verified that I am speaking with the correct person using two identifiers. [name] is currently located at home and   No one is currently with her during visit. The provider, [name], FNP is located in their office at time of visit.  I discussed the limitations, risks, security and privacy concerns of performing an evaluation and management service by telephone and the availability of in person appointments. I also discussed with the patient that there may be a patient responsible charge related to this service. The patient expressed understanding and agreed to proceed.   History and Present Illness:   Chief Complaint: Urinary Tract Infection   HPI Patient calls in c/o dysuria and urinary frequency with scant amounts. Started yesterday. Was up most of night with symptoms. Urine has strong smell.   Review of Systems  Constitutional: Negative for diaphoresis and weight loss.  Eyes: Negative for blurred vision, double vision and pain.  Respiratory: Negative for shortness of breath.   Cardiovascular: Negative for chest pain, palpitations, orthopnea and leg swelling.  Gastrointestinal: Negative for abdominal pain.  Skin: Negative for rash.  Neurological: Negative for dizziness, sensory change, loss of consciousness, weakness and headaches.  Endo/Heme/Allergies: Negative for polydipsia. Does not bruise/bleed easily.  Psychiatric/Behavioral:  Negative for memory loss. The patient does not have insomnia.   All other systems reviewed and are negative.    Observations/Objective: Alert and oriented- answers all questions appropriately No distress    Assessment and Plan: [name] in today with chief complaint of Urinary Tract Infection   1. Acute cystitis without hematuria Take medication as prescribe Cotton underwear Take shower not bath Cranberry juice, yogurt Force fluids AZO over the counter X2 days Culture pending RTO prn  - nitrofurantoin, macrocrystal-monohydrate, (MACROBID) 100 MG capsule; Take 1 capsule (100 mg total) by mouth 2 (two) times daily. 1 po BId  Dispense: 10 capsule; Refill: 0   Follow Up Instructions: prn    I discussed the assessment and treatment plan with the patient. The patient was provided an opportunity to ask questions and all were answered. The patient agreed with the plan and demonstrated an understanding of the instructions.   The patient was advised to call back or seek an in-person evaluation if the symptoms worsen or if the condition fails to improve as anticipated.  The above assessment and management plan was discussed with the patient. The patient verbalized understanding of and has agreed to the management plan. Patient is aware to call the clinic if symptoms persist or worsen. Patient is aware when to return to the clinic for a follow-up visit. Patient educated on when it is appropriate to go to the emergency department.   Time call ended:  1:00  I provided 10 minutes of non-face-to-face time during this encounter.    [name], FNP

## 2019-03-16 ENCOUNTER — Other Ambulatory Visit: Payer: Self-pay | Admitting: Family

## 2019-03-16 MED ORDER — SULFAMETHOXAZOLE-TRIMETHOPRIM 800-160 MG PO TABS: 1.0000 | ORAL_TABLET | Freq: Two times a day (BID) | ORAL | 0 refills | Status: DC

## 2019-03-16 MED ORDER — FLUCONAZOLE 150 MG PO TABS: 150.0000 mg | ORAL_TABLET | ORAL | 0 refills | Status: DC | PRN

## 2019-03-16 NOTE — Progress Notes (Signed)
PT calls with worsening UTI symptoms. Pt had a telephone visit on 03/13/19 and was given Macrobid. Her symptoms have not improved and worsen. Will give Bactrim. She will follow up on Monday.

## 2019-03-17 ENCOUNTER — Telehealth: Payer: Self-pay | Admitting: Nurse Practitioner

## 2019-03-17 DIAGNOSIS — F315 Bipolar disorder, current episode depressed, severe, with psychotic features: Secondary | ICD-10-CM

## 2019-03-17 NOTE — Telephone Encounter (Signed)
Pt is calling back requesting that either [name] or nurse calls her asap regarding her previous call about getting more sleeping pills sent to her pharmacy. Call patient at [phone]

## 2019-03-18 NOTE — Telephone Encounter (Signed)
Sh ewas just treated for UTI- needs to bring in urine specimen. Also , cannot take more meds then rx. Cannot get refill on sleeping meds until it has been a month. On to many others meds to be taking extra sleeping pills.

## 2019-03-18 NOTE — Telephone Encounter (Signed)
Patient is asking if she can try a different sleeping pill can not take ambein.

## 2019-03-18 NOTE — Telephone Encounter (Signed)
Pt is asking to speak directly to mmm

## 2019-03-19 ENCOUNTER — Other Ambulatory Visit: Payer: Medicare Other

## 2019-03-19 ENCOUNTER — Other Ambulatory Visit: Payer: Self-pay

## 2019-03-19 ENCOUNTER — Telehealth: Payer: Self-pay | Admitting: Nurse Practitioner

## 2019-03-19 DIAGNOSIS — R300 Dysuria: Secondary | ICD-10-CM

## 2019-03-19 LAB — URINALYSIS, COMPLETE
Bilirubin, UA: NEGATIVE
Glucose, UA: NEGATIVE
Ketones, UA: NEGATIVE
Leukocytes,UA: NEGATIVE
Nitrite, UA: NEGATIVE
Protein,UA: NEGATIVE
RBC, UA: NEGATIVE
Specific Gravity, UA: 1.025 (ref 1.005–1.030)
Urobilinogen, Ur: 0.2 mg/dL (ref 0.2–1.0)
pH, UA: 6.5 (ref 5.0–7.5)

## 2019-03-19 LAB — MICROSCOPIC EXAMINATION
Epithelial Cells (non renal): >10 /hpf — AB (ref 0–10)
RBC, Urine: NONE SEEN /hpf (ref 0–2)
Renal Epithel, UA: NONE SEEN /hpf

## 2019-03-19 NOTE — Telephone Encounter (Signed)
Patient says she has called about her sleep medication and received a call back about changing the Seroquel. Patient said she would like to speak with nurse about the medications she can't take. Wants to be called at home and if she doesn't answer, leave a message. (call around 10:00 AM)

## 2019-03-19 NOTE — Telephone Encounter (Signed)
If she was not instructed to increase the medication to this dosage by her PCP, she should not be taking more than prescribed.

## 2019-03-19 NOTE — Telephone Encounter (Signed)
Vm NOT SET UP

## 2019-03-19 NOTE — Telephone Encounter (Signed)
Patient was given Seroquel on 9/22 #90- directions say to take one daily at bedtime.  Patient states that she was taking it one daily at bedtime but then it started not working and states she went up to 1 1/2 pills at bedtime. Now patient has ran out of her medication.  Covering PCP please advise

## 2019-03-20 ENCOUNTER — Encounter: Payer: Self-pay | Admitting: Nurse Practitioner

## 2019-03-20 ENCOUNTER — Ambulatory Visit (INDEPENDENT_AMBULATORY_CARE_PROVIDER_SITE_OTHER): Payer: Medicare Other | Admitting: Nurse Practitioner

## 2019-03-20 DIAGNOSIS — F5101 Primary insomnia: Secondary | ICD-10-CM | POA: Diagnosis not present

## 2019-03-20 LAB — URINE CULTURE: Organism ID, Bacteria: NO GROWTH

## 2019-03-20 MED ORDER — QUETIAPINE FUMARATE 400 MG PO TABS: 800.0000 mg | ORAL_TABLET | Freq: Every day | ORAL | 1 refills | Status: DC

## 2019-03-20 NOTE — Telephone Encounter (Signed)
Increased seroquel to 2 po QHs

## 2019-03-20 NOTE — Progress Notes (Signed)
   Virtual Visit via telephone Note Due to COVID-19 pandemic this visit was conducted virtually. This visit type was conducted due to national recommendations for restrictions regarding the COVID-19 Pandemic (e.g. social distancing, sheltering in place) in an effort to limit this patient's exposure and mitigate transmission in our community. All issues noted in this document were discussed and addressed.  A physical exam was not performed with this format.  I connected with [name] on 03/20/19 at 3:10 by telephone and verified that I am speaking with the correct person using two identifiers. [name] is currently located at home and no one  is currently with her during visit. The provider, [name], FNP is located in their office at time of visit.  I discussed the limitations, risks, security and privacy concerns of performing an evaluation and management service by telephone and the availability of in person appointments. I also discussed with the patient that there may be a patient responsible charge related to this service. The patient expressed understanding and agreed to proceed.   History and Present Illness:   Chief Complaint: Insomnia   HPI Patient calls in saying that she has not been sleeping. She has been on seroquel 400mg  nightly for sleep. She said it had not been working as well so she increased it by 1/2 tablet daily. She has ran out of meds and needs refill with dose change.   Review of Systems  Constitutional: Negative for diaphoresis and weight loss.  Eyes: Negative for blurred vision, double vision and pain.  Respiratory: Negative for shortness of breath.   Cardiovascular: Negative for chest pain, palpitations, orthopnea and leg swelling.  Gastrointestinal: Negative for abdominal pain.  Skin: Negative for rash.  Neurological: Negative for dizziness, sensory change, loss of consciousness, weakness and headaches.  Endo/Heme/Allergies:  Negative for polydipsia. Does not bruise/bleed easily.  Psychiatric/Behavioral: Negative for memory loss. The patient does not have insomnia.   All other systems reviewed and are negative.    Observations/Objective: Alert and oriented- answers all questions appropriately No distress    Assessment and Plan: [name] in today with chief complaint of Insomnia   1. Primary insomnia seroquel 400mg  1 1/2 - 2 po qhs. Bedtime nroutine   Follow Up Instructions: prn    I discussed the assessment and treatment plan with the patient. The patient was provided an opportunity to ask questions and all were answered. The patient agreed with the plan and demonstrated an understanding of the instructions.   The patient was advised to call back or seek an in-person evaluation if the symptoms worsen or if the condition fails to improve as anticipated.  The above assessment and management plan was discussed with the patient. The patient verbalized understanding of and has agreed to the management plan. Patient is aware to call the clinic if symptoms persist or worsen. Patient is aware when to return to the clinic for a follow-up visit. Patient educated on when it is appropriate to go to the emergency department.   Time call ended:  3:20  I provided 10 minutes of non-face-to-face time during this encounter.    [name], FNP

## 2019-03-24 ENCOUNTER — Telehealth: Payer: Self-pay | Admitting: Nurse Practitioner

## 2019-03-24 NOTE — Telephone Encounter (Signed)
NA on home or cell

## 2019-03-26 NOTE — Telephone Encounter (Signed)
Patient seen 12/17

## 2019-04-02 ENCOUNTER — Ambulatory Visit (INDEPENDENT_AMBULATORY_CARE_PROVIDER_SITE_OTHER): Payer: Medicare Other | Admitting: *Deleted

## 2019-04-02 ENCOUNTER — Other Ambulatory Visit: Payer: Self-pay | Admitting: Family Medicine

## 2019-04-02 DIAGNOSIS — Z0000 Encounter for general adult medical examination without abnormal findings: Secondary | ICD-10-CM

## 2019-04-02 DIAGNOSIS — R112 Nausea with vomiting, unspecified: Secondary | ICD-10-CM

## 2019-04-02 NOTE — Patient Instructions (Signed)

## 2019-04-02 NOTE — Progress Notes (Addendum)
MEDICARE ANNUAL WELLNESS VISIT  04/02/2019  Telephone Visit Disclaimer This Medicare AWV was conducted by telephone due to national recommendations for restrictions regarding the COVID-19 Pandemic (e.g. social distancing).  I verified, using two identifiers, that I am speaking with [name] or their authorized healthcare agent. I discussed the limitations, risks, security, and privacy concerns of performing an evaluation and management service by telephone and the potential availability of an in-person appointment in the future. The patient expressed understanding and agreed to proceed.   Subjective:  [name] is a 43 y.o. female patient of [name], [place] who had a Medicare Annual Wellness Visit today via telephone. [name] is Working part time and lives with their family. she has 3 children. she reports that she is socially active and does interact with friends/family regularly. she is minimally physically active and enjoys socializing, going to movies and bowling.  Patient Care Team: Nicole Pretty, FNP as PCP - General (Family Medicine)  Advanced Directives 04/02/2019 11/12/2018 08/19/2018 07/22/2018 02/07/2016 01/13/2015 04/01/2014  Does Patient Have a Medical Advance Directive? No No No No No No No  Would patient like information on creating a medical advance directive? No - Patient declined - No - Patient declined Yes (ED - Information included in AVS) - - No - patient declined information  Some encounter information is confidential and restricted. Go to Review Flowsheets activity to see all data.    Hospital Utilization Over the Past 12 Months: # of hospitalizations or ER visits: 4 # of surgeries: 0  Review of Systems    Patient reports that her overall health is better compared to last year.  History obtained from chart review  Patient Reported Readings (BP, Pulse, CBG, Weight, etc) none  Pain Assessment Pain : 0-10 Pain Score: 6   Pain Type: Chronic pain Pain Location: Neck Pain Orientation: Right Pain Onset: More than a month ago Pain Frequency: Constant Effect of Pain on Daily Activities: "works through the pain"     Current Medications & Allergies (verified) Allergies as of 04/02/2019       Reactions   Amoxicillin Nausea And Vomiting   Chantix [varenicline] Other (See Comments)   Causes bad nightmares, hallucinations   Codeine Other (See Comments)   Shaking    Divalproex Sodium Other (See Comments)   sedation   Lithium Other (See Comments)   Drowsiness, tremors drowsiness   Propoxyphene Nausea And Vomiting   Augmentin [amoxicillin-pot Clavulanate] Diarrhea, Other (See Comments)   Zolpidem Other (See Comments)   Patient states it does not agree with her body.         Medication List        Accurate as of April 02, 2019  9:04 AM. If you have any questions, ask your nurse or doctor.          STOP taking these medications    EnBrace HR Caps   fluconazole 150 MG tablet Commonly known as: DIFLUCAN   nitrofurantoin (macrocrystal-monohydrate) 100 MG capsule Commonly known as: Macrobid   omeprazole 20 MG capsule Commonly known as: PRILOSEC   sulfamethoxazole-trimethoprim 800-160 MG tablet Commonly known as: Bactrim DS       TAKE these medications    ALPRAZolam 0.5 MG tablet Commonly known as: Xanax Take 1 tablet (0.5 mg total) by mouth 2 (two) times daily as needed for anxiety.   baclofen 10 MG tablet Commonly known as: LIORESAL TK 1 T PO TID PRN   cetirizine 10 MG tablet Commonly  known as: ZYRTEC Take 1 tablet (10 mg total) by mouth daily. What changed:  when to take this reasons to take this   FIBER-CAPS PO Take 1 capsule by mouth daily.   HYDROcodone-acetaminophen 10-325 MG tablet Commonly known as: NORCO Take 1 tablet by mouth every 6 (six) hours as needed for moderate pain (For Neck and Back Pain).   lisinopril 10 MG tablet Commonly known as: ZESTRIL Take  1 tablet (10 mg total) by mouth daily. What changed:  when to take this reasons to take this additional instructions   QUEtiapine 400 MG tablet Commonly known as: SEROquel Take 2 tablets (800 mg total) by mouth at bedtime.   vortioxetine HBr 20 MG Tabs tablet Commonly known as: TRINTELLIX Take 1 tablet (20 mg total) by mouth daily.        History (reviewed): Past Medical History:  Diagnosis Date   Anxiety    Back pain    IBS (irritable bowel syndrome)    Past Surgical History:  Procedure Laterality Date   ABDOMINAL HYSTERECTOMY     BLADDER SURGERY  2015   CHOLECYSTECTOMY     Family History  Problem Relation Age of Onset   Renal Disease Mother    Heart attack Father    Social History   Socioeconomic History   Marital status: Married    Spouse name: Not on file   Number of children: 3   Years of education: 11   Highest education level: GED or equivalent  Occupational History   Not on file  Tobacco Use   Smoking status: Current Every Day Smoker    Packs/day: 1.00    Types: Cigarettes   Smokeless tobacco: Never Used  Substance and Sexual Activity   Alcohol use: No   Drug use: No   Sexual activity: Not Currently    Birth control/protection: Surgical  Other Topics Concern   Not on file  Social History Narrative   Not on file   Social Determinants of Health   Financial Resource Strain: Low Risk    Difficulty of Paying Living Expenses: Not very hard  Food Insecurity: No Food Insecurity   Worried About Running Out of Food in the Last Year: Never true   Ran Out of Food in the Last Year: Never true  Transportation Needs: No Transportation Needs   Lack of Transportation (Medical): No   Lack of Transportation (Non-Medical): No  Physical Activity: Inactive   Days of Exercise per Week: 0 days   Minutes of Exercise per Session: 0 min  Stress: No Stress Concern Present   Feeling of Stress : Only a little  Social Connections: Moderately Isolated   Frequency  of Communication with Friends and Family: More than three times a week   Frequency of Social Gatherings with Friends and Family: More than three times a week   Attends Religious Services: Never   Marine scientist or Organizations: No   Attends Archivist Meetings: Never   Marital Status: Divorced    Activities of Daily Living In your present state of health, do you have any difficulty performing the following activities: 04/02/2019 08/19/2018  Hearing? N N  Vision? N N  Comment wears glasses all the time-gets yearly eye exam -  Difficulty concentrating or making decisions? N N  Walking or climbing stairs? N Y  Comment - secondary to weakness  Dressing or bathing? N N  Doing errands, shopping? N N  Preparing Food and eating ? N -  Using  the Toilet? N -  In the past six months, have you accidently leaked urine? Y -  Comment not often -  Do you have problems with loss of bowel control? N -  Managing your Medications? N -  Managing your Finances? N -  Housekeeping or managing your Housekeeping? N -  Some encounter information is confidential and restricted. Go to Review Flowsheets activity to see all data.  Some recent data might be hidden    Patient Education/ Literacy What is the last grade level you completed in school?: 10th grade-then went back and got her GED  Exercise Current Exercise Habits: The patient does not participate in regular exercise at present, Exercise limited by: orthopedic condition(s)  Diet Patient reports consuming 2 meals a day and 2 snack(s) a day Patient reports that her primary diet is: Regular Patient reports that she does have regular access to food.   Depression Screen PHQ 2/9 Scores 04/02/2019 12/24/2018 08/23/2018 06/11/2018 04/25/2018 04/08/2018 12/17/2017  PHQ - 2 Score 1 4 1 6  0 0 1  PHQ- 9 Score - 8 - 21 - - -     Fall Risk Fall Risk  04/02/2019 04/25/2018 11/05/2017  Falls in the past year? 0 0 No     Objective:  [name] seemed alert and oriented and she participated appropriately during our telephone visit.  Blood Pressure Weight BMI  BP Readings from Last 3 Encounters:  12/24/18 129/79  11/13/18 116/67  10/10/18 99/76   Wt Readings from Last 3 Encounters:  12/24/18 128 lb (58.1 kg)  10/10/18 123 lb (55.8 kg)  08/23/18 133 lb 6.4 oz (60.5 kg)   BMI Readings from Last 1 Encounters:  12/24/18 23.41 kg/m    *Unable to obtain current vital signs, weight, and BMI due to telephone visit type  Hearing/Vision  [name] did not seem to have difficulty with hearing/understanding during the telephone conversation Reports that she has had a formal eye exam by an eye care professional within the past year Reports that she has not had a formal hearing evaluation within the past year *Unable to fully assess hearing and vision during telephone visit type  Cognitive Function: 6CIT Screen 04/02/2019  What Year? 0 points  What month? 0 points  What time? 0 points  Count back from 20 0 points  Months in reverse 0 points  Repeat phrase 0 points  Total Score 0   (Normal:0-7, Significant for Dysfunction: >8)  Normal Cognitive Function Screening: Yes   Immunization & Health Maintenance Record  There is no immunization history on file for this patient.  Health Maintenance  Topic Date Due   TETANUS/TDAP  04/23/2019 (Originally 05/[birth year]/1994)   PAP SMEAR-Modifier  04/25/2021   HIV Screening  Completed   INFLUENZA VACCINE  Discontinued       Assessment  This is a routine wellness examination for [name].  Health Maintenance: Due or Overdue There are no preventive care reminders to display for this patient.  [name] does not need a referral for Community Assistance: Care Management:   no Social Work:    no Prescription Assistance:  no Nutrition/Diabetes Education:  no   Plan:  Personalized Goals Goals Addressed             This Visit's Progress    DIET - INCREASE  WATER INTAKE       Try to drink 6-8 glasses of water daily       Personalized Health Maintenance & Screening  Recommendations  Influenza vaccine Td vaccine  Lung Cancer Screening Recommended: no (Low Dose CT Chest recommended if Age 24-80 years, 30 pack-year currently smoking OR have quit w/in past 15 years) Hepatitis C Screening recommended: no HIV Screening recommended: no  Advanced Directives: Written information was not prepared per patient's request.  Referrals & Orders No orders of the defined types were placed in this encounter.   Follow-up Plan Follow-up with Nicole Pretty, FNP as planned Consider Flu and TDAP vaccines at your next visit with your PCP   I have personally reviewed and noted the following in the patient's chart:   Medical and social history Use of alcohol, tobacco or illicit drugs  Current medications and supplements Functional ability and status Nutritional status Physical activity Advanced directives List of other physicians Hospitalizations, surgeries, and ER visits in previous 12 months Vitals Screenings to include cognitive, depression, and falls Referrals and appointments  In addition, I have reviewed and discussed with [name] certain preventive protocols, quality metrics, and best practice recommendations. A written personalized care plan for preventive services as well as general preventive health recommendations is available and can be mailed to the patient at her request.      Shahed Yeoman, Donny Pique, LPN  [id number]   I have reviewed and agree with the above AWV documentation.   [name], FNP

## 2019-04-07 ENCOUNTER — Other Ambulatory Visit: Payer: Self-pay | Admitting: Family Medicine

## 2019-04-07 DIAGNOSIS — R112 Nausea with vomiting, unspecified: Secondary | ICD-10-CM

## 2019-05-07 ENCOUNTER — Telehealth: Payer: Self-pay | Admitting: Nurse Practitioner

## 2019-05-08 NOTE — Telephone Encounter (Signed)
Pt called stating that she called yesterday requesting a Rx be sent to her pharmacy for her mouth and also wants mouth wash. Says she really needs the medicine today because she is going out of town later today. Pt says she has been having issues with receiving calls. Will contact the pharmacy to see if we sent over the Rx's.

## 2019-05-12 MED ORDER — NYSTATIN 100000 UNIT/ML MT SUSP: 5.0000 mL | Freq: Four times a day (QID) | OROMUCOSAL | 0 refills | Status: DC

## 2019-06-14 ENCOUNTER — Other Ambulatory Visit: Payer: Self-pay | Admitting: Family Medicine

## 2019-06-14 MED ORDER — FLUCONAZOLE 150 MG PO TABS: 150.0000 mg | ORAL_TABLET | Freq: Once | ORAL | 0 refills | Status: AC

## 2019-06-15 ENCOUNTER — Emergency Department (HOSPITAL_BASED_OUTPATIENT_CLINIC_OR_DEPARTMENT_OTHER)
Admission: EM | Admit: 2019-06-15 | Discharge: 2019-06-15 | Disposition: A | Discharge status: Home / Self Care | Payer: Medicare Other | Attending: Emergency Medicine | Admitting: Emergency Medicine

## 2019-06-15 ENCOUNTER — Encounter (HOSPITAL_BASED_OUTPATIENT_CLINIC_OR_DEPARTMENT_OTHER): Payer: Self-pay | Admitting: Emergency Medicine

## 2019-06-15 ENCOUNTER — Other Ambulatory Visit: Payer: Self-pay

## 2019-06-15 DIAGNOSIS — G44209 Tension-type headache, unspecified, not intractable: Secondary | ICD-10-CM

## 2019-06-15 DIAGNOSIS — F17210 Nicotine dependence, cigarettes, uncomplicated: Secondary | ICD-10-CM | POA: Diagnosis not present

## 2019-06-15 DIAGNOSIS — I10 Essential (primary) hypertension: Secondary | ICD-10-CM | POA: Insufficient documentation

## 2019-06-15 DIAGNOSIS — Z79899 Other long term (current) drug therapy: Secondary | ICD-10-CM | POA: Diagnosis not present

## 2019-06-15 DIAGNOSIS — R519 Headache, unspecified: Secondary | ICD-10-CM | POA: Diagnosis not present

## 2019-06-15 HISTORY — DX: Bipolar disorder, unspecified: F31.9

## 2019-06-15 HISTORY — DX: Essential (primary) hypertension: I10

## 2019-06-15 MED ORDER — PROCHLORPERAZINE EDISYLATE 10 MG/2ML IJ SOLN
10.0000 mg | Freq: Once | INTRAMUSCULAR | Status: AC
  Administered 2019-06-15: 13:00:00 10 mg via INTRAVENOUS
  Filled 2019-06-15: qty 2

## 2019-06-15 MED ORDER — DIAZEPAM 5 MG PO TABS
10.0000 mg | ORAL_TABLET | Freq: Once | ORAL | Status: AC
  Administered 2019-06-15: 10 mg via ORAL
  Filled 2019-06-15: qty 2

## 2019-06-15 MED ORDER — KETOROLAC TROMETHAMINE 30 MG/ML IJ SOLN
30.0000 mg | Freq: Once | INTRAMUSCULAR | Status: AC
  Administered 2019-06-15: 30 mg via INTRAVENOUS
  Filled 2019-06-15: qty 1

## 2019-06-15 MED ORDER — DIPHENHYDRAMINE HCL 50 MG/ML IJ SOLN
25.0000 mg | Freq: Once | INTRAMUSCULAR | Status: AC
  Administered 2019-06-15: 13:00:00 25 mg via INTRAVENOUS
  Filled 2019-06-15: qty 1

## 2019-06-15 NOTE — ED Triage Notes (Signed)
Headache x 2 days. Denies N/V. Endorses light sensitivity.

## 2019-06-15 NOTE — ED Provider Notes (Signed)
[facility] EMERGENCY DEPARTMENT Provider Note   CSN: [id number] Arrival date & time: 06/15/19  1210     History Chief Complaint  Patient presents with  . Headache    [name] is a 44 y.o. female.  44yo F w/ PMH including chronic neck pain on chronic opiates, HTN, IBS, bipolar d/o, anxiety who p/w headache and neck pain.  Patient reports a chronic history of neck pain for which she takes medication and follows with a clinic.  She reports that her neck pain has been worse over the past few days and 2 days ago, she had a gradual onset of headache that got severe, briefly seemed to improve, and then became severe again.  Headache wraps around the right side of her head and is associated with some light sensitivity.  She has occasionally had some trouble focusing her vision but no loss of vision.  She has chronic right hand tingling from her neck symptoms but no new extremity numbness or tingling and no extremity weakness.  She denies any associated fever, body aches, vomiting, diarrhea, URI symptoms, or recent illness.  She denies any recent trauma or fall.  She has tried her home medications including hydrocodone 10 and baclofen without relief.  She also took an extra half dose of lisinopril thinking that it may be her blood pressure, no change in her symptoms.  The history is provided by the patient.  Headache      Past Medical History:  Diagnosis Date  . Anxiety   . Back pain   . Bipolar 1 disorder ([place])   . Hypertension   . IBS (irritable bowel syndrome)     Patient Active Problem List   Diagnosis Date Noted  . Major depressive disorder, recurrent episode ([place]) 11/13/2018  . GAD (generalized anxiety disorder) 08/23/2018  . Hypotension 08/19/2018  . Anemia 08/19/2018  . Hypokalemia 08/19/2018  . Tobacco abuse 08/19/2018  . Polypharmacy 03/05/2016  . Chronic pain 03/05/2016  . Bipolar disorder ([place]) 03/05/2016    Past Surgical History:  Procedure  Laterality Date  . ABDOMINAL HYSTERECTOMY    . BLADDER SURGERY  2015  . CHOLECYSTECTOMY       OB History   No obstetric history on file.     Family History  Problem Relation Age of Onset  . Renal Disease Mother   . Heart attack Father     Social History   Tobacco Use  . Smoking status: Current Every Day Smoker    Packs/day: 1.00    Types: Cigarettes  . Smokeless tobacco: Never Used  Substance Use Topics  . Alcohol use: No  . Drug use: No    Home Medications Prior to Admission medications   Medication Sig Start Date End Date Taking? Authorizing Provider  ALPRAZolam Duanne Moron) 0.5 MG tablet Take 1 tablet (0.5 mg total) by mouth 2 (two) times daily as needed for anxiety. 02/17/19   Hassell Done Mary-Margaret, FNP  baclofen (LIORESAL) 10 MG tablet TK 1 T PO TID PRN 11/28/18   [provider]  Calcium Polycarbophil (FIBER-CAPS PO) Take 1 capsule by mouth daily.     [provider]  cetirizine (ZYRTEC) 10 MG tablet Take 1 tablet (10 mg total) by mouth daily. Patient taking differently: Take 10 mg by mouth daily as needed for allergies.  04/08/18   Baruch Gouty, FNP  HYDROcodone-acetaminophen (NORCO) 10-325 MG tablet Take 1 tablet by mouth every 6 (six) hours as needed for moderate pain (For Neck and  Back Pain).    [provider]  lisinopril (ZESTRIL) 10 MG tablet Take 1 tablet (10 mg total) by mouth daily. Patient taking differently: Take 10 mg by mouth daily as needed. For high blood pressure 10/10/18   Hassell Done, Mary-Margaret, FNP  nystatin (MYCOSTATIN) 100000 UNIT/ML suspension Take 5 mLs (500,000 Units total) by mouth 4 (four) times daily. 05/12/19   Hassell Done, Mary-Margaret, FNP  QUEtiapine (SEROQUEL) 400 MG tablet Take 2 tablets (800 mg total) by mouth at bedtime. 03/20/19   Hassell Done, Mary-Margaret, FNP  vortioxetine HBr (TRINTELLIX) 20 MG TABS tablet Take 1 tablet (20 mg total) by mouth daily. 11/16/18   Johnn Hai, MD    Allergies    Amoxicillin, Chantix  [varenicline], Codeine, Divalproex sodium, Lithium, Propoxyphene, Augmentin [amoxicillin-pot clavulanate], and Zolpidem  Review of Systems   Review of Systems  Neurological: Positive for headaches.   All other systems reviewed and are negative except that which was mentioned in HPI  Physical Exam Updated Vital Signs BP 134/80 (BP Location: Left Arm)   Pulse 80   Temp 98.9 F (37.2 C) (Oral)   Resp 20   Ht 5\' 1"  (1.549 m)   SpO2 94%   BMI 24.19 kg/m   Physical Exam Vitals and nursing note reviewed.  Constitutional:      General: She is not in acute distress.    Appearance: She is well-developed.     Comments: Awake, alert  HENT:     Head: Normocephalic and atraumatic.  Eyes:     Extraocular Movements: Extraocular movements intact.     Conjunctiva/sclera: Conjunctivae normal.     Pupils: Pupils are equal, round, and reactive to light.  Neck:     Comments: Tenderness R cervical paraspinal muscles and tenderness with increased tone of R trapezius Cardiovascular:     Rate and Rhythm: Normal rate and regular rhythm.     Heart sounds: Normal heart sounds. No murmur.  Pulmonary:     Effort: Pulmonary effort is normal. No respiratory distress.     Breath sounds: Normal breath sounds.  Abdominal:     General: Bowel sounds are normal. There is no distension.     Palpations: Abdomen is soft.     Tenderness: There is no abdominal tenderness.  Musculoskeletal:     Cervical back: Neck supple.  Skin:    General: Skin is warm and dry.  Neurological:     Mental Status: She is alert and oriented to person, place, and time.     Cranial Nerves: No cranial nerve deficit.     Motor: No abnormal muscle tone.     Deep Tendon Reflexes: Reflexes are normal and symmetric.     Comments: Fluent speech, normal finger-to-nose testing, negative pronator drift, no clonus 5/5 strength and normal sensation x all 4 extremities  Psychiatric:        Thought Content: Thought content normal.         Judgment: Judgment normal.     ED Results / Procedures / Treatments   Labs (all labs ordered are listed, but only abnormal results are displayed) Labs Reviewed - No data to display  EKG None  Radiology No results found.  Procedures Procedures (including critical care time)  Medications Ordered in ED Medications  diphenhydrAMINE (BENADRYL) injection 25 mg (25 mg Intravenous Given 06/15/19 1306)  prochlorperazine (COMPAZINE) injection 10 mg (10 mg Intravenous Given 06/15/19 1306)  diazepam (VALIUM) tablet 10 mg (10 mg Oral Given 06/15/19 1305)  ketorolac (TORADOL) 30 MG/ML injection 30  mg (30 mg Intravenous Given 06/15/19 1306)    ED Course  I have reviewed the triage vital signs and the nursing notes.     MDM Rules/Calculators/A&P                       Normal VS, normal neuro exam, no infectious sx. Sx seem to be stemming from neck pain. Doubt SAH or other life-threatening intracranial process based on hx and exam. Gave above meds including migraine cocktail and muscle relaxing medications.  On reassessment, patient states that she feels much better.  I have discussed supportive measures for her symptoms and recommended that she follow-up with her cervical spine specialist as I strongly suspect that her headache was stemming from her neck issues.  I have extensively reviewed return precautions including any new neurologic symptoms.  She voiced understanding. Final Clinical Impression(s) / ED Diagnoses Final diagnoses:  Acute non intractable tension-type headache    Rx / DC Orders ED Discharge Orders    None       Ovetta Bazzano, Wenda Overland, MD 06/15/19 1422

## 2019-06-17 ENCOUNTER — Encounter: Payer: Self-pay | Admitting: Gastroenterology

## 2019-06-17 ENCOUNTER — Ambulatory Visit (INDEPENDENT_AMBULATORY_CARE_PROVIDER_SITE_OTHER): Payer: Medicare Other | Admitting: Family Medicine

## 2019-06-17 DIAGNOSIS — G43011 Migraine without aura, intractable, with status migrainosus: Secondary | ICD-10-CM

## 2019-06-17 DIAGNOSIS — K625 Hemorrhage of anus and rectum: Secondary | ICD-10-CM

## 2019-06-17 MED ORDER — NAPROXEN SODIUM 550 MG PO TABS: 550.0000 mg | ORAL_TABLET | Freq: Two times a day (BID) | ORAL | 0 refills | Status: DC

## 2019-06-17 MED ORDER — PROMETHAZINE HCL 25 MG PO TABS: 25.0000 mg | ORAL_TABLET | Freq: Four times a day (QID) | ORAL | 0 refills | Status: DC | PRN

## 2019-06-17 NOTE — Progress Notes (Signed)
Telephone visit  Subjective: CC: migraine headache PCP: Chevis Pretty, FNP [id number] F Berezin is a 44 y.o. female calls for telephone consult today. Patient provides verbal consent for consult held via phone.  Due to COVID-19 pandemic this visit was conducted virtually. This visit type was conducted due to national recommendations for restrictions regarding the COVID-19 Pandemic (e.g. social distancing, sheltering in place) in an effort to limit this patient's exposure and mitigate transmission in our community. All issues noted in this document were discussed and addressed.  A physical exam was not performed with this format.   Location of patient: home Location of provider: Working remotely from home Others present for call: none  1. Migraine headache Patient reports that she had to go the the ED for a migraine headache 2 days ago.  She notes that the medication she was given in the hospital eased the pain but it returned once she got home and med wore off.  She reports that headache has been constant since (though somewhat less painful than onset).  Sleep helps some.  She initially had some nausea.  No vomiting.  She was having some blurred vision/ photophobia last night at work.  She has been using a muscle relaxer which eases the pain for about 1 hour then pain returns.  She is hydrating well.  She felt somewhat off balance last night but she has been fine today.  No unilateral weakness/ sensation changes.  Migraines are new onset and unrelieved by her chronic opioid  2.  Rectal bleeding Patient reports history of hemorrhoids, straining and colonic polyps.  She notes that she has had intermittent rectal bleeding for years and would like to finally see a gastroenterologist.  She would like to see with someone in [place].  ROS: Per HPI  Allergies  Allergen Reactions  . Amoxicillin Nausea And Vomiting  . Chantix [Varenicline] Other (See Comments)    Causes bad nightmares,  hallucinations  . Codeine Other (See Comments)    Shaking   . Divalproex Sodium Other (See Comments)    sedation  . Lithium Other (See Comments)    Drowsiness, tremors drowsiness  . Propoxyphene Nausea And Vomiting  . Augmentin [Amoxicillin-Pot Clavulanate] Diarrhea and Other (See Comments)  . Zolpidem Other (See Comments)    Patient states it does not agree with her body.    Past Medical History:  Diagnosis Date  . Anxiety   . Back pain   . Bipolar 1 disorder ([place])   . Hypertension   . IBS (irritable bowel syndrome)     Current Outpatient Medications:  .  ALPRAZolam (XANAX) 0.5 MG tablet, Take 1 tablet (0.5 mg total) by mouth 2 (two) times daily as needed for anxiety., Disp: 60 tablet, Rfl: 1 .  baclofen (LIORESAL) 10 MG tablet, TK 1 T PO TID PRN, Disp: , Rfl:  .  Calcium Polycarbophil (FIBER-CAPS PO), Take 1 capsule by mouth daily. , Disp: , Rfl:  .  cetirizine (ZYRTEC) 10 MG tablet, Take 1 tablet (10 mg total) by mouth daily. (Patient taking differently: Take 10 mg by mouth daily as needed for allergies. ), Disp: 30 tablet, Rfl: 11 .  HYDROcodone-acetaminophen (NORCO) 10-325 MG tablet, Take 1 tablet by mouth every 6 (six) hours as needed for moderate pain (For Neck and Back Pain)., Disp: , Rfl:  .  lisinopril (ZESTRIL) 10 MG tablet, Take 1 tablet (10 mg total) by mouth daily. (Patient taking differently: Take 10 mg by mouth daily as needed. For high blood  pressure), Disp: 90 tablet, Rfl: 1 .  nystatin (MYCOSTATIN) 100000 UNIT/ML suspension, Take 5 mLs (500,000 Units total) by mouth 4 (four) times daily., Disp: 60 mL, Rfl: 0 .  QUEtiapine (SEROQUEL) 400 MG tablet, Take 2 tablets (800 mg total) by mouth at bedtime., Disp: 180 tablet, Rfl: 1 .  vortioxetine HBr (TRINTELLIX) 20 MG TABS tablet, Take 1 tablet (20 mg total) by mouth daily., Disp: 90 tablet, Rfl: 1  Assessment/ Plan: 44 y.o. female   1. Intractable migraine without aura and with status migrainosus New onset migraine  headaches.  I reviewed the emergency department note and they thought that this was more consistent with a tension headache.  Her headache was relieved by the medications given in the emergency department.  No imaging obtained during her last ED visit but I reviewed the CT head from April 2020 which showed some degenerative changes in the C-spine but no other abnormalities within the cranium.  I am placing a referral to neurology for further evaluation given new onset migraine headaches.  It is possible that headache is a rebound headache given use of opioid medication.  I have sent in Naprosyn for her to take twice daily as needed headache as well as promethazine.  We discussed red flag signs and symptoms and reasons for reevaluation emergency department.  She voiced good understanding - Ambulatory referral to Neurology  2. Rectal bleeding We discussed the potential for increased bleeding from her hemorrhoids with the use of oral NSAID as above.  However, she cannot tolerate prednisone secondary to mood disorder.  I have given her promethazine as well to have on hand as needed nausea and hopefully this will help with the migraine headache as well.  I have placed a referral to gastroenterology for evaluation of rectal bleeding that she thinks is from hemorrhoids but cannot be sure. - Ambulatory referral to Gastroenterology  Meds ordered this encounter  Medications  . naproxen sodium (ANAPROX DS) 550 MG tablet    Sig: Take 1 tablet (550 mg total) by mouth 2 (two) times daily with a meal.    Dispense:  20 tablet    Refill:  0  . promethazine (PHENERGAN) 25 MG tablet    Sig: Take 1 tablet (25 mg total) by mouth every 6 (six) hours as needed for nausea or vomiting.    Dispense:  30 tablet    Refill:  0     Start time: 2:00pm End time: 2:15pm  Total time spent on patient care (including telephone call/ virtual visit): 27 minutes  [place], [place] [phone]

## 2019-06-18 ENCOUNTER — Telehealth: Payer: Self-pay | Admitting: Nurse Practitioner

## 2019-06-18 ENCOUNTER — Other Ambulatory Visit: Payer: Self-pay | Admitting: Family Medicine

## 2019-06-18 DIAGNOSIS — G43011 Migraine without aura, intractable, with status migrainosus: Secondary | ICD-10-CM

## 2019-06-24 ENCOUNTER — Encounter (HOSPITAL_COMMUNITY): Payer: Self-pay | Admitting: Emergency Medicine

## 2019-06-24 ENCOUNTER — Emergency Department (HOSPITAL_COMMUNITY)
Admission: EM | Admit: 2019-06-24 | Discharge: 2019-06-24 | Disposition: A | Discharge status: Home / Self Care | Payer: Medicare Other | Attending: Emergency Medicine | Admitting: Emergency Medicine

## 2019-06-24 ENCOUNTER — Encounter: Payer: Self-pay | Admitting: Family Medicine

## 2019-06-24 ENCOUNTER — Other Ambulatory Visit: Payer: Self-pay

## 2019-06-24 ENCOUNTER — Telehealth (INDEPENDENT_AMBULATORY_CARE_PROVIDER_SITE_OTHER): Payer: Medicare Other | Admitting: Family Medicine

## 2019-06-24 ENCOUNTER — Emergency Department (HOSPITAL_COMMUNITY): Payer: Medicare Other

## 2019-06-24 DIAGNOSIS — R519 Headache, unspecified: Secondary | ICD-10-CM | POA: Diagnosis not present

## 2019-06-24 DIAGNOSIS — F17210 Nicotine dependence, cigarettes, uncomplicated: Secondary | ICD-10-CM | POA: Diagnosis not present

## 2019-06-24 DIAGNOSIS — Z79899 Other long term (current) drug therapy: Secondary | ICD-10-CM | POA: Diagnosis not present

## 2019-06-24 DIAGNOSIS — F319 Bipolar disorder, unspecified: Secondary | ICD-10-CM | POA: Insufficient documentation

## 2019-06-24 DIAGNOSIS — I10 Essential (primary) hypertension: Secondary | ICD-10-CM | POA: Diagnosis not present

## 2019-06-24 LAB — I-STAT CHEM 8, ED
BUN: 12 mg/dL (ref 6–20)
Calcium, Ion: 1.2 mmol/L (ref 1.15–1.40)
Chloride: 103 mmol/L (ref 98–111)
Creatinine, Ser: 0.7 mg/dL (ref 0.44–1.00)
Glucose, Bld: 79 mg/dL (ref 70–99)
HCT: 44 % (ref 36.0–46.0)
Hemoglobin: 15 g/dL (ref 12.0–15.0)
Potassium: 4 mmol/L (ref 3.5–5.1)
Sodium: 139 mmol/L (ref 135–145)
TCO2: 29 mmol/L (ref 22–32)

## 2019-06-24 LAB — DIFFERENTIAL
Abs Immature Granulocytes: 0.04 10*3/uL (ref 0.00–0.07)
Basophils Absolute: 0 10*3/uL (ref 0.0–0.1)
Basophils Relative: 0 %
Eosinophils Absolute: 0.1 10*3/uL (ref 0.0–0.5)
Eosinophils Relative: 1 %
Immature Granulocytes: 0 %
Lymphocytes Relative: 29 %
Lymphs Abs: 3.4 10*3/uL (ref 0.7–4.0)
Monocytes Absolute: 0.8 10*3/uL (ref 0.1–1.0)
Monocytes Relative: 6 %
Neutro Abs: 7.5 10*3/uL (ref 1.7–7.7)
Neutrophils Relative %: 64 %

## 2019-06-24 LAB — APTT: aPTT: 26 seconds (ref 24–36)

## 2019-06-24 LAB — I-STAT BETA HCG BLOOD, ED (MC, WL, AP ONLY): I-stat hCG, quantitative: <5 m[IU]/mL (ref <5)

## 2019-06-24 LAB — COMPREHENSIVE METABOLIC PANEL
ALT: 12 U/L (ref 0–44)
AST: 19 U/L (ref 15–41)
Albumin: 4.3 g/dL (ref 3.5–5.0)
Alkaline Phosphatase: 99 U/L (ref 38–126)
Anion gap: 13 (ref 5–15)
BUN: 12 mg/dL (ref 6–20)
CO2: 25 mmol/L (ref 22–32)
Calcium: 9.4 mg/dL (ref 8.9–10.3)
Chloride: 104 mmol/L (ref 98–111)
Creatinine, Ser: 0.94 mg/dL (ref 0.44–1.00)
GFR calc Af Amer: >60 mL/min (ref >60)
GFR calc non Af Amer: >60 mL/min (ref >60)
Glucose, Bld: 87 mg/dL (ref 70–99)
Potassium: 3.8 mmol/L (ref 3.5–5.1)
Sodium: 142 mmol/L (ref 135–145)
Total Bilirubin: 0.7 mg/dL (ref 0.3–1.2)
Total Protein: 7.2 g/dL (ref 6.5–8.1)

## 2019-06-24 LAB — CBC
HCT: 44.1 % (ref 36.0–46.0)
Hemoglobin: 14.7 g/dL (ref 12.0–15.0)
MCH: 30.9 pg (ref 26.0–34.0)
MCHC: 33.3 g/dL (ref 30.0–36.0)
MCV: 92.8 fL (ref 80.0–100.0)
Platelets: 261 10*3/uL (ref 150–400)
RBC: 4.75 MIL/uL (ref 3.87–5.11)
RDW: 13.3 % (ref 11.5–15.5)
WBC: 11.8 10*3/uL — ABNORMAL HIGH (ref 4.0–10.5)
nRBC: 0 % (ref 0.0–0.2)

## 2019-06-24 LAB — PROTIME-INR
INR: 0.9 (ref 0.8–1.2)
Prothrombin Time: 12.4 seconds (ref 11.4–15.2)

## 2019-06-24 MED ORDER — METOCLOPRAMIDE HCL 5 MG/ML IJ SOLN
10.0000 mg | Freq: Once | INTRAMUSCULAR | Status: AC
  Administered 2019-06-24: 20:00:00 10 mg via INTRAVENOUS
  Filled 2019-06-24: qty 2

## 2019-06-24 MED ORDER — KETOROLAC TROMETHAMINE 30 MG/ML IJ SOLN
30.0000 mg | Freq: Once | INTRAMUSCULAR | Status: AC
  Administered 2019-06-24: 30 mg via INTRAVENOUS
  Filled 2019-06-24: qty 1

## 2019-06-24 MED ORDER — DEXAMETHASONE SODIUM PHOSPHATE 10 MG/ML IJ SOLN
10.0000 mg | Freq: Once | INTRAMUSCULAR | Status: AC
  Administered 2019-06-24: 20:00:00 10 mg via INTRAVENOUS
  Filled 2019-06-24: qty 1

## 2019-06-24 MED ORDER — SODIUM CHLORIDE 0.9 % IV BOLUS
1000.0000 mL | Freq: Once | INTRAVENOUS | Status: AC
  Administered 2019-06-24: 1000 mL via INTRAVENOUS

## 2019-06-24 MED ORDER — BUTALBITAL-APAP-CAFFEINE 50-325-40 MG PO TABS: 1.0000 | ORAL_TABLET | Freq: Four times a day (QID) | ORAL | 0 refills | Status: DC | PRN

## 2019-06-24 NOTE — ED Provider Notes (Signed)
[facility] Provider Note   CSN: [id number] Arrival date & time: 06/24/19  1611     History Chief Complaint  Patient presents with  . Headache    [name] is a 44 y.o. female.  HPI Patient presents to the emergency department with a headache this been ongoing over a week.  The patient was seen at the [facility] 1 week ago and treated for her headache.  The patient states she was upset that she did not get a CT scan at that time.  The patient states she was seen by her doctor [place] for naproxen and Phenergan.  The patient states that nothing seems to make the condition better or worse.  The patient states that she did not take any other medications the patient denies chest pain, shortness of breath,,blurred vision, neck pain, fever, cough, weakness, numbness, dizziness, anorexia, edema, abdominal pain, nausea, vomiting, diarrhea, rash, back pain, dysuria, hematemesis, bloody stool, near syncope, or syncope.    Past Medical History:  Diagnosis Date  . Anxiety   . Back pain   . Bipolar 1 disorder ([place])   . Hypertension   . IBS (irritable bowel syndrome)     Patient Active Problem List   Diagnosis Date Noted  . Major depressive disorder, recurrent episode ([place]) 11/13/2018  . GAD (generalized anxiety disorder) 08/23/2018  . Hypotension 08/19/2018  . Anemia 08/19/2018  . Hypokalemia 08/19/2018  . Tobacco abuse 08/19/2018  . Polypharmacy 03/05/2016  . Chronic pain 03/05/2016  . Bipolar disorder ([place]) 03/05/2016    Past Surgical History:  Procedure Laterality Date  . ABDOMINAL HYSTERECTOMY    . BLADDER SURGERY  2015  . CHOLECYSTECTOMY       OB History   No obstetric history on file.     Family History  Problem Relation Age of Onset  . Renal Disease Mother   . Heart attack Father     Social History   Tobacco Use  . Smoking status: Current Every Day Smoker    Packs/day: 1.00    Types: Cigarettes  .  Smokeless tobacco: Never Used  Substance Use Topics  . Alcohol use: No  . Drug use: No    Home Medications Prior to Admission medications   Medication Sig Start Date End Date Taking? Authorizing Provider  ALPRAZolam Duanne Moron) 0.5 MG tablet Take 1 tablet (0.5 mg total) by mouth 2 (two) times daily as needed for anxiety. 02/17/19   Hassell Done Mary-Margaret, FNP  baclofen (LIORESAL) 10 MG tablet TK 1 T PO TID PRN 11/28/18   [provider]  Calcium Polycarbophil (FIBER-CAPS PO) Take 1 capsule by mouth daily.     [provider]  cetirizine (ZYRTEC) 10 MG tablet Take 1 tablet (10 mg total) by mouth daily. Patient taking differently: Take 10 mg by mouth daily as needed for allergies.  04/08/18   Baruch Gouty, FNP  HYDROcodone-acetaminophen (NORCO) 10-325 MG tablet Take 1 tablet by mouth every 6 (six) hours as needed for moderate pain (For Neck and Back Pain).    [provider]  lisinopril (ZESTRIL) 10 MG tablet Take 1 tablet (10 mg total) by mouth daily. Patient taking differently: Take 10 mg by mouth daily as needed. For high blood pressure 10/10/18   Hassell Done, Mary-Margaret, FNP  naproxen sodium (ANAPROX DS) 550 MG tablet Take 1 tablet (550 mg total) by mouth 2 (two) times daily with a meal. 06/17/19   Janora Norlander, DO  nystatin (MYCOSTATIN) 100000 UNIT/ML suspension Take 5 mLs (500,000 Units total) by mouth 4 (four) times daily. 05/12/19   Hassell Done Mary-Margaret, FNP  promethazine (PHENERGAN) 25 MG tablet Take 1 tablet (25 mg total) by mouth every 6 (six) hours as needed for nausea or vomiting. 06/17/19   Janora Norlander, DO  QUEtiapine (SEROQUEL) 400 MG tablet Take 2 tablets (800 mg total) by mouth at bedtime. 03/20/19   Hassell Done, Mary-Margaret, FNP  vortioxetine HBr (TRINTELLIX) 20 MG TABS tablet Take 1 tablet (20 mg total) by mouth daily. 11/16/18   Johnn Hai, MD    Allergies    Amoxicillin, Chantix [varenicline], Codeine, Divalproex sodium, Lithium, Propoxyphene,  Augmentin [amoxicillin-pot clavulanate], Prednisone, and Zolpidem  Review of Systems   Review of Systems All other systems negative except as documented in the HPI. All pertinent positives and negatives as reviewed in the HPI. Physical Exam Updated Vital Signs BP 125/81 (BP Location: Right Arm)   Pulse 66   Temp 98.3 F (36.8 C) (Oral)   Resp 16   Ht 5\' 1"  (1.549 m)   Wt 63 kg   SpO2 95%   BMI 26.26 kg/m   Physical Exam Vitals and nursing note reviewed.  Constitutional:      General: She is not in acute distress.    Appearance: She is well-developed.  HENT:     Head: Normocephalic and atraumatic.  Eyes:     Pupils: Pupils are equal, round, and reactive to light.  Cardiovascular:     Rate and Rhythm: Normal rate and regular rhythm.     Heart sounds: Normal heart sounds. No murmur. No friction rub. No gallop.   Pulmonary:     Effort: Pulmonary effort is normal. No respiratory distress.     Breath sounds: Normal breath sounds. No wheezing.  Abdominal:     General: Bowel sounds are normal. There is no distension.     Palpations: Abdomen is soft.     Tenderness: There is no abdominal tenderness.  Musculoskeletal:     Cervical back: Normal range of motion and neck supple.  Skin:    General: Skin is warm and dry.     Capillary Refill: Capillary refill takes less than 2 seconds.     Findings: No erythema or rash.  Neurological:     Mental Status: She is alert and oriented to person, place, and time.     Motor: No abnormal muscle tone.     Coordination: Coordination normal.  Psychiatric:        Behavior: Behavior normal.     ED Results / Procedures / Treatments   Labs (all labs ordered are listed, but only abnormal results are displayed) Labs Reviewed  CBC - Abnormal; Notable for the following components:      Result Value   WBC 11.8 (*)    All other components within normal limits  PROTIME-INR  APTT  DIFFERENTIAL  COMPREHENSIVE METABOLIC PANEL  I-STAT CHEM 8,  ED  I-STAT BETA HCG BLOOD, ED ([facility], [facility], [facility] ONLY)  CBG MONITORING, ED    EKG EKG Interpretation  Date/Time:  Tuesday June 24 2019 17:14:38 EDT Ventricular Rate:  80 PR Interval:  146 QRS Duration: 66 QT Interval:  368 QTC Calculation: 424 R Axis:   147 Text Interpretation: Normal sinus rhythm Right axis deviation Anteroseptal infarct , age undetermined Abnormal ECG since last tracing no significant change Confirmed by Noemi Chapel [phone]) on 06/24/2019 8:55:31 PM   Radiology CT HEAD WO CONTRAST  Result Date:  06/24/2019 CLINICAL DATA:  44 year old female with headache and possible stroke. EXAM: CT HEAD WITHOUT CONTRAST TECHNIQUE: Contiguous axial images were obtained from the base of the skull through the vertex without intravenous contrast. COMPARISON:  Head CT dated 04/01/2016. FINDINGS: Brain: The ventricles and sulci appropriate size for patient's age. The gray-white matter discrimination is preserved. There is no acute intracranial hemorrhage. No mass effect or midline shift. No extra-axial fluid collection. Vascular: No hyperdense vessel or unexpected calcification. Skull: Normal. Negative for fracture or focal lesion. Sinuses/Orbits: Mild mucoperiosteal thickening of paranasal sinuses. No air-fluid level. The mastoid air cells are clear. Other: None IMPRESSION: No acute intracranial pathology. Electronically Signed   By: Anner Crete M.D.   On: 06/24/2019 18:47    Procedures Procedures (including critical care time)  Medications Ordered in ED Medications  sodium chloride 0.9 % bolus 1,000 mL (0 mLs Intravenous Stopped 06/24/19 2153)  dexamethasone (DECADRON) injection 10 mg (10 mg Intravenous Given 06/24/19 1941)  ketorolac (TORADOL) 30 MG/ML injection 30 mg (30 mg Intravenous Given 06/24/19 1941)  metoCLOPramide (REGLAN) injection 10 mg (10 mg Intravenous Given 06/24/19 1941)    ED Course  I have reviewed the triage vital signs and the nursing notes.  Pertinent labs &  imaging results that were available during my care of the patient were reviewed by me and considered in my medical decision making (see chart for details).    MDM Rules/Calculators/A&P                      Patient is given referral to neurology for further evaluation and care of her headache.  I feel that the patient does not have any significant cause for headache based on her physical exam findings along with her CT scan findings.  Her vital signs remained stable.  The patient is feeling better following medications here in the emergency department.  I did advise her to follow-up with her primary care doctor as well.  Patient agrees the plan and all questions were answered. Final Clinical Impression(s) / ED Diagnoses Final diagnoses:  None    Rx / DC Orders ED Discharge Orders    None       Rebeca Allegra 06/24/19 2334    Noemi Chapel, MD 06/25/19 [phone]

## 2019-06-24 NOTE — Progress Notes (Signed)
Virtual Visit via Video note  I connected with [name] on 06/29/19 at 2:36 PM by video and verified that I am speaking with the correct person using two identifiers. [name] is currently located at home and her daughter is currently with her during visit. The provider, Loman Brooklyn, FNP is located in their office at time of visit.  I discussed the limitations, risks, security and privacy concerns of performing an evaluation and management service by video and the availability of in person appointments. I also discussed with the patient that there may be a patient responsible charge related to this service. The patient expressed understanding and agreed to proceed.  Subjective: PCP: Chevis Pretty, FNP  Chief Complaint  Patient presents with  . Headache   Patient reports a constant headache for the past week. She has been to the ER and had telephone visit with a provider in our office. No imaging has been done at this point. A referral was placed to neurology and patient has an appointment scheduled for 07/09/2019. She was given Naprosyn and Phenergan which she has been taking regularly. She did also receive an infection in her neck this week. She feels she is forgetting what she is doing and having a hard time with her memory at this point due to the long lasting headache.    ROS: Per HPI  Current Outpatient Medications:  .  ALPRAZolam (XANAX) 0.5 MG tablet, Take 1 tablet (0.5 mg total) by mouth 2 (two) times daily as needed for anxiety., Disp: 60 tablet, Rfl: 1 .  baclofen (LIORESAL) 10 MG tablet, TK 1 T PO TID PRN, Disp: , Rfl:  .  butalbital-acetaminophen-caffeine (FIORICET) 50-325-40 MG tablet, Take 1 tablet by mouth every 6 (six) hours as needed for headache or migraine., Disp: 10 tablet, Rfl: 0 .  Calcium Polycarbophil (FIBER-CAPS PO), Take 1 capsule by mouth daily. , Disp: , Rfl:  .  cetirizine (ZYRTEC) 10 MG tablet, Take 1 tablet (10 mg total) by mouth  daily. (Patient taking differently: Take 10 mg by mouth daily as needed for allergies. ), Disp: 30 tablet, Rfl: 11 .  HYDROcodone-acetaminophen (NORCO) 10-325 MG tablet, Take 1 tablet by mouth every 6 (six) hours as needed for moderate pain (For Neck and Back Pain)., Disp: , Rfl:  .  lisinopril (ZESTRIL) 10 MG tablet, Take 1 tablet (10 mg total) by mouth daily. (Patient taking differently: Take 10 mg by mouth daily as needed. For high blood pressure), Disp: 90 tablet, Rfl: 1 .  naproxen sodium (ANAPROX DS) 550 MG tablet, Take 1 tablet (550 mg total) by mouth 2 (two) times daily with a meal., Disp: 20 tablet, Rfl: 0 .  nystatin (MYCOSTATIN) 100000 UNIT/ML suspension, Take 5 mLs (500,000 Units total) by mouth 4 (four) times daily., Disp: 60 mL, Rfl: 0 .  promethazine (PHENERGAN) 25 MG tablet, Take 1 tablet (25 mg total) by mouth every 6 (six) hours as needed for nausea or vomiting., Disp: 30 tablet, Rfl: 0 .  QUEtiapine (SEROQUEL) 400 MG tablet, Take 2 tablets (800 mg total) by mouth at bedtime., Disp: 180 tablet, Rfl: 1 .  vortioxetine HBr (TRINTELLIX) 20 MG TABS tablet, Take 1 tablet (20 mg total) by mouth daily., Disp: 90 tablet, Rfl: 1  Allergies  Allergen Reactions  . Amoxicillin Nausea And Vomiting  . Chantix [Varenicline] Other (See Comments)    Causes bad nightmares, hallucinations  . Codeine Other (See Comments)    Shaking   . Divalproex  Sodium Other (See Comments)    sedation  . Lithium Other (See Comments)    Drowsiness, tremors drowsiness  . Propoxyphene Nausea And Vomiting  . Augmentin [Amoxicillin-Pot Clavulanate] Diarrhea and Other (See Comments)  . Prednisone     Moodiness  . Zolpidem Other (See Comments)    Patient states it does not agree with her body.    Past Medical History:  Diagnosis Date  . Anxiety   . Back pain   . Bipolar 1 disorder ([place])   . Hypertension   . IBS (irritable bowel syndrome)     Observations/Objective: Physical Exam Constitutional:       General: She is not in acute distress.    Appearance: Normal appearance. She is ill-appearing. She is not toxic-appearing.     Comments: Laying on couch during visit.   Eyes:     General: No scleral icterus.       Right eye: No discharge.        Left eye: No discharge.     Conjunctiva/sclera: Conjunctivae normal.  Pulmonary:     Effort: Pulmonary effort is normal. No respiratory distress.  Neurological:     Mental Status: She is alert and oriented to person, place, and time.  Psychiatric:        Mood and Affect: Mood normal. Affect is tearful.        Behavior: Behavior normal.        Thought Content: Thought content normal.        Judgment: Judgment normal.    Assessment and Plan: 1. Bad headache - Advised patient to go to the ER for CT scan. Keep appointment with neurology next month.    Follow Up Instructions:   I discussed the assessment and treatment plan with the patient. The patient was provided an opportunity to ask questions and all were answered. The patient agreed with the plan and demonstrated an understanding of the instructions.   The patient was advised to call back or seek an in-person evaluation if the symptoms worsen or if the condition fails to improve as anticipated.  The above assessment and management plan was discussed with the patient. The patient verbalized understanding of and has agreed to the management plan. Patient is aware to call the clinic if symptoms persist or worsen. Patient is aware when to return to the clinic for a follow-up visit. Patient educated on when it is appropriate to go to the emergency department.   Time call ended: 2:51 PM  I provided 17 minutes of face-to-face time during this encounter.   Hendricks Limes, MSN, APRN, FNP-C [facility] 06/29/19

## 2019-06-24 NOTE — Discharge Instructions (Addendum)
You will need to follow-up with your primary doctor.  We will also refer you to neurology for further evaluation and care of this.  There may need to be further testing to cannot further delineate a possible cause of these headaches.  You need to increase your fluid intake as much as possible.

## 2019-06-24 NOTE — ED Notes (Signed)
Patient ambulated to the bathroom with no assistance; gait steady.

## 2019-06-24 NOTE — ED Triage Notes (Signed)
Pt states she has had bad headaches for about a week. Pt has not had any relief. Pt states she has never had migraines before. Pt states she was seen at [facility] but did not have head CT. Pt's states she have a CT scan. Denies any weakness, speech is clear. Pt states she has been vomiting due to the pain.

## 2019-06-29 ENCOUNTER — Encounter: Payer: Self-pay | Admitting: Family Medicine

## 2019-07-09 ENCOUNTER — Telehealth: Payer: Self-pay | Admitting: Nurse Practitioner

## 2019-07-09 NOTE — Telephone Encounter (Signed)
  Prescription Request  07/09/2019  What is the name of the medication or equipment? promethazine (PHENERGAN) 25 MG tablet    Have you contacted your pharmacy to request a refill? (if applicable) Yes  Which pharmacy would you like this sent to? [facility], pt has been having headaches and that causes her stomach issues   Patient notified that their request is being sent to the clinical staff for review and that they should receive a response within 2 business days.

## 2019-07-09 NOTE — Telephone Encounter (Signed)
Please advise on requested medication.

## 2019-07-10 NOTE — Telephone Encounter (Signed)
Left message to please call our office to schedule an appointment .

## 2019-07-10 NOTE — Telephone Encounter (Signed)
Medication dneied. Patient as not been seen. We also do not like to usepohrnergan anymore for m=nausea. Would rathe ruse zofran.

## 2019-07-22 ENCOUNTER — Ambulatory Visit: Payer: Medicare Other | Admitting: Gastroenterology

## 2019-07-24 ENCOUNTER — Encounter: Payer: Self-pay | Admitting: Nurse Practitioner

## 2019-07-24 ENCOUNTER — Ambulatory Visit (INDEPENDENT_AMBULATORY_CARE_PROVIDER_SITE_OTHER): Payer: Medicare Other | Admitting: Nurse Practitioner

## 2019-07-24 DIAGNOSIS — G43011 Migraine without aura, intractable, with status migrainosus: Secondary | ICD-10-CM

## 2019-07-24 NOTE — Progress Notes (Signed)
   Virtual Visit via telephone Note Due to COVID-19 pandemic this visit was conducted virtually. This visit type was conducted due to national recommendations for restrictions regarding the COVID-19 Pandemic (e.g. social distancing, sheltering in place) in an effort to limit this patient's exposure and mitigate transmission in our community. All issues noted in this document were discussed and addressed.  A physical exam was not performed with this format.  I connected with [name] on 07/24/19 at 10:30 by telephone and verified that I am speaking with the correct person using two identifiers. [name] is currently located in her car and her daughter is currently with her during visit. The provider, [name], FNP is located in their office at time of visit.  I discussed the limitations, risks, security and privacy concerns of performing an evaluation and management service by telephone and the availability of in person appointments. I also discussed with the patient that there may be a patient responsible charge related to this service. The patient expressed understanding and agreed to proceed.   History and Present Illness:   Chief Complaint: Migraine   HPI Patient calls in for telephone visit. She has history of migraines and was  Suppose to see neurologist. She thought she had appointment with neurologist yesterday , but when she got to the office, they told her sh ehad no appointment scheduled.She said she was having chronic migraines, so she was taking out of work until she saw neurology. When she was having migraines she was having trouble concentrating, Speech was affected and she felt weak.    Review of Systems  Constitutional: Negative for diaphoresis and weight loss.  Eyes: Negative for blurred vision, double vision and pain.  Respiratory: Negative for shortness of breath.   Cardiovascular: Negative for chest pain, palpitations, orthopnea and leg  swelling.  Gastrointestinal: Negative for abdominal pain.  Skin: Negative for rash.  Neurological: Negative for dizziness, sensory change, loss of consciousness, weakness and headaches.  Endo/Heme/Allergies: Negative for polydipsia. Does not bruise/bleed easily.  Psychiatric/Behavioral: Negative for memory loss. The patient does not have insomnia.   All other systems reviewed and are negative.    Observations/Objective: .Alert and oriented- answers all questions appropriately No distress    Assessment and Plan: [name] in today with chief complaint of Migraine   1. Intractable migraine without aura and with status migrainosus Note to return to work next week.    Follow Up Instructions: prn    I discussed the assessment and treatment plan with the patient. The patient was provided an opportunity to ask questions and all were answered. The patient agreed with the plan and demonstrated an understanding of the instructions.   The patient was advised to call back or seek an in-person evaluation if the symptoms worsen or if the condition fails to improve as anticipated.  The above assessment and management plan was discussed with the patient. The patient verbalized understanding of and has agreed to the management plan. Patient is aware to call the clinic if symptoms persist or worsen. Patient is aware when to return to the clinic for a follow-up visit. Patient educated on when it is appropriate to go to the emergency department.   Time call ended:  10:43  I provided 13 minutes of non-face-to-face time during this encounter.    [name], FNP

## 2019-09-22 ENCOUNTER — Ambulatory Visit (HOSPITAL_COMMUNITY)
Admission: EM | Admit: 2019-09-22 | Discharge: 2019-09-23 | Disposition: A | Discharge status: Home / Self Care | Payer: Medicare Other | Attending: Psychiatry | Admitting: Psychiatry

## 2019-09-22 ENCOUNTER — Other Ambulatory Visit: Payer: Self-pay

## 2019-09-22 ENCOUNTER — Ambulatory Visit (HOSPITAL_COMMUNITY)
Admission: RE | Admit: 2019-09-22 | Discharge: 2019-09-22 | Disposition: A | Discharge status: Home / Self Care | Payer: Medicare Other | Source: Home / Self Care | Attending: Psychiatry | Admitting: Psychiatry

## 2019-09-22 DIAGNOSIS — Z888 Allergy status to other drugs, medicaments and biological substances status: Secondary | ICD-10-CM | POA: Insufficient documentation

## 2019-09-22 DIAGNOSIS — F319 Bipolar disorder, unspecified: Secondary | ICD-10-CM | POA: Insufficient documentation

## 2019-09-22 DIAGNOSIS — I10 Essential (primary) hypertension: Secondary | ICD-10-CM | POA: Insufficient documentation

## 2019-09-22 DIAGNOSIS — Z91411 Personal history of adult psychological abuse: Secondary | ICD-10-CM | POA: Insufficient documentation

## 2019-09-22 DIAGNOSIS — Z881 Allergy status to other antibiotic agents status: Secondary | ICD-10-CM | POA: Insufficient documentation

## 2019-09-22 DIAGNOSIS — F411 Generalized anxiety disorder: Secondary | ICD-10-CM

## 2019-09-22 DIAGNOSIS — Z79891 Long term (current) use of opiate analgesic: Secondary | ICD-10-CM | POA: Insufficient documentation

## 2019-09-22 DIAGNOSIS — Z91410 Personal history of adult physical and sexual abuse: Secondary | ICD-10-CM | POA: Diagnosis not present

## 2019-09-22 DIAGNOSIS — K589 Irritable bowel syndrome without diarrhea: Secondary | ICD-10-CM | POA: Diagnosis not present

## 2019-09-22 DIAGNOSIS — Z885 Allergy status to narcotic agent status: Secondary | ICD-10-CM | POA: Insufficient documentation

## 2019-09-22 DIAGNOSIS — F419 Anxiety disorder, unspecified: Secondary | ICD-10-CM | POA: Diagnosis not present

## 2019-09-22 DIAGNOSIS — G8921 Chronic pain due to trauma: Secondary | ICD-10-CM | POA: Insufficient documentation

## 2019-09-22 DIAGNOSIS — F17210 Nicotine dependence, cigarettes, uncomplicated: Secondary | ICD-10-CM | POA: Insufficient documentation

## 2019-09-22 DIAGNOSIS — M549 Dorsalgia, unspecified: Secondary | ICD-10-CM | POA: Insufficient documentation

## 2019-09-22 DIAGNOSIS — Z79899 Other long term (current) drug therapy: Secondary | ICD-10-CM | POA: Diagnosis not present

## 2019-09-22 DIAGNOSIS — Z880 Allergy status to penicillin: Secondary | ICD-10-CM | POA: Insufficient documentation
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
## 2019-09-23 ENCOUNTER — Encounter (HOSPITAL_COMMUNITY): Payer: Self-pay | Admitting: Emergency Medicine

## 2019-09-23 ENCOUNTER — Other Ambulatory Visit: Payer: Self-pay

## 2019-09-23 DIAGNOSIS — F319 Bipolar disorder, unspecified: Secondary | ICD-10-CM | POA: Diagnosis not present

## 2019-09-23 LAB — ETHANOL: Alcohol, Ethyl (B): <10 mg/dL (ref <10)

## 2019-09-23 LAB — COMPREHENSIVE METABOLIC PANEL
ALT: 18 U/L (ref 0–44)
AST: 23 U/L (ref 15–41)
Albumin: 4.2 g/dL (ref 3.5–5.0)
Alkaline Phosphatase: 130 U/L — ABNORMAL HIGH (ref 38–126)
Anion gap: 10 (ref 5–15)
BUN: 6 mg/dL (ref 6–20)
CO2: 30 mmol/L (ref 22–32)
Calcium: 9.5 mg/dL (ref 8.9–10.3)
Chloride: 102 mmol/L (ref 98–111)
Creatinine, Ser: 0.85 mg/dL (ref 0.44–1.00)
GFR calc Af Amer: >60 mL/min (ref >60)
GFR calc non Af Amer: >60 mL/min (ref >60)
Glucose, Bld: 87 mg/dL (ref 70–99)
Potassium: 5 mmol/L (ref 3.5–5.1)
Sodium: 142 mmol/L (ref 135–145)
Total Bilirubin: 0.4 mg/dL (ref 0.3–1.2)
Total Protein: 7 g/dL (ref 6.5–8.1)

## 2019-09-23 LAB — POCT URINE DRUG SCREEN - MANUAL ENTRY (I-SCREEN)
POC Amphetamine UR: NOT DETECTED
POC Buprenorphine (BUP): NOT DETECTED
POC Cocaine UR: NOT DETECTED
POC Marijuana UR: NOT DETECTED
POC Methadone UR: NOT DETECTED
POC Methamphetamine UR: NOT DETECTED
POC Morphine: POSITIVE — AB
POC Oxazepam (BZO): POSITIVE — AB
POC Oxycodone UR: NOT DETECTED
POC Secobarbital (BAR): NOT DETECTED

## 2019-09-23 LAB — VALPROIC ACID LEVEL: Valproic Acid Lvl: <10 ug/mL — ABNORMAL LOW (ref 50.0–100.0)

## 2019-09-23 LAB — POCT PREGNANCY, URINE: Preg Test, Ur: NEGATIVE

## 2019-09-23 MED ORDER — ALUM & MAG HYDROXIDE-SIMETH 200-200-20 MG/5ML PO SUSP: 30.0000 mL | ORAL | Status: DC | PRN

## 2019-09-23 MED ORDER — TRAZODONE HCL 50 MG PO TABS: 50.0000 mg | ORAL_TABLET | Freq: Every evening | ORAL | Status: DC | PRN

## 2019-09-23 MED ORDER — HYDROCODONE-ACETAMINOPHEN 10-325 MG PO TABS
1.0000 | ORAL_TABLET | Freq: Once | ORAL | Status: AC
  Administered 2019-09-23: 1 via ORAL
  Filled 2019-09-23: qty 1

## 2019-09-23 MED ORDER — LORAZEPAM 1 MG PO TABS: 1.0000 mg | ORAL_TABLET | Freq: Once | ORAL | Status: DC | PRN

## 2019-09-23 MED ORDER — ACETAMINOPHEN 325 MG PO TABS: 650.0000 mg | ORAL_TABLET | Freq: Four times a day (QID) | ORAL | Status: DC | PRN

## 2019-09-23 MED ORDER — MAGNESIUM HYDROXIDE 400 MG/5ML PO SUSP: 30.0000 mL | Freq: Every day | ORAL | Status: DC | PRN

## 2019-09-23 MED ORDER — VORTIOXETINE HBR 20 MG PO TABS
20.0000 mg | ORAL_TABLET | Freq: Every day | ORAL | Status: DC
  Filled 2019-09-23: qty 1

## 2019-09-23 MED ORDER — HYDROXYZINE HCL 25 MG PO TABS: 25.0000 mg | ORAL_TABLET | Freq: Three times a day (TID) | ORAL | Status: DC | PRN

## 2019-09-23 MED ORDER — BACLOFEN 10 MG PO TABS: 10.0000 mg | ORAL_TABLET | Freq: Three times a day (TID) | ORAL | Status: DC | PRN

## 2019-09-23 NOTE — Discharge Instructions (Addendum)
Continue current home medications Follow up with Dr. Toy Care
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
## 2019-09-23 NOTE — ED Notes (Signed)
Covid test perform on patient is NEG

## 2019-09-23 NOTE — ED Triage Notes (Signed)
Presents with Stress & anxiety increasing in severity x 1 mos.

## 2019-09-23 NOTE — ED Notes (Addendum)
Pt A&O x 4, presents with complaint of stress and anxiety  increasing in severity x 1 mos.  Pt relates stressors as work, home and people in general.  Denies SI or HI.  Pt reports she is hearing voices.  Pt cooperative, anxious, and very irritable. Skin search completed.  Monitoring for safety.

## 2019-09-23 NOTE — ED Notes (Signed)
Discharged:  Reviewed discharge and follow up instructions with patient. Patient verbalized understanding.Patient denied HI/SI/AVH upon discharge. Patient ambulated to safe transport vehicle to be transported to [facility] to pick up her car that is parked there. Patients belongings returned upon departure, no distress noted.
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
## 2019-09-29 ENCOUNTER — Ambulatory Visit (INDEPENDENT_AMBULATORY_CARE_PROVIDER_SITE_OTHER): Payer: Medicare Other | Admitting: Licensed Clinical Social Worker

## 2019-09-29 ENCOUNTER — Other Ambulatory Visit: Payer: Self-pay

## 2019-09-29 DIAGNOSIS — F3160 Bipolar disorder, current episode mixed, unspecified: Secondary | ICD-10-CM

## 2019-09-29 DIAGNOSIS — F411 Generalized anxiety disorder: Secondary | ICD-10-CM | POA: Diagnosis not present
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
## 2019-10-01 ENCOUNTER — Telehealth: Payer: Self-pay | Admitting: Nurse Practitioner

## 2019-10-01 ENCOUNTER — Other Ambulatory Visit: Payer: Self-pay | Admitting: Nurse Practitioner

## 2019-10-01 MED ORDER — FLUCONAZOLE 150 MG PO TABS
150.0000 mg | ORAL_TABLET | ORAL | 0 refills | Status: DC | PRN
Start: 2019-10-01 — End: 2019-11-04

## 2019-10-01 NOTE — Telephone Encounter (Signed)
Patient is having cracking in the corners of her mouth and is wondering if you can send in [place] for her to [facility].  She states this has happened before and this is the medication Shelah Lewandowsky sent in for her.

## 2019-10-01 NOTE — Telephone Encounter (Signed)
Patient aware.

## 2019-10-01 NOTE — Telephone Encounter (Signed)
Diflucan Prescription sent to pharmacy, if this does not improve will need to be seen.

## 2019-10-01 NOTE — Telephone Encounter (Signed)
  Incoming Patient Call  10/01/2019  What symptoms do you have? Corners of mouth are busted and bleeding   How long have you been sick? Since yesterday  Have you been seen for this problem? no  If your provider decides to give you a prescription, which pharmacy would you like for it to be sent to? Wants diflucan sent to [facility]   Patient informed that this information will be sent to the clinical staff for review and that they should receive a follow up call.

## 2019-10-02 NOTE — Telephone Encounter (Signed)
Was ordered yesterday

## 2019-10-20 ENCOUNTER — Other Ambulatory Visit: Payer: Self-pay

## 2019-10-20 ENCOUNTER — Ambulatory Visit (HOSPITAL_COMMUNITY): Payer: Medicaid Other | Admitting: Licensed Clinical Social Worker

## 2019-10-22 ENCOUNTER — Encounter: Payer: Self-pay | Admitting: Family Medicine

## 2019-10-22 ENCOUNTER — Telehealth (INDEPENDENT_AMBULATORY_CARE_PROVIDER_SITE_OTHER): Payer: Medicare Other | Admitting: Family Medicine

## 2019-10-22 DIAGNOSIS — R300 Dysuria: Secondary | ICD-10-CM

## 2019-10-22 MED ORDER — SULFAMETHOXAZOLE-TRIMETHOPRIM 800-160 MG PO TABS
1.0000 | ORAL_TABLET | Freq: Two times a day (BID) | ORAL | 0 refills | Status: DC
Start: 2019-10-22 — End: 2019-11-28

## 2019-10-22 NOTE — Progress Notes (Addendum)
Subjective:    Patient ID: [name], female    DOB: [birth year], 44 y.o.   MRN: [id number]   HPI: [name] is a 44 y.o. female presenting for UTI sx. Had been at the beach. After swimming in the ocean legs started swelling. Can't pee as much as normal. Bladder area is sore and also around kidneys. Having cold chills.   Depression screen [facility] 2/9 04/02/2019 12/24/2018 08/23/2018 06/11/2018 04/25/2018  Decreased Interest 0 2 0 3 0  Down, Depressed, Hopeless 1 2 1 3  0  PHQ - 2 Score 1 4 1 6  0  Altered sleeping - 0 - 2 -  Tired, decreased energy - 1 - 3 -  Change in appetite - 1 - 3 -  Feeling bad or failure about yourself  - 0 - 2 -  Trouble concentrating - 1 - 3 -  Moving slowly or fidgety/restless - 1 - 2 -  Suicidal thoughts - 0 - 0 -  PHQ-9 Score - 8 - 21 -  Difficult doing work/chores - - - Very difficult -  Some encounter information is confidential and restricted. Go to Review Flowsheets activity to see all data.     Relevant past medical, surgical, family and social history reviewed and updated as indicated.  Interim medical history since our last visit reviewed. Allergies and medications reviewed and updated.  ROS:  Review of Systems  Constitutional: Negative for chills, diaphoresis and fever.  HENT: Negative for congestion.   Eyes: Negative for visual disturbance.  Respiratory: Negative for cough and shortness of breath.   Cardiovascular: Negative for chest pain and palpitations.  Gastrointestinal: Negative for constipation, diarrhea and nausea.  Genitourinary: Positive for dysuria, frequency and urgency. Negative for decreased urine volume, flank pain, hematuria, menstrual problem and pelvic pain.  Musculoskeletal: Negative for arthralgias and joint swelling.  Skin: Negative for rash.  Neurological: Negative for dizziness and numbness.     Social History   Tobacco Use  Smoking Status Current Every Day Smoker  . Packs/day: 1.00  . Types:  Cigarettes  Smokeless Tobacco Never Used       Objective:     Wt Readings from Last 3 Encounters:  06/24/19 139 lb (63 kg)  12/24/18 128 lb (58.1 kg)  10/10/18 123 lb (55.8 kg)     Exam deferred. Pt. Harboring due to COVID 19. Phone visit performed.   Assessment & Plan:   1. Dysuria     Meds ordered this encounter  Medications  . sulfamethoxazole-trimethoprim (BACTRIM DS) 800-160 MG tablet    Sig: Take 1 tablet by mouth 2 (two) times daily.    Dispense:  14 tablet    Refill:  0    Orders Placed This Encounter  Procedures  . Microscopic Examination  . Urinalysis, Complete      Diagnoses and all orders for this visit:  Dysuria -     Urinalysis, Complete  Other orders -     sulfamethoxazole-trimethoprim (BACTRIM DS) 800-160 MG tablet; Take 1 tablet by mouth 2 (two) times daily. -     Microscopic Examination    Virtual Visit via telephone Note  I discussed the limitations, risks, security and privacy concerns of performing an evaluation and management service by telephone and the availability of in person appointments. The patient was identified with two identifiers. Pt.expressed understanding and agreed to proceed. Pt. Is at home. Dr. Livia Snellen is in his office.  Follow Up Instructions:   I discussed the assessment  and treatment plan with the patient. The patient was provided an opportunity to ask questions and all were answered. The patient agreed with the plan and demonstrated an understanding of the instructions.   The patient was advised to call back or seek an in-person evaluation if the symptoms worsen or if the condition fails to improve as anticipated.   Total minutes including chart review and phone contact time: 8   Follow up plan: No follow-ups on file.  Claretta Fraise, MD [place]

## 2019-10-23 LAB — MICROSCOPIC EXAMINATION
Epithelial Cells (non renal): >10 /hpf — AB (ref 0–10)
RBC, Urine: NONE SEEN /hpf (ref 0–2)
Renal Epithel, UA: NONE SEEN /hpf

## 2019-10-23 LAB — URINALYSIS, COMPLETE
Bilirubin, UA: NEGATIVE
Glucose, UA: NEGATIVE
Ketones, UA: NEGATIVE
Leukocytes,UA: NEGATIVE
Nitrite, UA: NEGATIVE
Protein,UA: NEGATIVE
RBC, UA: NEGATIVE
Specific Gravity, UA: 1.015 (ref 1.005–1.030)
Urobilinogen, Ur: 0.2 mg/dL (ref 0.2–1.0)
pH, UA: 6 (ref 5.0–7.5)

## 2019-11-04 ENCOUNTER — Telehealth: Payer: Self-pay | Admitting: Nurse Practitioner

## 2019-11-04 MED ORDER — FLUCONAZOLE 150 MG PO TABS: ORAL_TABLET | ORAL | 0 refills | Status: DC

## 2019-11-04 MED ORDER — FLUCONAZOLE 150 MG PO TABS
ORAL_TABLET | ORAL | 0 refills | Status: DC
Start: 2019-11-04 — End: 2019-11-04

## 2019-11-04 NOTE — Telephone Encounter (Signed)
Diflucan sent to pharacy

## 2019-11-09 ENCOUNTER — Emergency Department (HOSPITAL_BASED_OUTPATIENT_CLINIC_OR_DEPARTMENT_OTHER)
Admission: EM | Admit: 2019-11-09 | Discharge: 2019-11-09 | Disposition: A | Discharge status: Home / Self Care | Payer: Medicare Other | Attending: Emergency Medicine | Admitting: Emergency Medicine

## 2019-11-09 ENCOUNTER — Other Ambulatory Visit: Payer: Self-pay

## 2019-11-09 ENCOUNTER — Emergency Department (HOSPITAL_BASED_OUTPATIENT_CLINIC_OR_DEPARTMENT_OTHER): Payer: Medicare Other

## 2019-11-09 ENCOUNTER — Encounter (HOSPITAL_BASED_OUTPATIENT_CLINIC_OR_DEPARTMENT_OTHER): Payer: Self-pay | Admitting: Emergency Medicine

## 2019-11-09 DIAGNOSIS — F17210 Nicotine dependence, cigarettes, uncomplicated: Secondary | ICD-10-CM | POA: Diagnosis not present

## 2019-11-09 DIAGNOSIS — R110 Nausea: Secondary | ICD-10-CM | POA: Insufficient documentation

## 2019-11-09 DIAGNOSIS — Z20822 Contact with and (suspected) exposure to covid-19: Secondary | ICD-10-CM | POA: Diagnosis not present

## 2019-11-09 DIAGNOSIS — Z79899 Other long term (current) drug therapy: Secondary | ICD-10-CM | POA: Diagnosis not present

## 2019-11-09 DIAGNOSIS — I10 Essential (primary) hypertension: Secondary | ICD-10-CM | POA: Diagnosis not present

## 2019-11-09 DIAGNOSIS — R519 Headache, unspecified: Secondary | ICD-10-CM

## 2019-11-09 DIAGNOSIS — J449 Chronic obstructive pulmonary disease, unspecified: Secondary | ICD-10-CM | POA: Diagnosis not present

## 2019-11-09 LAB — BASIC METABOLIC PANEL
Anion gap: 9 (ref 5–15)
BUN: 13 mg/dL (ref 6–20)
CO2: 33 mmol/L — ABNORMAL HIGH (ref 22–32)
Calcium: 9.4 mg/dL (ref 8.9–10.3)
Chloride: 94 mmol/L — ABNORMAL LOW (ref 98–111)
Creatinine, Ser: 0.79 mg/dL (ref 0.44–1.00)
GFR calc Af Amer: >60 mL/min (ref >60)
GFR calc non Af Amer: >60 mL/min (ref >60)
Glucose, Bld: 87 mg/dL (ref 70–99)
Potassium: 4.8 mmol/L (ref 3.5–5.1)
Sodium: 136 mmol/L (ref 135–145)

## 2019-11-09 LAB — CBC WITH DIFFERENTIAL/PLATELET
Abs Immature Granulocytes: 0.02 10*3/uL (ref 0.00–0.07)
Basophils Absolute: 0 10*3/uL (ref 0.0–0.1)
Basophils Relative: 1 %
Eosinophils Absolute: 0.2 10*3/uL (ref 0.0–0.5)
Eosinophils Relative: 3 %
HCT: 44.5 % (ref 36.0–46.0)
Hemoglobin: 14.3 g/dL (ref 12.0–15.0)
Immature Granulocytes: 0 %
Lymphocytes Relative: 43 %
Lymphs Abs: 3.2 10*3/uL (ref 0.7–4.0)
MCH: 30.6 pg (ref 26.0–34.0)
MCHC: 32.1 g/dL (ref 30.0–36.0)
MCV: 95.1 fL (ref 80.0–100.0)
Monocytes Absolute: 0.6 10*3/uL (ref 0.1–1.0)
Monocytes Relative: 8 %
Neutro Abs: 3.5 10*3/uL (ref 1.7–7.7)
Neutrophils Relative %: 45 %
Platelets: 212 10*3/uL (ref 150–400)
RBC: 4.68 MIL/uL (ref 3.87–5.11)
RDW: 13.7 % (ref 11.5–15.5)
WBC: 7.6 10*3/uL (ref 4.0–10.5)
nRBC: 0 % (ref 0.0–0.2)

## 2019-11-09 LAB — SARS CORONAVIRUS 2 BY RT PCR (HOSPITAL ORDER, PERFORMED IN ~~LOC~~ HOSPITAL LAB): SARS Coronavirus 2: NEGATIVE

## 2019-11-09 MED ORDER — KETOROLAC TROMETHAMINE 30 MG/ML IJ SOLN
30.0000 mg | Freq: Once | INTRAMUSCULAR | Status: AC
  Administered 2019-11-09: 30 mg via INTRAVENOUS
  Filled 2019-11-09: qty 1

## 2019-11-09 MED ORDER — METOCLOPRAMIDE HCL 5 MG/ML IJ SOLN
10.0000 mg | Freq: Once | INTRAMUSCULAR | Status: AC
  Administered 2019-11-09: 10 mg via INTRAVENOUS
  Filled 2019-11-09: qty 2

## 2019-11-09 MED ORDER — SODIUM CHLORIDE 0.9 % IV BOLUS
1000.0000 mL | Freq: Once | INTRAVENOUS | Status: AC
  Administered 2019-11-09: 1000 mL via INTRAVENOUS

## 2019-11-09 NOTE — ED Notes (Signed)
Pt ambulated to bathroom without O2. O2 sats dropped to 75%. EDP made aware. Pt placed back on O2 and O2 sats returned to 98%

## 2019-11-09 NOTE — ED Triage Notes (Signed)
Headache since yesterday

## 2019-11-09 NOTE — Discharge Instructions (Signed)
You were seen in the emergency department for headache.  You had blood work that did not show any obvious abnormalities.  Your symptoms improved with some medication.  Your oxygen level was noted to be low at times while you were here.  You will need to follow-up with your doctor regarding this.  We put in a referral to neurology regarding her headaches.  The clinic should contact you for follow-up.  Return to the emergency department if any worsening or concerning symptoms

## 2019-11-09 NOTE — ED Notes (Signed)
Pt given snack and drink 

## 2019-11-09 NOTE — ED Provider Notes (Signed)
[place] EMERGENCY DEPARTMENT Provider Note   CSN: [id number] Arrival date & time: 11/09/19  7654     History Chief Complaint  Patient presents with  . Headache    [name] is a 44 y.o. female.  She is here with a complaint of 5 out of 10 generalized headache that started yesterday.  Did not respond her typical headache medication.  She said she has been getting headaches for the last 8 months and she wants to know why she gets them.  She has not seen her doctor for them.  She says it caused her to lose weight and not eat.  Sometimes with some photosensitivity.  Denies any blurry vision double vision numbness or weakness.  No abdominal pain.  No urinary symptoms.  The history is provided by the patient.  Headache Pain location:  Generalized Quality:  Dull Radiates to:  Does not radiate Severity currently:  5/10 Severity at highest:  9/10 Onset quality:  Gradual Duration:  2 days Timing:  Constant Progression:  Unchanged Chronicity:  Recurrent Similar to prior headaches: yes   Relieved by:  Nothing Worsened by:  Nothing Ineffective treatments:  Prescription medications Associated symptoms: cough ("smoker"), nausea and photophobia   Associated symptoms: no abdominal pain, no blurred vision, no diarrhea, no fever, no focal weakness, no hearing loss, no neck stiffness, no numbness, no sore throat, no syncope, no URI and no weakness        Past Medical History:  Diagnosis Date  . Anxiety   . Back pain   . Bipolar 1 disorder ([place])   . Hypertension   . IBS (irritable bowel syndrome)     Patient Active Problem List   Diagnosis Date Noted  . Major depressive disorder, recurrent episode ([place]) 11/13/2018  . GAD (generalized anxiety disorder) 08/23/2018  . Hypotension 08/19/2018  . Anemia 08/19/2018  . Hypokalemia 08/19/2018  . Tobacco abuse 08/19/2018  . Polypharmacy 03/05/2016  . Chronic pain 03/05/2016  . Bipolar disorder ([place]) 03/05/2016     Past Surgical History:  Procedure Laterality Date  . ABDOMINAL HYSTERECTOMY    . BLADDER SURGERY  2015  . CHOLECYSTECTOMY       OB History   No obstetric history on file.     Family History  Problem Relation Age of Onset  . Renal Disease Mother   . Heart attack Father     Social History   Tobacco Use  . Smoking status: Current Every Day Smoker    Packs/day: 1.00    Types: Cigarettes  . Smokeless tobacco: Never Used  Vaping Use  . Vaping Use: Never used  Substance Use Topics  . Alcohol use: No  . Drug use: No    Home Medications Prior to Admission medications   Medication Sig Start Date End Date Taking? Authorizing Provider  ALPRAZolam Duanne Moron) 1 MG tablet Take 1 mg by mouth 4 (four) times daily as needed. 09/09/19   [provider]  baclofen (LIORESAL) 10 MG tablet TK 1 T PO TID PRN 11/28/18   [provider]  butalbital-acetaminophen-caffeine (FIORICET) 50-325-40 MG tablet Take 1 tablet by mouth every 6 (six) hours as needed for headache or migraine. 06/24/19 06/23/20  Lawyer, Harrell Gave, PA-C  Calcium Polycarbophil (FIBER-CAPS PO) Take 1 capsule by mouth daily.     [provider]  cetirizine (ZYRTEC) 10 MG tablet Take 1 tablet (10 mg total) by mouth daily. Patient taking differently: Take 10 mg by mouth daily as needed for allergies.  04/08/18   Baruch Gouty, FNP  divalproex (DEPAKOTE ER) 500 MG 24 hr tablet Take by mouth daily. Patient reports that she take 2 (500 mg) at bedtime.    [provider]  fluconazole (DIFLUCAN) 150 MG tablet 1 po q week x 4 weeks 11/04/19   Chevis Pretty, FNP  HYDROcodone-acetaminophen (NORCO) 10-325 MG tablet Take 1 tablet by mouth every 6 (six) hours as needed for moderate pain (For Neck and Back Pain).    [provider]  lisinopril (ZESTRIL) 10 MG tablet Take 1 tablet (10 mg total) by mouth daily. Patient taking differently: Take 10 mg by mouth daily as needed. For high blood pressure  10/10/18   Hassell Done, Mary-Margaret, FNP  naproxen sodium (ANAPROX DS) 550 MG tablet Take 1 tablet (550 mg total) by mouth 2 (two) times daily with a meal. 06/17/19   Gottschalk, Leatrice Jewels M, DO  nystatin (MYCOSTATIN) 100000 UNIT/ML suspension Take 5 mLs (500,000 Units total) by mouth 4 (four) times daily. 05/12/19   Hassell Done Mary-Margaret, FNP  promethazine (PHENERGAN) 25 MG tablet Take 1 tablet (25 mg total) by mouth every 6 (six) hours as needed for nausea or vomiting. 06/17/19   Janora Norlander, DO  QUEtiapine (SEROQUEL) 400 MG tablet Take 2 tablets (800 mg total) by mouth at bedtime. 03/20/19   Hassell Done Mary-Margaret, FNP  sulfamethoxazole-trimethoprim (BACTRIM DS) 800-160 MG tablet Take 1 tablet by mouth 2 (two) times daily. 10/22/19   Claretta Fraise, MD  vortioxetine HBr (TRINTELLIX) 20 MG TABS tablet Take 1 tablet (20 mg total) by mouth daily. 11/16/18   Johnn Hai, MD    Allergies    Amoxicillin, Chantix [varenicline], Codeine, Divalproex sodium, Lithium, Propoxyphene, Augmentin [amoxicillin-pot clavulanate], Prednisone, and Zolpidem  Review of Systems   Review of Systems  Constitutional: Negative for fever.  HENT: Negative for hearing loss and sore throat.   Eyes: Positive for photophobia. Negative for blurred vision.  Respiratory: Positive for cough ("smoker"). Negative for shortness of breath.   Cardiovascular: Negative for chest pain and syncope.  Gastrointestinal: Positive for nausea. Negative for abdominal pain and diarrhea.  Genitourinary: Negative for dysuria.  Musculoskeletal: Negative for neck stiffness.  Skin: Negative for rash.  Neurological: Positive for headaches. Negative for focal weakness, weakness and numbness.    Physical Exam Updated Vital Signs BP 133/79 (BP Location: Right Arm)   Pulse 87   Temp 98.3 F (36.8 C) (Oral)   Resp 16   Ht 5\' 1"  (1.549 m)   Wt 60.8 kg   SpO2 90%   BMI 25.32 kg/m   Physical Exam Vitals and nursing note reviewed.  Constitutional:       General: She is not in acute distress.    Appearance: Normal appearance. She is well-developed.  HENT:     Head: Normocephalic and atraumatic.     Right Ear: Tympanic membrane normal.     Left Ear: Tympanic membrane normal.  Eyes:     Extraocular Movements: Extraocular movements intact.     Conjunctiva/sclera: Conjunctivae normal.     Pupils: Pupils are equal, round, and reactive to light.  Cardiovascular:     Rate and Rhythm: Normal rate and regular rhythm.     Heart sounds: No murmur heard.   Pulmonary:     Effort: Pulmonary effort is normal. No respiratory distress.     Breath sounds: Normal breath sounds.  Abdominal:     Palpations: Abdomen is soft.     Tenderness: There is no abdominal tenderness.  Musculoskeletal:  General: Normal range of motion.     Cervical back: Normal range of motion and neck supple.  Skin:    General: Skin is warm and dry.  Neurological:     Mental Status: She is alert.     GCS: GCS eye subscore is 4. GCS verbal subscore is 5. GCS motor subscore is 6.     Cranial Nerves: No cranial nerve deficit.     Sensory: No sensory deficit.     Motor: No weakness.     ED Results / Procedures / Treatments   Labs (all labs ordered are listed, but only abnormal results are displayed) Labs Reviewed  BASIC METABOLIC PANEL - Abnormal; Notable for the following components:      Result Value   Chloride 94 (*)    CO2 33 (*)    All other components within normal limits  SARS CORONAVIRUS 2 BY RT PCR (HOSPITAL ORDER, [place] [facility])  CBC WITH DIFFERENTIAL/PLATELET    EKG None  Radiology DG Chest Port 1 View  Result Date: 11/09/2019 CLINICAL DATA:  Hypoxia EXAM: PORTABLE CHEST 1 VIEW COMPARISON:  08/20/2018 FINDINGS: Lungs are clear.  No pleural effusion or pneumothorax. The heart is normal in size. Visualized osseous structures are within normal limits. IMPRESSION: Normal chest radiographs. Electronically Signed   By: Julian Hy M.D.   On: 11/09/2019 10:28    Procedures Procedures (including critical care time)  Medications Ordered in ED Medications  ketorolac (TORADOL) 30 MG/ML injection 30 mg (30 mg Intravenous Given 11/09/19 0929)  metoCLOPramide (REGLAN) injection 10 mg (10 mg Intravenous Given 11/09/19 0932)  sodium chloride 0.9 % bolus 1,000 mL (0 mLs Intravenous Stopped 11/09/19 1119)    ED Course  I have reviewed the triage vital signs and the nursing notes.  Pertinent labs & imaging results that were available during my care of the patient were reviewed by me and considered in my medical decision making (see chart for details).  Clinical Course as of Nov 08 1840  Nancy Fetter Nov 09, 2019  0953 Reevaluated patient, she said her headache is much better.  She walked to the bathroom when she returned her pulse ox was in the mid 36s.  Placed on oxygen and she is coming back up.  She does not say that she is short of breath.  Getting a chest x-ray and Covid swab.   [MB]  1020 Chest x-ray interpreted by me as no acute infiltrates.   [MB]    Clinical Course User Index [MB] Hayden Rasmussen, MD   MDM Rules/Calculators/A&P                         This patient complains of generalized headache this involves an extensive number of treatment Options and is a complaint that carries with it a high risk of complications and Morbidity. The differential includes migraine, tension headache, less likely mass, bleed.  I ordered, reviewed and interpreted labs, which included CBC with normal white count normal hemoglobin, chemistries were normal other than elevated bicarb probably reflecting some of her chronic COPD, Covid testing negative I ordered medication IV fluids Reglan Toradol with improvement in her symptoms I ordered imaging studies which included chest x-ray and I independently    visualized and interpreted imaging which showed no acute infiltrates Previous records obtained and reviewed in epic, last head CT  done in March was negative  After the interventions stated above, I reevaluated the patient  and found patient symptoms to be improved.  She did have a period of hypoxia which was not explained until she mentioned that she uses oxygen at home but has not used it in a while.  Likely reflects element of her longstanding COPD and current smoking.  Recommended she contact her PCP for continuation of her oxygen.  We will also put a referral into neurology for her for her acute on chronic headaches.   Final Clinical Impression(s) / ED Diagnoses Final diagnoses:  Generalized headache  Chronic obstructive pulmonary disease, unspecified COPD type [facility])    Rx / DC Orders ED Discharge Orders    None       Hayden Rasmussen, MD 11/09/19 1844

## 2019-11-09 NOTE — ED Notes (Signed)
Pt O2 sats 86-91% on RA. Pt denies SOB. RT at bedside for assessment. EDP made aware. Pt placed on O2 2lpm via nasal cannula.

## 2019-11-13 ENCOUNTER — Encounter: Payer: Self-pay | Admitting: Neurology

## 2019-11-18 ENCOUNTER — Other Ambulatory Visit: Payer: Self-pay | Admitting: Nurse Practitioner

## 2019-11-18 NOTE — Telephone Encounter (Signed)
  Prescription Request  11/18/2019  What is the name of the medication or equipment? Oxygen machine/ pt went to hospital and they said she needs machine because her levels were low 90s  Have you contacted your pharmacy to request a refill? (if applicable) no  Which pharmacy would you like this sent to? No sure   Patient notified that their request is being sent to the clinical staff for review and that they should receive a response within 2 business days.

## 2019-11-19 NOTE — Telephone Encounter (Addendum)
LMOVM that pt will need an office visit with oxygen level test, sitting on room air,  walking, then walking with oxygen on, for [place] to be able to order oxygen

## 2019-11-28 ENCOUNTER — Other Ambulatory Visit: Payer: Self-pay

## 2019-11-28 ENCOUNTER — Other Ambulatory Visit: Payer: Self-pay | Admitting: Nurse Practitioner

## 2019-11-28 ENCOUNTER — Encounter: Payer: Self-pay | Admitting: Nurse Practitioner

## 2019-11-28 ENCOUNTER — Telehealth: Payer: Self-pay | Admitting: Nurse Practitioner

## 2019-11-28 ENCOUNTER — Ambulatory Visit (INDEPENDENT_AMBULATORY_CARE_PROVIDER_SITE_OTHER): Payer: Medicare Other | Admitting: Nurse Practitioner

## 2019-11-28 VITALS — BP 130/84 | HR 88 | Temp 97.1°F | Resp 20 | Ht 61.0 in | Wt 130.0 lb

## 2019-11-28 DIAGNOSIS — J9601 Acute respiratory failure with hypoxia: Secondary | ICD-10-CM

## 2019-11-28 DIAGNOSIS — F5101 Primary insomnia: Secondary | ICD-10-CM

## 2019-11-28 DIAGNOSIS — J449 Chronic obstructive pulmonary disease, unspecified: Secondary | ICD-10-CM | POA: Diagnosis not present

## 2019-11-28 DIAGNOSIS — J0101 Acute recurrent maxillary sinusitis: Secondary | ICD-10-CM

## 2019-11-28 HISTORY — DX: Primary insomnia: F51.01

## 2019-11-28 HISTORY — DX: Chronic obstructive pulmonary disease, unspecified: J44.9

## 2019-11-28 MED ORDER — SALINE SPRAY 0.65 % NA SOLN: 1.0000 | NASAL | 2 refills | Status: DC | PRN

## 2019-11-28 NOTE — Telephone Encounter (Signed)
Please advise 

## 2019-11-28 NOTE — Progress Notes (Signed)
Established Patient Office Visit  Subjective:  Patient ID: [name], female    DOB: 1975-05-07  Age: 44 y.o. MRN: [id number]  CC:  Chief Complaint  Patient presents with  . face to face for oxygen  . COPD    HPI [name] presents for a face-to-face encounter for oxygen clearance and DME orders.  Patient was recently diagnosed with chronic obstructive pulmonary disease.  Patient has reported increased shortness of breath, and tiredness and increased somnolence.  Patient went to the emergency department when she started experiencing an unresolving headache, weight loss and decreased appetite.     Concerning patient's insomnia, patient is reporting increased insomnia which she attributes to her shortness of breath and low oxygen saturation.  This is not new for patient but worse and not improving.    Past Medical History:  Diagnosis Date  . Anxiety   . Back pain   . Bipolar 1 disorder ([place])   . COPD (chronic obstructive pulmonary disease) ([place])   . Hypertension   . IBS (irritable bowel syndrome)     Past Surgical History:  Procedure Laterality Date  . ABDOMINAL HYSTERECTOMY    . BLADDER SURGERY  2015  . CHOLECYSTECTOMY      Family History  Problem Relation Age of Onset  . Renal Disease Mother   . Heart attack Father        Outpatient Medications Prior to Visit  Medication Sig Dispense Refill  . ALPRAZolam (XANAX) 1 MG tablet Take 1 mg by mouth 4 (four) times daily as needed.    . baclofen (LIORESAL) 10 MG tablet TK 1 T PO TID PRN    . butalbital-acetaminophen-caffeine (FIORICET) 50-325-40 MG tablet Take 1 tablet by mouth every 6 (six) hours as needed for headache or migraine. 10 tablet 0  . Calcium Polycarbophil (FIBER-CAPS PO) Take 1 capsule by mouth daily.     . cetirizine (ZYRTEC) 10 MG tablet Take 1 tablet (10 mg total) by mouth daily. (Patient taking differently: Take 10 mg by mouth daily as needed for allergies. ) 30 tablet 11  . divalproex  (DEPAKOTE ER) 500 MG 24 hr tablet Take by mouth daily. Patient reports that she take 2 (500 mg) at bedtime.    Marland Kitchen HYDROcodone-acetaminophen (NORCO) 10-325 MG tablet Take 1 tablet by mouth every 6 (six) hours as needed for moderate pain (For Neck and Back Pain).    Marland Kitchen lisinopril (ZESTRIL) 10 MG tablet Take 1 tablet (10 mg total) by mouth daily. (Patient taking differently: Take 10 mg by mouth daily as needed. For high blood pressure) 90 tablet 1  . nystatin (MYCOSTATIN) 100000 UNIT/ML suspension Take 5 mLs (500,000 Units total) by mouth 4 (four) times daily. 60 mL 0  . promethazine (PHENERGAN) 25 MG tablet Take 1 tablet (25 mg total) by mouth every 6 (six) hours as needed for nausea or vomiting. 30 tablet 0  . vortioxetine HBr (TRINTELLIX) 20 MG TABS tablet Take 1 tablet (20 mg total) by mouth daily. 90 tablet 1  . fluconazole (DIFLUCAN) 150 MG tablet 1 po q week x 4 weeks 4 tablet 0  . naproxen sodium (ANAPROX DS) 550 MG tablet Take 1 tablet (550 mg total) by mouth 2 (two) times daily with a meal. 20 tablet 0  . QUEtiapine (SEROQUEL) 400 MG tablet Take 2 tablets (800 mg total) by mouth at bedtime. 180 tablet 1  . sulfamethoxazole-trimethoprim (BACTRIM DS) 800-160 MG tablet Take 1 tablet by mouth 2 (two) times daily.  14 tablet 0   No facility-administered medications prior to visit.    Allergies  Allergen Reactions  . Amoxicillin Nausea And Vomiting  . Chantix [Varenicline] Other (See Comments)    Causes bad nightmares, hallucinations  . Codeine Other (See Comments)    Shaking   . Divalproex Sodium Other (See Comments)    sedation  . Lithium Other (See Comments)    Drowsiness, tremors drowsiness  . Propoxyphene Nausea And Vomiting  . Augmentin [Amoxicillin-Pot Clavulanate] Diarrhea and Other (See Comments)  . Prednisone     Moodiness  . Zolpidem Other (See Comments)    Patient states it does not agree with her body.     ROS Review of Systems  Constitutional: Negative.   HENT:  Negative.   Eyes: Negative.   Respiratory: Positive for shortness of breath.   Cardiovascular: Negative.   Gastrointestinal: Negative.   Endocrine: Negative.   Genitourinary: Negative.   Neurological: Negative.       Objective:    Physical Exam Vitals reviewed.  Constitutional:      Appearance: Normal appearance.  HENT:     Head: Normocephalic.  Eyes:     Conjunctiva/sclera: Conjunctivae normal.  Cardiovascular:     Rate and Rhythm: Normal rate and regular rhythm.     Pulses: Normal pulses.     Heart sounds: Normal heart sounds.  Pulmonary:     Comments: Shortness of breath oxygen saturation 90% without oxygen walking With 2 L oxygen saturation 97%. Abdominal:     General: Bowel sounds are normal.  Musculoskeletal:     Cervical back: Normal range of motion.  Skin:    General: Skin is warm.  Neurological:     Mental Status: She is alert and oriented to person, place, and time.     BP 130/84   Pulse 88   Temp (!) 97.1 F (36.2 C)   Resp 20   Ht 5\' 1"  (1.549 m)   Wt 130 lb (59 kg)   SpO2 93% Comment: room air - sitting  BMI 24.56 kg/m  Wt Readings from Last 3 Encounters:  11/28/19 130 lb (59 kg)  11/09/19 134 lb (60.8 kg)  06/24/19 139 lb (63 kg)     Health Maintenance Due  Topic Date Due  . COVID-19 Vaccine (1) Never done  . TETANUS/TDAP  Never done    There are no preventive care reminders to display for this patient.  Lab Results  Component Value Date   TSH 6.110 (H) 08/19/2018   Lab Results  Component Value Date   WBC 7.6 11/09/2019   HGB 14.3 11/09/2019   HCT 44.5 11/09/2019   MCV 95.1 11/09/2019   PLT 212 11/09/2019   Lab Results  Component Value Date   NA 136 11/09/2019   K 4.8 11/09/2019   CO2 33 (H) 11/09/2019   GLUCOSE 87 11/09/2019   BUN 13 11/09/2019   CREATININE 0.79 11/09/2019   BILITOT 0.4 09/23/2019   ALKPHOS 130 (H) 09/23/2019   AST 23 09/23/2019   ALT 18 09/23/2019   PROT 7.0 09/23/2019   ALBUMIN 4.2 09/23/2019    CALCIUM 9.4 11/09/2019   ANIONGAP 9 11/09/2019   Lab Results  Component Value Date   CHOL 222 (H) 04/25/2018   Lab Results  Component Value Date   HDL 36 (L) 04/25/2018   Lab Results  Component Value Date   LDLCALC 137 (H) 04/25/2018   Lab Results  Component Value Date   TRIG 245 (H) 04/25/2018  Lab Results  Component Value Date   CHOLHDL 6.2 (H) 04/25/2018   No results found for: HGBA1C    Assessment & Plan:  Acute respiratory failure with hypoxia [facility]) Patient is a 44 year old female who presents to clinic with acute respiratory failure with hypoxia patient was recently diagnosed with COPD.  Patient was in the ED for increased shortness of breath, tiredness and increased somnolence.  Patient was experiencing unresolved headache, weight loss and decreased appetite prior to going to the emergency room. On assessment today patient appears to be back to baseline.  Reassessed oxygen saturation while walking patient is 90%, with 2 L oxygen walking patient comes up to 97%.  Provided education to patient with printed handouts given. Refill sent to pharmacy Follow-up with unresolved or worsening symptoms. DME [oxygen] ordered for home use. Pulse oximetry overnight ordered.  Problem List Items Addressed This Visit    None    Visit Diagnoses    Chronic obstructive pulmonary disease, unspecified COPD type ([place])    -  Primary   Relevant Orders   For home use only DME Other see comment   Primary insomnia            Follow-up: Return if symptoms worsen or fail to improve.    Ivy Lynn, NP

## 2019-11-28 NOTE — Telephone Encounter (Signed)
Patient aware, declines the nasal spray sent to pharmacy.

## 2019-11-28 NOTE — Telephone Encounter (Signed)
Patient can continue with Zyrtec, to help with her allergies.  Patient already has medication on file and I can also send a refill.  This medication is generally over-the-counter

## 2019-11-28 NOTE — Telephone Encounter (Signed)
I did not see Ashonti- she was seen by J

## 2019-11-28 NOTE — Assessment & Plan Note (Signed)
Patient is a 45 year old female who presents to clinic with acute respiratory failure with hypoxia patient was recently diagnosed with COPD.  Patient was in the ED for increased shortness of breath, tiredness and increased somnolence.  Patient was experiencing unresolved headache, weight loss and decreased appetite prior to going to the emergency room. On assessment today patient appears to be back to baseline.  Reassessed oxygen saturation while walking patient is 90%, with 2 L oxygen walking patient comes up to 97%.  Provided education to patient with printed handouts given. Refill sent to pharmacy Follow-up with unresolved or worsening symptoms. DME [oxygen] ordered for home use. Pulse oximetry overnight ordered.

## 2019-12-02 ENCOUNTER — Telehealth: Payer: Self-pay | Admitting: Nurse Practitioner

## 2019-12-03 ENCOUNTER — Encounter (HOSPITAL_COMMUNITY): Payer: Self-pay | Admitting: Emergency Medicine

## 2019-12-03 ENCOUNTER — Other Ambulatory Visit: Payer: Self-pay

## 2019-12-03 ENCOUNTER — Emergency Department (HOSPITAL_COMMUNITY)
Admission: EM | Admit: 2019-12-03 | Discharge: 2019-12-03 | Disposition: A | Discharge status: Home / Self Care | Payer: Medicare Other | Attending: Emergency Medicine | Admitting: Emergency Medicine

## 2019-12-03 DIAGNOSIS — Z5321 Procedure and treatment not carried out due to patient leaving prior to being seen by health care provider: Secondary | ICD-10-CM | POA: Insufficient documentation

## 2019-12-03 DIAGNOSIS — G43909 Migraine, unspecified, not intractable, without status migrainosus: Secondary | ICD-10-CM | POA: Diagnosis present

## 2019-12-03 NOTE — ED Triage Notes (Signed)
Pt c/o migraines that have been going on  "A while". Been seen couple times at EDs for it. Reports that having them every single day. Reports comprehension been off for past 2-3 weeks. Took hydrocodone for pain at 2ish pm today.

## 2019-12-03 NOTE — Telephone Encounter (Signed)
Lmtcb.

## 2019-12-10 ENCOUNTER — Other Ambulatory Visit: Payer: Self-pay

## 2019-12-10 ENCOUNTER — Encounter: Payer: Self-pay | Admitting: Nurse Practitioner

## 2019-12-10 ENCOUNTER — Ambulatory Visit (INDEPENDENT_AMBULATORY_CARE_PROVIDER_SITE_OTHER): Payer: Medicare Other | Admitting: Nurse Practitioner

## 2019-12-10 VITALS — BP 146/94 | HR 106 | Temp 97.7°F | Resp 20 | Ht 61.0 in | Wt 127.0 lb

## 2019-12-10 DIAGNOSIS — R300 Dysuria: Secondary | ICD-10-CM

## 2019-12-10 DIAGNOSIS — G4452 New daily persistent headache (NDPH): Secondary | ICD-10-CM

## 2019-12-10 LAB — MICROSCOPIC EXAMINATION
Bacteria, UA: NONE SEEN
RBC, Urine: NONE SEEN /hpf (ref 0–2)
WBC, UA: NONE SEEN /hpf (ref 0–5)

## 2019-12-10 LAB — URINALYSIS, COMPLETE
Bilirubin, UA: NEGATIVE
Glucose, UA: NEGATIVE
Ketones, UA: NEGATIVE
Leukocytes,UA: NEGATIVE
Nitrite, UA: NEGATIVE
Protein,UA: NEGATIVE
RBC, UA: NEGATIVE
Specific Gravity, UA: 1.02 (ref 1.005–1.030)
Urobilinogen, Ur: 0.2 mg/dL (ref 0.2–1.0)
pH, UA: 7 (ref 5.0–7.5)

## 2019-12-10 NOTE — Patient Instructions (Signed)
Visual Disturbances  A visual disturbance is any problem that interferes with your normal vision. This can affect one eye or both eyes. Some types of visual disturbances come and go without treatment and do not cause a permanent problem. Other visual disturbances may be a sign of a medical emergency. Visual disturbances include:  Blurred vision.  Being unable to see certain colors.  Being sensitive to light.  Double vision.  Partial vision loss (visual field deficit).  Being unaware of objects on one side of the body (visual spatial inattention).  Rhythmic eye movements that you cannot control (nystagmus).  Short-term or long-term blindness.  Seeing: ? Floating spots or lines (floaters). ? Flashing or shimmering lights. ? Zigzagging lines or stars. ? The floor as tilted (visual midline shift). ? Things that are not really there (hallucinations). Causes of visual disturbances include:  Eye infection.  The thin membrane at the back of the eye separating from the eyeball (retinal detachment).  High blood pressure.  Migraine.  Glaucoma.  Ischemic stroke.  Cerebral aneurysm. It is important to get your eyes checked by a health care provider or eye specialist (ophthalmologist or optometrist) as soon as possible to determine the cause of your visual disturbance. Follow these instructions at home:  Take over-the-counter and prescription medicines only as told by your health care provider.  Do not use any products that contain nicotine or tobacco, such as cigarettes and e-cigarettes. If you need help quitting, ask your health care provider.  To lower your risk of the problems that can lead to visual disturbances: ? Eat a balanced diet that includes fruits and vegetables, whole grains, lean meat, and low-fat dairy. ? Maintain a healthy weight. Work with your health care provider to lose weight if you need to. ? Exercise regularly. Ask your health care provider what  activities are safe for you.  Do not drive if you have trouble seeing. Ask your health care provider for guidance about when it is and is not safe for you to drive.  Keep all follow-up visits as told by your health care provider. This is important. Contact a health care provider if:  Your visual disturbance changes or becomes worse. Get help right away if you:   Have new visual disturbances.  Suddenly see flashing lights or floaters.  Suddenly have a dark area in your field of vision, especially in the lower part. This can lead to a loss of central vision.  Lose vision in one or both eyes.  Have any symptoms of a stroke. "BE FAST" is an easy way to remember the main warning signs of a stroke: ? B - Balance. Signs are dizziness, sudden trouble walking, or loss of balance. ? E - Eyes. Signs are trouble seeing or a sudden change in vision. ? F - Face. Signs are sudden weakness or numbness of the face, or the face or eyelid drooping on one side. ? A - Arms. Signs are weakness or numbness in an arm. This happens suddenly and usually on one side of the body. ? S - Speech. Signs are sudden trouble speaking, slurred speech, or trouble understanding what people say. ? T - Time. Time to call emergency services. Write down what time symptoms started.  Have other signs of a stroke, such as: ? A sudden, severe headache with no known cause. ? Nausea or vomiting. ? Seizure. These symptoms may represent a serious problem that is an emergency. Do not wait to see if the symptoms will go  away. Get medical help right away. Call your local emergency services (911 in the [place] Do not drive yourself to the hospital. Summary  A visual disturbance is any problem that interferes with your normal vision.  Some visual disturbances may be a sign of a medical emergency.  It is important to get your eyes checked by a health care provider or eye specialist to determine what kind of visual disturbance you  have. This information is not intended to replace advice given to you by your health care provider. Make sure you discuss any questions you have with your health care provider. Document Revised: 07/09/2018 Document Reviewed: 04/10/2017 Elsevier Patient Education  2020 [employer].

## 2019-12-10 NOTE — Progress Notes (Signed)
   Subjective:    Patient ID: [name], female    DOB: 09-Nov-1975, 44 y.o.   MRN: [id number]   Chief Complaint: No chief complaint on file.   HPI Pt presents with c/o pain in lower abdomen and flank area that has been present for 3 or 4 days. States pain is intermittent and throbbing that she rates at a 4-5/10. Has experienced chills but no fever. States she is having difficulty with starting to urinate, dysuria, burning, frequency, urgency at times without incontinence. Has not noticed hematuria. Denies vaginal discharge, itching. Does state strong odor of urine. Does believe she has been constipated lately. Says she had a bowel movement yesterday and usually has one every couple of days. Has been taking AZOs which has helped her symptoms. Also states she has been having trouble seeing lately. States she last saw her optometrist a little over a year ago.  She says when this occurs she is having trouble seeing. Last for about a minute and then vision comes back. She will put her glasses on and vision gets better. She has had new glasses for over a year, but she very seldom wears them. She does have neurology appointment for headaches, in October..    Review of Systems  Constitutional: Positive for chills.  HENT: Negative.   Eyes: Positive for visual disturbance.  Respiratory: Negative.   Cardiovascular: Negative.   Gastrointestinal: Positive for abdominal pain and constipation.  Endocrine: Negative.   Genitourinary: Positive for difficulty urinating, dysuria, flank pain, frequency and urgency.  Skin: Negative.   Neurological: Negative.   Psychiatric/Behavioral: Negative.        Objective:   Physical Exam Vitals and nursing note reviewed.  Constitutional:      Appearance: Normal appearance.  HENT:     Head: Normocephalic.  Cardiovascular:     Rate and Rhythm: Normal rate and regular rhythm.     Heart sounds: Normal heart sounds.  Pulmonary:     Breath sounds: Normal  breath sounds.  Abdominal:     Palpations: Abdomen is soft.  Musculoskeletal:        General: Normal range of motion.     Cervical back: Normal range of motion and neck supple.  Skin:    General: Skin is warm and dry.  Neurological:     Mental Status: She is alert and oriented to person, place, and time.     BP (!) 146/94   Pulse (!) 106   Temp 97.7 F (36.5 C) (Temporal)   Resp 20   Ht 5\' 1"  (1.549 m)   Wt 127 lb (57.6 kg)   SpO2 97%   BMI 24.00 kg/m       Assessment & Plan:  [name] in today with chief complaint of Dysuria   1. Dysuria Force fluids Will due urine culture just in case - Urinalysis, Complete  2. New daily persistent headache Keep diary of headaches and blurred vision Wear glasses daily all day. - Ambulatory referral to Neurology    The above assessment and management plan was discussed with the patient. The patient verbalized understanding of and has agreed to the management plan. Patient is aware to call the clinic if symptoms persist or worsen. Patient is aware when to return to the clinic for a follow-up visit. Patient educated on when it is appropriate to go to the emergency department.   [name], FNP

## 2019-12-11 LAB — URINE CULTURE

## 2019-12-14 DIAGNOSIS — R440 Auditory hallucinations: Secondary | ICD-10-CM

## 2019-12-14 HISTORY — DX: Auditory hallucinations: R44.0

## 2019-12-15 ENCOUNTER — Other Ambulatory Visit: Payer: Self-pay | Admitting: Nurse Practitioner

## 2019-12-15 DIAGNOSIS — J449 Chronic obstructive pulmonary disease, unspecified: Secondary | ICD-10-CM

## 2019-12-16 ENCOUNTER — Other Ambulatory Visit: Payer: Self-pay | Admitting: Nurse Practitioner

## 2019-12-16 ENCOUNTER — Other Ambulatory Visit: Payer: Self-pay

## 2019-12-16 ENCOUNTER — Encounter (HOSPITAL_COMMUNITY): Payer: Self-pay | Admitting: Emergency Medicine

## 2019-12-16 ENCOUNTER — Emergency Department (HOSPITAL_COMMUNITY)
Admission: EM | Admit: 2019-12-16 | Discharge: 2019-12-17 | Disposition: A | Discharge status: Home / Self Care | Payer: Medicare Other | Attending: Emergency Medicine | Admitting: Emergency Medicine

## 2019-12-16 DIAGNOSIS — F17210 Nicotine dependence, cigarettes, uncomplicated: Secondary | ICD-10-CM | POA: Diagnosis not present

## 2019-12-16 DIAGNOSIS — R519 Headache, unspecified: Secondary | ICD-10-CM

## 2019-12-16 DIAGNOSIS — I10 Essential (primary) hypertension: Secondary | ICD-10-CM | POA: Insufficient documentation

## 2019-12-16 DIAGNOSIS — Z79899 Other long term (current) drug therapy: Secondary | ICD-10-CM | POA: Insufficient documentation

## 2019-12-16 DIAGNOSIS — J449 Chronic obstructive pulmonary disease, unspecified: Secondary | ICD-10-CM | POA: Diagnosis not present

## 2019-12-16 MED ORDER — BUTALBITAL-APAP-CAFFEINE 50-325-40 MG PO TABS: 1.0000 | ORAL_TABLET | Freq: Four times a day (QID) | ORAL | 0 refills | Status: AC | PRN

## 2019-12-16 NOTE — ED Triage Notes (Signed)
Patient reports persistent migraine headaches this week unrelieved by prescription medications , occasional emesis and photophobia .

## 2019-12-17 ENCOUNTER — Emergency Department (HOSPITAL_COMMUNITY): Payer: Medicare Other

## 2019-12-17 DIAGNOSIS — R519 Headache, unspecified: Secondary | ICD-10-CM | POA: Diagnosis not present

## 2019-12-17 LAB — CBC WITH DIFFERENTIAL/PLATELET
Abs Immature Granulocytes: 0.03 10*3/uL (ref 0.00–0.07)
Basophils Absolute: 0 10*3/uL (ref 0.0–0.1)
Basophils Relative: 0 %
Eosinophils Absolute: 0.2 10*3/uL (ref 0.0–0.5)
Eosinophils Relative: 2 %
HCT: 45.5 % (ref 36.0–46.0)
Hemoglobin: 14.6 g/dL (ref 12.0–15.0)
Immature Granulocytes: 0 %
Lymphocytes Relative: 50 %
Lymphs Abs: 5.2 10*3/uL — ABNORMAL HIGH (ref 0.7–4.0)
MCH: 29.6 pg (ref 26.0–34.0)
MCHC: 32.1 g/dL (ref 30.0–36.0)
MCV: 92.1 fL (ref 80.0–100.0)
Monocytes Absolute: 0.7 10*3/uL (ref 0.1–1.0)
Monocytes Relative: 7 %
Neutro Abs: 4.3 10*3/uL (ref 1.7–7.7)
Neutrophils Relative %: 41 %
Platelets: 213 10*3/uL (ref 150–400)
RBC: 4.94 MIL/uL (ref 3.87–5.11)
RDW: 13.4 % (ref 11.5–15.5)
WBC: 10.4 10*3/uL (ref 4.0–10.5)
nRBC: 0 % (ref 0.0–0.2)

## 2019-12-17 LAB — COMPREHENSIVE METABOLIC PANEL
ALT: 11 U/L (ref 0–44)
AST: 17 U/L (ref 15–41)
Albumin: 4.1 g/dL (ref 3.5–5.0)
Alkaline Phosphatase: 91 U/L (ref 38–126)
Anion gap: 14 (ref 5–15)
BUN: 8 mg/dL (ref 6–20)
CO2: 22 mmol/L (ref 22–32)
Calcium: 9.4 mg/dL (ref 8.9–10.3)
Chloride: 101 mmol/L (ref 98–111)
Creatinine, Ser: 0.83 mg/dL (ref 0.44–1.00)
GFR calc Af Amer: >60 mL/min (ref >60)
GFR calc non Af Amer: >60 mL/min (ref >60)
Glucose, Bld: 126 mg/dL — ABNORMAL HIGH (ref 70–99)
Potassium: 3.3 mmol/L — ABNORMAL LOW (ref 3.5–5.1)
Sodium: 137 mmol/L (ref 135–145)
Total Bilirubin: 0.2 mg/dL — ABNORMAL LOW (ref 0.3–1.2)
Total Protein: 7 g/dL (ref 6.5–8.1)

## 2019-12-17 MED ORDER — POTASSIUM CHLORIDE CRYS ER 20 MEQ PO TBCR
40.0000 meq | EXTENDED_RELEASE_TABLET | Freq: Once | ORAL | Status: AC
  Administered 2019-12-17: 40 meq via ORAL
  Filled 2019-12-17: qty 2

## 2019-12-17 MED ORDER — SODIUM CHLORIDE 0.9 % IV BOLUS
1000.0000 mL | Freq: Once | INTRAVENOUS | Status: AC
  Administered 2019-12-17: 1000 mL via INTRAVENOUS

## 2019-12-17 MED ORDER — DIPHENHYDRAMINE HCL 50 MG/ML IJ SOLN
25.0000 mg | Freq: Once | INTRAMUSCULAR | Status: AC
  Administered 2019-12-17: 25 mg via INTRAVENOUS
  Filled 2019-12-17: qty 1

## 2019-12-17 MED ORDER — PROCHLORPERAZINE EDISYLATE 10 MG/2ML IJ SOLN
10.0000 mg | Freq: Once | INTRAMUSCULAR | Status: AC
  Administered 2019-12-17: 10 mg via INTRAVENOUS
  Filled 2019-12-17: qty 2

## 2019-12-17 NOTE — ED Provider Notes (Signed)
[facility] EMERGENCY DEPARTMENT Provider Note   CSN: [id number] Arrival date & time: 12/16/19  2310     History Chief Complaint  Patient presents with  . Migraine    [name] is a 44 y.o. female.  HPI Patient is a 44 year old female with a medical history as noted below.  Patient states that about 3 days ago she began experiencing a waxing and waning headache.  Patient states her pain is worst along the crown of her head as well as the bitemporal regions.  Notes mild photophobia.  Also reports an episode of vomiting yesterday but no current nausea or vomiting.  No hematemesis.  States that her symptoms are consistent with prior migraines.  Patient has taken butalbital/acetaminophen/caffeine, without significant relief.  Patient has a new patient appointment with [facility] on 11/2 for her migraines. Denies chest pain, shortness of breath, fevers, chills, syncope.  Patient was evaluated 4 days ago at [facility] for auditory hallucinations.  She was monitored overnight and ultimately discharged in good condition. Her UDS was positive for amphetamines, benzodiazepines, opiates.  Patient states that she is planning on following up with her psychiatrist but due to experiencing a waxing and waning migraine headache has not been able to do so as of yet.  Per records, it appears that she followed up with her PCP yesterday who refilled her butalbital/acetaminophen/caffeine. Pt states she is still sometimes experiencing auditory hallucinations noting that "they'll tell me I need to take a shower" or "I need to clean" but denies any SI/HI. Pt denies wanting to speak to a counselor at this time. She is most concerned about her headache and states that she will f/u with her psychiatrist outpatient.      Past Medical History:  Diagnosis Date  . Anxiety   . Back pain   . Bipolar 1 disorder ([place])   . COPD (chronic obstructive pulmonary disease) ([place])   .  Hypertension   . IBS (irritable bowel syndrome)     Patient Active Problem List   Diagnosis Date Noted  . Chronic obstructive pulmonary disease ([place]) 11/28/2019  . Primary insomnia 11/28/2019  . Acute respiratory failure with hypoxia ([place]) 11/28/2019  . Major depressive disorder, recurrent episode ([place]) 11/13/2018  . GAD (generalized anxiety disorder) 08/23/2018  . Hypotension 08/19/2018  . Anemia 08/19/2018  . Hypokalemia 08/19/2018  . Tobacco abuse 08/19/2018  . Polypharmacy 03/05/2016  . Chronic pain 03/05/2016  . Bipolar disorder ([place]) 03/05/2016    Past Surgical History:  Procedure Laterality Date  . ABDOMINAL HYSTERECTOMY    . BLADDER SURGERY  2015  . CHOLECYSTECTOMY       OB History   No obstetric history on file.     Family History  Problem Relation Age of Onset  . Renal Disease Mother   . Heart attack Father     Social History   Tobacco Use  . Smoking status: Current Every Day Smoker    Packs/day: 1.00    Types: Cigarettes  . Smokeless tobacco: Never Used  Vaping Use  . Vaping Use: Never used  Substance Use Topics  . Alcohol use: No  . Drug use: No    Home Medications Prior to Admission medications   Medication Sig Start Date End Date Taking? Authorizing Provider  ALPRAZolam Duanne Moron) 1 MG tablet Take 1 mg by mouth 4 (four) times daily as needed. 09/09/19   [provider]  baclofen (LIORESAL) 10 MG tablet TK 1 T PO TID PRN  11/28/18   [provider]  butalbital-acetaminophen-caffeine (FIORICET) 50-325-40 MG tablet Take 1 tablet by mouth every 6 (six) hours as needed for headache or migraine. 12/16/19 12/15/20  Hassell Done, Mary-Margaret, FNP  Calcium Polycarbophil (FIBER-CAPS PO) Take 1 capsule by mouth daily.     [provider]  divalproex (DEPAKOTE ER) 500 MG 24 hr tablet Take by mouth daily. Patient reports that she take 2 (500 mg) at bedtime.    [provider]  HYDROcodone-acetaminophen (NORCO) 10-325 MG tablet Take 1  tablet by mouth every 6 (six) hours as needed for moderate pain (For Neck and Back Pain).    [provider]  lisinopril (ZESTRIL) 10 MG tablet Take 1 tablet (10 mg total) by mouth daily. Patient taking differently: Take 10 mg by mouth daily as needed. For high blood pressure 10/10/18   Hassell Done, Mary-Margaret, FNP  nystatin (MYCOSTATIN) 100000 UNIT/ML suspension Take 5 mLs (500,000 Units total) by mouth 4 (four) times daily. Patient not taking: Reported on 12/10/2019 05/12/19   Chevis Pretty, FNP  promethazine (PHENERGAN) 25 MG tablet Take 1 tablet (25 mg total) by mouth every 6 (six) hours as needed for nausea or vomiting. Patient not taking: Reported on 12/10/2019 06/17/19   Janora Norlander, DO  sodium chloride (OCEAN) 0.65 % SOLN nasal spray Place 1 spray into both nostrils as needed for congestion. 11/28/19   Ivy Lynn, NP  vortioxetine HBr (TRINTELLIX) 20 MG TABS tablet Take 1 tablet (20 mg total) by mouth daily. 11/16/18   Johnn Hai, MD    Allergies    Amoxicillin, Chantix [varenicline], Codeine, Divalproex sodium, Lithium, Propoxyphene, Augmentin [amoxicillin-pot clavulanate], Prednisone, and Zolpidem  Review of Systems   Review of Systems  All other systems reviewed and are negative. Ten systems reviewed and are negative for acute change, except as noted in the HPI.    Physical Exam Updated Vital Signs BP 128/89   Pulse 80   Temp (!) 97.5 F (36.4 C) (Oral)   Resp 16   Ht 5\' 2"  (1.575 m)   Wt 60 kg   SpO2 100%   BMI 24.19 kg/m   Physical Exam Vitals and nursing note reviewed.  Constitutional:      General: She is not in acute distress.    Appearance: Normal appearance. She is normal weight. She is not ill-appearing, toxic-appearing or diaphoretic.  HENT:     Head: Normocephalic and atraumatic.     Right Ear: External ear normal.     Left Ear: External ear normal.     Nose: Nose normal.     Mouth/Throat:     Mouth: Mucous membranes are moist.      Pharynx: Oropharynx is clear. No oropharyngeal exudate or posterior oropharyngeal erythema.  Eyes:     General: No scleral icterus.       Right eye: No discharge.        Left eye: No discharge.     Extraocular Movements: Extraocular movements intact.     Conjunctiva/sclera: Conjunctivae normal.     Pupils: Pupils are equal, round, and reactive to light.  Cardiovascular:     Rate and Rhythm: Normal rate and regular rhythm.     Pulses: Normal pulses.     Heart sounds: Normal heart sounds. No murmur heard.  No friction rub. No gallop.   Pulmonary:     Effort: Pulmonary effort is normal. No respiratory distress.     Breath sounds: Normal breath sounds. No stridor. No wheezing, rhonchi or rales.  Abdominal:     General: Abdomen is flat.     Tenderness: There is no abdominal tenderness.  Musculoskeletal:        General: Normal range of motion.     Cervical back: Normal range of motion and neck supple. No tenderness.  Skin:    General: Skin is warm and dry.  Neurological:     General: No focal deficit present.     Mental Status: She is alert and oriented to person, place, and time.     Comments: Patient is oriented to person, place, and time. Patient phonates in clear, complete, and coherent sentences. Negative arm drift. Finger to nose intact bilaterally with no visible signs of dysmetria. Strength is 5/5 in all four extremities. Distal sensation intact in all four extremities.  Psychiatric:        Mood and Affect: Mood normal.        Behavior: Behavior normal.    ED Results / Procedures / Treatments   Labs (all labs ordered are listed, but only abnormal results are displayed) Labs Reviewed  CBC WITH DIFFERENTIAL/PLATELET - Abnormal; Notable for the following components:      Result Value   Lymphs Abs 5.2 (*)    All other components within normal limits  COMPREHENSIVE METABOLIC PANEL - Abnormal; Notable for the following components:   Potassium 3.3 (*)    Glucose, Bld 126 (*)     Total Bilirubin 0.2 (*)    All other components within normal limits   EKG None  Radiology CT Head Wo Contrast  Result Date: 12/17/2019 CLINICAL DATA:  Left-sided headache EXAM: CT HEAD WITHOUT CONTRAST TECHNIQUE: Contiguous axial images were obtained from the base of the skull through the vertex without intravenous contrast. COMPARISON:  June 24, 2019 FINDINGS: Brain: Ventricles and sulci are normal in size and configuration. There is an apparent arachnoid cyst at the level of the lateral right cerebellum peripherally measuring 6 x 6 mm. No other evident mass. No hemorrhage, extra-axial fluid collection, or midline shift. The brain parenchyma appears unremarkable. No acute infarct. Vascular: No hyperdense vessel.  No evident vascular calcification. Skull: Bony calvarium appears intact. Sinuses/Orbits: Visualized paranasal sinuses are clear. Orbits appear symmetric bilaterally. Other: Mastoid air cells are clear. 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 No other evident mass. Brain parenchyma appears unremarkable without evidence suggesting acute infarct. No hemorrhage. Study otherwise unremarkable. Electronically Signed   By: Lowella Grip III M.D.   On: 12/17/2019 14:23    Procedures Procedures (including critical care time)  Medications Ordered in ED Medications  prochlorperazine (COMPAZINE) injection 10 mg (10 mg Intravenous Given 12/17/19 1436)  diphenhydrAMINE (BENADRYL) injection 25 mg (25 mg Intravenous Given 12/17/19 1434)  sodium chloride 0.9 % bolus 1,000 mL (1,000 mLs Intravenous New Bag/Given 12/17/19 1437)  potassium chloride SA (KLOR-CON) CR tablet 40 mEq (40 mEq Oral Given 12/17/19 1433)   ED Course  I have reviewed the triage vital signs and the nursing notes.  Pertinent labs & imaging results that were available during my care of the patient were reviewed by me and considered in my medical decision making (see chart  for details).  Clinical Course as of Dec 16 1512  Wed Dec 17, 2019  1337 Repleted with Klor-Con.  Potassium(!): 3.3 [LJ]  1337 Patient reports having history of migraines.  States her current headache is worse than normal and lasting longer than normal.  Will obtain a CT scan of the head.  We will provide a migraine cocktail.  Will reassess.   [LJ]    Clinical Course User Index [LJ] Moody Bruins   MDM Rules/Calculators/A&P                          Patient is a 44 year old female who presents to the emergency department today due to what appears to be a migraine headache.  Patient notes a history of migraines and states her current headache is similar to prior headaches but somewhat worse than normal.  Due to this I obtained a CT scan of the head.  Radiology did note a subcentimeter cyst.  Compared this to prior CT in March of this year, and it appears stable.  Basic labs today are reassuring.  Potassium slightly low decreased at 3.3, this was repleted with Klor-Con.  Otherwise labs are reassuring.  Patient was evaluated at [facility] 4 days ago for auditory hallucinations.  She was observed overnight and ultimately discharged in good condition.  Patient states that she has not followed up with her psychiatrist but states that she has been experiencing her migraine for the last 3 days and because of this has just not felt like it.  States that she is planning on following up with them.  She does note a continuation of her auditory hallucinations which appear to be somewhat improved.  She denies any SI/HI.  I reassessed patient on multiple occasions and she denies this.  Patient states that she does not want to discuss with a counselor at this time since she was just worked up for this.  States that she would prefer to follow-up outpatient.  Her neurological exam was benign.  She is A&O x4.  No gross deficits.  Patient was given a migraine cocktail including Compazine, Benadryl, IV  fluids.  Patient states her headache has completely subsided.  She is lying comfortably in bed.  She still has about 500 cc of IV fluids left.  Once again reiterated that patient needs to follow-up with her psychiatrist.  She states she will do so tomorrow.  I once again offered her the option to speak to a counselor today but she declined.  Her questions were answered and she was amicable at the time of discharge.  Her vital signs are stable.   An After Visit Summary was printed and given to the patient.  Patient discharged to home/self care.  Condition at discharge: Stable  Note: Portions of this report may have been transcribed using voice recognition software. Every effort was made to ensure accuracy; however, inadvertent computerized transcription errors may be present.    Final Clinical Impression(s) / ED Diagnoses Final diagnoses:  Bad headache    Rx / DC Orders ED Discharge Orders    None       Rayna Sexton, PA-C 12/17/19 [place], MD 12/18/19 1004

## 2019-12-17 NOTE — Discharge Instructions (Addendum)
Please return to the ER if you develop any new or worsening symptoms.  It was a pleasure to meet you.

## 2019-12-18 ENCOUNTER — Telehealth: Payer: Self-pay | Admitting: Nurse Practitioner

## 2019-12-18 NOTE — Telephone Encounter (Signed)
Contacted patient about making a hospital follow up and before I could make the appt she wanted [place] to reach out and see if you would just change her migraine medicine and then she would follow up?

## 2019-12-18 NOTE — Telephone Encounter (Signed)
Lmtcb.

## 2019-12-18 NOTE — Telephone Encounter (Signed)
No needs to be seen

## 2019-12-22 NOTE — Progress Notes (Deleted)
NEUROLOGY CONSULTATION NOTE  JATASIA [name] MRN: [id number] DOB: 1975-05-20  Referring provider: Aletta Edouard, MD (ED referral) Primary care provider: [name], FNP  Reason for consult:  headaches  HISTORY OF PRESENT ILLNESS: [name] is a 44 year old ***-handed female with Bipolar 1 disorder, COPD, HTN and IBS who presents for headaches.  History supplemented by hospital and PCP notes.  She has longstanding history of intractable migraines.  ***    More recently, she was seen in the ED at [facility] on 11/09/2019 for headache, where she was treated with a migraine cocktail.  She was seen in the ED at [facility] on 12/13/2019 for auditory hallucinations.  She was evaluated overnight.  Utox was positive for amphetamines, opiates, benzo and methadone.  She was seen in the ED at [facility] on 12/16/2019 for intractable headache  She has had multiple head imaging, personally reviewed: 03/07/2016 MRI BRAIN WO: Negative motion degraded noncontrast MRI head.  07/22/2018 CT HEAD WO:  Unremarkable noncontrast head CT. 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 No other evident mass. Brain parenchyma appears unremarkable without evidence suggesting acute infarct. No hemorrhage.   Current NSAIDS:  *** Current analgesics:  Fioricet Current triptans:  none Current ergotamine:  none Current anti-emetic: none Current muscle relaxants:  none Current anti-anxiolytic:  alprazolam Current sleep aide:  none Current Antihypertensive medications:  Lisinopril 10mg  Current Antidepressant medications:  Trintellix Current Anticonvulsant medications:  Depakote ER 500mg  at bedtime Current anti-CGRP:  none Current Vitamins/Herbal/Supplements:  none Current Antihistamines/Decongestants:  none Other therapy:  *** Hormone/birth  control:  none  Past NSAIDS:  naproxen Past analgesics:  *** Past abortive triptans:  *** Past abortive ergotamine:  none Past muscle relaxants:  Baclofen, cyclobenzaprine Past anti-emetic:  promethazine Past antihypertensive medications:  none Past antidepressant medications:  Citalopram, mirtazapine, paroxetine Past anticonvulsant medications:  *** Past anti-CGRP:  none Past vitamins/Herbal/Supplements:  none Past antihistamines/decongestants:  Benadryl Other past therapies:  ***  Caffeine:  *** Alcohol:  *** Smoker:  *** Diet:  *** Exercise:  *** Depression:  ***; Anxiety:  *** Other pain:  *** Sleep hygiene:  *** Family history of headache:  ***   PAST MEDICAL HISTORY: Past Medical History:  Diagnosis Date  . Anxiety   . Back pain   . Bipolar 1 disorder ([place])   . COPD (chronic obstructive pulmonary disease) ([place])   . Hypertension   . IBS (irritable bowel syndrome)     PAST SURGICAL HISTORY: Past Surgical History:  Procedure Laterality Date  . ABDOMINAL HYSTERECTOMY    . BLADDER SURGERY  2015  . CHOLECYSTECTOMY      MEDICATIONS: Current Outpatient Medications on File Prior to Visit  Medication Sig Dispense Refill  . ALPRAZolam (XANAX) 1 MG tablet Take 1 mg by mouth 4 (four) times daily as needed.    . baclofen (LIORESAL) 10 MG tablet TK 1 T PO TID PRN    . butalbital-acetaminophen-caffeine (FIORICET) 50-325-40 MG tablet Take 1 tablet by mouth every 6 (six) hours as needed for headache or migraine. 10 tablet 0  . Calcium Polycarbophil (FIBER-CAPS PO) Take 1 capsule by mouth daily.     . divalproex (DEPAKOTE ER) 500 MG 24 hr tablet Take by mouth daily. Patient reports that she take 2 (500 mg) at bedtime.    Marland Kitchen HYDROcodone-acetaminophen (NORCO) 10-325 MG tablet Take 1  tablet by mouth every 6 (six) hours as needed for moderate pain (For Neck and Back Pain).    Marland Kitchen lisinopril (ZESTRIL) 10 MG tablet Take 1 tablet (10 mg total) by mouth daily. (Patient taking  differently: Take 10 mg by mouth daily as needed. For high blood pressure) 90 tablet 1  . nystatin (MYCOSTATIN) 100000 UNIT/ML suspension Take 5 mLs (500,000 Units total) by mouth 4 (four) times daily. (Patient not taking: Reported on 12/10/2019) 60 mL 0  . promethazine (PHENERGAN) 25 MG tablet Take 1 tablet (25 mg total) by mouth every 6 (six) hours as needed for nausea or vomiting. (Patient not taking: Reported on 12/10/2019) 30 tablet 0  . sodium chloride (OCEAN) 0.65 % SOLN nasal spray Place 1 spray into both nostrils as needed for congestion. 30 mL 2  . vortioxetine HBr (TRINTELLIX) 20 MG TABS tablet Take 1 tablet (20 mg total) by mouth daily. 90 tablet 1   No current facility-administered medications on file prior to visit.    ALLERGIES: Allergies  Allergen Reactions  . Amoxicillin Nausea And Vomiting  . Chantix [Varenicline] Other (See Comments)    Causes bad nightmares, hallucinations  . Codeine Other (See Comments)    Shaking   . Divalproex Sodium Other (See Comments)    sedation  . Lithium Other (See Comments)    Drowsiness, tremors drowsiness  . Propoxyphene Nausea And Vomiting  . Augmentin [Amoxicillin-Pot Clavulanate] Diarrhea and Other (See Comments)  . Prednisone     Moodiness  . Zolpidem Other (See Comments)    Patient states it does not agree with her body.     FAMILY HISTORY: Family History  Problem Relation Age of Onset  . Renal Disease Mother   . Heart attack Father    ***.  SOCIAL HISTORY: Social History   Socioeconomic History  . Marital status: Married    Spouse name: Not on file  . Number of children: 3  . Years of education: 62  . Highest education level: GED or equivalent  Occupational History  . Not on file  Tobacco Use  . Smoking status: Current Every Day Smoker    Packs/day: 1.00    Types: Cigarettes  . Smokeless tobacco: Never Used  Vaping Use  . Vaping Use: Never used  Substance and Sexual Activity  . Alcohol use: No  . Drug use: No   . Sexual activity: Not Currently    Birth control/protection: Surgical  Other Topics Concern  . Not on file  Social History Narrative  . Not on file   Social Determinants of Health   Financial Resource Strain: Low Risk   . Difficulty of Paying Living Expenses: Not very hard  Food Insecurity: No Food Insecurity  . Worried About Charity fundraiser in the Last Year: Never true  . Ran Out of Food in the Last Year: Never true  Transportation Needs: No Transportation Needs  . Lack of Transportation (Medical): No  . Lack of Transportation (Non-Medical): No  Physical Activity: Inactive  . Days of Exercise per Week: 0 days  . Minutes of Exercise per Session: 0 min  Stress: No Stress Concern Present  . Feeling of Stress : Only a little  Social Connections: Socially Isolated  . Frequency of Communication with Friends and Family: More than three times a week  . Frequency of Social Gatherings with Friends and Family: More than three times a week  . Attends Religious Services: Never  . Active Member of Clubs or Organizations: No  .  Attends Archivist Meetings: Never  . Marital Status: Divorced  Human resources officer Violence: Not At Risk  . Fear of Current or Ex-Partner: No  . Emotionally Abused: No  . Physically Abused: No  . Sexually Abused: No    REVIEW OF SYSTEMS: Constitutional: No fevers, chills, or sweats, no generalized fatigue, change in appetite Eyes: No visual changes, double vision, eye pain Ear, nose and throat: No hearing loss, ear pain, nasal congestion, sore throat Cardiovascular: No chest pain, palpitations Respiratory:  No shortness of breath at rest or with exertion, wheezes GastrointestinaI: No nausea, vomiting, diarrhea, abdominal pain, fecal incontinence Genitourinary:  No dysuria, urinary retention or frequency Musculoskeletal:  No neck pain, back pain Integumentary: No rash, pruritus, skin lesions Neurological: as above Psychiatric: No depression,  insomnia, anxiety Endocrine: No palpitations, fatigue, diaphoresis, mood swings, change in appetite, change in weight, increased thirst Hematologic/Lymphatic:  No purpura, petechiae. Allergic/Immunologic: no itchy/runny eyes, nasal congestion, recent allergic reactions, rashes  PHYSICAL EXAM: *** General: No acute distress.  Patient appears ***-groomed.  *** Head:  Normocephalic/atraumatic Eyes:  fundi examined but not visualized Neck: supple, no paraspinal tenderness, full range of motion Back: No paraspinal tenderness Heart: regular rate and rhythm Lungs: Clear to auscultation bilaterally. Vascular: No carotid bruits. Neurological Exam: Mental status: alert and oriented to person, place, and time, recent and remote memory intact, fund of knowledge intact, attention and concentration intact, speech fluent and not dysarthric, language intact. Cranial nerves: CN I: not tested CN II: pupils equal, round and reactive to light, visual fields intact CN III, IV, VI:  full range of motion, no nystagmus, no ptosis CN V: facial sensation intact CN VII: upper and lower face symmetric CN VIII: hearing intact CN IX, X: gag intact, uvula midline CN XI: sternocleidomastoid and trapezius muscles intact CN XII: tongue midline Bulk & Tone: normal, no fasciculations. Motor:  5/5 throughout *** Sensation:  Pinprick *** temperature *** and vibration sensation intact.  ***. Deep Tendon Reflexes:  2+ throughout, *** toes downgoing.  *** Finger to nose testing:  Without dysmetria.  *** Heel to shin:  Without dysmetria.  *** Gait:  Normal station and stride.  Able to turn and tandem walk. [place] ***.  IMPRESSION: ***  PLAN: ***  Thank you for allowing me to take part in the care of this patient.  Metta Clines, DO  CC: ***

## 2019-12-23 ENCOUNTER — Ambulatory Visit: Payer: Medicare Other | Admitting: Neurology

## 2019-12-27 ENCOUNTER — Telehealth (HOSPITAL_COMMUNITY): Payer: Self-pay | Admitting: Student

## 2019-12-27 ENCOUNTER — Other Ambulatory Visit: Payer: Self-pay

## 2019-12-27 ENCOUNTER — Ambulatory Visit (HOSPITAL_COMMUNITY)
Admission: EM | Admit: 2019-12-27 | Discharge: 2019-12-27 | Disposition: A | Discharge status: Home / Self Care | Payer: Medicare Other | Attending: Emergency Medicine | Admitting: Emergency Medicine

## 2019-12-27 ENCOUNTER — Encounter (HOSPITAL_COMMUNITY): Payer: Self-pay

## 2019-12-27 DIAGNOSIS — R05 Cough: Secondary | ICD-10-CM | POA: Insufficient documentation

## 2019-12-27 DIAGNOSIS — R059 Cough, unspecified: Secondary | ICD-10-CM

## 2019-12-27 DIAGNOSIS — R300 Dysuria: Secondary | ICD-10-CM | POA: Diagnosis not present

## 2019-12-27 DIAGNOSIS — R0981 Nasal congestion: Secondary | ICD-10-CM | POA: Diagnosis present

## 2019-12-27 DIAGNOSIS — R519 Headache, unspecified: Secondary | ICD-10-CM | POA: Insufficient documentation

## 2019-12-27 DIAGNOSIS — N3000 Acute cystitis without hematuria: Secondary | ICD-10-CM | POA: Diagnosis present

## 2019-12-27 LAB — POCT URINALYSIS DIPSTICK, ED / UC
Bilirubin Urine: NEGATIVE
Glucose, UA: 100 mg/dL — AB
Hgb urine dipstick: NEGATIVE
Ketones, ur: NEGATIVE mg/dL
Leukocytes,Ua: NEGATIVE
Nitrite: POSITIVE — AB
Protein, ur: NEGATIVE mg/dL
Specific Gravity, Urine: 1.015 (ref 1.005–1.030)
Urobilinogen, UA: 0.2 mg/dL (ref 0.0–1.0)
pH: 7 (ref 5.0–8.0)

## 2019-12-27 MED ORDER — FLUCONAZOLE 150 MG PO TABS: 150.0000 mg | ORAL_TABLET | Freq: Every day | ORAL | 0 refills | Status: DC

## 2019-12-27 MED ORDER — SULFAMETHOXAZOLE-TRIMETHOPRIM 800-160 MG PO TABS: 1.0000 | ORAL_TABLET | Freq: Two times a day (BID) | ORAL | 0 refills | Status: AC

## 2019-12-27 MED ORDER — BENZONATATE 100 MG PO CAPS
100.0000 mg | ORAL_CAPSULE | Freq: Three times a day (TID) | ORAL | 0 refills | Status: DC
Start: 2019-12-27 — End: 2019-12-27

## 2019-12-27 MED ORDER — ONDANSETRON 4 MG PO TBDP
4.0000 mg | ORAL_TABLET | Freq: Once | ORAL | Status: AC
  Administered 2019-12-27: 4 mg via ORAL

## 2019-12-27 MED ORDER — KETOROLAC TROMETHAMINE 30 MG/ML IJ SOLN
30.0000 mg | Freq: Once | INTRAMUSCULAR | Status: AC
  Administered 2019-12-27: 30 mg via INTRAMUSCULAR

## 2019-12-27 MED ORDER — ONDANSETRON 4 MG PO TBDP
ORAL_TABLET | ORAL | Status: AC
  Filled 2019-12-27: qty 1

## 2019-12-27 MED ORDER — CETIRIZINE HCL 10 MG PO TABS: 10.0000 mg | ORAL_TABLET | Freq: Every day | ORAL | 0 refills | Status: DC

## 2019-12-27 MED ORDER — DEXAMETHASONE SODIUM PHOSPHATE 10 MG/ML IJ SOLN
INTRAMUSCULAR | Status: AC
  Filled 2019-12-27: qty 1

## 2019-12-27 MED ORDER — FLUCONAZOLE 150 MG PO TABS
150.0000 mg | ORAL_TABLET | Freq: Every day | ORAL | 0 refills | Status: DC
Start: 2019-12-27 — End: 2019-12-27

## 2019-12-27 MED ORDER — DEXAMETHASONE SODIUM PHOSPHATE 10 MG/ML IJ SOLN: 10.0000 mg | Freq: Once | INTRAMUSCULAR | Status: DC

## 2019-12-27 MED ORDER — BENZONATATE 100 MG PO CAPS: 100.0000 mg | ORAL_CAPSULE | Freq: Three times a day (TID) | ORAL | 0 refills | Status: DC

## 2019-12-27 MED ORDER — CETIRIZINE HCL 10 MG PO TABS
10.0000 mg | ORAL_TABLET | Freq: Every day | ORAL | 0 refills | Status: DC
Start: 2019-12-27 — End: 2019-12-27

## 2019-12-27 MED ORDER — SULFAMETHOXAZOLE-TRIMETHOPRIM 800-160 MG PO TABS
1.0000 | ORAL_TABLET | Freq: Two times a day (BID) | ORAL | 0 refills | Status: DC
Start: 2019-12-27 — End: 2019-12-27

## 2019-12-27 MED ORDER — KETOROLAC TROMETHAMINE 30 MG/ML IJ SOLN
INTRAMUSCULAR | Status: AC
  Filled 2019-12-27: qty 1

## 2019-12-27 NOTE — Telephone Encounter (Signed)
Patient was seen at [facility] urgent care earlier today and had prescriptions for Flonase, Tessalon, Bactrim and Diflucan sent in after visit with PA [place] Hall-Potvin.  Patient came into the emergency department because she realized that these prescriptions were sent to her pharmacy in [place] which is closed and she wanted to be able to pick them up this evening, but the urgent care was already closed and she could not call back to have these resent.  I have printed patient's prescriptions for her so that they can be taken to a 24-hour pharmacy.

## 2019-12-27 NOTE — Discharge Instructions (Signed)
You were given medications today for your headache. Important to keep a log of your headaches: When they start, what alleviates them, pain on a scale of 1-10. Bring your headache log to your primary care for further evaluation as you may need to be on medications to help prevent headaches. Go to ER for worse headache of life, loss/change of vision, vomiting, fever, ear ringing, dizziness, weakness, facial droop/slurred speech, severe abdominal pain. 

## 2019-12-27 NOTE — ED Triage Notes (Addendum)
Pt presents with burning when urinating and lower back pain x 2 days. Denies fever, nausea, chills. AZO gives some relief.   Pt reports cough and nasal congestion x 1 day. Pt do not want a  COVID test.

## 2019-12-27 NOTE — ED Provider Notes (Signed)
[facility]    CSN: [id number] Arrival date & time: 12/27/19  1650      History   Chief Complaint Chief Complaint  Patient presents with  . Dysuria  . Back Pain  . Cough  . Nasal Congestion    HPI [name] is a 44 y.o. female  Presents with multiple concerns: Noting burning with urination and lower back pain for the last 2 days.  No fever, abdominal pain, vaginal discharge or pelvic pain, blood in urine.  Azo has provided some relief.  Patient also notes URI symptoms including dry cough, nasal congestion since yesterday.  Associated with frontal headache that is mild: Denies 1 currently in office.  Declines Covid test as she does not have known exposures.  Has not take anything for symptoms.  Denies shortness of breath or chest pain.  Past Medical History:  Diagnosis Date  . Anxiety   . Back pain   . Bipolar 1 disorder ([place])   . COPD (chronic obstructive pulmonary disease) ([place])   . Hypertension   . IBS (irritable bowel syndrome)     Patient Active Problem List   Diagnosis Date Noted  . Chronic obstructive pulmonary disease ([place]) 11/28/2019  . Primary insomnia 11/28/2019  . Acute respiratory failure with hypoxia ([place]) 11/28/2019  . Major depressive disorder, recurrent episode ([place]) 11/13/2018  . GAD (generalized anxiety disorder) 08/23/2018  . Hypotension 08/19/2018  . Anemia 08/19/2018  . Hypokalemia 08/19/2018  . Tobacco abuse 08/19/2018  . Polypharmacy 03/05/2016  . Chronic pain 03/05/2016  . Bipolar disorder ([place]) 03/05/2016    Past Surgical History:  Procedure Laterality Date  . ABDOMINAL HYSTERECTOMY    . BLADDER SURGERY  2015  . CHOLECYSTECTOMY      OB History   No obstetric history on file.      Home Medications    Prior to Admission medications   Medication Sig Start Date End Date Taking? Authorizing Provider  ALPRAZolam Duanne Moron) 1 MG tablet Take 1 mg by mouth 4 (four) times daily as needed. 09/09/19   [provider]  baclofen (LIORESAL) 10 MG tablet TK 1 T PO TID PRN 11/28/18   [provider]  benzonatate (TESSALON) 100 MG capsule Take 1 capsule (100 mg total) by mouth every 8 (eight) hours. 12/27/19   Jacqlyn Larsen, PA-C  butalbital-acetaminophen-caffeine (FIORICET) [phone] MG tablet Take 1 tablet by mouth every 6 (six) hours as needed for headache or migraine. 12/16/19 12/15/20  Hassell Done, Mary-Margaret, FNP  Calcium Polycarbophil (FIBER-CAPS PO) Take 1 capsule by mouth daily.     [provider]  cetirizine (ZYRTEC ALLERGY) 10 MG tablet Take 1 tablet (10 mg total) by mouth daily. 12/27/19   Jacqlyn Larsen, PA-C  divalproex (DEPAKOTE ER) 500 MG 24 hr tablet Take by mouth daily. Patient reports that she take 2 (500 mg) at bedtime.    [provider]  fluconazole (DIFLUCAN) 150 MG tablet Take 1 tablet (150 mg total) by mouth daily. May repeat in 72 hours if needed 12/27/19   Jacqlyn Larsen, PA-C  HYDROcodone-acetaminophen [facility]) 10-325 MG tablet Take 1 tablet by mouth every 6 (six) hours as needed for moderate pain (For Neck and Back Pain).    [provider]  lisinopril (ZESTRIL) 10 MG tablet Take 1 tablet (10 mg total) by mouth daily. Patient taking differently: Take 10 mg by mouth daily as needed. For high blood pressure 10/10/18   Hassell Done, Mary-Margaret, FNP  nystatin (MYCOSTATIN) 100000 UNIT/ML  suspension Take 5 mLs (500,000 Units total) by mouth 4 (four) times daily. Patient not taking: Reported on 12/10/2019 05/12/19   Chevis Pretty, FNP  promethazine (PHENERGAN) 25 MG tablet Take 1 tablet (25 mg total) by mouth every 6 (six) hours as needed for nausea or vomiting. Patient not taking: Reported on 12/10/2019 06/17/19   Janora Norlander, DO  sodium chloride (OCEAN) 0.65 % SOLN nasal spray Place 1 spray into both nostrils as needed for congestion. 11/28/19   Ivy Lynn, NP  sulfamethoxazole-trimethoprim (BACTRIM DS) 800-160 MG tablet Take 1 tablet by mouth  2 (two) times daily for 5 days. 12/27/19 01/01/20  Jacqlyn Larsen, PA-C  vortioxetine HBr (TRINTELLIX) 20 MG TABS tablet Take 1 tablet (20 mg total) by mouth daily. 11/16/18   Johnn Hai, MD    Family History Family History  Problem Relation Age of Onset  . Renal Disease Mother   . Heart attack Father     Social History Social History   Tobacco Use  . Smoking status: Current Every Day Smoker    Packs/day: 1.00    Types: Cigarettes  . Smokeless tobacco: Never Used  Vaping Use  . Vaping Use: Never used  Substance Use Topics  . Alcohol use: No  . Drug use: No     Allergies   Amoxicillin, Chantix [varenicline], Codeine, Divalproex sodium, Lithium, Propoxyphene, Other, Augmentin [amoxicillin-pot clavulanate], Prednisone, and Zolpidem   Review of Systems As per HPI   Physical Exam Triage Vital Signs ED Triage Vitals  Enc Vitals Group     BP      Pulse      Resp      Temp      Temp src      SpO2      Weight      Height      Head Circumference      Peak Flow      Pain Score      Pain Loc      Pain Edu?      Excl. in [place]?    No data found.  Updated Vital Signs BP (!) 128/99 (BP Location: Left Arm)   Pulse 99   Temp 99.1 F (37.3 C) (Oral)   Resp 18   SpO2 95%   Visual Acuity Right Eye Distance:   Left Eye Distance:   Bilateral Distance:    Right Eye Near:   Left Eye Near:    Bilateral Near:     Physical Exam Constitutional:      General: She is not in acute distress. HENT:     Head: Normocephalic and atraumatic.  Eyes:     General: No scleral icterus.    Pupils: Pupils are equal, round, and reactive to light.  Cardiovascular:     Rate and Rhythm: Normal rate.  Pulmonary:     Effort: Pulmonary effort is normal.  Abdominal:     General: Bowel sounds are normal.     Palpations: Abdomen is soft.     Tenderness: There is no abdominal tenderness. There is no right CVA tenderness, left CVA tenderness or guarding.  Genitourinary:    Comments:  Patient declined, self-swab performed Skin:    Coloration: Skin is not jaundiced or pale.  Neurological:     Mental Status: She is alert and oriented to person, place, and time.      UC Treatments / Results  Labs (all labs ordered are listed, but only abnormal results are displayed) Labs  Reviewed  POCT URINALYSIS DIPSTICK, ED / UC - Abnormal; Notable for the following components:      Result Value   Glucose, UA 100 (*)    Nitrite POSITIVE (*)    All other components within normal limits  URINE CULTURE    EKG   Radiology No results found.  Procedures Procedures (including critical care time)  Medications Ordered in UC Medications  ketorolac (TORADOL) 30 MG/ML injection 30 mg (30 mg Intramuscular Given 12/27/19 1822)  ondansetron (ZOFRAN-ODT) disintegrating tablet 4 mg (4 mg Oral Given 12/27/19 1822)    Initial Impression / Assessment and Plan / UC Course  I have reviewed the triage vital signs and the nursing notes.  Pertinent labs & imaging results that were available during my care of the patient were reviewed by me and considered in my medical decision making (see chart for details).     Urine with positive nitrates, glucose.  Given symptoms, will start Bactrim which patient has tolerated well in the past and is requesting by name.  Will treat URI symptoms supportively.  Patient declined Covid testing.  Return precautions discussed, pt verbalized understanding and is agreeable to plan. Final Clinical Impressions(s) / UC Diagnoses   Final diagnoses:  Acute cystitis without hematuria  Bad headache  Nasal congestion  Cough     Discharge Instructions     You were given medications today for your headache. Important to keep a log of your headaches: When they start, what alleviates them, pain on a scale of 1-10. Bring your headache log to your primary care for further evaluation as you may need to be on medications to help prevent headaches. Go to ER for worse  headache of life, loss/change of vision, vomiting, fever, ear ringing, dizziness, weakness, facial droop/slurred speech, severe abdominal pain.    ED Prescriptions    Medication Sig Dispense Auth. Provider   sulfamethoxazole-trimethoprim (BACTRIM DS) 800-160 MG tablet  (Status: Discontinued) Take 1 tablet by mouth 2 (two) times daily for 5 days. 10 tablet Hall-Potvin, [place], PA-C   fluconazole (DIFLUCAN) 150 MG tablet  (Status: Discontinued) Take 1 tablet (150 mg total) by mouth daily. May repeat in 72 hours if needed 2 tablet Hall-Potvin, [place], PA-C   cetirizine (ZYRTEC ALLERGY) 10 MG tablet  (Status: Discontinued) Take 1 tablet (10 mg total) by mouth daily. 30 tablet Hall-Potvin, [place], PA-C   benzonatate (TESSALON) 100 MG capsule  (Status: Discontinued) Take 1 capsule (100 mg total) by mouth every 8 (eight) hours. 21 capsule Hall-Potvin, [place], PA-C   benzonatate (TESSALON) 100 MG capsule Take 1 capsule (100 mg total) by mouth every 8 (eight) hours. 21 capsule Benedetto Goad N, PA-C   cetirizine (ZYRTEC ALLERGY) 10 MG tablet Take 1 tablet (10 mg total) by mouth daily. 30 tablet Benedetto Goad N, PA-C   fluconazole (DIFLUCAN) 150 MG tablet Take 1 tablet (150 mg total) by mouth daily. May repeat in 72 hours if needed 2 tablet Ford, Kelsey N, PA-C   sulfamethoxazole-trimethoprim (BACTRIM DS) 800-160 MG tablet Take 1 tablet by mouth 2 (two) times daily for 5 days. 10 tablet Jacqlyn Larsen, [place]     PDMP not reviewed this encounter.   Hall-Potvin, [place], [place] 12/28/19 [phone]

## 2019-12-27 NOTE — ED Notes (Signed)
Provider notified of pt declination of steroid injection.

## 2019-12-28 ENCOUNTER — Emergency Department (HOSPITAL_COMMUNITY)
Admission: EM | Admit: 2019-12-28 | Discharge: 2019-12-29 | Disposition: A | Discharge status: Home / Self Care | Payer: Medicare Other | Attending: Emergency Medicine | Admitting: Emergency Medicine

## 2019-12-28 ENCOUNTER — Encounter (HOSPITAL_COMMUNITY): Payer: Self-pay | Admitting: Emergency Medicine

## 2019-12-28 ENCOUNTER — Other Ambulatory Visit: Payer: Self-pay

## 2019-12-28 DIAGNOSIS — Z5321 Procedure and treatment not carried out due to patient leaving prior to being seen by health care provider: Secondary | ICD-10-CM | POA: Diagnosis not present

## 2019-12-28 DIAGNOSIS — R339 Retention of urine, unspecified: Secondary | ICD-10-CM | POA: Diagnosis not present

## 2019-12-28 DIAGNOSIS — R509 Fever, unspecified: Secondary | ICD-10-CM | POA: Insufficient documentation

## 2019-12-28 LAB — CBC
HCT: 43.6 % (ref 36.0–46.0)
Hemoglobin: 13.7 g/dL (ref 12.0–15.0)
MCH: 29.7 pg (ref 26.0–34.0)
MCHC: 31.4 g/dL (ref 30.0–36.0)
MCV: 94.4 fL (ref 80.0–100.0)
Platelets: 166 10*3/uL (ref 150–400)
RBC: 4.62 MIL/uL (ref 3.87–5.11)
RDW: 13.6 % (ref 11.5–15.5)
WBC: 7.1 10*3/uL (ref 4.0–10.5)
nRBC: 0 % (ref 0.0–0.2)

## 2019-12-28 LAB — BASIC METABOLIC PANEL
Anion gap: 12 (ref 5–15)
BUN: 10 mg/dL (ref 6–20)
CO2: 22 mmol/L (ref 22–32)
Calcium: 8.7 mg/dL — ABNORMAL LOW (ref 8.9–10.3)
Chloride: 106 mmol/L (ref 98–111)
Creatinine, Ser: 0.97 mg/dL (ref 0.44–1.00)
GFR calc Af Amer: >60 mL/min (ref >60)
GFR calc non Af Amer: >60 mL/min (ref >60)
Glucose, Bld: 98 mg/dL (ref 70–99)
Potassium: 3.6 mmol/L (ref 3.5–5.1)
Sodium: 140 mmol/L (ref 135–145)

## 2019-12-28 LAB — I-STAT BETA HCG BLOOD, ED (MC, WL, AP ONLY): I-stat hCG, quantitative: <5 m[IU]/mL (ref <5)

## 2019-12-28 NOTE — ED Notes (Signed)
Pt states, "I need to have a catheter now!"  Explained the wait to pt.  Pt states, "Whoever caths me, I AM TAKING the catheter home with me!"  Pt continues to wear her mask under her nose after being asked multiple times to pull mask up due to COVID.

## 2019-12-28 NOTE — ED Triage Notes (Signed)
Pt states she has been taking antibiotic for UTI x 2 days.  Reports urinary retention.  States since 5pm she has pressure like she needs to urinate but has been unable to fully empty her bladder.  Reports fever and chills.  Pt states, "I don't have COVID either."

## 2019-12-28 NOTE — ED Notes (Signed)
Patient told multiple times to keep mask on and to stay out the covid box.

## 2019-12-29 NOTE — ED Notes (Signed)
Pt in lobby making threats to staff and hostile towards other patients in the lobby, pt told staff that she was going to have someone "come up here and wipe everyone out" to the tech in the lobby, security and [employer] notified, pt asked to leave the premises by security and [employer] due to communicating threats.

## 2019-12-30 ENCOUNTER — Ambulatory Visit (INDEPENDENT_AMBULATORY_CARE_PROVIDER_SITE_OTHER): Payer: Medicare Other | Admitting: Family Medicine

## 2019-12-30 ENCOUNTER — Encounter: Payer: Self-pay | Admitting: Family Medicine

## 2019-12-30 ENCOUNTER — Other Ambulatory Visit: Payer: Self-pay

## 2019-12-30 VITALS — BP 119/67 | HR 87 | Temp 98.2°F | Ht 62.0 in | Wt 127.1 lb

## 2019-12-30 DIAGNOSIS — R339 Retention of urine, unspecified: Secondary | ICD-10-CM

## 2019-12-30 LAB — URINE CULTURE: Culture: >=100000 — AB

## 2019-12-30 MED ORDER — TAMSULOSIN HCL 0.4 MG PO CAPS: 0.4000 mg | ORAL_CAPSULE | Freq: Every day | ORAL | 0 refills | Status: DC

## 2019-12-30 NOTE — Patient Instructions (Signed)
Acute Urinary Retention, Female  Acute urinary retention means that you cannot pee (urinate) at all, or that you pee too little and your bladder is not emptied completely. If it is not treated, it can lead to kidney damage or other serious problems. Follow these instructions at home:  Take over-the-counter and prescription medicines only as told by your doctor. Ask your doctor what medicines you should stay away from. Do not take any medicine unless your doctor says it is okay to do so.  If you were sent home with a tube that drains pee from the bladder (catheter), take care of it as told by your doctor.  Drink enough fluid to keep your pee clear or pale yellow.  If you were given an antibiotic, take it as told by your doctor. Do not stop taking the antibiotic even if you start to feel better.  Do not use any products that contain nicotine or tobacco, such as cigarettes and e-cigarettes. If you need help quitting, ask your doctor.  Watch for changes in your symptoms. Tell your doctor about them.  If told, keep track of any changes in your blood pressure at home. Tell your doctor about them.  Keep all follow-up visits as told by your doctor. This is important. Contact a doctor if:  You have spasms or you leak pee when you have spasms. Get help right away if:  You have chills or a fever.  You have blood in your pee.  You have a tube that drains the bladder and: ? The tube stops draining pee. ? The tube falls out. Summary  Acute urinary retention means that you cannot pee at all, or that you pee too little and your bladder is not emptied completely. If it is not treated, it can result in kidney damage or other serious problems.  If you were sent home with a tube that drains pee from the bladder, take care of it as told by your doctor.  Pay attention to any changes in your symptoms. Tell your doctor about them. This information is not intended to replace advice given to you by your  health care provider. Make sure you discuss any questions you have with your health care provider. Document Revised: 03/02/2017 Document Reviewed: 04/21/2016 Elsevier Patient Education  2020 [employer]  

## 2019-12-30 NOTE — Progress Notes (Signed)
Subjective: CC: urinary retention PCP: Chevis Pretty, FNP  FGH:[id number] F Hattery is a 44 y.o. female presenting to clinic today for:  1. Urinary retention Signe reports that she has been unable to void since 8 pm yesterday.  She reports that she was seen in the ED on 9/25 for a UTI. She also reports being seen in the ED on 9/26 for urinary retention and had to be cathed. She is currently taking bactrim for the UTI. She also reports some nausea and lower abdominal pressure. She has been drinking fluids, and has had at least 2 large bottles of water today. She denies fever, flank pain, vomiting, or blood in her urine. She reports having had a bladder surgery in the past, but is unsure of what type of surgery. Her last BM was last night and denies voiding with the DM.  Relevant past medical, surgical, family, and social history reviewed and updated as indicated.  Allergies and medications reviewed and updated.  Allergies  Allergen Reactions  . Amoxicillin Nausea And Vomiting  . Chantix [Varenicline] Other (See Comments)    Causes bad nightmares, hallucinations  . Codeine Other (See Comments)    Shaking   . Divalproex Sodium Other (See Comments)    sedation  . Lithium Other (See Comments)    Drowsiness, tremors drowsiness  . Propoxyphene Nausea And Vomiting  . Other     Pt states "I cannot take steroids"  . Augmentin [Amoxicillin-Pot Clavulanate] Diarrhea and Other (See Comments)  . Prednisone     Moodiness  . Zolpidem Other (See Comments)    Patient states it does not agree with her body.    Past Medical History:  Diagnosis Date  . Anxiety   . Back pain   . Bipolar 1 disorder ([place])   . COPD (chronic obstructive pulmonary disease) ([place])   . Hypertension   . IBS (irritable bowel syndrome)     Current Outpatient Medications:  .  ALPRAZolam (XANAX) 1 MG tablet, Take 1 mg by mouth 4 (four) times daily as needed., Disp: , Rfl:  .  baclofen (LIORESAL) 10 MG tablet,  TK 1 T PO TID PRN, Disp: , Rfl:  .  benzonatate (TESSALON) 100 MG capsule, Take 1 capsule (100 mg total) by mouth every 8 (eight) hours., Disp: 21 capsule, Rfl: 0 .  butalbital-acetaminophen-caffeine (FIORICET) 50-325-40 MG tablet, Take 1 tablet by mouth every 6 (six) hours as needed for headache or migraine., Disp: 10 tablet, Rfl: 0 .  Calcium Polycarbophil (FIBER-CAPS PO), Take 1 capsule by mouth daily. , Disp: , Rfl:  .  cetirizine (ZYRTEC ALLERGY) 10 MG tablet, Take 1 tablet (10 mg total) by mouth daily., Disp: 30 tablet, Rfl: 0 .  divalproex (DEPAKOTE ER) 500 MG 24 hr tablet, Take by mouth daily. Patient reports that she take 2 (500 mg) at bedtime., Disp: , Rfl:  .  fluconazole (DIFLUCAN) 150 MG tablet, Take 1 tablet (150 mg total) by mouth daily. May repeat in 72 hours if needed, Disp: 2 tablet, Rfl: 0 .  HYDROcodone-acetaminophen (NORCO) 10-325 MG tablet, Take 1 tablet by mouth every 6 (six) hours as needed for moderate pain (For Neck and Back Pain)., Disp: , Rfl:  .  lisinopril (ZESTRIL) 10 MG tablet, Take 1 tablet (10 mg total) by mouth daily. (Patient taking differently: Take 10 mg by mouth daily as needed. For high blood pressure), Disp: 90 tablet, Rfl: 1 .  nystatin (MYCOSTATIN) 100000 UNIT/ML suspension, Take 5 mLs (500,000 Units total) by  mouth 4 (four) times daily., Disp: 60 mL, Rfl: 0 .  promethazine (PHENERGAN) 25 MG tablet, Take 1 tablet (25 mg total) by mouth every 6 (six) hours as needed for nausea or vomiting., Disp: 30 tablet, Rfl: 0 .  sulfamethoxazole-trimethoprim (BACTRIM DS) 800-160 MG tablet, Take 1 tablet by mouth 2 (two) times daily for 5 days., Disp: 10 tablet, Rfl: 0 .  vortioxetine HBr (TRINTELLIX) 20 MG TABS tablet, Take 1 tablet (20 mg total) by mouth daily., Disp: 90 tablet, Rfl: 1 Social History   Socioeconomic History  . Marital status: Married    Spouse name: Not on file  . Number of children: 3  . Years of education: 87  . Highest education level: GED or  equivalent  Occupational History  . Not on file  Tobacco Use  . Smoking status: Current Every Day Smoker    Packs/day: 1.00    Types: Cigarettes  . Smokeless tobacco: Never Used  Vaping Use  . Vaping Use: Never used  Substance and Sexual Activity  . Alcohol use: No  . Drug use: No  . Sexual activity: Not Currently    Birth control/protection: Surgical  Other Topics Concern  . Not on file  Social History Narrative  . Not on file   Social Determinants of Health   Financial Resource Strain: Low Risk   . Difficulty of Paying Living Expenses: Not very hard  Food Insecurity: No Food Insecurity  . Worried About Charity fundraiser in the Last Year: Never true  . Ran Out of Food in the Last Year: Never true  Transportation Needs: No Transportation Needs  . Lack of Transportation (Medical): No  . Lack of Transportation (Non-Medical): No  Physical Activity: Inactive  . Days of Exercise per Week: 0 days  . Minutes of Exercise per Session: 0 min  Stress: No Stress Concern Present  . Feeling of Stress : Only a little  Social Connections: Socially Isolated  . Frequency of Communication with Friends and Family: More than three times a week  . Frequency of Social Gatherings with Friends and Family: More than three times a week  . Attends Religious Services: Never  . Active Member of Clubs or Organizations: No  . Attends Archivist Meetings: Never  . Marital Status: Divorced  Human resources officer Violence: Not At Risk  . Fear of Current or Ex-Partner: No  . Emotionally Abused: No  . Physically Abused: No  . Sexually Abused: No   Family History  Problem Relation Age of Onset  . Renal Disease Mother   . Heart attack Father     Review of Systems  Per HPI.   Objective: Office vital signs reviewed. BP 119/67   Pulse 87   Temp 98.2 F (36.8 C) (Temporal)   Ht 5\' 2"  (1.575 m)   Wt 127 lb 2 oz (57.7 kg)   BMI 23.25 kg/m   Physical Examination:  Physical Exam Vitals  and nursing note reviewed.  Constitutional:      General: She is not in acute distress.    Appearance: Normal appearance. She is not ill-appearing or toxic-appearing.  Cardiovascular:     Rate and Rhythm: Normal rate and regular rhythm.     Heart sounds: Normal heart sounds. No murmur heard.   Pulmonary:     Effort: Pulmonary effort is normal.     Breath sounds: Normal breath sounds.  Abdominal:     General: Bowel sounds are normal. There is no distension.  Palpations: Abdomen is soft.     Tenderness: There is abdominal tenderness (lower pelvic). There is no right CVA tenderness, left CVA tenderness, guarding or rebound.  Genitourinary:    Urethra: No prolapse, urethral pain, urethral swelling or urethral lesion.  Neurological:     General: No focal deficit present.     Mental Status: She is alert and oriented to person, place, and time.  Psychiatric:        Mood and Affect: Mood is anxious.        Speech: Speech normal.        Behavior: Behavior is cooperative.    Procedures  In and out cath performed with sterile technique. 450 ml yellow urine out. Patient tolerated well with good relief.   Results for orders placed or performed during the hospital encounter of 35/36/14  Basic metabolic panel  Result Value Ref Range   Sodium 140 135 - 145 mmol/L   Potassium 3.6 3.5 - 5.1 mmol/L   Chloride 106 98 - 111 mmol/L   CO2 22 22 - 32 mmol/L   Glucose, Bld 98 70 - 99 mg/dL   BUN 10 6 - 20 mg/dL   Creatinine, Ser 0.97 0.44 - 1.00 mg/dL   Calcium 8.7 (L) 8.9 - 10.3 mg/dL   GFR calc non Af Amer >60 >60 mL/min   GFR calc Af Amer >60 >60 mL/min   Anion gap 12 5 - 15  CBC  Result Value Ref Range   WBC 7.1 4.0 - 10.5 K/uL   RBC 4.62 3.87 - 5.11 MIL/uL   Hemoglobin 13.7 12.0 - 15.0 g/dL   HCT 43.6 36 - 46 %   MCV 94.4 80.0 - 100.0 fL   MCH 29.7 26.0 - 34.0 pg   MCHC 31.4 30.0 - 36.0 g/dL   RDW 13.6 11.5 - 15.5 %   Platelets 166 150 - 400 K/uL   nRBC 0.0 0.0 - 0.2 %  I-Stat  beta hCG blood, ED  Result Value Ref Range   I-stat hCG, quantitative <5.0 <5 mIU/mL   Comment 3             Assessment/ Plan: Diagnoses and all orders for this visit:  Urinary retention ED notes reviewed from 9/25 and 9/26. From ED notes, it appeared that patient was able to void on 9/26 as urine was collected via clean catch rather than catherization. Patient has a significant psych history, but behavior and cognition was appropriate today. Patient was unable to void on her own at visit today. In and out cath today in office with 450 ml of urine. Lower pelvic tenderness resolved following craterization. Urgent referral to urology for urinary retention. Flomax prescribed. Continue Bactrim as prescribed. Handout given.  -     Ambulatory referral to Urology -     tamsulosin (FLOMAX) 0.4 MG CAPS capsule; Take 1 capsule (0.4 mg total) by mouth daily. -     In and Out Cath  Follow up for new or worsening symptoms.   The above assessment and management plan was discussed with the patient. The patient verbalized understanding of and has agreed to the management plan. Patient is aware to call the clinic if symptoms persist or worsen. Patient is aware when to return to the clinic for a follow-up visit. Patient educated on when it is appropriate to go to the emergency department.   Marjorie Smolder, FNP-C [facility] [address] [place], [place] [address] [phone]

## 2020-01-15 ENCOUNTER — Ambulatory Visit: Payer: Medicare Other | Admitting: Physical Therapy

## 2020-01-20 ENCOUNTER — Ambulatory Visit: Payer: Medicare Other | Admitting: Physical Therapy

## 2020-01-21 ENCOUNTER — Other Ambulatory Visit: Payer: Self-pay | Admitting: Family Medicine

## 2020-01-21 DIAGNOSIS — R339 Retention of urine, unspecified: Secondary | ICD-10-CM

## 2020-01-26 ENCOUNTER — Other Ambulatory Visit: Payer: Self-pay | Admitting: Nurse Practitioner

## 2020-01-26 ENCOUNTER — Telehealth: Payer: Self-pay

## 2020-01-26 NOTE — Telephone Encounter (Signed)
  Prescription Request  01/26/2020  What is the name of the medication or equipment? linzess  Have you contacted your pharmacy to request a refill? (if applicable) no new rx pt has apt with MMM 02/16/2020  Which pharmacy would you like this sent to? [facility]   Patient notified that their request is being sent to the clinical staff for review and that they should receive a response within 2 business days.

## 2020-01-26 NOTE — Telephone Encounter (Signed)
No longer on medication list- why was she taking it and why did she stop

## 2020-01-26 NOTE — Telephone Encounter (Signed)
Patient reports that she has not taken Linzess in two or three years, but she is now experiencing constipation again.  She states that she had an abnormal colonoscopy and was supposed to follow up with her GI doctor but never did.  Patient prefers to see a new GI doc.  I scheduled an appointment for patient to be seen with Chevis Pretty on Friday at 2:30 pm.

## 2020-01-30 ENCOUNTER — Ambulatory Visit (INDEPENDENT_AMBULATORY_CARE_PROVIDER_SITE_OTHER): Payer: Medicare Other | Admitting: Nurse Practitioner

## 2020-01-30 ENCOUNTER — Encounter: Payer: Self-pay | Admitting: Nurse Practitioner

## 2020-01-30 ENCOUNTER — Ambulatory Visit (INDEPENDENT_AMBULATORY_CARE_PROVIDER_SITE_OTHER): Payer: Medicare Other

## 2020-01-30 ENCOUNTER — Other Ambulatory Visit: Payer: Self-pay

## 2020-01-30 VITALS — BP 107/72 | HR 89 | Temp 97.7°F | Ht 62.0 in | Wt 134.0 lb

## 2020-01-30 DIAGNOSIS — K5904 Chronic idiopathic constipation: Secondary | ICD-10-CM

## 2020-01-30 MED ORDER — LINACLOTIDE 145 MCG PO CAPS: 145.0000 ug | ORAL_CAPSULE | Freq: Every day | ORAL | 0 refills | Status: DC

## 2020-01-30 NOTE — Progress Notes (Signed)
   Subjective:    Patient ID: [name], female    DOB: [birth year], 44 y.o.   MRN: [id number]   Chief Complaint: Constipation   HPI Patient comes in today c/o constipation. She has a bowel movement 1-2 x a week. Sometimes she has to take laxative. She says that her bowel movements have been black. She has history of colon polyps. She says GI called her stating it was time for colonoscopy and she needs referral.   Review of Systems  Respiratory: Negative.   Cardiovascular: Negative.   Gastrointestinal: Positive for blood in stool (occasional) and constipation. Negative for diarrhea, nausea, rectal pain and vomiting.  Neurological: Positive for headaches.  All other systems reviewed and are negative.      Objective:   Physical Exam Vitals and nursing note reviewed.  Constitutional:      Appearance: Normal appearance.  Cardiovascular:     Rate and Rhythm: Normal rate and regular rhythm.     Heart sounds: Normal heart sounds.  Pulmonary:     Breath sounds: Normal breath sounds.  Abdominal:     General: Bowel sounds are normal. There is distension.     Palpations: Abdomen is soft. There is no mass.     Tenderness: There is no abdominal tenderness. There is no guarding or rebound.     Hernia: No hernia is present.  Skin:    General: Skin is warm and dry.  Neurological:     General: No focal deficit present.     Mental Status: She is alert and oriented to person, place, and time.  Psychiatric:        Mood and Affect: Mood normal.        Behavior: Behavior normal.     BP 107/72   Pulse 89   Temp 97.7 F (36.5 C) (Temporal)   Ht 5\' 2"  (1.575 m)   Wt 134 lb (60.8 kg)   BMI 24.51 kg/m   KUB- moderat amount of stool throughout colon-Preliminary reading by Ronnald Collum, FNP  [facility]      Assessment & Plan:  [name] in today with chief complaint of Constipation   1. Chronic idiopathic constipation Force fluids Increase fiber in diet Keep appointment  with G next week linzess sample- will see if GI wants her to stay on meds or not. - DG Abd 1 View - Ambulatory referral to Gastroenterology - linaclotide (LINZESS) 145 MCG CAPS capsule; Take 1 capsule (145 mcg total) by mouth daily before breakfast.  Dispense: 30 capsule; Refill: 0    The above assessment and management plan was discussed with the patient. The patient verbalized understanding of and has agreed to the management plan. Patient is aware to call the clinic if symptoms persist or worsen. Patient is aware when to return to the clinic for a follow-up visit. Patient educated on when it is appropriate to go to the emergency department.   [name], FNP

## 2020-01-30 NOTE — Patient Instructions (Signed)

## 2020-02-03 ENCOUNTER — Ambulatory Visit: Payer: Medicare Other | Admitting: Neurology

## 2020-02-16 ENCOUNTER — Ambulatory Visit: Payer: Medicare Other | Admitting: Nurse Practitioner

## 2020-02-17 ENCOUNTER — Other Ambulatory Visit: Payer: Self-pay | Admitting: Nurse Practitioner

## 2020-02-17 DIAGNOSIS — R339 Retention of urine, unspecified: Secondary | ICD-10-CM

## 2020-02-18 ENCOUNTER — Encounter: Payer: Self-pay | Admitting: Nurse Practitioner

## 2020-02-23 ENCOUNTER — Other Ambulatory Visit: Payer: Self-pay | Admitting: Nurse Practitioner

## 2020-02-23 DIAGNOSIS — R339 Retention of urine, unspecified: Secondary | ICD-10-CM

## 2020-03-11 ENCOUNTER — Ambulatory Visit: Payer: Medicare Other | Admitting: Nurse Practitioner

## 2020-03-11 ENCOUNTER — Encounter: Payer: Self-pay | Admitting: Nurse Practitioner

## 2020-03-17 ENCOUNTER — Other Ambulatory Visit: Payer: Self-pay

## 2020-03-17 ENCOUNTER — Other Ambulatory Visit: Payer: Medicare Other

## 2020-03-17 ENCOUNTER — Ambulatory Visit (INDEPENDENT_AMBULATORY_CARE_PROVIDER_SITE_OTHER): Payer: Medicare Other | Admitting: Family Medicine

## 2020-03-17 DIAGNOSIS — R5383 Other fatigue: Secondary | ICD-10-CM

## 2020-03-17 DIAGNOSIS — G4734 Idiopathic sleep related nonobstructive alveolar hypoventilation: Secondary | ICD-10-CM

## 2020-03-17 NOTE — Progress Notes (Signed)
Telephone visit  Subjective: CC: fatigue PCP: Chevis Pretty, FNP [id number] F Plotz is a 44 y.o. female calls for telephone consult today. Patient provides verbal consent for consult held via phone.  Due to COVID-19 pandemic this visit was conducted virtually. This visit type was conducted due to national recommendations for restrictions regarding the COVID-19 Pandemic (e.g. social distancing, sheltering in place) in an effort to limit this patient's exposure and mitigate transmission in our community. All issues noted in this document were discussed and addressed.  A physical exam was not performed with this format.   Location of patient: home Location of provider: [facility] Others present for call: none  1. Fatigue Patient reports extreme fatigue for the last 2-3 weeks.  She is dozing off frequently during the day.  She feels like she sleeps at night.  She takes sleeping medication and sometimes feels a little drowsy in am.  She had a sleep O2 overnight with [employer].  This qualified her for O2 but she has not received.  Denies blood loss   ROS: Per HPI  Allergies  Allergen Reactions  . Amoxicillin Nausea And Vomiting  . Chantix [Varenicline] Other (See Comments)    Causes bad nightmares, hallucinations  . Codeine Other (See Comments)    Shaking   . Divalproex Sodium Other (See Comments)    sedation  . Lithium Other (See Comments)    Drowsiness, tremors drowsiness  . Propoxyphene Nausea And Vomiting  . Other     Pt states "I cannot take steroids"  . Augmentin [Amoxicillin-Pot Clavulanate] Diarrhea and Other (See Comments)  . Prednisone     Moodiness  . Zolpidem Other (See Comments)    Patient states it does not agree with her body.    Past Medical History:  Diagnosis Date  . Anxiety   . Back pain   . Bipolar 1 disorder ([place])   . COPD (chronic obstructive pulmonary disease) ([place])   . Hypertension   . IBS (irritable bowel syndrome)     Current Outpatient  Medications:  .  ALPRAZolam (XANAX) 1 MG tablet, Take 1 mg by mouth 4 (four) times daily as needed., Disp: , Rfl:  .  baclofen (LIORESAL) 10 MG tablet, TK 1 T PO TID PRN, Disp: , Rfl:  .  butalbital-acetaminophen-caffeine (FIORICET) 50-325-40 MG tablet, Take 1 tablet by mouth every 6 (six) hours as needed for headache or migraine., Disp: 10 tablet, Rfl: 0 .  Calcium Polycarbophil (FIBER-CAPS PO), Take 1 capsule by mouth daily. , Disp: , Rfl:  .  cetirizine (ZYRTEC ALLERGY) 10 MG tablet, Take 1 tablet (10 mg total) by mouth daily., Disp: 30 tablet, Rfl: 0 .  divalproex (DEPAKOTE ER) 500 MG 24 hr tablet, Take by mouth daily. Patient reports that she take 2 (500 mg) at bedtime., Disp: , Rfl:  .  fluPHENAZine (PROLIXIN) 1 MG tablet, Take 1 mg by mouth at bedtime., Disp: , Rfl:  .  HYDROcodone-acetaminophen (NORCO) 10-325 MG tablet, Take 1 tablet by mouth every 6 (six) hours as needed for moderate pain (For Neck and Back Pain)., Disp: , Rfl:  .  linaclotide (LINZESS) 145 MCG CAPS capsule, Take 1 capsule (145 mcg total) by mouth daily before breakfast., Disp: 30 capsule, Rfl: 0 .  lisinopril (ZESTRIL) 10 MG tablet, Take 1 tablet (10 mg total) by mouth daily. (Patient taking differently: Take 10 mg by mouth daily as needed. For high blood pressure), Disp: 90 tablet, Rfl: 1 .  nystatin (MYCOSTATIN) 100000 UNIT/ML suspension, Take 5  mLs (500,000 Units total) by mouth 4 (four) times daily., Disp: 60 mL, Rfl: 0 .  promethazine (PHENERGAN) 25 MG tablet, Take 1 tablet (25 mg total) by mouth every 6 (six) hours as needed for nausea or vomiting., Disp: 30 tablet, Rfl: 0 .  QUEtiapine (SEROQUEL) 400 MG tablet, Take 800 mg by mouth at bedtime., Disp: , Rfl:  .  tamsulosin (FLOMAX) 0.4 MG CAPS capsule, TAKE 1 CAPSULE BY MOUTH EVERY DAY, Disp: 30 capsule, Rfl: 0 .  vortioxetine HBr (TRINTELLIX) 20 MG TABS tablet, Take 1 tablet (20 mg total) by mouth daily., Disp: 90 tablet, Rfl: 1  Assessment/ Plan: 44 y.o. female    Fatigue, unspecified type - Plan: Thyroid Panel With TSH, CMP14+EGFR, CBC, Vitamin B12  Nocturnal hypoxia  I reviewed her nocturnal O2 report which showed desaturations down to 71%.  Should have qualified her for oxygen and I see the subsequent order from September but I am unsure as to why she has not received her home oxygen.  I have talked to Vinnie Level and she will look into this for her and contact the patient.  I have gone ahead and ordered some metabolic labs to make sure that were not missing a metabolic etiology but I suspect that the hypoxia she is experiencing at nighttime is contributive.  She likely would benefit from a true polysomnogram to make sure that she is not obstructing.  We will also have to make sure that she is not over oxygenated at nighttime given known COPD.  I would worry about CO2 retention in this patient.  She is to follow-up with PCP for ongoing needs  Start time: 1:43pm End time: 1:58pm  Total time spent on patient care (including telephone call/ virtual visit): 15 minutes  [place], [place] [phone]

## 2020-03-17 NOTE — Addendum Note (Signed)
Addended by: Janora Norlander on: 03/17/2020 04:20 PM   Modules accepted: Orders

## 2020-03-18 ENCOUNTER — Encounter: Payer: Self-pay | Admitting: Family Medicine

## 2020-03-18 LAB — CMP14+EGFR
ALT: 14 IU/L (ref 0–32)
AST: 23 IU/L (ref 0–40)
Albumin/Globulin Ratio: 1.5 (ref 1.2–2.2)
Albumin: 4 g/dL (ref 3.8–4.8)
Alkaline Phosphatase: 141 IU/L — ABNORMAL HIGH (ref 44–121)
BUN/Creatinine Ratio: 17 (ref 9–23)
BUN: 12 mg/dL (ref 6–24)
Bilirubin Total: <0.2 mg/dL (ref 0.0–1.2)
CO2: 25 mmol/L (ref 20–29)
Calcium: 9.2 mg/dL (ref 8.7–10.2)
Chloride: 102 mmol/L (ref 96–106)
Creatinine, Ser: 0.71 mg/dL (ref 0.57–1.00)
GFR calc Af Amer: 120 mL/min/{1.73_m2} (ref >59)
GFR calc non Af Amer: 104 mL/min/{1.73_m2} (ref >59)
Globulin, Total: 2.6 g/dL (ref 1.5–4.5)
Glucose: 119 mg/dL — ABNORMAL HIGH (ref 65–99)
Potassium: 4.3 mmol/L (ref 3.5–5.2)
Sodium: 144 mmol/L (ref 134–144)
Total Protein: 6.6 g/dL (ref 6.0–8.5)

## 2020-03-18 LAB — THYROID PANEL WITH TSH
Free Thyroxine Index: 0.9 — ABNORMAL LOW (ref 1.2–4.9)
T3 Uptake Ratio: 23 % — ABNORMAL LOW (ref 24–39)
T4, Total: 3.7 ug/dL — ABNORMAL LOW (ref 4.5–12.0)
TSH: 1.17 u[IU]/mL (ref 0.450–4.500)

## 2020-03-18 LAB — CBC
Hematocrit: 41.3 % (ref 34.0–46.6)
Hemoglobin: 14.2 g/dL (ref 11.1–15.9)
MCH: 30.4 pg (ref 26.6–33.0)
MCHC: 34.4 g/dL (ref 31.5–35.7)
MCV: 88 fL (ref 79–97)
Platelets: 212 10*3/uL (ref 150–450)
RBC: 4.67 x10E6/uL (ref 3.77–5.28)
RDW: 14.1 % (ref 11.7–15.4)
WBC: 7.8 10*3/uL (ref 3.4–10.8)

## 2020-03-18 LAB — VITAMIN B12: Vitamin B-12: 350 pg/mL (ref 232–1245)

## 2020-03-20 ENCOUNTER — Other Ambulatory Visit: Payer: Self-pay | Admitting: Nurse Practitioner

## 2020-04-06 ENCOUNTER — Ambulatory Visit (INDEPENDENT_AMBULATORY_CARE_PROVIDER_SITE_OTHER): Payer: Medicare Other | Admitting: Nurse Practitioner

## 2020-04-06 ENCOUNTER — Encounter: Payer: Self-pay | Admitting: Nurse Practitioner

## 2020-04-06 DIAGNOSIS — R7989 Other specified abnormal findings of blood chemistry: Secondary | ICD-10-CM | POA: Diagnosis not present

## 2020-04-06 NOTE — Progress Notes (Signed)
   Virtual Visit via telephone Note Due to COVID-19 pandemic this visit was conducted virtually. This visit type was conducted due to national recommendations for restrictions regarding the COVID-19 Pandemic (e.g. social distancing, sheltering in place) in an effort to limit this patient's exposure and mitigate transmission in our community. All issues noted in this document were discussed and addressed.  A physical exam was not performed with this format.  I connected with [name] on 04/06/20 at 5:05 by telephone and verified that I am speaking with the correct person using two identifiers. [name] is currently located at home and no one is currently with her during visit. The provider, Mary-Margaret Daphine Deutscher, FNP is located in their office at time of visit.  I discussed the limitations, risks, security and privacy concerns of performing an evaluation and management service by telephone and the availability of in person appointments. I also discussed with the patient that there may be a patient responsible charge related to this service. The patient expressed understanding and agreed to proceed.   History and Present Illness:   Chief Complaint: Thyroid Problem   HPI Patient calls in about her thyroid. She had labs done and she say s her thyroid labs were abnormal. Her TSH was normal but her t3 was low. She was told she needed to have repeated in 3-4 weeks. She says she stays fatigued. She had a sleep study done but she doe snot have the results as of yet. Reviewing labs , it states needs to repeat labs in 4 weeks.    Review of Systems  Constitutional: Positive for malaise/fatigue. Negative for diaphoresis and weight loss.  Eyes: Negative for blurred vision, double vision and pain.  Respiratory: Negative for shortness of breath.   Cardiovascular: Negative for chest pain, palpitations, orthopnea and leg swelling.  Gastrointestinal: Negative for abdominal pain.  Skin:  Negative for rash.  Neurological: Negative for dizziness, sensory change, loss of consciousness, weakness and headaches.  Endo/Heme/Allergies: Negative for polydipsia. Does not bruise/bleed easily.  Psychiatric/Behavioral: Negative for memory loss. The patient does not have insomnia.   All other systems reviewed and are negative.    Observations/Objective: Alert and oriented- answers all questions appropriately No distress    Assessment and Plan: VADA SWIFT in today with chief complaint of Thyroid Problem   1. T3 low in serum nned to repeat labs in 3 weeks  * call and get resulyt sof sleep study- they are not in chart for review.      Follow Up Instructions: prn    I discussed the assessment and treatment plan with the patient. The patient was provided an opportunity to ask questions and all were answered. The patient agreed with the plan and demonstrated an understanding of the instructions.   The patient was advised to call back or seek an in-person evaluation if the symptoms worsen or if the condition fails to improve as anticipated.  The above assessment and management plan was discussed with the patient. The patient verbalized understanding of and has agreed to the management plan. Patient is aware to call the clinic if symptoms persist or worsen. Patient is aware when to return to the clinic for a follow-up visit. Patient educated on when it is appropriate to go to the emergency department.   Time call ended:  5:16 I provided 11 minutes of non-face-to-face time during this encounter.    Mary-Margaret Daphine Deutscher, FNP

## 2020-04-09 NOTE — Progress Notes (Deleted)
NEUROLOGY CONSULTATION NOTE  [name] MRN: [id number] DOB: May 22, 1975  Referring provider: Chevis Pretty, FNP Primary care provider: Chevis Pretty, FNP  Reason for consult:  headaches   Subjective:  [name] is a 45 year old ***-handed female with COPD, HTN, Bipolar 1 disorder, IBS, chronic pain and anxiety who presents for headaches.  History supplemented by referring provider's note.  Onset:  *** Location:  *** Quality:  *** Intensity:  ***.  *** denies new headache, thunderclap headache or severe headache that wakes *** from sleep. Aura:  *** Prodrome:  *** Postdrome:  *** Associated symptoms:  ***.  *** denies associated unilateral numbness or weakness. 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  Prior MRI of brain without contrast on 03/07/2016 personally reviewed showed no acute abnormalities.  Current NSAIDS/analgesics:  Fioricet, Norco Current triptans:  none Current ergotamine:  none Current anti-emetic:  Promethazine 25mg  Current muscle relaxants:  none Current Antihypertensive medications:  lisinopril Current Antidepressant medications:  Trintellix Current Anticonvulsant medications:  Depakote ER 1000mg  Current anti-CGRP:  none Current Vitamins/Herbal/Supplements:  Ca Current Antihistamines/Decongestants:  Zyrtec Other therapy:  *** Hormone/birth control:  none Other medications:  Prolixin, Seroquel, alprazolam  Past NSAIDS/analgesics:  naproxen Past abortive triptans:  *** Past abortive ergotamine:  *** Past muscle relaxants:  Baclofen, Flexeril, Robaxin Past anti-emetic:  Zofran 4mg  Past antihypertensive medications:  *** Past antidepressant medications:  Citalopram, mirtazapine, Paxil,  trazodone Past anticonvulsant medications:  *** Past anti-CGRP:  none Past vitamins/Herbal/Supplements:  none Past antihistamines/decongestants:  Benadryl Other past therapies:  ***  Caffeine:  *** Alcohol:  *** Smoker:  *** Diet:  *** Exercise:  *** Depression:  yes; Anxiety:  Yes.  Bipolar 1 disorder.   Other pain:  *** Sleep hygiene:  *** Family history of headache:  ***      PAST MEDICAL HISTORY: Past Medical History:  Diagnosis Date  . Anxiety   . Back pain   . Bipolar 1 disorder ([place])   . COPD (chronic obstructive pulmonary disease) ([place])   . Hypertension   . IBS (irritable bowel syndrome)     PAST SURGICAL HISTORY: Past Surgical History:  Procedure Laterality Date  . ABDOMINAL HYSTERECTOMY    . BLADDER SURGERY  2015  . CHOLECYSTECTOMY      MEDICATIONS: Current Outpatient Medications on File Prior to Visit  Medication Sig Dispense Refill  . ALPRAZolam (XANAX) 1 MG tablet Take 1 mg by mouth 4 (four) times daily as needed.    . baclofen (LIORESAL) 10 MG tablet TK 1 T PO TID PRN    . butalbital-acetaminophen-caffeine (FIORICET) 50-325-40 MG tablet Take 1 tablet by mouth every 6 (six) hours as needed for headache or migraine. 10 tablet 0  . Calcium Polycarbophil (FIBER-CAPS PO) Take 1 capsule by mouth daily.     . cetirizine (ZYRTEC ALLERGY) 10 MG tablet Take 1 tablet (10 mg total) by mouth daily. 30 tablet 0  . divalproex (DEPAKOTE ER) 500 MG 24 hr tablet Take by mouth daily. Patient reports that she take 2 (500 mg) at bedtime.    . fluPHENAZine (PROLIXIN) 1 MG tablet Take 1 mg by mouth at bedtime.    Marland Kitchen HYDROcodone-acetaminophen (NORCO) 10-325 MG tablet Take 1 tablet by mouth every 6 (six) hours as needed for moderate pain (  For Neck and Back Pain).    Marland Kitchen linaclotide (LINZESS) 145 MCG CAPS capsule Take 1 capsule (145 mcg total) by mouth daily before breakfast. 30 capsule 0  . lisinopril (ZESTRIL) 10 MG tablet Take 1 tablet (10 mg total) by mouth daily. (Patient  taking differently: Take 10 mg by mouth daily as needed. For high blood pressure) 90 tablet 1  . promethazine (PHENERGAN) 25 MG tablet Take 1 tablet (25 mg total) by mouth every 6 (six) hours as needed for nausea or vomiting. 30 tablet 0  . QUEtiapine (SEROQUEL) 400 MG tablet Take 800 mg by mouth at bedtime.    . tamsulosin (FLOMAX) 0.4 MG CAPS capsule TAKE 1 CAPSULE BY MOUTH EVERY DAY 30 capsule 0  . vortioxetine HBr (TRINTELLIX) 20 MG TABS tablet Take 1 tablet (20 mg total) by mouth daily. 90 tablet 1   No current facility-administered medications on file prior to visit.    ALLERGIES: Allergies  Allergen Reactions  . Amoxicillin Nausea And Vomiting  . Chantix [Varenicline] Other (See Comments)    Causes bad nightmares, hallucinations  . Codeine Other (See Comments)    Shaking   . Divalproex Sodium Other (See Comments)    sedation  . Lithium Other (See Comments)    Drowsiness, tremors drowsiness  . Propoxyphene Nausea And Vomiting  . Other     Pt states "I cannot take steroids"  . Augmentin [Amoxicillin-Pot Clavulanate] Diarrhea and Other (See Comments)  . Prednisone     Moodiness  . Zolpidem Other (See Comments)    Patient states it does not agree with her body.     FAMILY HISTORY: Family History  Problem Relation Age of Onset  . Renal Disease Mother   . Heart attack Father     SOCIAL HISTORY: Social History   Socioeconomic History  . Marital status: Married    Spouse name: Not on file  . Number of children: 3  . Years of education: 20  . Highest education level: GED or equivalent  Occupational History  . Not on file  Tobacco Use  . Smoking status: Current Every Day Smoker    Packs/day: 1.00    Types: Cigarettes  . Smokeless tobacco: Never Used  Vaping Use  . Vaping Use: Never used  Substance and Sexual Activity  . Alcohol use: No  . Drug use: No  . Sexual activity: Not Currently    Birth control/protection: Surgical  Other Topics Concern  . Not on  file  Social History Narrative  . Not on file   Social Determinants of Health   Financial Resource Strain: Not on file  Food Insecurity: Not on file  Transportation Needs: Not on file  Physical Activity: Not on file  Stress: Not on file  Social Connections: Not on file  Intimate Partner Violence: Not on file    Objective:  *** General: No acute distress.  Patient appears well-groomed.   Head:  Normocephalic/atraumatic Eyes:  fundi examined but not visualized Neck: supple, no paraspinal tenderness, full range of motion Back: No paraspinal tenderness Heart: regular rate and rhythm Lungs: Clear to auscultation bilaterally. Vascular: No carotid bruits. Neurological Exam: Mental status: alert and oriented to person, place, and time, recent and remote memory intact, fund of knowledge intact, attention and concentration intact, speech fluent and not dysarthric, language intact. Cranial nerves: CN I: not tested CN II: pupils equal, round and reactive to light, visual fields intact CN III, IV, VI:  full range of motion, no nystagmus, no  ptosis CN V: facial sensation intact. CN VII: upper and lower face symmetric CN VIII: hearing intact CN IX, X: gag intact, uvula midline CN XI: sternocleidomastoid and trapezius muscles intact CN XII: tongue midline Bulk & Tone: normal, no fasciculations. Motor:  muscle strength 5/5 throughout Sensation:  Pinprick, temperature and vibratory sensation intact. Deep Tendon Reflexes:  2+ throughout,  toes downgoing.   Finger to nose testing:  Without dysmetria.   Heel to shin:  Without dysmetria.   Gait:  Normal station and stride.  Romberg negative.  Assessment/Plan:   ***    Thank you for allowing me to take part in the care of this patient.  Metta Clines, DO  CC: [name], FNP

## 2020-04-12 ENCOUNTER — Ambulatory Visit: Payer: Medicare Other | Admitting: Neurology

## 2020-04-12 ENCOUNTER — Other Ambulatory Visit: Payer: Self-pay | Admitting: Nurse Practitioner

## 2020-04-12 DIAGNOSIS — R339 Retention of urine, unspecified: Secondary | ICD-10-CM

## 2020-04-16 ENCOUNTER — Ambulatory Visit: Payer: Medicare Other | Admitting: Neurology

## 2020-04-25 ENCOUNTER — Encounter (HOSPITAL_COMMUNITY): Payer: Self-pay

## 2020-04-25 ENCOUNTER — Other Ambulatory Visit: Payer: Self-pay

## 2020-04-25 ENCOUNTER — Ambulatory Visit (HOSPITAL_COMMUNITY)
Admission: EM | Admit: 2020-04-25 | Discharge: 2020-04-25 | Disposition: A | Discharge status: Home / Self Care | Payer: Medicare Other

## 2020-04-25 DIAGNOSIS — Z711 Person with feared health complaint in whom no diagnosis is made: Secondary | ICD-10-CM | POA: Diagnosis not present

## 2020-04-25 HISTORY — DX: Other specified bacterial intestinal infections: A04.8

## 2020-04-25 NOTE — ED Triage Notes (Signed)
Pt presents with recurrent right knee pain, generalized body pain, knots under skin on right arm, and recurring fatigue.

## 2020-04-25 NOTE — ED Provider Notes (Addendum)
[place]  ____________________________________________  Time seen: Approximately 3:14 PM  I have reviewed the triage vital signs and the nursing notes.   HISTORY  Chief Complaint Fatigue, Knots under Skin, Generalized Body Aches, and Knee Pain   Historian Patient     HPI [name] is a 45 y.o. female with a history hypertension, anxiety, IBS and COPD, presents to the urgent care with multiple medical complaints.  Patient states that when she goes from a sitting to a standing position, she feels like she needs to hold onto her and that her muscles feel like they are under too much weight.  Patient states that she has gained a significant amount of weight and is quite worried about it.  She also feels like she has knots under the skin along her upper arms although this provider does not appreciate any soft tissue masses.  Patient states that she contacted her primary care provider today who told her to seek care at a local urgent care.  Patient is tearful during history.  She denies homicidal or suicidal ideation.   Past Medical History:  Diagnosis Date  . Anxiety   . Back pain   . Bipolar 1 disorder ([place])   . COPD (chronic obstructive pulmonary disease) ([place])   . H. pylori infection   . Hypertension   . IBS (irritable bowel syndrome)      Immunizations up to date:  Yes.     Past Medical History:  Diagnosis Date  . Anxiety   . Back pain   . Bipolar 1 disorder ([place])   . COPD (chronic obstructive pulmonary disease) ([place])   . H. pylori infection   . Hypertension   . IBS (irritable bowel syndrome)     Patient Active Problem List   Diagnosis Date Noted  . Chronic obstructive pulmonary disease ([place]) 11/28/2019  . Primary insomnia 11/28/2019  . Acute respiratory failure with hypoxia ([place]) 11/28/2019  . Major depressive disorder, recurrent episode ([place]) 11/13/2018  . GAD (generalized anxiety disorder) 08/23/2018  . Hypotension 08/19/2018  .  Anemia 08/19/2018  . Hypokalemia 08/19/2018  . Tobacco abuse 08/19/2018  . Polypharmacy 03/05/2016  . Chronic pain 03/05/2016  . Bipolar disorder ([place]) 03/05/2016    Past Surgical History:  Procedure Laterality Date  . ABDOMINAL HYSTERECTOMY    . BLADDER SURGERY  2015  . CHOLECYSTECTOMY      Prior to Admission medications   Medication Sig Start Date End Date Taking? Authorizing Provider  omeprazole (PRILOSEC) 40 MG capsule Take 40 mg by mouth daily.   Yes [provider]  ALPRAZolam Duanne Moron) 1 MG tablet Take 1 mg by mouth 4 (four) times daily as needed. 09/09/19   [provider]  baclofen (LIORESAL) 10 MG tablet TK 1 T PO TID PRN 11/28/18   [provider]  butalbital-acetaminophen-caffeine (FIORICET) 50-325-40 MG tablet Take 1 tablet by mouth every 6 (six) hours as needed for headache or migraine. 12/16/19 12/15/20  Hassell Done, Mary-Margaret, FNP  Calcium Polycarbophil (FIBER-CAPS PO) Take 1 capsule by mouth daily.     [provider]  cetirizine (ZYRTEC ALLERGY) 10 MG tablet Take 1 tablet (10 mg total) by mouth daily. 12/27/19   Jacqlyn Larsen, PA-C  divalproex (DEPAKOTE ER) 500 MG 24 hr tablet Take by mouth daily. Patient reports that she take 2 (500 mg) at bedtime.    [provider]  fluPHENAZine (PROLIXIN) 1 MG tablet Take 1 mg by mouth at bedtime. 12/15/19   [provider]  HYDROcodone-acetaminophen (NORCO) 10-325 MG tablet Take 1 tablet by mouth every 6 (six) hours as needed for moderate pain (For Neck and Back Pain).    [provider]  linaclotide Rolan Lipa) 145 MCG CAPS capsule Take 1 capsule (145 mcg total) by mouth daily before breakfast. 01/30/20   Hassell Done, Mary-Margaret, FNP  lisinopril (ZESTRIL) 10 MG tablet Take 1 tablet (10 mg total) by mouth daily. Patient taking differently: Take 10 mg by mouth daily as needed. For high blood pressure 10/10/18   Hassell Done, Mary-Margaret, FNP  promethazine (PHENERGAN) 25 MG tablet Take 1  tablet (25 mg total) by mouth every 6 (six) hours as needed for nausea or vomiting. 06/17/19   Janora Norlander, DO  QUEtiapine (SEROQUEL) 400 MG tablet Take 800 mg by mouth at bedtime. 12/25/19   [provider]  tamsulosin (FLOMAX) 0.4 MG CAPS capsule TAKE 1 CAPSULE BY MOUTH EVERY DAY 04/12/20   Hassell Done, Mary-Margaret, FNP  vortioxetine HBr (TRINTELLIX) 20 MG TABS tablet Take 1 tablet (20 mg total) by mouth daily. 11/16/18   Johnn Hai, MD    Allergies Amoxicillin, Chantix [varenicline], Codeine, Divalproex sodium, Lithium, Propoxyphene, Other, Augmentin [amoxicillin-pot clavulanate], Prednisone, and Zolpidem  Family History  Problem Relation Age of Onset  . Renal Disease Mother   . Heart attack Father     Social History Social History   Tobacco Use  . Smoking status: Current Every Day Smoker    Packs/day: 1.00    Types: Cigarettes  . Smokeless tobacco: Never Used  Vaping Use  . Vaping Use: Never used  Substance Use Topics  . Alcohol use: No  . Drug use: No     Review of Systems  Constitutional: No fever/chills Eyes:  No discharge ENT: No upper respiratory complaints. Respiratory: no cough. No SOB/ use of accessory muscles to breath Gastrointestinal:   No nausea, no vomiting.  No diarrhea.  No constipation. Musculoskeletal: Patient has diffuse discomfort in lower and upper extremities.  Skin: Negative for rash, abrasions, lacerations, ecchymosis.   ____________________________________________   PHYSICAL EXAM:  VITAL SIGNS: ED Triage Vitals  Enc Vitals Group     BP 04/25/20 1429 136/89     Pulse Rate 04/25/20 1429 (!) 103     Resp 04/25/20 1429 17     Temp 04/25/20 1429 98.8 F (37.1 C)     Temp Source 04/25/20 1429 Oral     SpO2 04/25/20 1429 94 %     Weight --      Height --      Head Circumference --      Peak Flow --      Pain Score 04/25/20 1425 6     Pain Loc --      Pain Edu? --      Excl. in [place]? --      Constitutional: Alert and  oriented. Well appearing and in no acute distress. Eyes: Conjunctivae are normal. PERRL. EOMI. Head: Atraumatic. ENT:      Nose: No congestion/rhinnorhea.      Mouth/Throat: Mucous membranes are moist.  Neck: No stridor.  No cervical spine tenderness to palpation. Cardiovascular: Normal rate, regular rhythm. Normal S1 and S2.  Good peripheral circulation. Respiratory: Normal respiratory effort without tachypnea or retractions. Lungs CTAB. Good air entry to the bases with no decreased or absent breath sounds Gastrointestinal: Bowel sounds x 4 quadrants. Soft and nontender to palpation. No guarding or rigidity. No distention. Musculoskeletal: Full range of motion to all extremities. No obvious deformities  noted Neurologic:  Normal for age. No gross focal neurologic deficits are appreciated.  Skin:  Skin is warm, dry and intact. No rash noted. Psychiatric: Mood and affect are normal for age. Speech and behavior are normal.   ____________________________________________   LABS (all labs ordered are listed, but only abnormal results are displayed)  Labs Reviewed - No data to display ____________________________________________  EKG   ____________________________________________  RADIOLOGY   No results found.  ____________________________________________    PROCEDURES  Procedure(s) performed:     Procedures     Medications - No data to display   ____________________________________________   INITIAL IMPRESSION / ASSESSMENT AND PLAN / ED COURSE  Pertinent labs & imaging results that were available during my care of the patient were reviewed by me and considered in my medical decision making (see chart for details).      Assessment and plan Feared complaint without diagnosis 45 year old female presents to the urgent care with generalized discomfort of the lower extremities, upper extremities and concern for soft tissue masses along the upper extremity.  Patient  was mildly tachycardic at triage and patient was very tearful during history.  She seemed to feel very guilty about recent weight gain.  Asked patient if she could ambulate across the room and patient was easily able to go from a sitting position to standing and ambulated easily despite complaints of leg instability and muscle weakness.  I offered to conduct some baseline screening labs for patient but patient felt like it would be more conducive for her to follow-up with her primary care provider for exhaustive panels that would include testing for autoimmune diseases and thyroid pathology.  Patient essentially would like to avoid undergoing lab work twice.  An EKG was conducted during this urgent care encounter which showed normal sinus rhythm without ST segment elevation or apparent arrhythmia.  Patient states that she has been taking anti-inflammatories at home and they help with her pain some.  I recommended that she continue taking medications as directed by her primary care provider.     ____________________________________________  FINAL CLINICAL IMPRESSION(S) / ED DIAGNOSES  Final diagnoses:  Feared complaint without diagnosis      NEW MEDICATIONS STARTED DURING THIS VISIT:  ED Discharge Orders    None          This chart was dictated using voice recognition software/Dragon. Despite best efforts to proofread, errors can occur which can change the meaning. Any change was purely unintentional.     Lannie Fields, PA-C 04/25/20 [place], [place] 04/25/20 1530

## 2020-04-25 NOTE — Discharge Instructions (Signed)
Please schedule follow-up appointment with your primary care provider for blood work.

## 2020-04-27 ENCOUNTER — Telehealth: Payer: Self-pay | Admitting: Nurse Practitioner

## 2020-04-27 NOTE — Telephone Encounter (Signed)
-----   Message from Chevis Pretty, [place] sent at 04/06/2020  5:16 PM EST ----- Needs Thyroid labs repeated

## 2020-04-27 NOTE — Telephone Encounter (Signed)
Patient aware and will come in and have labs repeated.

## 2020-04-30 ENCOUNTER — Ambulatory Visit (INDEPENDENT_AMBULATORY_CARE_PROVIDER_SITE_OTHER): Payer: Medicare Other | Admitting: Nurse Practitioner

## 2020-04-30 ENCOUNTER — Encounter: Payer: Self-pay | Admitting: Nurse Practitioner

## 2020-04-30 DIAGNOSIS — R300 Dysuria: Secondary | ICD-10-CM

## 2020-04-30 HISTORY — DX: Dysuria: R30.0

## 2020-04-30 LAB — URINALYSIS, MICROSCOPIC ONLY: RBC, Urine: NONE SEEN /hpf (ref 0–2)

## 2020-04-30 NOTE — Progress Notes (Signed)
   Virtual Visit via telephone Note Due to COVID-19 pandemic this visit was conducted virtually. This visit type was conducted due to national recommendations for restrictions regarding the COVID-19 Pandemic (e.g. social distancing, sheltering in place) in an effort to limit this patient's exposure and mitigate transmission in our community. All issues noted in this document were discussed and addressed.  A physical exam was not performed with this format.  I connected with [name] on 04/30/20 at  9:10 AM by telephone and verified that I am speaking with the correct person using two identifiers. [name] is currently located at home during visit. The provider, Ivy Lynn, NP is located in their office at time of visit.  I discussed the limitations, risks, security and privacy concerns of performing an evaluation and management service by telephone and the availability of in person appointments. I also discussed with the patient that there may be a patient responsible charge related to this service. The patient expressed understanding and agreed to proceed.   History and Present Illness:  Dysuria  This is a new problem. The current episode started yesterday. The problem has been unchanged. The patient is experiencing no pain. The fever has been present for 3 - 4 days. Associated symptoms include nausea. Pertinent negatives include no chills, discharge or flank pain. She has tried nothing for the symptoms.      Review of Systems  Constitutional: Negative for chills.  HENT: Negative.   Respiratory: Negative.   Gastrointestinal: Positive for nausea.  Genitourinary: Positive for dysuria. Negative for flank pain.  Skin: Negative.   Neurological: Negative for headaches.  All other systems reviewed and are negative.    Observations/Objective: Tele-visit  Assessment and Plan:  Dysuria New symptoms of dysuria, mild pelvic pain. urinalysis orders completed.  Results  pending.   will treat patient appropriately. Advised patient to increase hydration, follow-up with worsening unresolved symptoms.    Follow Up Instructions: Follow-up with worsening unresolved symptoms.    I discussed the assessment and treatment plan with the patient. The patient was provided an opportunity to ask questions and all were answered. The patient agreed with the plan and demonstrated an understanding of the instructions.   The patient was advised to call back or seek an in-person evaluation if the symptoms worsen or if the condition fails to improve as anticipated.  The above assessment and management plan was discussed with the patient. The patient verbalized understanding of and has agreed to the management plan. Patient is aware to call the clinic if symptoms persist or worsen. Patient is aware when to return to the clinic for a follow-up visit. Patient educated on when it is appropriate to go to the emergency department.   Time call ended: 9:11 AM  I provided 11 minutes of non-face-to-face time during this encounter.    Ivy Lynn, NP

## 2020-04-30 NOTE — Assessment & Plan Note (Signed)
New symptoms of dysuria, mild pelvic pain. urinalysis orders completed.  Results pending.   will treat patient appropriately. Advised patient to increase hydration, follow-up with worsening unresolved symptoms.

## 2020-05-03 ENCOUNTER — Ambulatory Visit: Payer: Medicare Other | Admitting: Family

## 2020-05-03 ENCOUNTER — Telehealth: Payer: Self-pay

## 2020-05-03 NOTE — Telephone Encounter (Signed)
Pt is on blood thinner Rx currently from where blood clots were found in her lungs. Wants to know if she needs to continue taking the medicine like she is until her appt on 05/25/20. (appt was supposed to be for today but pt had to r/s due to not having heat in her home and having to wait on the heat guy to get to her home to fix it.) 

## 2020-05-03 NOTE — Telephone Encounter (Signed)
Who diagnosed her with blood clots and what medicine is she on?

## 2020-05-03 NOTE — Telephone Encounter (Signed)
DIREGARD THIS TELEPHONE CALL. There was a mix up with this chart and another patients chart.

## 2020-05-04 ENCOUNTER — Other Ambulatory Visit: Payer: Self-pay | Admitting: Nurse Practitioner

## 2020-05-04 DIAGNOSIS — R339 Retention of urine, unspecified: Secondary | ICD-10-CM

## 2020-05-05 ENCOUNTER — Ambulatory Visit: Payer: Medicare Other | Admitting: Nurse Practitioner

## 2020-05-06 ENCOUNTER — Telehealth: Payer: Self-pay

## 2020-05-07 ENCOUNTER — Encounter: Payer: Self-pay | Admitting: Nurse Practitioner

## 2020-05-07 ENCOUNTER — Ambulatory Visit (INDEPENDENT_AMBULATORY_CARE_PROVIDER_SITE_OTHER): Payer: Medicare Other | Admitting: Nurse Practitioner

## 2020-05-07 DIAGNOSIS — R29898 Other symptoms and signs involving the musculoskeletal system: Secondary | ICD-10-CM

## 2020-05-07 DIAGNOSIS — B3731 Acute candidiasis of vulva and vagina: Secondary | ICD-10-CM

## 2020-05-07 DIAGNOSIS — B373 Candidiasis of vulva and vagina: Secondary | ICD-10-CM | POA: Diagnosis not present

## 2020-05-07 DIAGNOSIS — R300 Dysuria: Secondary | ICD-10-CM

## 2020-05-07 MED ORDER — FLUCONAZOLE 150 MG PO TABS: 150.0000 mg | ORAL_TABLET | Freq: Once | ORAL | 0 refills | Status: AC

## 2020-05-07 NOTE — Progress Notes (Signed)
Virtual Visit via telephone Note Due to COVID-19 pandemic this visit was conducted virtually. This visit type was conducted due to national recommendations for restrictions regarding the COVID-19 Pandemic (e.g. social distancing, sheltering in place) in an effort to limit this patient's exposure and mitigate transmission in our community. All issues noted in this document were discussed and addressed.  A physical exam was not performed with this format.  I connected with [name] on 05/07/20 at 8:30 by telephone and verified that I am speaking with the correct person using two identifiers. [name] is currently located at home and no one is currently with her during visit. The provider, [name], FNP is located in their office at time of visit.  I discussed the limitations, risks, security and privacy concerns of performing an evaluation and management service by telephone and the availability of in person appointments. I also discussed with the patient that there may be a patient responsible charge related to this service. The patient expressed understanding and agreed to proceed.   History and Present Illness:   Chief Complaint: dysuria- hospital follow up  HPI Patient went to urgent care on 04/30/20 with dysuria. The urine was clear but was given levaquin and urine sent for culture. Culture is not back yet. She is better on levaquin. She also went to ED at [facility] on 04/25/20 with c/o lower ext pain and weakness. She was not checked for covid. She is still hurting in her legs. They suggested she get tested for lupus.  * she is on PCN , clindamycin and prevacid for positive Hpylori- she did not tell the urgent care she was taking this when she was seen for dysuira. Now she thinks she has a yeast infection with itchy discharge.  Review of Systems  Constitutional: Negative for diaphoresis and weight loss.  Eyes: Negative for blurred vision, double  vision and pain.  Respiratory: Negative for shortness of breath.   Cardiovascular: Negative for chest pain, palpitations, orthopnea and leg swelling.  Gastrointestinal: Negative for abdominal pain.  Musculoskeletal: Positive for joint pain and myalgias.  Skin: Negative for rash.  Neurological: Negative for dizziness, sensory change, loss of consciousness, weakness and headaches.  Endo/Heme/Allergies: Negative for polydipsia. Does not bruise/bleed easily.  Psychiatric/Behavioral: Negative for memory loss. The patient does not have insomnia.   All other systems reviewed and are negative.    Observations/Objective: Alert and oriented- answers all questions appropriately No distress    Assessment and Plan: [name] comes in today with chief complaint of No chief complaint on file.   Diagnosis and orders addressed:  1. Weakness of both lower extremities Labs ordered Patient will come in later to day and have them drawn - CBC with Differential/Platelet; Future - ANA, IFA Comprehensive Panel; Future - Rheumatoid factor; Future - Sedimentation rate; Future - C-reactive protein; Future - CMP14+EGFR; Future  2. Vaginal candidiasis - fluconazole (DIFLUCAN) 150 MG tablet; Take 1 tablet (150 mg total) by mouth once for 1 dose.  Dispense: 1 tablet; Refill: 0  3. Dysuria Continue with antibiotics as prescribed uregent care should cll her with culture results Force fluids   Labs pending Health Maintenance reviewed Diet and exercise encouraged  Follow up plan: Prn    I discussed the assessment and treatment plan with the patient. The patient was provided an opportunity to ask questions and all were answered. The patient agreed with the plan and demonstrated an understanding of the instructions.  The patient was advised to call back or seek an in-person evaluation if the symptoms worsen or if the condition fails to improve as anticipated.  The above assessment and  management plan was discussed with the patient. The patient verbalized understanding of and has agreed to the management plan. Patient is aware to call the clinic if symptoms persist or worsen. Patient is aware when to return to the clinic for a follow-up visit. Patient educated on when it is appropriate to go to the emergency department.   Time call ended:  8:50 I provided 20 minutes of non-face-to-face time during this encounter.    [name], FNP

## 2020-05-10 ENCOUNTER — Other Ambulatory Visit: Payer: Medicare Other

## 2020-05-10 ENCOUNTER — Other Ambulatory Visit: Payer: Self-pay

## 2020-05-10 DIAGNOSIS — R29898 Other symptoms and signs involving the musculoskeletal system: Secondary | ICD-10-CM

## 2020-05-11 LAB — CBC WITH DIFFERENTIAL/PLATELET
Basophils Absolute: 0 10*3/uL (ref 0.0–0.2)
Basos: 0 %
EOS (ABSOLUTE): 0.1 10*3/uL (ref 0.0–0.4)
Eos: 2 %
Hematocrit: 41.9 % (ref 34.0–46.6)
Hemoglobin: 14.3 g/dL (ref 11.1–15.9)
Immature Grans (Abs): 0 10*3/uL (ref 0.0–0.1)
Immature Granulocytes: 1 %
Lymphocytes Absolute: 2.4 10*3/uL (ref 0.7–3.1)
Lymphs: 32 %
MCH: 30.8 pg (ref 26.6–33.0)
MCHC: 34.1 g/dL (ref 31.5–35.7)
MCV: 90 fL (ref 79–97)
Monocytes Absolute: 0.6 10*3/uL (ref 0.1–0.9)
Monocytes: 8 %
Neutrophils Absolute: 4.3 10*3/uL (ref 1.4–7.0)
Neutrophils: 57 %
Platelets: 226 10*3/uL (ref 150–450)
RBC: 4.65 x10E6/uL (ref 3.77–5.28)
RDW: 13.4 % (ref 11.7–15.4)
WBC: 7.5 10*3/uL (ref 3.4–10.8)

## 2020-05-11 LAB — CMP14+EGFR
ALT: 11 IU/L (ref 0–32)
AST: 17 IU/L (ref 0–40)
Albumin/Globulin Ratio: 1.8 (ref 1.2–2.2)
Albumin: 4.7 g/dL (ref 3.8–4.8)
Alkaline Phosphatase: 151 IU/L — ABNORMAL HIGH (ref 44–121)
BUN/Creatinine Ratio: 13 (ref 9–23)
BUN: 9 mg/dL (ref 6–24)
Bilirubin Total: <0.2 mg/dL (ref 0.0–1.2)
CO2: 26 mmol/L (ref 20–29)
Calcium: 9.7 mg/dL (ref 8.7–10.2)
Chloride: 102 mmol/L (ref 96–106)
Creatinine, Ser: 0.69 mg/dL (ref 0.57–1.00)
GFR calc Af Amer: 122 mL/min/{1.73_m2} (ref >59)
GFR calc non Af Amer: 106 mL/min/{1.73_m2} (ref >59)
Globulin, Total: 2.6 g/dL (ref 1.5–4.5)
Glucose: 110 mg/dL — ABNORMAL HIGH (ref 65–99)
Potassium: 4.4 mmol/L (ref 3.5–5.2)
Sodium: 144 mmol/L (ref 134–144)
Total Protein: 7.3 g/dL (ref 6.0–8.5)

## 2020-05-11 LAB — ANA COMPREHENSIVE PANEL
Anti JO-1: <0.2 AI (ref 0.0–0.9)
Centromere Ab Screen: <0.2 AI (ref 0.0–0.9)
Chromatin Ab SerPl-aCnc: <0.2 AI (ref 0.0–0.9)
ENA RNP Ab: <0.2 AI (ref 0.0–0.9)
ENA SM Ab Ser-aCnc: <0.2 AI (ref 0.0–0.9)
ENA SSA (RO) Ab: <0.2 AI (ref 0.0–0.9)
ENA SSB (LA) Ab: <0.2 AI (ref 0.0–0.9)
Scleroderma (Scl-70) (ENA) Antibody, IgG: <0.2 AI (ref 0.0–0.9)
dsDNA Ab: <1 IU/mL (ref 0–9)

## 2020-05-11 LAB — C-REACTIVE PROTEIN: CRP: 8 mg/L (ref 0–10)

## 2020-05-11 LAB — SEDIMENTATION RATE: Sed Rate: 17 mm/hr (ref 0–32)

## 2020-05-11 LAB — RHEUMATOID FACTOR: Rheumatoid fact SerPl-aCnc: <10 IU/mL (ref <14.0)

## 2020-05-11 NOTE — Addendum Note (Signed)
Addended by: Chevis Pretty on: 05/11/2020 07:59 AM   Modules accepted: Orders

## 2020-05-12 LAB — THYROID PANEL WITH TSH
Free Thyroxine Index: 0.8 — ABNORMAL LOW (ref 1.2–4.9)
T3 Uptake Ratio: 22 % — ABNORMAL LOW (ref 24–39)
T4, Total: 3.7 ug/dL — ABNORMAL LOW (ref 4.5–12.0)
TSH: 1.23 u[IU]/mL (ref 0.450–4.500)

## 2020-05-12 LAB — SPECIMEN STATUS REPORT

## 2020-05-18 ENCOUNTER — Ambulatory Visit (INDEPENDENT_AMBULATORY_CARE_PROVIDER_SITE_OTHER): Payer: Medicare Other | Admitting: Family Medicine

## 2020-05-18 ENCOUNTER — Encounter: Payer: Self-pay | Admitting: Family Medicine

## 2020-05-18 DIAGNOSIS — J020 Streptococcal pharyngitis: Secondary | ICD-10-CM

## 2020-05-18 MED ORDER — AZITHROMYCIN 250 MG PO TABS: ORAL_TABLET | ORAL | 0 refills | Status: DC

## 2020-05-18 MED ORDER — FLUCONAZOLE 150 MG PO TABS: 150.0000 mg | ORAL_TABLET | Freq: Once | ORAL | 0 refills | Status: AC

## 2020-05-18 NOTE — Progress Notes (Signed)
Subjective:    Patient ID: [name], female    DOB: [birth year], 45 y.o.   MRN: [id number]   HPI: [name] is a 45 y.o. female presenting for sore throat starting this AM. Pus pockets on both sides of throat. Having chills and subj. Fever. No cough or dyspnea.    Depression screen [facility] 2/9 01/30/2020 12/10/2019 11/28/2019 04/02/2019 12/24/2018  Decreased Interest 0 0 1 0 2  Down, Depressed, Hopeless 0 0 1 1 2   PHQ - 2 Score 0 0 2 1 4   Altered sleeping - - 0 - 0  Tired, decreased energy - - 2 - 1  Change in appetite - - 0 - 1  Feeling bad or failure about yourself  - - 1 - 0  Trouble concentrating - - 2 - 1  Moving slowly or fidgety/restless - - 1 - 1  Suicidal thoughts - - 0 - 0  PHQ-9 Score - - 8 - 8  Difficult doing work/chores - - Somewhat difficult - -  Some encounter information is confidential and restricted. Go to Review Flowsheets activity to see all data.     Relevant past medical, surgical, family and social history reviewed and updated as indicated.  Interim medical history since our last visit reviewed. Allergies and medications reviewed and updated.  ROS:  Review of Systems  Constitutional: Negative for appetite change, chills, diaphoresis, fatigue and fever.  HENT: Positive for ear pain (left ear today, briefly.), sore throat and trouble swallowing. Negative for congestion, hearing loss, postnasal drip and rhinorrhea.   Respiratory: Negative for cough, chest tightness and shortness of breath.   Cardiovascular: Negative for chest pain and palpitations.  Gastrointestinal: Negative for abdominal pain, nausea and vomiting.  Skin: Negative for rash.     Social History   Tobacco Use  Smoking Status Current Every Day Smoker  . Packs/day: 1.00  . Types: Cigarettes  Smokeless Tobacco Never Used       Objective:     Wt Readings from Last 3 Encounters:  01/30/20 134 lb (60.8 kg)  12/30/19 127 lb 2 oz (57.7 kg)  12/16/19 132 lb 4.4 oz (60 kg)      Exam deferred. Pt. Harboring due to COVID 19. Phone visit performed.   Assessment & Plan:   1. Strep pharyngitis     Meds ordered this encounter  Medications  . azithromycin (ZITHROMAX Z-PAK) 250 MG tablet    Sig: Take two right away Then one a day for the next 4 days.    Dispense:  6 each    Refill:  0  . fluconazole (DIFLUCAN) 150 MG tablet    Sig: Take 1 tablet (150 mg total) by mouth once for 1 dose. At onset of symptoms. Repeat at end of treatment    Dispense:  2 tablet    Refill:  0    No orders of the defined types were placed in this encounter.     Diagnoses and all orders for this visit:  Strep pharyngitis  Other orders -     azithromycin (ZITHROMAX Z-PAK) 250 MG tablet; Take two right away Then one a day for the next 4 days. -     fluconazole (DIFLUCAN) 150 MG tablet; Take 1 tablet (150 mg total) by mouth once for 1 dose. At onset of symptoms. Repeat at end of treatment    Virtual Visit via telephone Note  I discussed the limitations, risks, security and privacy concerns of performing an evaluation  and management service by telephone and the availability of in person appointments. The patient was identified with two identifiers. Pt.expressed understanding and agreed to proceed. Pt. Is at home. Dr. Livia Snellen is in his office.  Follow Up Instructions:   I discussed the assessment and treatment plan with the patient. The patient was provided an opportunity to ask questions and all were answered. The patient agreed with the plan and demonstrated an understanding of the instructions.   The patient was advised to call back or seek an in-person evaluation if the symptoms worsen or if the condition fails to improve as anticipated.   Total minutes including chart review and phone contact time: 11   Follow up plan: Return if symptoms worsen or fail to improve.  Claretta Fraise, MD [place]

## 2020-05-25 ENCOUNTER — Ambulatory Visit: Payer: Medicare Other | Admitting: Family

## 2020-05-26 ENCOUNTER — Other Ambulatory Visit: Payer: Self-pay | Admitting: Nurse Practitioner

## 2020-05-26 ENCOUNTER — Telehealth: Payer: Self-pay | Admitting: Nurse Practitioner

## 2020-05-26 DIAGNOSIS — G4734 Idiopathic sleep related nonobstructive alveolar hypoventilation: Secondary | ICD-10-CM

## 2020-05-26 NOTE — Telephone Encounter (Signed)
Oxygen ordered for nighttime only Discuss hazards of smoking and oxygen

## 2020-05-26 NOTE — Telephone Encounter (Signed)
Notified patient that you wanted her to see endocrinology but she wants to know if she can be started on thyroid med until she can get an appt. Also wants you to know that she already has O2 at night time but she is still falling asleep during the day.

## 2020-05-26 NOTE — Telephone Encounter (Signed)
Pt would like to discuss her most recent lab work and sleep study

## 2020-05-26 NOTE — Telephone Encounter (Signed)
Will you please look under media and review the pulmonary test for her overnight pulse oximetry. It looks like she does qualify for nocturnal oxygen but I can't see where any orders have been written for this. Does pt ntbs to review and get orders?

## 2020-05-26 NOTE — Progress Notes (Signed)
DME 

## 2020-05-27 NOTE — Telephone Encounter (Signed)
Needs to see endo for her thyroid, cannot start on something early Needs a full sleep study- has she had one

## 2020-05-29 ENCOUNTER — Other Ambulatory Visit: Payer: Self-pay | Admitting: Nurse Practitioner

## 2020-05-29 DIAGNOSIS — R339 Retention of urine, unspecified: Secondary | ICD-10-CM

## 2020-05-31 ENCOUNTER — Encounter: Payer: Self-pay | Admitting: *Deleted

## 2020-06-01 ENCOUNTER — Telehealth: Payer: Self-pay

## 2020-06-01 NOTE — Telephone Encounter (Signed)
Discussed with patient and she verbalized understanding. Would like to go ahead with referral to endocrinology and also wants to go ahead with full sleep study. Can you please refer for both?

## 2020-06-01 NOTE — Telephone Encounter (Signed)
See other telephone note.  
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
## 2020-06-08 ENCOUNTER — Other Ambulatory Visit: Payer: Self-pay | Admitting: Nurse Practitioner

## 2020-06-08 ENCOUNTER — Ambulatory Visit (INDEPENDENT_AMBULATORY_CARE_PROVIDER_SITE_OTHER): Payer: Medicare Other | Admitting: Neurology

## 2020-06-08 ENCOUNTER — Other Ambulatory Visit: Payer: Self-pay | Admitting: Neurology

## 2020-06-08 ENCOUNTER — Encounter: Payer: Self-pay | Admitting: Neurology

## 2020-06-08 ENCOUNTER — Other Ambulatory Visit: Payer: Self-pay

## 2020-06-08 VITALS — BP 133/88 | HR 100 | Resp 18 | Ht 61.0 in | Wt 144.0 lb

## 2020-06-08 DIAGNOSIS — F315 Bipolar disorder, current episode depressed, severe, with psychotic features: Secondary | ICD-10-CM | POA: Diagnosis not present

## 2020-06-08 DIAGNOSIS — R7989 Other specified abnormal findings of blood chemistry: Secondary | ICD-10-CM

## 2020-06-08 DIAGNOSIS — R0683 Snoring: Secondary | ICD-10-CM

## 2020-06-08 DIAGNOSIS — G43009 Migraine without aura, not intractable, without status migrainosus: Secondary | ICD-10-CM | POA: Diagnosis not present

## 2020-06-08 DIAGNOSIS — R4189 Other symptoms and signs involving cognitive functions and awareness: Secondary | ICD-10-CM | POA: Diagnosis not present

## 2020-06-08 MED ORDER — EMGALITY 120 MG/ML ~~LOC~~ SOAJ: 240.0000 mg | Freq: Once | SUBCUTANEOUS | 0 refills | Status: AC

## 2020-06-08 MED ORDER — AJOVY 225 MG/1.5ML ~~LOC~~ SOAJ: 225.0000 mg | SUBCUTANEOUS | 5 refills | Status: DC

## 2020-06-08 MED ORDER — EMGALITY 120 MG/ML ~~LOC~~ SOAJ: 120.0000 mg | SUBCUTANEOUS | 5 refills | Status: DC

## 2020-06-08 NOTE — Patient Instructions (Signed)
1.  For headache prevention, start Ajovy injection every 28 days 2.  Limit use of pain relievers to no more than 2 days out of week to prevent risk of rebound or medication-overuse headache. 3.  Keep headache diary 4.  Stop caffeine use - such as Mt Dew 5.  Neurocognitive testing 6.  Follow up 6 months.

## 2020-06-08 NOTE — Progress Notes (Signed)
Ef endo

## 2020-06-08 NOTE — Telephone Encounter (Signed)
Pt advised.

## 2020-06-08 NOTE — Telephone Encounter (Signed)
Changed to [employer].  Please contact patient to let  her know that the first dose requires 2 injections (then just one injection every 28 days thereafter)

## 2020-06-08 NOTE — Progress Notes (Signed)
Charlesetta Garibaldi Key: [id number] - PA Case ID: [id number] - Rx #: [id number] Need help? Call us at [phone] Outcome Approvedtoday Request Reference Number: [id number]. EMGALITY INJ 120MG /ML is approved through 04/02/2021. Your patient may now fill this prescription and it will be covered. Drug Emgality 120MG /ML auto-injectors (migraine) Form [employer] Medicare Part D Electronic Prior Authorization Form (2017 NCPDP) [place] [phone]

## 2020-06-09 ENCOUNTER — Telehealth: Payer: Self-pay

## 2020-06-09 NOTE — Telephone Encounter (Signed)
Message left by pt, Pt wanted to know how to take the Emgality.   Returned pt call please take both injections at once. One in one leg and the other in the other leg. After this month it will be one injection a month.

## 2020-06-21 ENCOUNTER — Other Ambulatory Visit: Payer: Self-pay | Admitting: Nurse Practitioner

## 2020-06-21 DIAGNOSIS — R339 Retention of urine, unspecified: Secondary | ICD-10-CM

## 2020-06-28 ENCOUNTER — Encounter: Payer: Self-pay | Admitting: Nurse Practitioner

## 2020-06-28 ENCOUNTER — Ambulatory Visit (INDEPENDENT_AMBULATORY_CARE_PROVIDER_SITE_OTHER): Payer: Medicare Other | Admitting: Nurse Practitioner

## 2020-06-28 DIAGNOSIS — B373 Candidiasis of vulva and vagina: Secondary | ICD-10-CM

## 2020-06-28 DIAGNOSIS — B3731 Acute candidiasis of vulva and vagina: Secondary | ICD-10-CM

## 2020-06-28 MED ORDER — FLUCONAZOLE 150 MG PO TABS: ORAL_TABLET | ORAL | 0 refills | Status: DC

## 2020-06-28 NOTE — Progress Notes (Signed)
   Virtual Visit  Note Due to COVID-19 pandemic this visit was conducted virtually. This visit type was conducted due to national recommendations for restrictions regarding the COVID-19 Pandemic (e.g. social distancing, sheltering in place) in an effort to limit this patient's exposure and mitigate transmission in our community. All issues noted in this document were discussed and addressed.  A physical exam was not performed with this format.  I connected with [name] on 06/28/20 at 11:20 by telephone and verified that I am speaking with the correct person using two identifiers. [name] is currently located at home and her grandkids is currently with her during visit. The provider, [name], FNP is located in their office at time of visit.  I discussed the limitations, risks, security and privacy concerns of performing an evaluation and management service by telephone and the availability of in person appointments. I also discussed with the patient that there may be a patient responsible charge related to this service. The patient expressed understanding and agreed to proceed.   History and Present Illness:  Chief Complaint: Vaginitis   HPI Patient calls in c/o of yeast infection that started several days ago. She has been using OTC cream which is not helping. LMP - no longer has. Gets yeast infections very frequently.   Review of Systems  Constitutional: Negative for diaphoresis and weight loss.  Eyes: Negative for blurred vision, double vision and pain.  Respiratory: Negative for shortness of breath.   Cardiovascular: Negative for chest pain, palpitations, orthopnea and leg swelling.  Gastrointestinal: Negative for abdominal pain.  Genitourinary:       Vaginal discharge Vaginal and perineal itching  Skin: Negative for rash.  Neurological: Negative for dizziness, sensory change, loss of consciousness, weakness and headaches.  Endo/Heme/Allergies:  Negative for polydipsia. Does not bruise/bleed easily.  Psychiatric/Behavioral: Negative for memory loss. The patient does not have insomnia.   All other systems reviewed and are negative.    Observations/Objective: Alert and oriented- answers all questions appropriately No distress    Assessment and Plan: [name] in today with chief complaint of Vaginitis   1. Vaginal candidiasis No bubble baths Void after intercourse - fluconazole (DIFLUCAN) 150 MG tablet; 1 po q week x 4 weeks  Dispense: 4 tablet; Refill: 0     Follow Up Instructions: prn    I discussed the assessment and treatment plan with the patient. The patient was provided an opportunity to ask questions and all were answered. The patient agreed with the plan and demonstrated an understanding of the instructions.   The patient was advised to call back or seek an in-person evaluation if the symptoms worsen or if the condition fails to improve as anticipated.  The above assessment and management plan was discussed with the patient. The patient verbalized understanding of and has agreed to the management plan. Patient is aware to call the clinic if symptoms persist or worsen. Patient is aware when to return to the clinic for a follow-up visit. Patient educated on when it is appropriate to go to the emergency department.   Time call ended:  11:32  I provided 12 minutes of  telephoine face-to-face time during this encounter.    [name], FNP

## 2020-07-01 DIAGNOSIS — M7061 Trochanteric bursitis, right hip: Secondary | ICD-10-CM

## 2020-07-01 DIAGNOSIS — M25561 Pain in right knee: Secondary | ICD-10-CM | POA: Insufficient documentation

## 2020-07-01 DIAGNOSIS — M25562 Pain in left knee: Secondary | ICD-10-CM

## 2020-07-01 HISTORY — DX: Pain in right knee: M25.562

## 2020-07-01 HISTORY — DX: Pain in right knee: M25.561

## 2020-07-01 HISTORY — DX: Trochanteric bursitis, right hip: M70.61

## 2020-07-03 ENCOUNTER — Other Ambulatory Visit: Payer: Self-pay | Admitting: Nurse Practitioner

## 2020-07-03 DIAGNOSIS — R339 Retention of urine, unspecified: Secondary | ICD-10-CM

## 2020-07-06 ENCOUNTER — Encounter: Payer: Self-pay | Admitting: Psychology

## 2020-07-06 ENCOUNTER — Ambulatory Visit: Payer: Medicare Other | Admitting: Psychology

## 2020-07-06 ENCOUNTER — Other Ambulatory Visit: Payer: Self-pay

## 2020-07-06 ENCOUNTER — Other Ambulatory Visit: Payer: Self-pay | Admitting: Nurse Practitioner

## 2020-07-06 ENCOUNTER — Ambulatory Visit (INDEPENDENT_AMBULATORY_CARE_PROVIDER_SITE_OTHER): Payer: Medicare Other | Admitting: Psychology

## 2020-07-06 DIAGNOSIS — R4189 Other symptoms and signs involving cognitive functions and awareness: Secondary | ICD-10-CM

## 2020-07-06 DIAGNOSIS — F319 Bipolar disorder, unspecified: Secondary | ICD-10-CM

## 2020-07-06 DIAGNOSIS — R519 Headache, unspecified: Secondary | ICD-10-CM | POA: Insufficient documentation

## 2020-07-06 DIAGNOSIS — R0683 Snoring: Secondary | ICD-10-CM

## 2020-07-06 DIAGNOSIS — F411 Generalized anxiety disorder: Secondary | ICD-10-CM | POA: Insufficient documentation

## 2020-07-06 DIAGNOSIS — J45909 Unspecified asthma, uncomplicated: Secondary | ICD-10-CM | POA: Insufficient documentation
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
## 2020-07-06 NOTE — Progress Notes (Signed)
Ref sleep study

## 2020-07-06 NOTE — Progress Notes (Addendum)
   Psychometrician Note   Cognitive testing was administered to [name] by Milana Kidney, B.S. (psychometrist) under the supervision of Dr. Christia Reading, Ph.D., licensed psychologist on 07/06/20.  [name] completed a brief block of testing. However, after completing a verbal fluency task, she expressed symptoms of fatigue, stated that she was awoken 1.5 hours prior to her normal wake up time this morning and was concerned that fatigue levels were impacting her performance. While this is certainly possible, it also remains possible that this subjective report was due to perceived poor performance, waning confidence, and increasing anxiety. She agreed to return to complete the remainder of the testing battery on 07/30/2020 at 1:00pm.    The battery of tests administered was selected by Dr. Christia Reading, Ph.D. with consideration to [name]'s current level of functioning, the nature of her symptoms, emotional and behavioral responses during interview, level of literacy, observed level of motivation/effort, and the nature of the referral question. This battery was communicated to the psychometrist. Communication between Dr. Christia Reading, Ph.D. and the psychometrist was ongoing throughout the evaluation and Dr. Christia Reading, Ph.D. was immediately accessible at all times. Dr. Christia Reading, Ph.D. provided supervision to the psychometrist on the date of this service to the extent necessary to assure the quality of all services provided.   A total of 55 minutes of billable time were spent face-to-face with [name] by the psychometrist. This includes both test administration and scoring time. Billing for these services is reflected in the clinical report generated by Dr. Christia Reading, Ph.D.  This note reflects time spent with the psychometrician and does not include test scores or any clinical interpretations made by Dr. Melvyn Novas. The full report will follow in a separate  note.

## 2020-07-12 ENCOUNTER — Telehealth: Payer: Self-pay

## 2020-07-13 ENCOUNTER — Encounter: Payer: Medicare Other | Admitting: Psychology

## 2020-07-13 ENCOUNTER — Ambulatory Visit (INDEPENDENT_AMBULATORY_CARE_PROVIDER_SITE_OTHER): Payer: Medicare Other | Admitting: Nurse Practitioner

## 2020-07-13 ENCOUNTER — Encounter: Payer: Self-pay | Admitting: Nurse Practitioner

## 2020-07-13 ENCOUNTER — Other Ambulatory Visit: Payer: Self-pay | Admitting: Nurse Practitioner

## 2020-07-13 DIAGNOSIS — R5383 Other fatigue: Secondary | ICD-10-CM

## 2020-07-13 DIAGNOSIS — J0110 Acute frontal sinusitis, unspecified: Secondary | ICD-10-CM | POA: Insufficient documentation

## 2020-07-13 MED ORDER — AZITHROMYCIN 250 MG PO TABS
ORAL_TABLET | ORAL | 0 refills | Status: DC
Start: 2020-07-13 — End: 2020-12-07

## 2020-07-13 MED ORDER — SALINE SPRAY 0.65 % NA SOLN: 1.0000 | NASAL | 2 refills | Status: DC | PRN

## 2020-07-13 MED ORDER — BENZONATATE 100 MG PO CAPS: 100.0000 mg | ORAL_CAPSULE | Freq: Three times a day (TID) | ORAL | 0 refills | Status: DC | PRN

## 2020-07-13 MED ORDER — DM-GUAIFENESIN ER 30-600 MG PO TB12: 1.0000 | ORAL_TABLET | Freq: Two times a day (BID) | ORAL | 0 refills | Status: DC

## 2020-07-13 NOTE — Progress Notes (Signed)
   Virtual Visit  Note Due to COVID-19 pandemic this visit was conducted virtually. This visit type was conducted due to national recommendations for restrictions regarding the COVID-19 Pandemic (e.g. social distancing, sheltering in place) in an effort to limit this patient's exposure and mitigate transmission in our community. All issues noted in this document were discussed and addressed.  A physical exam was not performed with this format.  I connected with [name] on 07/13/20 at 11 am by telephone and verified that I am speaking with the correct person using two identifiers. [name] is currently located at home during visit. The provider, Ivy Lynn, NP is located in their office at time of visit.  I discussed the limitations, risks, security and privacy concerns of performing an evaluation and management service by telephone and the availability of in person appointments. I also discussed with the patient that there may be a patient responsible charge related to this service. The patient expressed understanding and agreed to proceed.   History and Present Illness:  Sinusitis This is a recurrent problem. Episode onset: in the past 3-5 days. The problem has been gradually worsening since onset. The maximum temperature recorded prior to her arrival was 100.4 - 100.9 F. The fever has been present for less than 1 day. The pain is moderate. Associated symptoms include chills, congestion, coughing, headaches and sinus pressure. Past treatments include oral decongestants.      Review of Systems  Constitutional: Positive for chills.  HENT: Positive for congestion and sinus pressure.   Respiratory: Positive for cough.   Neurological: Positive for headaches.  All other systems reviewed and are negative.    Observations/Objective: Visit-patient did not sound to be in distress.  Assessment and Plan: Symptoms of fever, chills, chest congestion, headache and sinus  pressure not well controlled.  Started patient on Z-Pak, benzonatate for cough, guaifenesin for congestion, Flonase and Tylenol for headache and fever. Follow Up Instructions: Follow up with worsening unresolved symptoms.    I discussed the assessment and treatment plan with the patient. The patient was provided an opportunity to ask questions and all were answered. The patient agreed with the plan and demonstrated an understanding of the instructions.   The patient was advised to call back or seek an in-person evaluation if the symptoms worsen or if the condition fails to improve as anticipated.  The above assessment and management plan was discussed with the patient. The patient verbalized understanding of and has agreed to the management plan. Patient is aware to call the clinic if symptoms persist or worsen. Patient is aware when to return to the clinic for a follow-up visit. Patient educated on when it is appropriate to go to the emergency department.   Time call ended: 11:09  I provided 9 minutes of  non face-to-face time during this encounter.    Ivy Lynn, NP

## 2020-07-13 NOTE — Assessment & Plan Note (Signed)
Symptoms of fever, chills, chest congestion, headache and sinus pressure not well controlled.  Started patient on Z-Pak, benzonatate for cough, guaifenesin for congestion, Flonase and Tylenol for headache and fever.

## 2020-07-13 NOTE — Patient Instructions (Signed)
Sinusitis, Adult Sinusitis is inflammation of your sinuses. Sinuses are hollow spaces in the bones around your face. Your sinuses are located:  Around your eyes.  In the middle of your forehead.  Behind your nose.  In your cheekbones. Mucus normally drains out of your sinuses. When your nasal tissues become inflamed or swollen, mucus can become trapped or blocked. This allows bacteria, viruses, and fungi to grow, which leads to infection. Most infections of the sinuses are caused by a virus. Sinusitis can develop quickly. It can last for up to 4 weeks (acute) or for more than 12 weeks (chronic). Sinusitis often develops after a cold. What are the causes? This condition is caused by anything that creates swelling in the sinuses or stops mucus from draining. This includes:  Allergies.  Asthma.  Infection from bacteria or viruses.  Deformities or blockages in your nose or sinuses.  Abnormal growths in the nose (nasal polyps).  Pollutants, such as chemicals or irritants in the air.  Infection from fungi (rare). What increases the risk? You are more likely to develop this condition if you:  Have a weak body defense system (immune system).  Do a lot of swimming or diving.  Overuse nasal sprays.  Smoke. What are the signs or symptoms? The main symptoms of this condition are pain and a feeling of pressure around the affected sinuses. Other symptoms include:  Stuffy nose or congestion.  Thick drainage from your nose.  Swelling and warmth over the affected sinuses.  Headache.  Upper toothache.  A cough that may get worse at night.  Extra mucus that collects in the throat or the back of the nose (postnasal drip).  Decreased sense of smell and taste.  Fatigue.  A fever.  Sore throat.  Bad breath. How is this diagnosed? This condition is diagnosed based on:  Your symptoms.  Your medical history.  A physical exam.  Tests to find out if your condition is  acute or chronic. This may include: ? Checking your nose for nasal polyps. ? Viewing your sinuses using a device that has a light (endoscope). ? Testing for allergies or bacteria. ? Imaging tests, such as an MRI or CT scan. In rare cases, a bone biopsy may be done to rule out more serious types of fungal sinus disease. How is this treated? Treatment for sinusitis depends on the cause and whether your condition is chronic or acute.  If caused by a virus, your symptoms should go away on their own within 10 days. You may be given medicines to relieve symptoms. They include: ? Medicines that shrink swollen nasal passages (topical intranasal decongestants). ? Medicines that treat allergies (antihistamines). ? A spray that eases inflammation of the nostrils (topical intranasal corticosteroids). ? Rinses that help get rid of thick mucus in your nose (nasal saline washes).  If caused by bacteria, your health care provider may recommend waiting to see if your symptoms improve. Most bacterial infections will get better without antibiotic medicine. You may be given antibiotics if you have: ? A severe infection. ? A weak immune system.  If caused by narrow nasal passages or nasal polyps, you may need to have surgery. Follow these instructions at home: Medicines  Take, use, or apply over-the-counter and prescription medicines only as told by your health care provider. These may include nasal sprays.  If you were prescribed an antibiotic medicine, take it as told by your health care provider. Do not stop taking the antibiotic even if you start   to feel better. Hydrate and humidify  Drink enough fluid to keep your urine pale yellow. Staying hydrated will help to thin your mucus.  Use a cool mist humidifier to keep the humidity level in your home above 50%.  Inhale steam for 10-15 minutes, 3-4 times a day, or as told by your health care provider. You can do this in the bathroom while a hot shower is  running.  Limit your exposure to cool or dry air.   Rest  Rest as much as possible.  Sleep with your head raised (elevated).  Make sure you get enough sleep each night. General instructions  Apply a warm, moist washcloth to your face 3-4 times a day or as told by your health care provider. This will help with discomfort.  Wash your hands often with soap and water to reduce your exposure to germs. If soap and water are not available, use hand sanitizer.  Do not smoke. Avoid being around people who are smoking (secondhand smoke).  Keep all follow-up visits as told by your health care provider. This is important.   Contact a health care provider if:  You have a fever.  Your symptoms get worse.  Your symptoms do not improve within 10 days. Get help right away if:  You have a severe headache.  You have persistent vomiting.  You have severe pain or swelling around your face or eyes.  You have vision problems.  You develop confusion.  Your neck is stiff.  You have trouble breathing. Summary  Sinusitis is soreness and inflammation of your sinuses. Sinuses are hollow spaces in the bones around your face.  This condition is caused by nasal tissues that become inflamed or swollen. The swelling traps or blocks the flow of mucus. This allows bacteria, viruses, and fungi to grow, which leads to infection.  If you were prescribed an antibiotic medicine, take it as told by your health care provider. Do not stop taking the antibiotic even if you start to feel better.  Keep all follow-up visits as told by your health care provider. This is important. This information is not intended to replace advice given to you by your health care provider. Make sure you discuss any questions you have with your health care provider. Document Revised: 08/20/2017 Document Reviewed: 08/20/2017 Elsevier Patient Education  2021 [employer] Inc.  

## 2020-07-13 NOTE — Progress Notes (Unsigned)
Sleep study

## 2020-07-15 ENCOUNTER — Encounter: Payer: Self-pay | Admitting: Endocrinology

## 2020-07-15 ENCOUNTER — Ambulatory Visit (INDEPENDENT_AMBULATORY_CARE_PROVIDER_SITE_OTHER): Payer: Medicare Other | Admitting: Endocrinology

## 2020-07-15 ENCOUNTER — Other Ambulatory Visit: Payer: Self-pay

## 2020-07-15 DIAGNOSIS — R635 Abnormal weight gain: Secondary | ICD-10-CM

## 2020-07-15 DIAGNOSIS — E079 Disorder of thyroid, unspecified: Secondary | ICD-10-CM | POA: Diagnosis not present

## 2020-07-15 LAB — T4, FREE: Free T4: 0.67 ng/dL (ref 0.60–1.60)

## 2020-07-15 LAB — T3, FREE: T3, Free: 3 pg/mL (ref 2.3–4.2)

## 2020-07-15 LAB — TSH: TSH: 2.91 u[IU]/mL (ref 0.35–4.50)

## 2020-07-15 LAB — CORTISOL: Cortisol, Plasma: <0.4 ug/dL

## 2020-07-15 NOTE — Progress Notes (Signed)
Subjective:    Patient ID: [name], female    DOB: Oct 10, 1975, 45 y.o.   MRN: [id number]  HPI Pt is referred by Eulis Canner, NP, for hypothyroidism.  Pt reports hypothyroidism was dx'ed in.  she has never been on prescribed thyroid hormone therapy.  she has never taken kelp or any other type of non-prescribed thyroid product.  she has never had thyroid imaging.  She is not at risk for pregnancy.  she has never had thyroid surgery, or XRT to the neck.  she has never been on amiodarone.  She stopped lithium several years ago, due to fatigue.  She reports weight gain, dry skin, and fatigue.  Depression is well-controlled by medication.   Past Medical History:  Diagnosis Date  . Anemia 08/19/2018  . Asthma   . Bipolar 1 disorder   . Chronic low back pain 04/25/2017  . Chronic neck pain 04/25/2017  . Chronic obstructive pulmonary disease 11/28/2019  . Chronic pain syndrome 03/05/2016  . DDD (degenerative disc disease), cervical 05/27/2017  . Dysuria 04/30/2020  . Enlarged lymph node 05/30/2017   Seen on CTA- reactive vs sarcoidosis.  . Essential hypertension 09/03/2015  . Generalized anxiety disorder   . H. pylori infection   . Hot flashes not due to menopause 10/14/2012  . Hypokalemia 08/19/2018  . Irritable bowel syndrome without diarrhea 10/15/2014   Seen by Jamse Arn  . Knee pain, bilateral 07/01/2020  . Major depressive disorder 11/13/2018  . Migraine headaches   . Nocturnal hypoxia 08/10/2016  . Pelvic and perineal pain 06/17/2012  . Presence of auditory hallucinations 12/14/2019  . Primary insomnia 11/28/2019  . Rectal prolapse 05/15/2012  . Trochanteric bursitis of right hip 07/01/2020  . Tubular adenoma of colon 03/26/2016   History of advanced adenoma. Surveillance colonoscopy in 2021 negative. Repeat next surveillance colonoscopy due in 5 years (2026).    Past Surgical History:  Procedure Laterality Date  . ABDOMINAL HYSTERECTOMY    . BLADDER SURGERY  2015  .  CHOLECYSTECTOMY      Social History   Socioeconomic History  . Marital status: Married    Spouse name: Not on file  . Number of children: 3  . Years of education: 35  . Highest education level: 11th grade  Occupational History  . Occupation: Disability  Tobacco Use  . Smoking status: Current Every Day Smoker    Packs/day: 1.00    Types: Cigarettes  . Smokeless tobacco: Never Used  Vaping Use  . Vaping Use: Never used  Substance and Sexual Activity  . Alcohol use: No  . Drug use: No  . Sexual activity: Not Currently    Birth control/protection: Surgical  Other Topics Concern  . Not on file  Social History Narrative   Right handed   Drinks caffeine   One story home   Social Determinants of Health   Financial Resource Strain: Not on file  Food Insecurity: Not on file  Transportation Needs: Not on file  Physical Activity: Not on file  Stress: Not on file  Social Connections: Not on file  Intimate Partner Violence: Not on file    Current Outpatient Medications on File Prior to Visit  Medication Sig Dispense Refill  . ALPRAZolam (XANAX) 1 MG tablet Take 1 mg by mouth 4 (four) times daily as needed.    Marland Kitchen azithromycin (ZITHROMAX) 250 MG tablet 500 mg tablet day 1, 250 mg tablet day 2-5 1 each 0  . baclofen (LIORESAL) 10 MG tablet TK  1 T PO TID PRN    . benzonatate (TESSALON PERLES) 100 MG capsule Take 1 capsule (100 mg total) by mouth 3 (three) times daily as needed for cough. 20 capsule 0  . butalbital-acetaminophen-caffeine (FIORICET) 50-325-40 MG tablet Take 1 tablet by mouth every 6 (six) hours as needed for headache or migraine. 10 tablet 0  . Calcium Polycarbophil (FIBER-CAPS PO) Take 1 capsule by mouth daily.     . cetirizine (ZYRTEC ALLERGY) 10 MG tablet Take 1 tablet (10 mg total) by mouth daily. 30 tablet 0  . dextromethorphan-guaiFENesin (MUCINEX DM) 30-600 MG 12hr tablet Take 1 tablet by mouth 2 (two) times daily. 30 tablet 0  . divalproex (DEPAKOTE ER) 500 MG  24 hr tablet Take by mouth daily. Patient reports that she take 2 (500 mg) at bedtime.    . fluconazole (DIFLUCAN) 150 MG tablet 1 po q week x 4 weeks 4 tablet 0  . fluPHENAZine (PROLIXIN) 1 MG tablet Take 1 mg by mouth at bedtime.    . Galcanezumab-gnlm (EMGALITY) 120 MG/ML SOAJ Inject 120 mg into the skin every 28 (twenty-eight) days. 1.12 mL 5  . HYDROcodone-acetaminophen (NORCO) 10-325 MG tablet Take 1 tablet by mouth every 6 (six) hours as needed for moderate pain (For Neck and Back Pain).    Marland Kitchen linaclotide (LINZESS) 145 MCG CAPS capsule Take 1 capsule (145 mcg total) by mouth daily before breakfast. 30 capsule 0  . lisinopril (ZESTRIL) 10 MG tablet Take 1 tablet (10 mg total) by mouth daily. (Patient taking differently: Take 10 mg by mouth daily as needed. For high blood pressure) 90 tablet 1  . omeprazole (PRILOSEC) 40 MG capsule Take 40 mg by mouth daily.    . promethazine (PHENERGAN) 25 MG tablet Take 1 tablet (25 mg total) by mouth every 6 (six) hours as needed for nausea or vomiting. 30 tablet 0  . QUEtiapine (SEROQUEL) 400 MG tablet Take 800 mg by mouth at bedtime.    . sodium chloride (OCEAN) 0.65 % SOLN nasal spray Place 1 spray into both nostrils as needed for congestion. 60 mL 2  . tamsulosin (FLOMAX) 0.4 MG CAPS capsule TAKE 1 CAPSULE BY MOUTH EVERY DAY 30 capsule 2  . vortioxetine HBr (TRINTELLIX) 20 MG TABS tablet Take 1 tablet (20 mg total) by mouth daily. 90 tablet 1   No current facility-administered medications on file prior to visit.    Allergies  Allergen Reactions  . Amoxicillin Nausea And Vomiting  . Chantix [Varenicline] Other (See Comments)    Causes bad nightmares, hallucinations  . Codeine Other (See Comments)    Shaking   . Divalproex Sodium Other (See Comments)    sedation  . Lithium Other (See Comments)    Drowsiness, tremors drowsiness  . Oxycodone-Acetaminophen Other (See Comments)  . Propoxyphene Nausea And Vomiting  . Other     Pt states "I cannot  take steroids"  . Penicillins   . Augmentin [Amoxicillin-Pot Clavulanate] Diarrhea and Other (See Comments)  . Prednisone     Moodiness  . Zolpidem Other (See Comments)    Patient states it does not agree with her body.     Family History  Problem Relation Age of Onset  . Renal Disease Mother   . Heart attack Father   . Thyroid disease Neg Hx     BP 110/64 (BP Location: Right Arm, Patient Position: Sitting, Cuff Size: Normal)   Pulse 91   Ht 5\' 1"  (1.549 m)   Wt 144 lb (  65.3 kg)   SpO2 90%   BMI 27.21 kg/m   Review of Systems denies constipation, numbness, and cold intolerance.      Objective:   Physical Exam VS: see vs page GEN: no distress HEAD: head: no deformity eyes: no periorbital swelling, no proptosis external nose and ears are normal NECK: supple, thyroid is not enlarged CHEST WALL: no deformity LUNGS: clear to auscultation CV: reg rate and rhythm, no murmur.  MUSCULOSKELETAL: gait is normal and steady EXTEMITIES: no deformity.  no leg edema NEURO:  readily moves all 4's.  sensation is intact to touch on all 4's SKIN:  Normal texture and temperature.  No rash or suspicious lesion is visible.   NODES:  None palpable at the neck PSYCH: alert, well-oriented.  Flat affect.     Lab Results  Component Value Date   TSH 1.230 05/10/2020   T4TOTAL 3.7 (L) 05/10/2020   B-12=normal  Heat CT (2020): no mention is made of the pituitary. abd CT (2020): adrenals are normal  I have reviewed outside records, and summarized: Pt was noted to have abnormal TFT, and referred here.  TSH was high during 2020 admission for hypotension.  Opiate meds were reduced.       Assessment & Plan:  Abnormal TFT, new to me.   Weight gain: check cortisol.   Patient Instructions  Blood tests are requested for you today.  We'll let you know about the results.  Even if today's blood tests are normal, the abnormal 2020 TSH tells [place] that you are at risk for abnormal thyroid function.   Therefore, even if today's blood tests are normal, you should have the thyroid checked at least twice a year. Please come back for a follow-up appointment in 6 months.

## 2020-07-15 NOTE — Patient Instructions (Addendum)
Blood tests are requested for you today.  We'll let you know about the results.  Even if today's blood tests are normal, the abnormal 2020 TSH tells [place] that you are at risk for abnormal thyroid function.  Therefore, even if today's blood tests are normal, you should have the thyroid checked at least twice a year. Please come back for a follow-up appointment in 6 months.

## 2020-07-21 ENCOUNTER — Telehealth: Payer: Self-pay | Admitting: Nurse Practitioner

## 2020-07-21 ENCOUNTER — Other Ambulatory Visit: Payer: Self-pay | Admitting: Nurse Practitioner

## 2020-07-21 DIAGNOSIS — R0683 Snoring: Secondary | ICD-10-CM

## 2020-07-21 DIAGNOSIS — R5383 Other fatigue: Secondary | ICD-10-CM

## 2020-07-21 NOTE — Telephone Encounter (Signed)
Please advise 

## 2020-07-30 ENCOUNTER — Encounter: Payer: Medicare Other | Admitting: Psychology

## 2020-08-02 ENCOUNTER — Telehealth: Payer: Self-pay

## 2020-08-05 ENCOUNTER — Other Ambulatory Visit: Payer: Self-pay

## 2020-08-05 DIAGNOSIS — R0683 Snoring: Secondary | ICD-10-CM

## 2020-08-05 NOTE — Telephone Encounter (Signed)
Please review

## 2020-08-05 NOTE — Telephone Encounter (Signed)
Please order sleep study I have ordered it 3 x

## 2020-08-05 NOTE — Telephone Encounter (Signed)
Placed sleep study order and referrals notified

## 2020-08-12 ENCOUNTER — Telehealth: Payer: Self-pay | Admitting: Psychology

## 2020-08-12 NOTE — Telephone Encounter (Signed)
   Neuropsychology [place] [place] [facility]     Ms. Ceesay completed a brief block of testing during her neuropsychological evaluation on 07/06/2020. However, after completing a verbal fluency task, she expressed symptoms of fatigue, stated that she was awoken 1.5 hours prior to her normal wake up time this morning and was concerned that fatigue levels were impacting her performance. While this is certainly possible, it also remains possible that this subjective report was due to perceived poor performance, waning confidence, and increasing anxiety. She agreed to return to complete the remainder of the testing battery on 07/30/2020 at 1:00pm  Prior to her 1:00 appointment on 07/30/2020, she expressed an inability to make that appointment when scheduling staff reached out to her to confirm her appointment. She was rescheduled for 08/13/2020 at 1:00pm.  During an appointment confirmation call this afternoon, Ms. Laye again stated that she would be unable to make her testing appointment. No reason was provided. At this point, we will stop attempts to schedule Ms. Halfmann. Should she wish to engage in testing at a later date, she is encouraged to call the office and schedule at the next available open slot.

## 2020-08-13 ENCOUNTER — Encounter: Payer: Medicare Other | Admitting: Psychology

## 2020-08-24 ENCOUNTER — Ambulatory Visit (INDEPENDENT_AMBULATORY_CARE_PROVIDER_SITE_OTHER): Payer: Medicare Other | Admitting: Nurse Practitioner

## 2020-08-24 ENCOUNTER — Encounter: Payer: Self-pay | Admitting: Nurse Practitioner

## 2020-08-24 DIAGNOSIS — B3783 Candidal cheilitis: Secondary | ICD-10-CM | POA: Insufficient documentation

## 2020-08-24 DIAGNOSIS — B370 Candidal stomatitis: Secondary | ICD-10-CM

## 2020-08-24 MED ORDER — CLOTRIMAZOLE 10 MG MT TROC: 10.0000 mg | Freq: Every day | OROMUCOSAL | 0 refills | Status: DC

## 2020-08-24 NOTE — Patient Instructions (Signed)
Oral Thrush, Adult Oral thrush, also called oral candidiasis, is a fungal infection that develops in the mouth and throat and on the tongue. It causes white patches to form in the mouth and on the tongue. Many cases of thrush are mild, but this infection can also be serious. Thrush can be a repeated (recurrent) problem for certain people who have a weak body defense system (immune system). The weakness can be caused by chronic illnesses, or by taking medicines that limit the body's ability to fight infection. If a person has difficulty fighting infection, the fungus that causes thrush can spread through the body. This can cause life-threatening blood or organ infections. What are the causes? This condition is caused by a fungus (yeast) called Candida albicans.  This fungus is normally present in small amounts in the mouth and on other mucous membranes. It usually causes no harm.  If conditions are present that allow the fungus to grow without control, it invades surrounding tissues and becomes an infection.  Other Candida species can also lead to thrush, though this is rare. What increases the risk? The following factors may make you more likely to develop this condition:  Having a weakened immune system.  Being an older adult.  Having diabetes, cancer, or HIV (human immunodeficiency virus).  Having dry mouth (xerostomia).  Being pregnant or breastfeeding.  Having poor dental care, especially in those who have dentures.  Using antibiotic or steroid medicines. What are the signs or symptoms? Symptoms of this condition can vary from mild and moderate to severe and persistent. Symptoms may include:  A burning feeling in the mouth and throat. This can occur at the start of a thrush infection.  White patches that stick to the mouth and tongue. The tissue around the patches may be red, raw, and painful. If rubbed (during tooth brushing, for example), the patches and the tissue of the mouth  may bleed easily.  A bad taste in the mouth or difficulty tasting foods.  A cottony feeling in the mouth.  Pain during eating and swallowing.  Poor appetite.  Cracking at the corners of the mouth.   How is this diagnosed? This condition is diagnosed based on:  A physical exam.  Your medical history. How is this treated? This condition is treated with medicines called antifungals, which prevent the growth of fungi. These medicines are either applied directly to the affected area (topical) or swallowed (oral). The treatment will depend on the severity of the condition.  Mild cases of thrush may be treated with an antifungal mouth rinse or lozenges. Treatment usually lasts about 14 days.  Moderate to severe cases of thrush can be treated with oral antifungal medicine, if they have spread to the esophagus. A topical antifungal medicine may also be used. For some severe infections, treatment may need to continue for more than 14 days. ? Oral antifungal medicines are rarely used during pregnancy because they may be harmful to the unborn child. If you are pregnant, talk with your health care provider about options for treatment.  Persistent or recurrent thrush. For cases of thrush that do not go away or keep coming back: ? Treatment may be needed twice as long as the symptoms last. ? Treatment will include both oral and topical antifungal medicines. ? People with a weakened immune system can take an antifungal medicine on a continuous basis to prevent thrush infections. It is important to treat conditions that make a person more likely to get thrush, such as   diabetes or HIV. Follow these instructions at home: Medicines  Take or use over-the-counter and prescription medicines only as told by your health care provider.  Talk with your health care provider about an over-the-counter medicine called gentian violet, which kills bacteria and fungi. Relieving soreness and discomfort To help  reduce the discomfort of thrush:  Drink cold liquids such as water or iced tea.  Try flavored ice treats or frozen juices.  Eat foods that are easy to swallow, such as gelatin, ice cream, or custard.  Try drinking from a straw if the patches in your mouth are painful.   General instructions  Eat plain, unflavored yogurt as directed by your health care provider. Check the label to make sure the yogurt contains live cultures. This yogurt can help healthy bacteria grow in the mouth and can stop the growth of the fungus that causes thrush.  If you wear dentures, remove the dentures before going to bed, brush them vigorously, and soak them in a cleaning solution as directed by your health care provider.  Rinse your mouth with a warm salt-water mixture several times a day. To make a salt-water mixture, dissolve -1 tsp (3-6 g) of salt in 1 cup (237 mL) of warm water. Contact a health care provider if:  Your symptoms are getting worse or are not improving within 7 days of starting treatment.  You have symptoms of a spreading infection, such as white patches on the skin outside of the mouth.  You are breastfeeding your baby and you have redness and pain in the nipples. Summary  Oral thrush, also called oral candidiasis, is a fungal infection that develops in the mouth and throat and on the tongue. It causes white patches to form in the mouth and on the tongue.  You are more likely to get this condition if you have a weakened immune system or an underlying condition, such as HIV, cancer, or diabetes.  This condition is treated with medicines called antifungals, which prevent the growth of fungi.  Contact a health care provider if your symptoms do not improve, or get worse, within 7 days of starting treatment. This information is not intended to replace advice given to you by your health care provider. Make sure you discuss any questions you have with your health care provider. Document  Revised: 01/24/2019 Document Reviewed: 01/24/2019 Elsevier Patient Education  2021 [employer]  

## 2020-08-24 NOTE — Assessment & Plan Note (Signed)
Patient is reporting sore in mouth, with angular chilitis. Encourage patient to take medication as prescribed, increase hydration, throw out expired cosmetics lipsticks lip gloss, avoid licking lips, encourage healthy diet, Clotrimazole 10 mg dissolved slowly 5 times daily.

## 2020-08-24 NOTE — Progress Notes (Signed)
.    Virtual Visit  Note Due to COVID-19 pandemic this visit was conducted virtually. This visit type was conducted due to national recommendations for restrictions regarding the COVID-19 Pandemic (e.g. social distancing, sheltering in place) in an effort to limit this patient's exposure and mitigate transmission in our community. All issues noted in this document were discussed and addressed.  A physical exam was not performed with this format.  I connected with [name] on 08/24/20 at 10:18 am by telephone and verified that I am speaking with the correct person using two identifiers. [name] is currently located at home during visit. The provider, Ivy Lynn, NP is located in their office at time of visit.  I discussed the limitations, risks, security and privacy concerns of performing an evaluation and management service by telephone and the availability of in person appointments. I also discussed with the patient that there may be a patient responsible charge related to this service. The patient expressed understanding and agreed to proceed.   History and Present Illness:  HPI  Patient is being seen via telephone visit for angular chill otitis.  Patient is reporting worsening symptoms in the last few days,  the edges of patient's mouth (lips) continues to develop sores and in her buccal cavity.  Patient denies fever or flulike symptoms.  Patient has used nothing for symptoms.  Review of Systems  Constitutional: Negative for chills, fever and malaise/fatigue.  HENT: Positive for ear pain. Negative for congestion, ear discharge, hearing loss, sinus pain, sore throat and tinnitus.   Eyes: Negative.   Respiratory: Negative.   Cardiovascular: Negative.   Skin: Negative for rash.  All other systems reviewed and are negative.    Observations/Objective: Televisit patient did not sound to be in distress. Assessment and Plan: Encourage patient to take medication as  prescribed, increase hydration, throughout expired cosmetics lipsticks lip glasses, avoid licking lips, eye healthy diet, Clotrimazole 10 mg dissolved slowly 5 times daily.  Follow Up Instructions: Follow-up with worsening or unresolved symptoms.    I discussed the assessment and treatment plan with the patient. The patient was provided an opportunity to ask questions and all were answered. The patient agreed with the plan and demonstrated an understanding of the instructions.   The patient was advised to call back or seek an in-person evaluation if the symptoms worsen or if the condition fails to improve as anticipated.  The above assessment and management plan was discussed with the patient. The patient verbalized understanding of and has agreed to the management plan. Patient is aware to call the clinic if symptoms persist or worsen. Patient is aware when to return to the clinic for a follow-up visit. Patient educated on when it is appropriate to go to the emergency department.   Time call ended: 10:29 AM  I provided 11  minutes of  non face-to-face time during this encounter.    Ivy Lynn, NP

## 2020-08-27 ENCOUNTER — Telehealth: Payer: Self-pay | Admitting: Nurse Practitioner

## 2020-08-27 ENCOUNTER — Other Ambulatory Visit: Payer: Self-pay | Admitting: Nurse Practitioner

## 2020-08-27 DIAGNOSIS — B373 Candidiasis of vulva and vagina: Secondary | ICD-10-CM

## 2020-08-27 DIAGNOSIS — B3731 Acute candidiasis of vulva and vagina: Secondary | ICD-10-CM

## 2020-08-27 MED ORDER — FLUCONAZOLE 100 MG PO TABS: 100.0000 mg | ORAL_TABLET | Freq: Every day | ORAL | 0 refills | Status: DC

## 2020-08-27 NOTE — Telephone Encounter (Signed)
Pt had televisit 08/24/2020 yeast on lips no better. Pt says that diflucan has worked in the past. Use [facility]

## 2020-08-27 NOTE — Telephone Encounter (Signed)
Seen Je 5/24 TELE - can difulcan be called in for pt? [facility]

## 2020-09-09 ENCOUNTER — Ambulatory Visit (INDEPENDENT_AMBULATORY_CARE_PROVIDER_SITE_OTHER): Payer: Medicare Other | Admitting: Nurse Practitioner

## 2020-09-09 DIAGNOSIS — N3000 Acute cystitis without hematuria: Secondary | ICD-10-CM

## 2020-09-09 MED ORDER — SULFAMETHOXAZOLE-TRIMETHOPRIM 800-160 MG PO TABS: 1.0000 | ORAL_TABLET | Freq: Two times a day (BID) | ORAL | 0 refills | Status: DC

## 2020-09-09 NOTE — Progress Notes (Signed)
   Virtual Visit  Note Due to COVID-19 pandemic this visit was conducted virtually. This visit type was conducted due to national recommendations for restrictions regarding the COVID-19 Pandemic (e.g. social distancing, sheltering in place) in an effort to limit this patient's exposure and mitigate transmission in our community. All issues noted in this document were discussed and addressed.  A physical exam was not performed with this format.  I connected with [name] on 09/09/20 at 1:22 by telephone and verified that I am speaking with the correct person using two identifiers. [name] is currently located at home and no one is currently with her during visit. The provider, [name], FNP is located in their office at time of visit.  I discussed the limitations, risks, security and privacy concerns of performing an evaluation and management service by telephone and the availability of in person appointments. I also discussed with the patient that there may be a patient responsible charge related to this service. The patient expressed understanding and agreed to proceed.   History and Present Illness:   Chief Complaint: Urinary Tract Infection   HPI Patient calls in c/o lower back pain and suprapubic pain. Has had nocturia which is unusual for her. Slight dysuria. No frequency.    Review of Systems  Constitutional:  Negative for chills and fever.  HENT: Negative.    Respiratory: Negative.    Cardiovascular: Negative.   Gastrointestinal:  Negative for abdominal pain and nausea.  Genitourinary:  Positive for dysuria. Negative for frequency and urgency.  Neurological: Negative.   Psychiatric/Behavioral: Negative.      Observations/Objective: Alert and oriented- answers all questions appropriately No distress   Assessment and Plan: VEGA STARE in today with chief complaint of Urinary Tract Infection   1. Acute cystitis without hematuria Take  medication as prescribe Cotton underwear Take shower not bath Cranberry juice, yogurt Force fluids AZO over the counter X2 days Culture pending RTO prn  - sulfamethoxazole-trimethoprim (BACTRIM DS) 800-160 MG tablet; Take 1 tablet by mouth 2 (two) times daily.  Dispense: 14 tablet; Refill: 0     Follow Up Instructions: prn    I discussed the assessment and treatment plan with the patient. The patient was provided an opportunity to ask questions and all were answered. The patient agreed with the plan and demonstrated an understanding of the instructions.   The patient was advised to call back or seek an in-person evaluation if the symptoms worsen or if the condition fails to improve as anticipated.  The above assessment and management plan was discussed with the patient. The patient verbalized understanding of and has agreed to the management plan. Patient is aware to call the clinic if symptoms persist or worsen. Patient is aware when to return to the clinic for a follow-up visit. Patient educated on when it is appropriate to go to the emergency department.   Time call ended:  1:35  I provided 13 minutes of  non face-to-face time during this encounter.    [name], FNP

## 2020-09-22 ENCOUNTER — Other Ambulatory Visit: Payer: Self-pay | Admitting: *Deleted

## 2020-09-22 DIAGNOSIS — R339 Retention of urine, unspecified: Secondary | ICD-10-CM

## 2020-09-22 MED ORDER — TAMSULOSIN HCL 0.4 MG PO CAPS: 0.4000 mg | ORAL_CAPSULE | Freq: Every day | ORAL | 2 refills | Status: DC

## 2020-09-25 IMAGING — CT CT CHEST WITH CONTRAST
2 of 3 series · 15 of 36 positions shown, 18 images · IV contrast (OMNIPAQUE)
Comparison: Chest x-ray from same day.  CT dated 07/22/2018

CLINICAL DATA: Pneumonia

EXAM:
CT CHEST WITH CONTRAST
TECHNIQUE: Multidetector CT imaging of the chest was performed during
intravenous contrast administration.
CONTRAST:  75mL OMNIPAQUE IOHEXOL 300 MG/ML  SOLN

[Series 3: axial st · axial · 0.67mm/px · z∈[-490,-220]mm · 12 of 159 slices shown, 15 images]
[im 12/159  mediastinal]
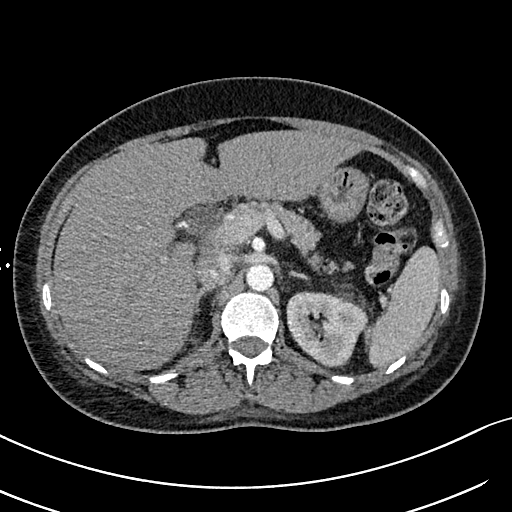
[im 12/159  lung]
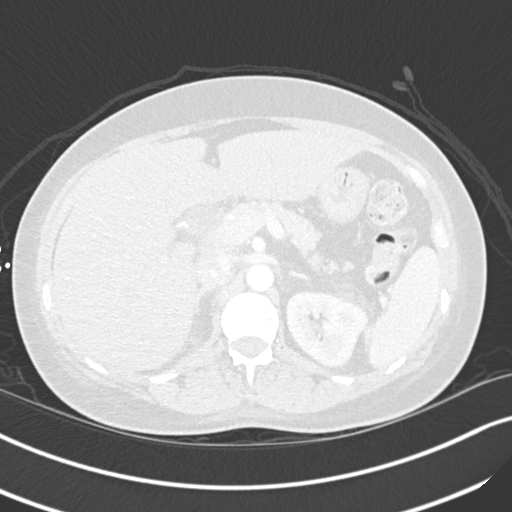
[im 24/159  lung]
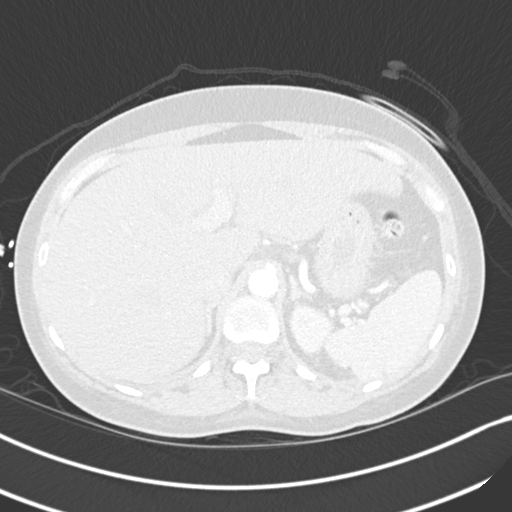
[im 36/159  lung]
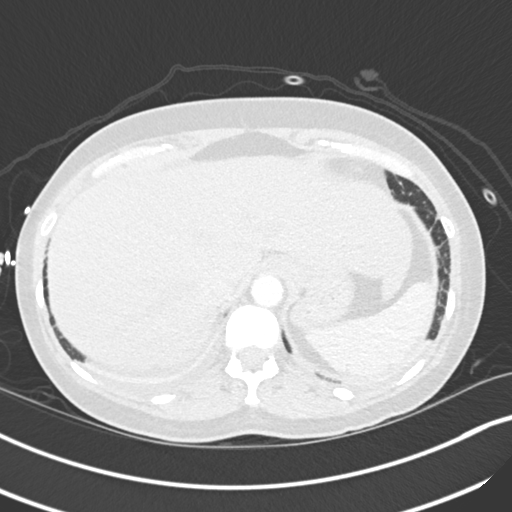
[im 47/159  lung]
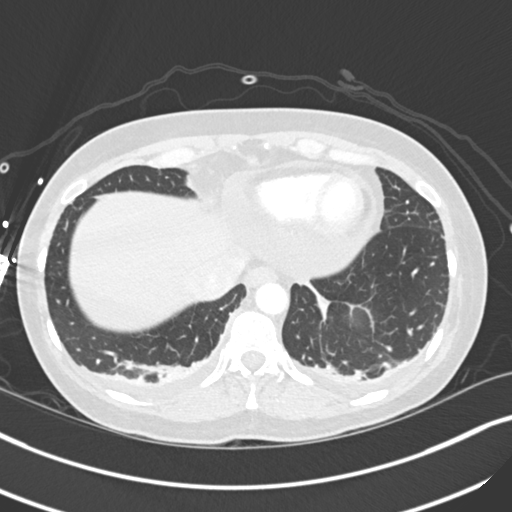
[im 59/159  mediastinal]
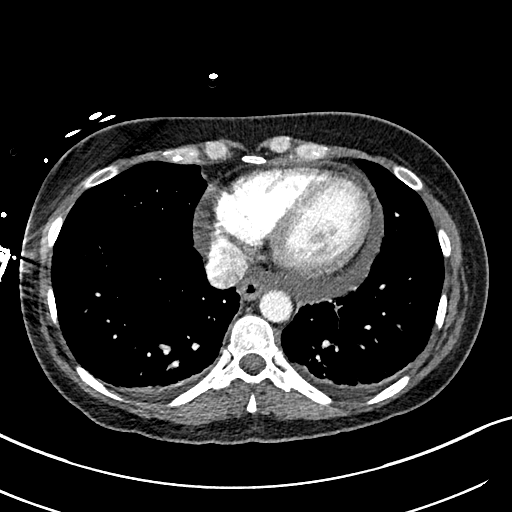
[im 59/159  lung]
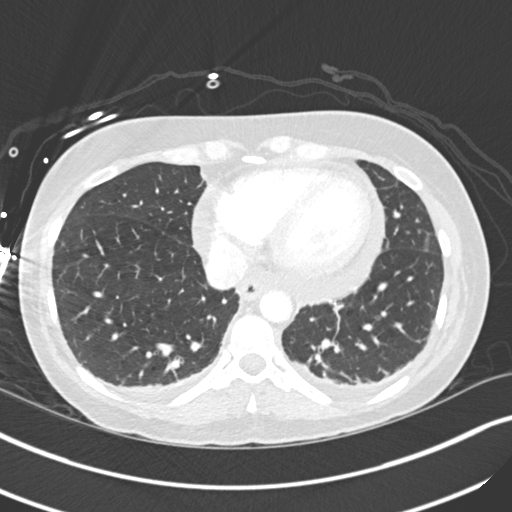
[im 71/159  lung]
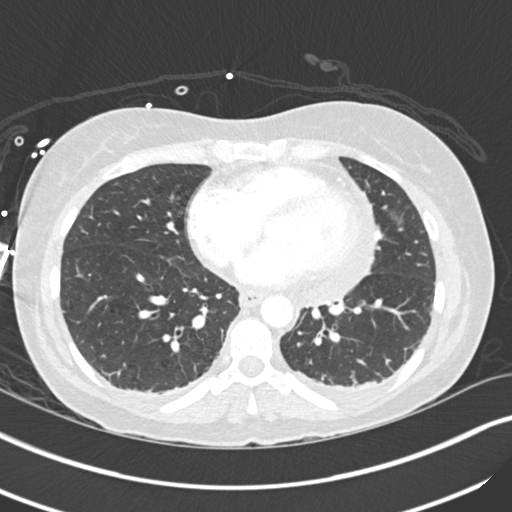
[im 88/159  lung]
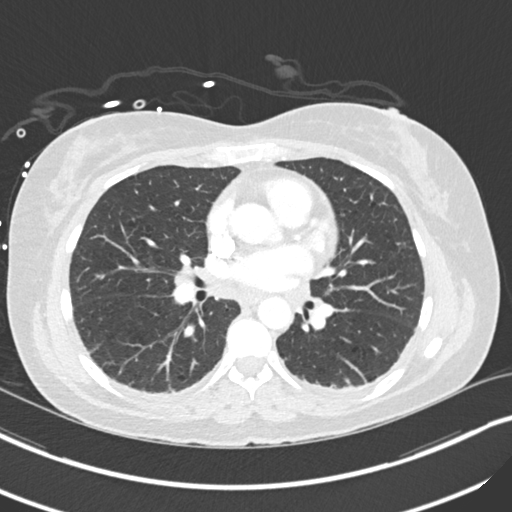
[im 100/159  lung]
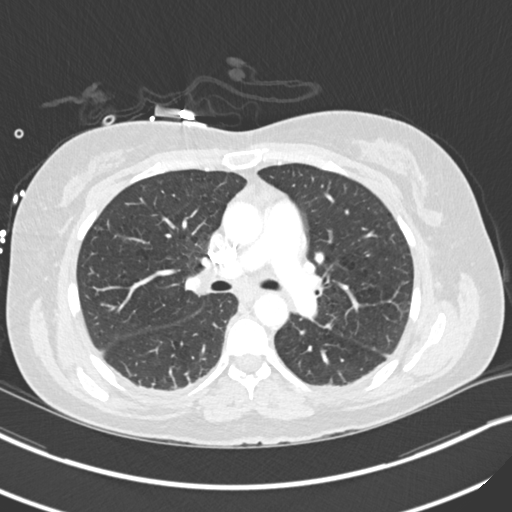
[im 112/159  mediastinal]
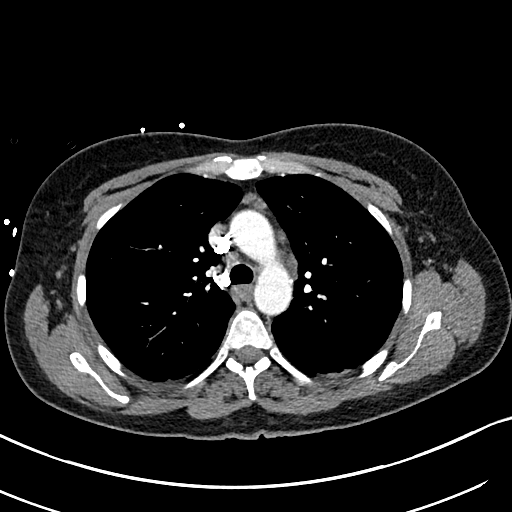
[im 112/159  lung]
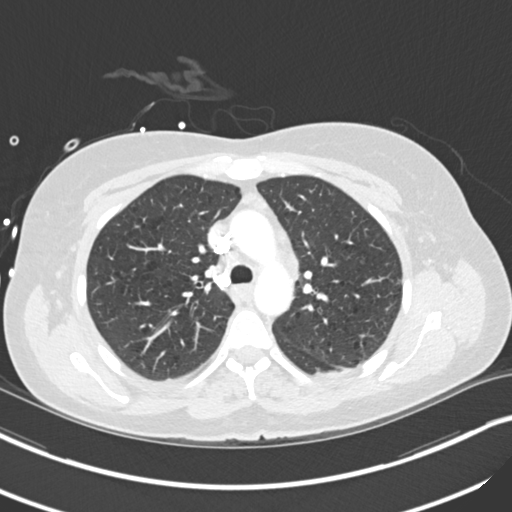
[im 123/159  lung]
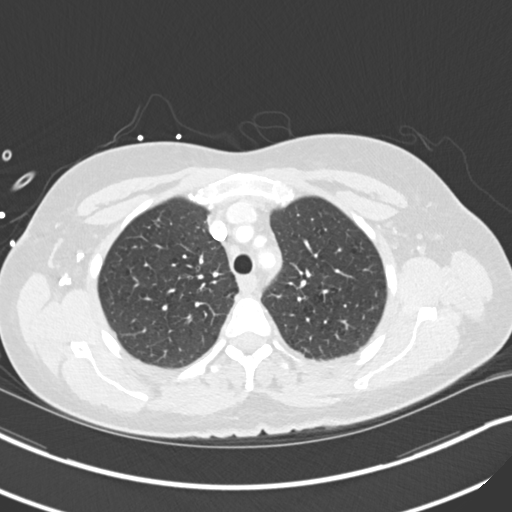
[im 135/159  lung]
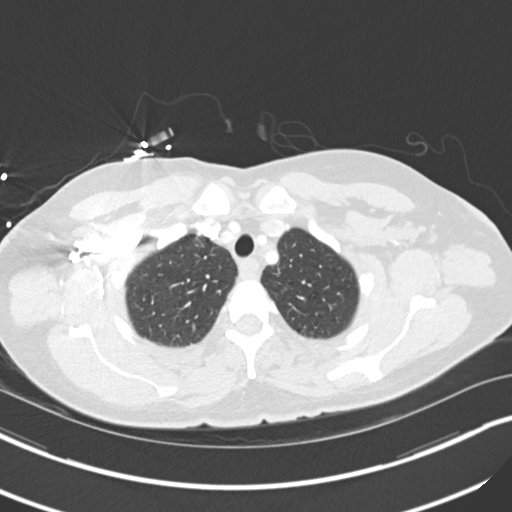
[im 147/159  lung]
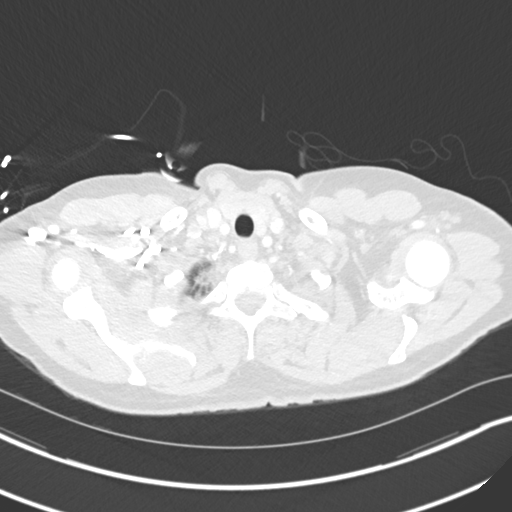

[Series 5: coronal · coronal · 0.62mm/px · 3 of 107 slices shown]
[im 22/107  lung]
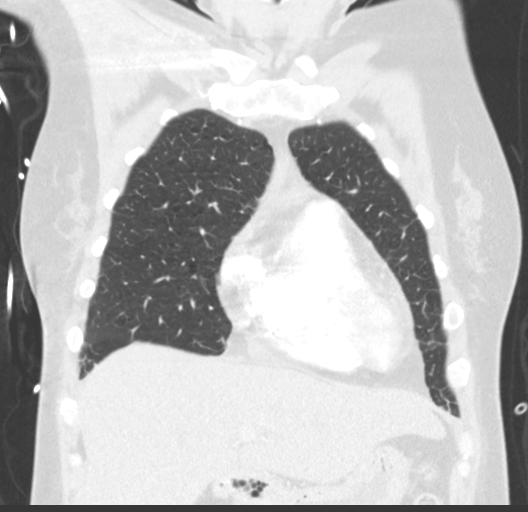
[im 43/107  lung]
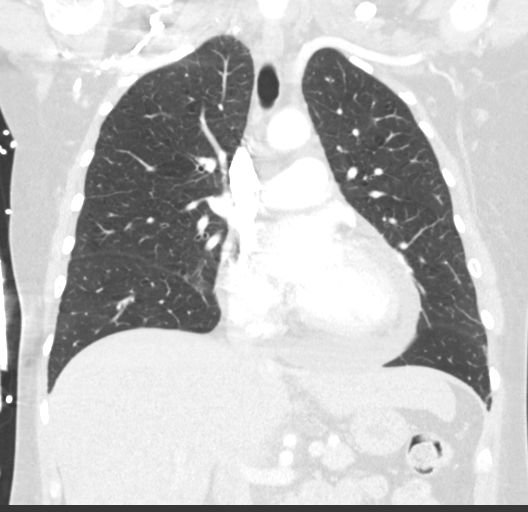
[im 64/107  lung]
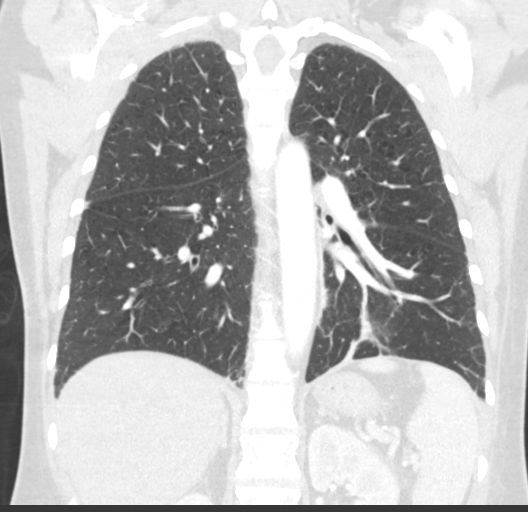

[15 of 36 positions shown; findings below may reference images not displayed]

FINDINGS: Cardiovascular: There is a small to moderate-sized pericardial
effusion. This is slightly improved from prior study. There is
minimal atherosclerotic changes of the thoracic aorta. There is no
evidence of a PE.

Mediastinum/Nodes: There are few prominent mediastinal and hilar
lymph nodes. No pathologically enlarged axillary or supraclavicular
lymph nodes.

Lungs/Pleura: There are small bilateral pleural effusions with
adjacent compressive atelectasis. Mild emphysematous changes are
seen bilaterally. Interlobular septal thickening is noted
bilaterally.

Upper Abdomen: No acute abnormality.

Musculoskeletal: No chest wall abnormality. No acute or significant
osseous findings.
IMPRESSION: 1. No pneumonia.  No evidence of interstitial lung disease.
2. Small to moderate-sized pericardial effusion, improved from prior
study.
3. Trace to small bilateral pleural effusions with associated
interlobular septal thickening is consistent with some degree of
volume overload.
4. Emphysematous changes bilaterally.
5. No large centrally located pulmonary embolus. Detection of
smaller emboli is limited by contrast bolus timing.

Emphysema ([id number]).

## 2020-10-14 ENCOUNTER — Ambulatory Visit (INDEPENDENT_AMBULATORY_CARE_PROVIDER_SITE_OTHER): Payer: Medicare Other

## 2020-10-14 ENCOUNTER — Telehealth: Payer: Self-pay | Admitting: Neurology

## 2020-10-14 ENCOUNTER — Other Ambulatory Visit: Payer: Self-pay

## 2020-10-14 DIAGNOSIS — G43009 Migraine without aura, not intractable, without status migrainosus: Secondary | ICD-10-CM | POA: Diagnosis not present

## 2020-10-14 MED ORDER — DIPHENHYDRAMINE HCL 50 MG/ML IJ SOLN
25.0000 mg | Freq: Once | INTRAMUSCULAR | Status: AC
  Administered 2020-10-14: 25 mg via INTRAMUSCULAR

## 2020-10-14 MED ORDER — METOCLOPRAMIDE HCL 5 MG/ML IJ SOLN
10.0000 mg | Freq: Once | INTRAMUSCULAR | Status: AC
  Administered 2020-10-14: 10 mg via INTRAMUSCULAR

## 2020-10-14 MED ORDER — RIZATRIPTAN BENZOATE 10 MG PO TABS: 10.0000 mg | ORAL_TABLET | ORAL | 0 refills | Status: DC | PRN

## 2020-10-14 MED ORDER — KETOROLAC TROMETHAMINE 60 MG/2ML IM SOLN
60.0000 mg | Freq: Once | INTRAMUSCULAR | Status: AC
  Administered 2020-10-14: 60 mg via INTRAMUSCULAR

## 2020-10-14 NOTE — Progress Notes (Signed)
Pt came in for headache cocktail she tolerated it well. Her driver was in the lobby

## 2020-10-14 NOTE — Telephone Encounter (Signed)
Spoke with pt Per Dr Tomi Likens he does not think anything he prescribes to the pharmacy would get rid of this particular headache.  If she has a driver, she can come to the office for a headache cocktail.  If she doesn't have a driver, then we can give her the Toradol 60mg  shot alone.  pt has a driver she will be here this afternoon   Also Going forward for future migraines, we can send in a prescription for rizatriptan 10mg  - take 1 tablet earliest onset of migraine, may repeat in 2 hours.  Maximum 2 tablets in 24 hours.   Script placed in Epic and sent to pt pharmacy

## 2020-10-14 NOTE — Telephone Encounter (Signed)
Pt called in, is having a throbbing headache. Said she has not had a headache like this in a long time. She took a hydrocodone 10/325. Its been an hr and its still hurting. Was wondering if jaffe could call something in for her.

## 2020-10-21 NOTE — Progress Notes (Unsigned)
Emgality is not approved, must try topiramate , Metoprolol or Propranolol per [facility], sent to [facility] for advice.

## 2020-10-22 ENCOUNTER — Telehealth: Payer: Self-pay | Admitting: Neurology

## 2020-10-22 NOTE — Telephone Encounter (Signed)
As insurance has denied Emgality, please send prescription for propranolol ER '60mg'$  daily.  If no improvement in 4 weeks, we can increase dose.  Please contact patient with this change.  Thank you

## 2020-10-25 NOTE — Telephone Encounter (Signed)
Left message for patient to contact office regarding denial of emgality.

## 2020-11-15 ENCOUNTER — Telehealth: Payer: Self-pay | Admitting: Nurse Practitioner

## 2020-11-15 NOTE — Telephone Encounter (Signed)
Patient is calling her doctor for lab order

## 2020-11-24 ENCOUNTER — Other Ambulatory Visit: Payer: Self-pay

## 2020-11-24 DIAGNOSIS — Z130 Encounter for screening for diseases of the blood and blood-forming organs and certain disorders involving the immune mechanism: Secondary | ICD-10-CM

## 2020-11-24 DIAGNOSIS — K625 Hemorrhage of anus and rectum: Secondary | ICD-10-CM

## 2020-11-24 DIAGNOSIS — Z1329 Encounter for screening for other suspected endocrine disorder: Secondary | ICD-10-CM

## 2020-11-24 DIAGNOSIS — E785 Hyperlipidemia, unspecified: Secondary | ICD-10-CM

## 2020-11-24 DIAGNOSIS — R5383 Other fatigue: Secondary | ICD-10-CM

## 2020-12-07 ENCOUNTER — Ambulatory Visit (INDEPENDENT_AMBULATORY_CARE_PROVIDER_SITE_OTHER): Payer: Medicare Other | Admitting: Family Medicine

## 2020-12-07 ENCOUNTER — Other Ambulatory Visit: Payer: Self-pay

## 2020-12-07 ENCOUNTER — Encounter: Payer: Self-pay | Admitting: Family Medicine

## 2020-12-07 VITALS — BP 148/95 | HR 97 | Temp 97.8°F | Ht 61.0 in | Wt 150.2 lb

## 2020-12-07 DIAGNOSIS — J0100 Acute maxillary sinusitis, unspecified: Secondary | ICD-10-CM

## 2020-12-07 DIAGNOSIS — R197 Diarrhea, unspecified: Secondary | ICD-10-CM | POA: Diagnosis not present

## 2020-12-07 DIAGNOSIS — Z831 Family history of other infectious and parasitic diseases: Secondary | ICD-10-CM

## 2020-12-07 DIAGNOSIS — R059 Cough, unspecified: Secondary | ICD-10-CM | POA: Diagnosis not present

## 2020-12-07 LAB — VERITOR FLU A/B WAIVED
Influenza A: NEGATIVE
Influenza B: NEGATIVE

## 2020-12-07 MED ORDER — AZITHROMYCIN 250 MG PO TABS: ORAL_TABLET | ORAL | 0 refills | Status: DC

## 2020-12-07 NOTE — Progress Notes (Signed)
Assessment & Plan:  1. Family history of tapeworm infection Education provided on tapeworm infections. - Ova and parasite examination; Future  2. Diarrhea, unspecified type - Ova and parasite examination; Future  3. Cough - Novel Coronavirus, NAA ([facility]) - Veritor Flu A/B Waived  4. Acute non-recurrent maxillary sinusitis Education provided on sinusitis.  - azithromycin (ZITHROMAX Z-PAK) 250 MG tablet; Take 2 tablets (500 mg) PO today, then 1 tablet (250 mg) PO daily x4 days.  Dispense: 6 tablet; Refill: 0   Follow up plan: Return if symptoms worsen or fail to improve.  Hendricks Limes, MSN, APRN, FNP-C [facility]  Subjective:   Patient ID: [name], female    DOB: June 17, 1975, 45 y.o.   MRN: [id number]  HPI: [name] is a 45 y.o. female presenting on 12/07/2020 for tap worms (Daughter has tape worms and she wants to make sure she does not have them. ) and Allergies  Patient reports her daughter was recently diagnosed with tapeworms and is currently being treated. It is thought they came from a new dog. The daughter and the dog live with the patient. Patient reports having diarrhea and abdominal pain last night. Denies change in appetite, nausea, vomiting, and anal itching. She has not seen any worms in her stool.   Patient also complains of cough, head congestion, headache, runny nose, sneezing, and watery eyes . Onset of symptoms was 1 week ago, gradually worsening since that time. She is drinking plenty of fluids. Evaluation to date: at home COVID test negative this morning. Treatment to date: antihistamines and decongestants. She has a history of asthma. She does smoke. Patient has not been fully vaccinated against COVID-19.   ROS: Negative unless specifically indicated above in HPI.   Relevant past medical history reviewed and updated as indicated.   Allergies and medications reviewed and updated.   Current Outpatient Medications:     ALPRAZolam (XANAX) 1 MG tablet, Take 1 mg by mouth 4 (four) times daily as needed., Disp: , Rfl:    baclofen (LIORESAL) 10 MG tablet, TK 1 T PO TID PRN, Disp: , Rfl:    butalbital-acetaminophen-caffeine (FIORICET) 50-325-40 MG tablet, Take 1 tablet by mouth every 6 (six) hours as needed for headache or migraine., Disp: 10 tablet, Rfl: 0   Calcium Polycarbophil (FIBER-CAPS PO), Take 1 capsule by mouth daily. , Disp: , Rfl:    cetirizine (ZYRTEC ALLERGY) 10 MG tablet, Take 1 tablet (10 mg total) by mouth daily., Disp: 30 tablet, Rfl: 0   clotrimazole (MYCELEX) 10 MG troche, Take 1 tablet (10 mg total) by mouth 5 (five) times daily., Disp: 70 tablet, Rfl: 0   divalproex (DEPAKOTE ER) 500 MG 24 hr tablet, Take by mouth daily. Patient reports that she take 2 (500 mg) at bedtime., Disp: , Rfl:    fluconazole (DIFLUCAN) 100 MG tablet, Take 1 tablet (100 mg total) by mouth daily. 200 mg tablet by mouth once [2 tablet] first day, then 100 mg by mouth once daily day 2-13, Disp: 15 tablet, Rfl: 0   fluPHENAZine (PROLIXIN) 1 MG tablet, Take 1 mg by mouth at bedtime., Disp: , Rfl:    Galcanezumab-gnlm (EMGALITY) 120 MG/ML SOAJ, Inject 120 mg into the skin every 28 (twenty-eight) days., Disp: 1.12 mL, Rfl: 5   HYDROcodone-acetaminophen (NORCO) 10-325 MG tablet, Take 1 tablet by mouth every 6 (six) hours as needed for moderate pain (For Neck and Back Pain)., Disp: , Rfl:    linaclotide (LINZESS) 145  MCG CAPS capsule, Take 1 capsule (145 mcg total) by mouth daily before breakfast., Disp: 30 capsule, Rfl: 0   lisinopril (ZESTRIL) 10 MG tablet, Take 1 tablet (10 mg total) by mouth daily. (Patient taking differently: Take 10 mg by mouth daily as needed. For high blood pressure), Disp: 90 tablet, Rfl: 1   omeprazole (PRILOSEC) 40 MG capsule, Take 40 mg by mouth daily., Disp: , Rfl:    promethazine (PHENERGAN) 25 MG tablet, Take 1 tablet (25 mg total) by mouth every 6 (six) hours as needed for nausea or vomiting.,  Disp: 30 tablet, Rfl: 0   QUEtiapine (SEROQUEL) 400 MG tablet, Take 800 mg by mouth at bedtime., Disp: , Rfl:    rizatriptan (MAXALT) 10 MG tablet, Take 1 tablet (10 mg total) by mouth as needed for migraine. May repeat in 2 hours if needed. (Max 2 tablets in 24 hours), Disp: 10 tablet, Rfl: 0   sodium chloride (OCEAN) 0.65 % SOLN nasal spray, Place 1 spray into both nostrils as needed for congestion., Disp: 60 mL, Rfl: 2   tamsulosin (FLOMAX) 0.4 MG CAPS capsule, Take 1 capsule (0.4 mg total) by mouth daily., Disp: 30 capsule, Rfl: 2   vortioxetine HBr (TRINTELLIX) 20 MG TABS tablet, Take 1 tablet (20 mg total) by mouth daily., Disp: 90 tablet, Rfl: 1  Allergies  Allergen Reactions   Amoxicillin Nausea And Vomiting   Chantix [Varenicline] Other (See Comments)    Causes bad nightmares, hallucinations   Codeine Other (See Comments)    Shaking    Divalproex Sodium Other (See Comments)    sedation   Lithium Other (See Comments)    Drowsiness, tremors drowsiness   Oxycodone-Acetaminophen Other (See Comments)   Propoxyphene Nausea And Vomiting   Other     Pt states "I cannot take steroids"   Penicillins    Augmentin [Amoxicillin-Pot Clavulanate] Diarrhea and Other (See Comments)   Prednisone     Moodiness   Zolpidem Other (See Comments)    Patient states it does not agree with her body.     Objective:   BP (!) 148/95   Pulse 97   Temp 97.8 F (36.6 C) (Temporal)   Ht '5\' 1"'$  (1.549 m)   Wt 150 lb 3.2 oz (68.1 kg)   BMI 28.38 kg/m    Physical Exam Vitals reviewed.  Constitutional:      General: She is not in acute distress.    Appearance: Normal appearance. She is not ill-appearing, toxic-appearing or diaphoretic.  HENT:     Head: Normocephalic and atraumatic.     Right Ear: Tympanic membrane, ear canal and external ear normal. There is no impacted cerumen.     Left Ear: Tympanic membrane, ear canal and external ear normal. There is no impacted cerumen.     Nose: Congestion  present.     Right Sinus: Maxillary sinus tenderness present. No frontal sinus tenderness.     Left Sinus: Maxillary sinus tenderness present. No frontal sinus tenderness.     Mouth/Throat:     Mouth: Mucous membranes are moist.     Pharynx: Oropharynx is clear. No oropharyngeal exudate or posterior oropharyngeal erythema.  Eyes:     General: No scleral icterus.       Right eye: No discharge.        Left eye: No discharge.     Conjunctiva/sclera: Conjunctivae normal.  Cardiovascular:     Rate and Rhythm: Normal rate.  Pulmonary:     Effort: Pulmonary effort  is normal. No respiratory distress.  Abdominal:     General: Abdomen is flat. Bowel sounds are normal. There is no distension.     Palpations: There is no shifting dullness, fluid wave, hepatomegaly, splenomegaly, mass or pulsatile mass.     Tenderness: There is abdominal tenderness in the right lower quadrant. There is no guarding or rebound.     Hernia: No hernia is present.  Musculoskeletal:        General: Normal range of motion.     Cervical back: Normal range of motion.  Lymphadenopathy:     Cervical: No cervical adenopathy.  Skin:    General: Skin is warm and dry.     Capillary Refill: Capillary refill takes less than 2 seconds.  Neurological:     General: No focal deficit present.     Mental Status: She is alert and oriented to person, place, and time. Mental status is at baseline.  Psychiatric:        Mood and Affect: Mood normal.        Behavior: Behavior normal.        Thought Content: Thought content normal.        Judgment: Judgment normal.

## 2020-12-08 LAB — NOVEL CORONAVIRUS, NAA: SARS-CoV-2, NAA: NOT DETECTED

## 2020-12-08 LAB — SARS-COV-2, NAA 2 DAY TAT

## 2020-12-09 ENCOUNTER — Other Ambulatory Visit: Payer: Medicare Other

## 2020-12-09 ENCOUNTER — Other Ambulatory Visit: Payer: Self-pay

## 2020-12-09 DIAGNOSIS — Z831 Family history of other infectious and parasitic diseases: Secondary | ICD-10-CM

## 2020-12-09 DIAGNOSIS — R197 Diarrhea, unspecified: Secondary | ICD-10-CM
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
## 2020-12-13 ENCOUNTER — Telehealth: Payer: Self-pay | Admitting: Nurse Practitioner

## 2020-12-13 ENCOUNTER — Ambulatory Visit: Payer: Medicare Other | Admitting: Neurology

## 2020-12-13 MED ORDER — FLUCONAZOLE 100 MG PO TABS: 100.0000 mg | ORAL_TABLET | Freq: Every day | ORAL | 0 refills | Status: DC

## 2020-12-13 NOTE — Telephone Encounter (Signed)
Patient notified and verbalized understanding. 

## 2020-12-13 NOTE — Telephone Encounter (Signed)
Difluvcan sent to pharmacy

## 2020-12-13 NOTE — Telephone Encounter (Signed)
  Prescription Request  12/13/2020  Is this a "Controlled Substance" medicine? no Have you seen your PCP in the last 2 weeks? 12/09/2020 If YES, route message to pool  -  If NO, patient needs to be scheduled for appointment.  What is the name of the medication or equipment?pt has yeast infection from antibiotics  Have you contacted your pharmacy to request a refill?   Which pharmacy would you like this sent to? [facility]   Patient notified that their request is being sent to the clinical staff for review and that they should receive a response within 2 business days.

## 2020-12-22 DIAGNOSIS — R9389 Abnormal findings on diagnostic imaging of other specified body structures: Secondary | ICD-10-CM | POA: Insufficient documentation

## 2020-12-24 LAB — OVA AND PARASITE EXAMINATION

## 2020-12-27 ENCOUNTER — Other Ambulatory Visit: Payer: Self-pay | Admitting: Nurse Practitioner

## 2020-12-27 DIAGNOSIS — R339 Retention of urine, unspecified: Secondary | ICD-10-CM

## 2021-01-18 ENCOUNTER — Ambulatory Visit: Payer: Medicare Other | Admitting: Endocrinology

## 2021-02-08 ENCOUNTER — Ambulatory Visit (INDEPENDENT_AMBULATORY_CARE_PROVIDER_SITE_OTHER): Payer: Medicare Other

## 2021-02-08 VITALS — Ht 61.0 in | Wt 150.0 lb

## 2021-02-08 DIAGNOSIS — Z0000 Encounter for general adult medical examination without abnormal findings: Secondary | ICD-10-CM | POA: Diagnosis not present

## 2021-02-08 DIAGNOSIS — Z1231 Encounter for screening mammogram for malignant neoplasm of breast: Secondary | ICD-10-CM

## 2021-02-08 NOTE — Progress Notes (Signed)
Subjective:   [name] is a 45 y.o. female who presents for Medicare Annual (Subsequent) preventive examination.  Review of Systems     Cardiac Risk Factors include: smoking/ tobacco exposure;sedentary lifestyle;dyslipidemia;hypertension;Other (see comment), Risk factor comments: COPD     Objective:    Today's Vitals   02/08/21 0905 02/08/21 0906  Weight: 150 lb (68 kg)   Height: 5\' 1"  (1.549 m)   PainSc:  2    Body mass index is 28.34 kg/m.  Advanced Directives 02/08/2021 06/08/2020 12/16/2019 12/03/2019 11/09/2019 06/24/2019 06/15/2019  Does Patient Have a Medical Advance Directive? No No No No No No No  Would patient like information on creating a medical advance directive? No - Patient declined - No - Patient declined - - No - Patient declined -  Some encounter information is confidential and restricted. Go to Review Flowsheets activity to see all data.    Current Medications (verified) Outpatient Encounter Medications as of 02/08/2021  Medication Sig   omeprazole (PRILOSEC) 40 MG capsule Take by mouth.   ALPRAZolam (XANAX) 1 MG tablet Take 1 mg by mouth 4 (four) times daily as needed.   azithromycin (ZITHROMAX Z-PAK) 250 MG tablet Take 2 tablets (500 mg) PO today, then 1 tablet (250 mg) PO daily x4 days.   baclofen (LIORESAL) 10 MG tablet TK 1 T PO TID PRN   Calcium Polycarbophil (FIBER-CAPS PO) Take 1 capsule by mouth daily.    cetirizine (ZYRTEC ALLERGY) 10 MG tablet Take 1 tablet (10 mg total) by mouth daily.   clotrimazole (MYCELEX) 10 MG troche Take 1 tablet (10 mg total) by mouth 5 (five) times daily.   divalproex (DEPAKOTE ER) 500 MG 24 hr tablet Take by mouth daily. Patient reports that she take 2 (500 mg) at bedtime.   fluconazole (DIFLUCAN) 100 MG tablet Take 1 tablet (100 mg total) by mouth daily. 200 mg tablet by mouth once [2 tablet] first day, then 100 mg by mouth once daily day 2-13   fluPHENAZine (PROLIXIN) 1 MG tablet Take 1 mg by mouth at bedtime.    Galcanezumab-gnlm (EMGALITY) 120 MG/ML SOAJ Inject 120 mg into the skin every 28 (twenty-eight) days.   HYDROcodone-acetaminophen (NORCO) 10-325 MG tablet Take 1 tablet by mouth every 6 (six) hours as needed for moderate pain (For Neck and Back Pain).   linaclotide (LINZESS) 145 MCG CAPS capsule Take 1 capsule (145 mcg total) by mouth daily before breakfast.   lisinopril (ZESTRIL) 10 MG tablet Take 1 tablet (10 mg total) by mouth daily. (Patient taking differently: Take 10 mg by mouth daily as needed. For high blood pressure)   naloxone (NARCAN) nasal spray 4 mg/0.1 mL    promethazine (PHENERGAN) 25 MG tablet Take 1 tablet (25 mg total) by mouth every 6 (six) hours as needed for nausea or vomiting.   QUEtiapine (SEROQUEL) 400 MG tablet Take 800 mg by mouth at bedtime.   rizatriptan (MAXALT) 10 MG tablet Take 1 tablet (10 mg total) by mouth as needed for migraine. May repeat in 2 hours if needed. (Max 2 tablets in 24 hours)   sodium chloride (OCEAN) 0.65 % SOLN nasal spray Place 1 spray into both nostrils as needed for congestion.   tamsulosin (FLOMAX) 0.4 MG CAPS capsule TAKE 1 CAPSULE BY MOUTH EVERY DAY   vortioxetine HBr (TRINTELLIX) 20 MG TABS tablet Take 1 tablet (20 mg total) by mouth daily.   [DISCONTINUED] omeprazole (PRILOSEC) 40 MG capsule Take 40 mg by mouth daily.  No facility-administered encounter medications on file as of 02/08/2021.    Allergies (verified) Amoxicillin, Chantix [varenicline], Codeine, Divalproex sodium, Lithium, Oxycodone-acetaminophen, Propoxyphene, Other, Penicillins, Augmentin [amoxicillin-pot clavulanate], Prednisone, and Zolpidem   History: Past Medical History:  Diagnosis Date   Anemia 08/19/2018   Asthma    Bipolar 1 disorder    Chronic low back pain 04/25/2017   Chronic neck pain 04/25/2017   Chronic obstructive pulmonary disease 11/28/2019   Chronic pain syndrome 03/05/2016   DDD (degenerative disc disease), cervical 05/27/2017   Dysuria  04/30/2020   Enlarged lymph node 05/30/2017   Seen on CTA- reactive vs sarcoidosis.   Essential hypertension 09/03/2015   Generalized anxiety disorder    H. pylori infection    Hot flashes not due to menopause 10/14/2012   Hypokalemia 08/19/2018   Irritable bowel syndrome without diarrhea 10/15/2014   Seen by Jamse Arn   Knee pain, bilateral 07/01/2020   Major depressive disorder 11/13/2018   Migraine headaches    Nocturnal hypoxia 08/10/2016   Pelvic and perineal pain 06/17/2012   Presence of auditory hallucinations 12/14/2019   Primary insomnia 11/28/2019   Rectal prolapse 05/15/2012   Trochanteric bursitis of right hip 07/01/2020   Tubular adenoma of colon 03/26/2016   History of advanced adenoma. Surveillance colonoscopy in 2021 negative. Repeat next surveillance colonoscopy due in 5 years (2026).   Past Surgical History:  Procedure Laterality Date   ABDOMINAL HYSTERECTOMY     BLADDER SURGERY  2015   CHOLECYSTECTOMY     Family History  Problem Relation Age of Onset   Renal Disease Mother    Heart attack Father    Thyroid disease Neg Hx    Social History   Socioeconomic History   Marital status: Legally Separated    Spouse name: Not on file   Number of children: 3   Years of education: 11   Highest education level: 11th grade  Occupational History   Occupation: Disability  Tobacco Use   Smoking status: Every Day    Packs/day: 1.00    Types: Cigarettes   Smokeless tobacco: Never  Vaping Use   Vaping Use: Never used  Substance and Sexual Activity   Alcohol use: No   Drug use: No   Sexual activity: Not Currently    Birth control/protection: Surgical  Other Topics Concern   Not on file  Social History Narrative   Right handed   Drinks caffeine   One story home   Social Determinants of Health   Financial Resource Strain: Low Risk    Difficulty of Paying Living Expenses: Not hard at all  Food Insecurity: No Food Insecurity   Worried About Charity fundraiser  in the Last Year: Never true   Ran Out of Food in the Last Year: Never true  Transportation Needs: No Transportation Needs   Lack of Transportation (Medical): No   Lack of Transportation (Non-Medical): No  Physical Activity: Sufficiently Active   Days of Exercise per Week: 3 days   Minutes of Exercise per Session: 50 min  Stress: No Stress Concern Present   Feeling of Stress : Only a little  Social Connections: Socially Isolated   Frequency of Communication with Friends and Family: More than three times a week   Frequency of Social Gatherings with Friends and Family: More than three times a week   Attends Religious Services: Never   Marine scientist or Organizations: No   Attends Archivist Meetings: Never   Marital Status: Separated  Tobacco Counseling Ready to quit: Not Answered Counseling given: Not Answered   Clinical Intake:  Pre-visit preparation completed: Yes  Pain : 0-10 Pain Score: 2  Pain Type: Chronic pain Pain Location: Back Pain Descriptors / Indicators: Aching Pain Onset: More than a month ago Pain Frequency: Intermittent     BMI - recorded: 28.34 Nutritional Status: BMI 25 -29 Overweight Nutritional Risks: None Diabetes: No CBG done?: No Did pt. bring in CBG monitor from home?: No  How often do you need to have someone help you when you read instructions, pamphlets, or other written materials from your doctor or pharmacy?: 1 - Never  Diabetic? no  Interpreter Needed?: No  Information entered by :: Royale Lennartz, LPN   Activities of Daily Living In your present state of health, do you have any difficulty performing the following activities: 02/08/2021  Hearing? N  Vision? N  Difficulty concentrating or making decisions? Y  Comment feels like she can't focus - cannot comprehend simple movies sometimes  Walking or climbing stairs? Y  Comment hurts back  Dressing or bathing? N  Doing errands, shopping? N  Preparing Food and  eating ? N  Using the Toilet? N  In the past six months, have you accidently leaked urine? Y  Comment worse for the past month - wants this addresse at next visit  Do you have problems with loss of bowel control? N  Managing your Medications? N  Managing your Finances? N  Housekeeping or managing your Housekeeping? N  Some recent data might be hidden    Patient Care Team: Chevis Pretty, FNP as PCP - General (Family Medicine)  Indicate any recent Medical Services you may have received from other than [facility] providers in the past year (date may be approximate).     Assessment:   This is a routine wellness examination for [facility].  Hearing/Vision screen Hearing Screening - Comments:: Denies hearing difficulties  Vision Screening - Comments:: Wears rx glasses - up to date with annual eye exams with [facility]  Dietary issues and exercise activities discussed: Current Exercise Habits: Home exercise routine, Type of exercise: walking;exercise ball;stretching, Time (Minutes): 30, Frequency (Times/Week): 4, Weekly Exercise (Minutes/Week): 120, Intensity: Moderate, Exercise limited by: psychological condition(s)   Goals Addressed             This Visit's Progress    DIET - INCREASE WATER INTAKE   On track    Try to drink 6-8 glasses of water daily       Depression Screen PHQ 2/9 Scores 02/08/2021 12/07/2020 01/30/2020 12/10/2019 11/28/2019 04/02/2019 12/24/2018  PHQ - 2 Score 2 2 0 0 2 1 4   PHQ- 9 Score 4 4 - - 8 - 8  Some encounter information is confidential and restricted. Go to Review Flowsheets activity to see all data.    Fall Risk Fall Risk  02/08/2021 12/07/2020 06/08/2020 01/30/2020 12/10/2019  Falls in the past year? 0 0 1 0 0  Number falls in past yr: 0 - 1 0 -  Injury with Fall? 0 - 1 0 -  Risk for fall due to : Orthopedic patient;Medication side effect - - No Fall Risks -  Follow up Falls prevention discussed - - Falls evaluation completed -    FALL RISK  PREVENTION PERTAINING TO THE HOME:  Any stairs in or around the home? No  If so, are there any without handrails? No  Home free of loose throw rugs in walkways, pet beds, electrical cords, etc? Yes  Adequate lighting in your home to reduce risk of falls? Yes   ASSISTIVE DEVICES UTILIZED TO PREVENT FALLS:  Life alert? No  Use of a cane, walker or w/c? No  Grab bars in the bathroom? No  Shower chair or bench in shower? No  Elevated toilet seat or a handicapped toilet? No   TIMED UP AND GO:  Was the test performed? No . Telephonic visit  Cognitive Function:     6CIT Screen 02/08/2021 04/02/2019  What Year? 0 points 0 points  What month? 0 points 0 points  What time? 0 points 0 points  Count back from 20 0 points 0 points  Months in reverse 0 points 0 points  Repeat phrase 2 points 0 points  Total Score 2 0    Immunizations  There is no immunization history on file for this patient.  TDAP status: Due, Education has been provided regarding the importance of this vaccine. Advised may receive this vaccine at local pharmacy or Health Dept. Aware to provide a copy of the vaccination record if obtained from local pharmacy or Health Dept. Verbalized acceptance and understanding.  Flu Vaccine status: Declined, Education has been provided regarding the importance of this vaccine but patient still declined. Advised may receive this vaccine at local pharmacy or Health Dept. Aware to provide a copy of the vaccination record if obtained from local pharmacy or Health Dept. Verbalized acceptance and understanding.  Pneumococcal vaccine status: Declined,  Education has been provided regarding the importance of this vaccine but patient still declined. Advised may receive this vaccine at local pharmacy or Health Dept. Aware to provide a copy of the vaccination record if obtained from local pharmacy or Health Dept. Verbalized acceptance and understanding.   Covid-19 vaccine status: Declined,  Education has been provided regarding the importance of this vaccine but patient still declined. Advised may receive this vaccine at local pharmacy or Health Dept.or vaccine clinic. Aware to provide a copy of the vaccination record if obtained from local pharmacy or Health Dept. Verbalized acceptance and understanding.  Qualifies for Shingles Vaccine? No   Zostavax completed No   Shingrix Completed?: No.    Education has been provided regarding the importance of this vaccine. Patient has been advised to call insurance company to determine out of pocket expense if they have not yet received this vaccine. Advised may also receive vaccine at local pharmacy or Health Dept. Verbalized acceptance and understanding.  Screening Tests Health Maintenance  Topic Date Due   Pneumococcal Vaccine 84-52 Years old (1 - PCV) Never done   TETANUS/TDAP  Never done   MAMMOGRAM  02/22/2011   COVID-19 Vaccine (1) 02/24/2021 (Originally 11/10/1975)   INFLUENZA VACCINE  07/01/2021 (Originally 11/01/2020)   PAP SMEAR-Modifier  04/25/2021   COLONOSCOPY (Pts 45-82yrs Insurance coverage will need to be confirmed)  02/08/2025   Hepatitis C Screening  Completed   HIV Screening  Completed   HPV VACCINES  Aged Out    Health Maintenance  Health Maintenance Due  Topic Date Due   Pneumococcal Vaccine 48-86 Years old (1 - PCV) Never done   TETANUS/TDAP  Never done   MAMMOGRAM  02/22/2011    Colorectal cancer screening: Type of screening: Colonoscopy. Completed 02/09/2020. Repeat every 5 years  Mammogram status: Ordered 02/08/2021. Pt provided with contact info and advised to call to schedule appt.   Bone Density scan due at age 29  Lung Cancer Screening: (Low Dose CT Chest recommended if Age 64-80 years, 30 pack-year currently smoking  OR have quit w/in 15years.) does not qualify.  Additional Screening:  Hepatitis C Screening: does not qualify  Vision Screening: Recommended annual ophthalmology exams for early  detection of glaucoma and other disorders of the eye. Is the patient up to date with their annual eye exam?  Yes  Who is the provider or what is the name of the office in which the patient attends annual eye exams? [facility] If pt is not established with a provider, would they like to be referred to a provider to establish care? No .   Dental Screening: Recommended annual dental exams for proper oral hygiene  Community Resource Referral / Chronic Care Management: CRR required this visit?  No   CCM required this visit?  No      Plan:     I have personally reviewed and noted the following in the patient's chart:   Medical and social history Use of alcohol, tobacco or illicit drugs  Current medications and supplements including opioid prescriptions.  Functional ability and status Nutritional status Physical activity Advanced directives List of other physicians Hospitalizations, surgeries, and ER visits in previous 12 months Vitals Screenings to include cognitive, depression, and falls Referrals and appointments  In addition, I have reviewed and discussed with patient certain preventive protocols, quality metrics, and best practice recommendations. A written personalized care plan for preventive services as well as general preventive health recommendations were provided to patient.     Sandrea Hammond, LPN   56/10/139   Nurse Notes: bladder leaking for the last month, seems like it is worsening - no pain, foul odor or other sx. Wants to address at her next visit, but that visit is for Oxygen cert. She refused to make additional appt for now

## 2021-02-08 NOTE — Patient Instructions (Signed)
[name] , Thank you for taking time to come for your Medicare Wellness Visit. I appreciate your ongoing commitment to your health goals. Please review the following plan we discussed and let me know if I can assist you in the future.   Screening recommendations/referrals: Colonoscopy: Done 02/09/2020 - Repeat in 5 years Mammogram: Due. Ordered today Bone Density: Due at age 45 Recommended yearly ophthalmology/optometry visit for glaucoma screening and checkup Recommended yearly dental visit for hygiene and checkup  Vaccinations: Declines all vaccines at this time Influenza vaccine: Due Pneumococcal vaccine: Due Tdap vaccine: Due Shingles vaccine: Due  Covid-19: Due  Advanced directives: Advance directive discussed with you today. Even though you declined this today, please call our office should you change your mind, and we can give you the proper paperwork for you to fill out.   Conditions/risks identified: Aim for 30 minutes of exercise or brisk walking each day, drink 6-8 glasses of water and eat lots of fruits and vegetables.   If you wish to quit smoking, help is available. For free tobacco cessation program offerings call the [facility] at 8608492432 or Live Well Line at 725-027-6733. You may also visit [email or web address] or email [email or web address] .[email or web address] for more information on other programs.   You may also call 1-800-QUIT-NOW 475 257 8567) or visit www.V[employer]Crisis.dk or www.BecomeAnEx.org for additional resources on smoking cessation.    Next appointment: Follow up in one year for your annual wellness visit.   Preventive Care 40-64 Years, Female Preventive care refers to lifestyle choices and visits with your health care provider that can promote health and wellness. What does preventive care include? A yearly physical exam. This is also called an annual well check. Dental exams once or twice a year. Routine eye exams. Ask your health care  provider how often you should have your eyes checked. Personal lifestyle choices, including: Daily care of your teeth and gums. Regular physical activity. Eating a healthy diet. Avoiding tobacco and drug use. Limiting alcohol use. Practicing safe sex. Taking low-dose aspirin daily starting at age 82. Taking vitamin and mineral supplements as recommended by your health care provider. What happens during an annual well check? The services and screenings done by your health care provider during your annual well check will depend on your age, overall health, lifestyle risk factors, and family history of disease. Counseling  Your health care provider may ask you questions about your: Alcohol use. Tobacco use. Drug use. Emotional well-being. Home and relationship well-being. Sexual activity. Eating habits. Work and work Statistician. Method of birth control. Menstrual cycle. Pregnancy history. Screening  You may have the following tests or measurements: Height, weight, and BMI. Blood pressure. Lipid and cholesterol levels. These may be checked every 5 years, or more frequently if you are over 71 years old. Skin check. Lung cancer screening. You may have this screening every year starting at age 15 if you have a 30-pack-year history of smoking and currently smoke or have quit within the past 15 years. Fecal occult blood test (FOBT) of the stool. You may have this test every year starting at age 7. Flexible sigmoidoscopy or colonoscopy. You may have a sigmoidoscopy every 5 years or a colonoscopy every 10 years starting at age 3. Hepatitis C blood test. Hepatitis B blood test. Sexually transmitted disease (STD) testing. Diabetes screening. This is done by checking your blood sugar (glucose) after you have not eaten for a while (fasting). You may have this done every 1-3 years. Mammogram. This may  be done every 1-2 years. Talk to your health care provider about when you should start  having regular mammograms. This may depend on whether you have a family history of breast cancer. BRCA-related cancer screening. This may be done if you have a family history of breast, ovarian, tubal, or peritoneal cancers. Pelvic exam and Pap test. This may be done every 3 years starting at age 55. Starting at age 80, this may be done every 5 years if you have a Pap test in combination with an HPV test. Bone density scan. This is done to screen for osteoporosis. You may have this scan if you are at high risk for osteoporosis. Discuss your test results, treatment options, and if necessary, the need for more tests with your health care provider. Vaccines  Your health care provider may recommend certain vaccines, such as: Influenza vaccine. This is recommended every year. Tetanus, diphtheria, and acellular pertussis (Tdap, Td) vaccine. You may need a Td booster every 10 years. Zoster vaccine. You may need this after age 46. Pneumococcal 13-valent conjugate (PCV13) vaccine. You may need this if you have certain conditions and were not previously vaccinated. Pneumococcal polysaccharide (PPSV23) vaccine. You may need one or two doses if you smoke cigarettes or if you have certain conditions. Talk to your health care provider about which screenings and vaccines you need and how often you need them. This information is not intended to replace advice given to you by your health care provider. Make sure you discuss any questions you have with your health care provider. Document Released: 04/16/2015 Document Revised: 12/08/2015 Document Reviewed: 01/19/2015 Elsevier Interactive Patient Education  2017 [place] Prevention in the Home Falls can cause injuries. They can happen to people of all ages. There are many things you can do to make your home safe and to help prevent falls. What can I do on the outside of my home? Regularly fix the edges of walkways and driveways and fix any  cracks. Remove anything that might make you trip as you walk through a door, such as a raised step or threshold. Trim any bushes or trees on the path to your home. Use bright outdoor lighting. Clear any walking paths of anything that might make someone trip, such as rocks or tools. Regularly check to see if handrails are loose or broken. Make sure that both sides of any steps have handrails. Any raised decks and porches should have guardrails on the edges. Have any leaves, snow, or ice cleared regularly. Use sand or salt on walking paths during winter. Clean up any spills in your garage right away. This includes oil or grease spills. What can I do in the bathroom? Use night lights. Install grab bars by the toilet and in the tub and shower. Do not use towel bars as grab bars. Use non-skid mats or decals in the tub or shower. If you need to sit down in the shower, use a plastic, non-slip stool. Keep the floor dry. Clean up any water that spills on the floor as soon as it happens. Remove soap buildup in the tub or shower regularly. Attach bath mats securely with double-sided non-slip rug tape. Do not have throw rugs and other things on the floor that can make you trip. What can I do in the bedroom? Use night lights. Make sure that you have a light by your bed that is easy to reach. Do not use any sheets or blankets that are too big  for your bed. They should not hang down onto the floor. Have a firm chair that has side arms. You can use this for support while you get dressed. Do not have throw rugs and other things on the floor that can make you trip. What can I do in the kitchen? Clean up any spills right away. Avoid walking on wet floors. Keep items that you use a lot in easy-to-reach places. If you need to reach something above you, use a strong step stool that has a grab bar. Keep electrical cords out of the way. Do not use floor polish or wax that makes floors slippery. If you must  use wax, use non-skid floor wax. Do not have throw rugs and other things on the floor that can make you trip. What can I do with my stairs? Do not leave any items on the stairs. Make sure that there are handrails on both sides of the stairs and use them. Fix handrails that are broken or loose. Make sure that handrails are as long as the stairways. Check any carpeting to make sure that it is firmly attached to the stairs. Fix any carpet that is loose or worn. Avoid having throw rugs at the top or bottom of the stairs. If you do have throw rugs, attach them to the floor with carpet tape. Make sure that you have a light switch at the top of the stairs and the bottom of the stairs. If you do not have them, ask someone to add them for you. What else can I do to help prevent falls? Wear shoes that: Do not have high heels. Have rubber bottoms. Are comfortable and fit you well. Are closed at the toe. Do not wear sandals. If you use a stepladder: Make sure that it is fully opened. Do not climb a closed stepladder. Make sure that both sides of the stepladder are locked into place. Ask someone to hold it for you, if possible. Clearly mark and make sure that you can see: Any grab bars or handrails. First and last steps. Where the edge of each step is. Use tools that help you move around (mobility aids) if they are needed. These include: Canes. Walkers. Scooters. Crutches. Turn on the lights when you go into a dark area. Replace any light bulbs as soon as they burn out. Set up your furniture so you have a clear path. Avoid moving your furniture around. If any of your floors are uneven, fix them. If there are any pets around you, be aware of where they are. Review your medicines with your doctor. Some medicines can make you feel dizzy. This can increase your chance of falling. Ask your doctor what other things that you can do to help prevent falls. This information is not intended to replace  advice given to you by your health care provider. Make sure you discuss any questions you have with your health care provider. Document Released: 01/14/2009 Document Revised: 08/26/2015 Document Reviewed: 04/24/2014 Elsevier Interactive Patient Education  2017 [employer].

## 2021-02-10 NOTE — Progress Notes (Signed)
Virtual Visit via Telephone Note  I connected with  [name] on 02/08/2021 at  9:00 AM EST by telephone and verified that I am speaking with the correct person using two identifiers.  Location: Patient: Home Provider: [facility] Persons participating in the virtual visit: patient/Nurse Health Advisor   I discussed the limitations, risks, security and privacy concerns of performing an evaluation and management service by telephone and the availability of in person appointments. The patient expressed understanding and agreed to proceed.  Interactive audio and video telecommunications were attempted between this nurse and patient, however failed, due to patient having technical difficulties OR patient did not have access to video capability.  We continued and completed visit with audio only.  Some vital signs may be absent or patient reported.   [name] Dionne Ano, LPN

## 2021-02-14 ENCOUNTER — Encounter: Payer: Self-pay | Admitting: Nurse Practitioner

## 2021-02-14 ENCOUNTER — Other Ambulatory Visit: Payer: Self-pay

## 2021-02-14 ENCOUNTER — Ambulatory Visit (INDEPENDENT_AMBULATORY_CARE_PROVIDER_SITE_OTHER): Payer: Medicare Other | Admitting: Nurse Practitioner

## 2021-02-14 VITALS — BP 115/79 | HR 90 | Temp 97.4°F | Resp 20 | Ht 60.0 in | Wt 143.0 lb

## 2021-02-14 DIAGNOSIS — Z9981 Dependence on supplemental oxygen: Secondary | ICD-10-CM | POA: Diagnosis not present

## 2021-02-14 DIAGNOSIS — J40 Bronchitis, not specified as acute or chronic: Secondary | ICD-10-CM | POA: Diagnosis not present

## 2021-02-14 DIAGNOSIS — L239 Allergic contact dermatitis, unspecified cause: Secondary | ICD-10-CM

## 2021-02-14 MED ORDER — LEVOFLOXACIN 500 MG PO TABS: 500.0000 mg | ORAL_TABLET | Freq: Every day | ORAL | 0 refills | Status: DC

## 2021-02-14 NOTE — Progress Notes (Signed)
   Subjective:    Patient ID: [name], female    DOB: [birth year], 45 y.o.   MRN: [id number]  Chief Complaint: rash on back of neck, chest congestion, and Recertification for Oxygen   HPI Patient comes in today with several complaints: - Rash on back of neck-she just noticed t last nigh. Is itchy and has been spreading.denies any new soaps or detergents. - chest congestion. Coughing up phlegm. Cough feels tigt. Started 3-4 days ago. She is smoking again. - is on oxygen at home only at night. Patient was told that she needs to re certify. She uses mainly at night because of drops in oxygen saturation. She sleeps through the night when she wears her oxygen. Otherwise she wake sup several times at night and doe snot feel rested the next day. She uses oxygen via nasal canula at 2L.    Review of Systems  Constitutional:  Negative for chills, fatigue and fever.  Respiratory:  Positive for cough, chest tightness (when coughing) and wheezing. Negative for stridor.       Objective:   Physical Exam Vitals reviewed.  Constitutional:      Appearance: Normal appearance.  Cardiovascular:     Rate and Rhythm: Normal rate and regular rhythm.     Heart sounds: Normal heart sounds.  Pulmonary:     Effort: Pulmonary effort is normal.     Breath sounds: Wheezing (exp wheezes) present.     Comments: Deep wet cough Abdominal:     Palpations: Abdomen is soft.  Skin:    Findings: Rash: erythematous macular rash on back of neck.  Neurological:     General: No focal deficit present.     Mental Status: She is alert.  Psychiatric:        Mood and Affect: Mood normal.        Behavior: Behavior normal.    BP 115/79   Pulse 90   Temp (!) 97.4 F (36.3 C) (Temporal)   Resp 20   Ht 5' (1.524 m)   Wt 143 lb (64.9 kg)   SpO2 90%   BMI 27.93 kg/m   Oxygen sat  RA at rest 90%                      RA exercising- 86%           O2 exercising 97%     Assessment & Plan:  [name]  in today with chief complaint of rash on back of neck, chest congestion, and Recertification for Oxygen   1. Bronchitis Rest Froce fluids STOP SMOKING - levofloxacin (LEVAQUIN) 500 MG tablet; Take 1 tablet (500 mg total) by mouth daily.  Dispense: 7 tablet; Refill: 0  2. Allergic dermatitis Steroid cream OTC Avoid scratching  3. On home O2 therapy Recertifiied.  The above assessment and management plan was discussed with the patient. The patient verbalized understanding of and has agreed to the management plan. Patient is aware to call the clinic if symptoms persist or worsen. Patient is aware when to return to the clinic for a follow-up visit. Patient educated on when it is appropriate to go to the emergency department.   [name], FNP

## 2021-02-14 NOTE — Patient Instructions (Signed)
Home Oxygen Use, Adult When a medical condition keeps you from getting enough oxygen, your health care provider may instruct you to take extra oxygen at home. Your health care provider will let you know: When to take oxygen. How long to take oxygen. How quickly oxygen should be delivered (flow rate), in liters per minute (LPM or L/M). Home oxygen can be given through: A mask. A nasal cannula. This is a device or tube that goes in the nostrils. A transtracheal catheter. This is a small, thin tube placed in the windpipe (trachea). A breathing tube (tracheostomy tube) that is surgically placed in the windpipe. This may be used in severe cases. These devices are connected with tubing to an oxygen source, such as: A tank. Tanks hold oxygen in gas form. They must be replaced when the oxygen is used up. A liquid oxygen device. This holds oxygen in liquid form. Liquid oxygen is very cold. It must be replaced when the oxygen is used up. An oxygen concentrator machine. This filters oxygen in the room. There are two types of oxygen concentrator machines--stationary and portable. A stationary oxygen concentrator machine plugs into the main electricity supply at your home. You must have a backup cylinder of oxygen in case the power goes out. A portable oxygen concentrator machine is smaller in size and more lightweight. This machine uses battery supply and can be used outside the home. Work with your health care provider to find equipment that works best for youand your lifestyle. What are the risks? Delivery of supplemental oxygen is generally safe. However, some risks include: Fire. This can happen if the oxygen is exposed to a heat source, flame, or spark. Injury to skin. This can happen if liquid oxygen touches your skin. Damage to the lungs or other organs. This can happen from getting too little or too much oxygen. Supplies needed: To use oxygen, you will need: A mask, nasal cannula, transtracheal  catheter, or tracheostomy. An oxygen tank, a liquid oxygen device, or an oxygen concentrator. The tape that your health care provider recommends (optional). Your health care provider may also recommend: A humidifier to warm and moisten the oxygen delivered. This will depend on how much oxygen you need and the type of home oxygen device you use. A pulse oximeter. This device measures the percentage of oxygen in your blood. How to use oxygen Your health care provider or a person from your medical device company will show you how to use your oxygen device. Follow his or her instructions. The instructions may look something like this: Wash your hands with soap and water. If you use an oxygen concentrator, make sure it is plugged in. Place one end of the tube into the port on the tank, device, or machine. Place the mask over your nose and mouth. Or, place the nasal cannula and secure it with tape if instructed. If you use a tracheostomy or transtracheal catheter, connect it to the oxygen source as directed. Make sure the liter-flow setting on the machine is at the level prescribed by your health care provider. Turn on the machine or adjust the knob on the tank or device to the correct liter-flow setting. When you are done, turn off and unplug the machine, or turn the knob to OFF. How to clean and care for the oxygen supplies Nasal cannula Clean it with a warm, wet cloth daily or as needed. Wash it with a liquid soap once a week. Rinse it thoroughly once or twice a week.   Air-dry it. Replace it every 2-4 weeks. If you have an infection, such as a cold or pneumonia, change the cannula when you get better. Mask Replace it every 2-4 weeks. If you have an infection, such as a cold or pneumonia, change the mask when you get better. Humidifier bottle Wash the bottle between each refill: Wash it with soap and warm water. Rinse it thoroughly. Clean it and its top with a disinfectant cleaner. Air-dry  it. Make sure it is dry before you refill it. Oxygen concentrator Clean the air filter at least twice a week according to directions from your home medical equipment and service company. Wipe down the cabinet every day. To do this: Unplug the unit. Wipe down the cabinet with a damp cloth. Dry the cabinet. Other equipment Change any extra tubing every 1-3 months. Follow instructions from your health care provider about taking care of any other equipment. Safety tips Fire safety tips  Keep your oxygen and oxygen supplies at least 6 ft (2 m) away from sources of heat, flames, and sparks at all times. Do not allow smoking near your oxygen. Put up "no smoking" signs in your home. Avoid smoking areas when in public. Do not use materials that can burn (are flammable) while you use oxygen. This includes: Petroleum jelly. Hair spray or other aerosol sprays. Rubbing alcohol. Hand sanitizer. When you go to a restaurant with portable oxygen, ask to be seated in the non-smoking section. Keep a fire extinguisher close by. Let your fire department know that you have oxygen in your home. Test your home smoke detectors regularly.  Traveling Secure your oxygen tank in the vehicle so that it does not move around. Follow instructions from your medical device company about how to safely secure your tank. Make sure you have enough oxygen for the amount of time you will be away from home. If you are planning to travel by public transportation (airplane, train, bus, or boat), contact the company to find out if it allows the use of an approved portable oxygen concentrator. You may also need documents from your health care provider and medical device company before you travel. General safety tips If you use an oxygen cylinder, make sure it is in a stand or secured to an object that will not move (fixed object). If you use liquid oxygen, make sure its container is kept upright at all times. If you use an oxygen  concentrator: Tell your electric company. Make sure you are given priority service in the event that your power goes out. Avoid using extension cords if possible. Follow these instructions at home: Use oxygen only as told by your health care provider. Do not use alcohol or other drugs that make you relax (sedating drugs) unless instructed. They can slow down your breathing rate and make it hard to get in enough oxygen. Know how and when to order a refill of oxygen. Always keep a spare tank of oxygen. Plan ahead for holidays when you may not be able to get a prescription filled. Use water-based lubricants on your lips or nostrils. Do not use oil-based products like petroleum jelly. To prevent skin irritation on your cheeks or behind your ears, tuck some gauze under the tubing. Where to find more information American Lung Association: www.lung.org/oxygen Contact a health care provider if: You get headaches often. You have a lasting cough. You are restless or have anxiety. You develop an illness that affects your breathing. You cannot exercise at your regular level. You   have a fever. You have persistent redness under your nose. Get help right away if: You are confused. You are sleepy all the time. You have blue lips or fingernails. You have difficult or irregular breathing that is getting worse. You are struggling to breathe. These symptoms may represent a serious problem that is an emergency. Do not wait to see if the symptoms will go away. Get medical help right away. Call your local emergency services (911 in the [place] Do not drive yourself to the hospital. Summary Your health care provider or a person from your medical device company will show you how to use your oxygen device. Follow his or her instructions. If you use an oxygen concentrator, make sure it is plugged in. Make sure the liter-flow setting on the machine is at the level prescribed by your health care provider. Use  oxygen only as told by your health care provider. Keep your oxygen and oxygen supplies at least 6 ft (2 m) away from sources of heat, flames, and sparks at all times. This information is not intended to replace advice given to you by your health care provider. Make sure you discuss any questions you have with your healthcare provider. Document Revised: 05/22/2019 Document Reviewed: 03/18/2019 Elsevier Patient Education  2022 [employer]  

## 2021-03-10 ENCOUNTER — Other Ambulatory Visit: Payer: Self-pay | Admitting: Nurse Practitioner

## 2021-03-10 ENCOUNTER — Other Ambulatory Visit: Payer: Self-pay | Admitting: Neurology

## 2021-03-10 DIAGNOSIS — R339 Retention of urine, unspecified: Secondary | ICD-10-CM

## 2021-03-15 ENCOUNTER — Ambulatory Visit (INDEPENDENT_AMBULATORY_CARE_PROVIDER_SITE_OTHER): Payer: Medicare Other | Admitting: Nurse Practitioner

## 2021-03-15 ENCOUNTER — Encounter: Payer: Self-pay | Admitting: Nurse Practitioner

## 2021-03-15 DIAGNOSIS — B3731 Acute candidiasis of vulva and vagina: Secondary | ICD-10-CM | POA: Diagnosis not present

## 2021-03-15 MED ORDER — FLUCONAZOLE 150 MG PO TABS: ORAL_TABLET | ORAL | 0 refills | Status: DC

## 2021-03-15 NOTE — Progress Notes (Signed)
   Virtual Visit  Note Due to COVID-19 pandemic this visit was conducted virtually. This visit type was conducted due to national recommendations for restrictions regarding the COVID-19 Pandemic (e.g. social distancing, sheltering in place) in an effort to limit this patient's exposure and mitigate transmission in our community. All issues noted in this document were discussed and addressed.  A physical exam was not performed with this format.  I connected with [name] on 03/15/21 at 8:01 by telephone and verified that I am speaking with the correct person using two identifiers. [name] is currently located at home and  no one is currently with her during visit. The provider, [name], FNP is located in their office at time of visit.  I discussed the limitations, risks, security and privacy concerns of performing an evaluation and management service by telephone and the availability of in person appointments. I also discussed with the patient that there may be a patient responsible charge related to this service. The patient expressed understanding and agreed to proceed.   History and Present Illness:  Patient states she has vaginal itching with thick discharge. She gets this very often. Last had 1 month ago.     Review of Systems  Constitutional:  Negative for diaphoresis and weight loss.  Eyes:  Negative for blurred vision, double vision and pain.  Respiratory:  Negative for shortness of breath.   Cardiovascular:  Negative for chest pain, palpitations, orthopnea and leg swelling.  Gastrointestinal:  Negative for abdominal pain.  Skin:  Negative for rash.  Neurological:  Negative for dizziness, sensory change, loss of consciousness, weakness and headaches.  Endo/Heme/Allergies:  Negative for polydipsia. Does not bruise/bleed easily.  Psychiatric/Behavioral:  Negative for memory loss. The patient does not have insomnia.   All other systems reviewed and are  negative.   Observations/Objective: Alert and oriented- answers all questions appropriately No distress   Assessment and Plan: [name] in today with chief complaint of No chief complaint on file.   1. Vaginal candidiasis No douching No bubble baths Meds ordered this encounter  Medications   fluconazole (DIFLUCAN) 150 MG tablet    Sig: 1 po q week x 4 weeks    Dispense:  4 tablet    Refill:  0    Order Specific Question:   Supervising Provider    Answer:   Caryl Pina A [[id number]]        Follow Up Instructions: prn    I discussed the assessment and treatment plan with the patient. The patient was provided an opportunity to ask questions and all were answered. The patient agreed with the plan and demonstrated an understanding of the instructions.   The patient was advised to call back or seek an in-person evaluation if the symptoms worsen or if the condition fails to improve as anticipated.  The above assessment and management plan was discussed with the patient. The patient verbalized understanding of and has agreed to the management plan. Patient is aware to call the clinic if symptoms persist or worsen. Patient is aware when to return to the clinic for a follow-up visit. Patient educated on when it is appropriate to go to the emergency department.   Time call ended:  8:10  I provided 8 minutes of  non face-to-face time during this encounter.    [name], FNP

## 2021-03-24 ENCOUNTER — Encounter: Payer: Self-pay | Admitting: Family Medicine

## 2021-03-24 ENCOUNTER — Ambulatory Visit (INDEPENDENT_AMBULATORY_CARE_PROVIDER_SITE_OTHER): Payer: Medicare Other | Admitting: Family Medicine

## 2021-03-24 DIAGNOSIS — J069 Acute upper respiratory infection, unspecified: Secondary | ICD-10-CM

## 2021-03-24 NOTE — Progress Notes (Signed)
Virtual Visit via Telephone Note  I connected with [name] on 03/24/21 at 8:38 AM by telephone and verified that I am speaking with the correct person using two identifiers. [name] is currently located at home and nobody is currently with her during this visit. The provider, Loman Brooklyn, FNP is located in their office at time of visit.  I discussed the limitations, risks, security and privacy concerns of performing an evaluation and management service by telephone and the availability of in person appointments. I also discussed with the patient that there may be a patient responsible charge related to this service. The patient expressed understanding and agreed to proceed.  Subjective: PCP: Chevis Pretty, FNP  Chief Complaint  Patient presents with   URI   Patient complains of cough, head/chest congestion, sore throat, fever, and postnasal drainage. Onset of symptoms was 2 days ago, gradually worsening since that time. She is drinking plenty of fluids. Evaluation to date: none. Treatment to date: antihistamines. She has a history of COPD. She does smoke.    ROS: Per HPI  Current Outpatient Medications:    ALPRAZolam (XANAX) 1 MG tablet, Take 1 mg by mouth 4 (four) times daily as needed., Disp: , Rfl:    baclofen (LIORESAL) 10 MG tablet, TK 1 T PO TID PRN, Disp: , Rfl:    Calcium Polycarbophil (FIBER-CAPS PO), Take 1 capsule by mouth daily. , Disp: , Rfl:    cetirizine (ZYRTEC ALLERGY) 10 MG tablet, Take 1 tablet (10 mg total) by mouth daily., Disp: 30 tablet, Rfl: 0   fluconazole (DIFLUCAN) 150 MG tablet, 1 po q week x 4 weeks, Disp: 4 tablet, Rfl: 0   fluPHENAZine (PROLIXIN) 1 MG tablet, Take 1 mg by mouth at bedtime., Disp: , Rfl:    Galcanezumab-gnlm (EMGALITY) 120 MG/ML SOAJ, Inject 120 mg into the skin every 28 (twenty-eight) days., Disp: 1.12 mL, Rfl: 5   HYDROcodone-acetaminophen (NORCO) 10-325 MG tablet, Take 1 tablet by mouth every 6 (six) hours  as needed for moderate pain (For Neck and Back Pain)., Disp: , Rfl:    levofloxacin (LEVAQUIN) 500 MG tablet, Take 1 tablet (500 mg total) by mouth daily., Disp: 7 tablet, Rfl: 0   linaclotide (LINZESS) 145 MCG CAPS capsule, Take 1 capsule (145 mcg total) by mouth daily before breakfast., Disp: 30 capsule, Rfl: 0   naloxone (NARCAN) nasal spray 4 mg/0.1 mL, , Disp: , Rfl:    omeprazole (PRILOSEC) 40 MG capsule, Take by mouth., Disp: , Rfl:    promethazine (PHENERGAN) 25 MG tablet, Take 1 tablet (25 mg total) by mouth every 6 (six) hours as needed for nausea or vomiting., Disp: 30 tablet, Rfl: 0   QUEtiapine (SEROQUEL) 400 MG tablet, Take 800 mg by mouth at bedtime., Disp: , Rfl:    rizatriptan (MAXALT) 10 MG tablet, TAKE 1 TABLET BY MOUTH AS NEEDED FOR MIGRAINE-MAY REPEAT IN 2 HRS IF NEEDED-MAX 2/24HRS, Disp: 10 tablet, Rfl: 0   sodium chloride (OCEAN) 0.65 % SOLN nasal spray, Place 1 spray into both nostrils as needed for congestion., Disp: 60 mL, Rfl: 2   tamsulosin (FLOMAX) 0.4 MG CAPS capsule, TAKE 1 CAPSULE BY MOUTH EVERY DAY, Disp: 90 capsule, Rfl: 0   vortioxetine HBr (TRINTELLIX) 20 MG TABS tablet, Take 1 tablet (20 mg total) by mouth daily., Disp: 90 tablet, Rfl: 1  Allergies  Allergen Reactions   Amoxicillin Nausea And Vomiting   Chantix [Varenicline] Other (See Comments)    Causes  bad nightmares, hallucinations   Codeine Other (See Comments)    Shaking    Divalproex Sodium Other (See Comments)    sedation   Lithium Other (See Comments)    Drowsiness, tremors drowsiness   Oxycodone-Acetaminophen Other (See Comments)   Propoxyphene Nausea And Vomiting   Other     Pt states "I cannot take steroids"   Penicillins    Augmentin [Amoxicillin-Pot Clavulanate] Diarrhea and Other (See Comments)   Prednisone     Moodiness   Zolpidem Other (See Comments)    Patient states it does not agree with her body.    Past Medical History:  Diagnosis Date   Anemia 08/19/2018   Asthma     Bipolar 1 disorder    Chronic low back pain 04/25/2017   Chronic neck pain 04/25/2017   Chronic obstructive pulmonary disease 11/28/2019   Chronic pain syndrome 03/05/2016   DDD (degenerative disc disease), cervical 05/27/2017   Dysuria 04/30/2020   Enlarged lymph node 05/30/2017   Seen on CTA- reactive vs sarcoidosis.   Essential hypertension 09/03/2015   Generalized anxiety disorder    H. pylori infection    Hot flashes not due to menopause 10/14/2012   Hypokalemia 08/19/2018   Irritable bowel syndrome without diarrhea 10/15/2014   Seen by Jamse Arn   Knee pain, bilateral 07/01/2020   Major depressive disorder 11/13/2018   Migraine headaches    Nocturnal hypoxia 08/10/2016   Pelvic and perineal pain 06/17/2012   Presence of auditory hallucinations 12/14/2019   Primary insomnia 11/28/2019   Rectal prolapse 05/15/2012   Trochanteric bursitis of right hip 07/01/2020   Tubular adenoma of colon 03/26/2016   History of advanced adenoma. Surveillance colonoscopy in 2021 negative. Repeat next surveillance colonoscopy due in 5 years (2026).    Observations/Objective: A&O  No respiratory distress or wheezing audible over the phone Mood, judgement, and thought processes all WNL  Assessment and Plan: 1. Viral URI Discussed symptom management. Patient declines testing for COVID and influenza.   Follow Up Instructions:  I discussed the assessment and treatment plan with the patient. The patient was provided an opportunity to ask questions and all were answered. The patient agreed with the plan and demonstrated an understanding of the instructions.   The patient was advised to call back or seek an in-person evaluation if the symptoms worsen or if the condition fails to improve as anticipated.  The above assessment and management plan was discussed with the patient. The patient verbalized understanding of and has agreed to the management plan. Patient is aware to call the clinic if symptoms  persist or worsen. Patient is aware when to return to the clinic for a follow-up visit. Patient educated on when it is appropriate to go to the emergency department.   Time call ended: 8:49 AM  I provided 11 minutes of non-face-to-face time during this encounter.  Hendricks Limes, MSN, APRN, FNP-C [facility] 03/24/21
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
## 2021-04-01 ENCOUNTER — Ambulatory Visit: Payer: Medicare Other | Admitting: Neurology

## 2021-05-02 ENCOUNTER — Ambulatory Visit
Admission: RE | Admit: 2021-05-02 | Discharge: 2021-05-02 | Disposition: A | Discharge status: Home / Self Care | Payer: Medicare Other | Source: Ambulatory Visit | Attending: Nurse Practitioner | Admitting: Nurse Practitioner

## 2021-05-02 DIAGNOSIS — Z1231 Encounter for screening mammogram for malignant neoplasm of breast: Secondary | ICD-10-CM

## 2021-05-31 ENCOUNTER — Ambulatory Visit (INDEPENDENT_AMBULATORY_CARE_PROVIDER_SITE_OTHER): Payer: Medicare Other | Admitting: Nurse Practitioner

## 2021-05-31 ENCOUNTER — Encounter: Payer: Self-pay | Admitting: Nurse Practitioner

## 2021-05-31 VITALS — BP 124/90 | HR 102 | Ht 60.0 in | Wt 134.0 lb

## 2021-05-31 DIAGNOSIS — M79674 Pain in right toe(s): Secondary | ICD-10-CM

## 2021-05-31 DIAGNOSIS — M79675 Pain in left toe(s): Secondary | ICD-10-CM | POA: Diagnosis not present

## 2021-05-31 NOTE — Progress Notes (Signed)
Acute Office Visit  Subjective:    Patient ID: [name], female    DOB: 28-Oct-1975, 46 y.o.   MRN: [id number]  Chief Complaint  Patient presents with   toenail pain    Toe Pain     Past Medical History:  Diagnosis Date   Anemia 08/19/2018   Asthma    Bipolar 1 disorder    Chronic low back pain 04/25/2017   Chronic neck pain 04/25/2017   Chronic obstructive pulmonary disease 11/28/2019   Chronic pain syndrome 03/05/2016   DDD (degenerative disc disease), cervical 05/27/2017   Dysuria 04/30/2020   Enlarged lymph node 05/30/2017   Seen on CTA- reactive vs sarcoidosis.   Essential hypertension 09/03/2015   Generalized anxiety disorder    H. pylori infection    Hot flashes not due to menopause 10/14/2012   Hypokalemia 08/19/2018   Irritable bowel syndrome without diarrhea 10/15/2014   Seen by Jamse Arn   Knee pain, bilateral 07/01/2020   Major depressive disorder 11/13/2018   Migraine headaches    Nocturnal hypoxia 08/10/2016   Pelvic and perineal pain 06/17/2012   Presence of auditory hallucinations 12/14/2019   Primary insomnia 11/28/2019   Rectal prolapse 05/15/2012   Trochanteric bursitis of right hip 07/01/2020   Tubular adenoma of colon 03/26/2016   History of advanced adenoma. Surveillance colonoscopy in 2021 negative. Repeat next surveillance colonoscopy due in 5 years (2026).    Past Surgical History:  Procedure Laterality Date   ABDOMINAL HYSTERECTOMY     BLADDER SURGERY  2015   CHOLECYSTECTOMY      Family History  Problem Relation Age of Onset   Renal Disease Mother    Heart attack Father    Thyroid disease Neg Hx     Social History   Socioeconomic History   Marital status: Legally Separated    Spouse name: Not on file   Number of children: 3   Years of education: 11   Highest education level: 11th grade  Occupational History   Occupation: Disability  Tobacco Use   Smoking status: Every Day    Packs/day: 1.00    Types: Cigarettes    Smokeless tobacco: Never  Vaping Use   Vaping Use: Never used  Substance and Sexual Activity   Alcohol use: No   Drug use: No   Sexual activity: Not Currently    Birth control/protection: Surgical  Other Topics Concern   Not on file  Social History Narrative   Right handed   Drinks caffeine   One story home   Social Determinants of Health   Financial Resource Strain: Low Risk    Difficulty of Paying Living Expenses: Not hard at all  Food Insecurity: No Food Insecurity   Worried About Charity fundraiser in the Last Year: Never true   Ran Out of Food in the Last Year: Never true  Transportation Needs: No Transportation Needs   Lack of Transportation (Medical): No   Lack of Transportation (Non-Medical): No  Physical Activity: Sufficiently Active   Days of Exercise per Week: 3 days   Minutes of Exercise per Session: 50 min  Stress: No Stress Concern Present   Feeling of Stress : Only a little  Social Connections: Socially Isolated   Frequency of Communication with Friends and Family: More than three times a week   Frequency of Social Gatherings with Friends and Family: More than three times a week   Attends Religious Services: Never   Marine scientist or Organizations:  No   Attends Archivist Meetings: Never   Marital Status: Separated  Intimate Partner Violence: Not At Risk   Fear of Current or Ex-Partner: No   Emotionally Abused: No   Physically Abused: No   Sexually Abused: No    Outpatient Medications Prior to Visit  Medication Sig Dispense Refill   ALPRAZolam (XANAX) 1 MG tablet Take 1 mg by mouth 4 (four) times daily as needed.     baclofen (LIORESAL) 10 MG tablet TK 1 T PO TID PRN     Calcium Polycarbophil (FIBER-CAPS PO) Take 1 capsule by mouth daily.      cetirizine (ZYRTEC ALLERGY) 10 MG tablet Take 1 tablet (10 mg total) by mouth daily. 30 tablet 0   fluconazole (DIFLUCAN) 150 MG tablet 1 po q week x 4 weeks 4 tablet 0   fluPHENAZine  (PROLIXIN) 1 MG tablet Take 1 mg by mouth at bedtime.     Galcanezumab-gnlm (EMGALITY) 120 MG/ML SOAJ Inject 120 mg into the skin every 28 (twenty-eight) days. 1.12 mL 5   HYDROcodone-acetaminophen (NORCO) 10-325 MG tablet Take 1 tablet by mouth every 6 (six) hours as needed for moderate pain (For Neck and Back Pain).     levofloxacin (LEVAQUIN) 500 MG tablet Take 1 tablet (500 mg total) by mouth daily. 7 tablet 0   linaclotide (LINZESS) 145 MCG CAPS capsule Take 1 capsule (145 mcg total) by mouth daily before breakfast. 30 capsule 0   naloxone (NARCAN) nasal spray 4 mg/0.1 mL      omeprazole (PRILOSEC) 40 MG capsule Take by mouth.     promethazine (PHENERGAN) 25 MG tablet Take 1 tablet (25 mg total) by mouth every 6 (six) hours as needed for nausea or vomiting. 30 tablet 0   QUEtiapine (SEROQUEL) 400 MG tablet Take 800 mg by mouth at bedtime.     rizatriptan (MAXALT) 10 MG tablet TAKE 1 TABLET BY MOUTH AS NEEDED FOR MIGRAINE-MAY REPEAT IN 2 HRS IF NEEDED-MAX 2/24HRS 10 tablet 0   sodium chloride (OCEAN) 0.65 % SOLN nasal spray Place 1 spray into both nostrils as needed for congestion. 60 mL 2   tamsulosin (FLOMAX) 0.4 MG CAPS capsule TAKE 1 CAPSULE BY MOUTH EVERY DAY 90 capsule 0   vortioxetine HBr (TRINTELLIX) 20 MG TABS tablet Take 1 tablet (20 mg total) by mouth daily. 90 tablet 1   No facility-administered medications prior to visit.    Allergies  Allergen Reactions   Amoxicillin Nausea And Vomiting   Chantix [Varenicline] Other (See Comments)    Causes bad nightmares, hallucinations   Codeine Other (See Comments)    Shaking    Divalproex Sodium Other (See Comments)    sedation   Lithium Other (See Comments)    Drowsiness, tremors drowsiness   Oxycodone-Acetaminophen Other (See Comments)   Propoxyphene Nausea And Vomiting   Other     Pt states "I cannot take steroids"   Penicillins    Augmentin [Amoxicillin-Pot Clavulanate] Diarrhea and Other (See Comments)   Prednisone      Moodiness   Zolpidem Other (See Comments)    Patient states it does not agree with her body.     Review of Systems  Constitutional: Negative.   HENT: Negative.    Eyes: Negative.   Respiratory: Negative.    Cardiovascular: Negative.   Musculoskeletal: Negative.   Skin:  Negative for rash.  All other systems reviewed and are negative.     Objective:    Physical Exam Vitals and  nursing note reviewed.  Constitutional:      Appearance: Normal appearance. She is normal weight.  HENT:     Head: Normocephalic.     Nose: Nose normal.  Eyes:     Conjunctiva/sclera: Conjunctivae normal.  Cardiovascular:     Rate and Rhythm: Normal rate and regular rhythm.  Pulmonary:     Effort: Pulmonary effort is normal.     Breath sounds: Normal breath sounds.  Abdominal:     General: Bowel sounds are normal.  Musculoskeletal:     Right foot: Swelling and tenderness present.     Left foot: Swelling and tenderness present.       Legs:     Comments: Ingrown toenail  Skin:    Findings: No rash.  Neurological:     Mental Status: She is alert and oriented to person, place, and time.  Psychiatric:        Behavior: Behavior normal.    BP 124/90    Pulse (!) 102    Ht 5' (1.524 m)    Wt 134 lb (60.8 kg)    SpO2 93%    BMI 26.17 kg/m  Wt Readings from Last 3 Encounters:  05/31/21 134 lb (60.8 kg)  02/14/21 143 lb (64.9 kg)  02/08/21 150 lb (68 kg)    Health Maintenance Due  Topic Date Due   COVID-19 Vaccine (1) Never done   TETANUS/TDAP  Never done   PAP SMEAR-Modifier  04/25/2021    There are no preventive care reminders to display for this patient.   Lab Results  Component Value Date   TSH 2.91 07/15/2020   Lab Results  Component Value Date   WBC 7.5 05/10/2020   HGB 14.3 05/10/2020   HCT 41.9 05/10/2020   MCV 90 05/10/2020   PLT 226 05/10/2020   Lab Results  Component Value Date   NA 144 05/10/2020   K 4.4 05/10/2020   CO2 26 05/10/2020   GLUCOSE 110 (H)  05/10/2020   BUN 9 05/10/2020   CREATININE 0.69 05/10/2020   BILITOT <0.2 05/10/2020   ALKPHOS 151 (H) 05/10/2020   AST 17 05/10/2020   ALT 11 05/10/2020   PROT 7.3 05/10/2020   ALBUMIN 4.7 05/10/2020   CALCIUM 9.7 05/10/2020   ANIONGAP 12 12/28/2019   Lab Results  Component Value Date   CHOL 222 (H) 04/25/2018   Lab Results  Component Value Date   HDL 36 (L) 04/25/2018   Lab Results  Component Value Date   LDLCALC 137 (H) 04/25/2018   Lab Results  Component Value Date   TRIG 245 (H) 04/25/2018   Lab Results  Component Value Date   CHOLHDL 6.2 (H) 04/25/2018   No results found for: HGBA1C     Assessment & Plan:  Ingrown toenail assessed/fungal toe bilateral feet Referral to podiatry completed. -Soak foot in Epsom salt -Tylenol/ibuprofen as needed for pain -Follow-up with worsening unresolved symptoms. Problem List Items Addressed This Visit   None Visit Diagnoses     Pain in toes of both feet    -  Primary   Relevant Orders   Ambulatory referral to Podiatry        No orders of the defined types were placed in this encounter.    Ivy Lynn, NP

## 2021-05-31 NOTE — Patient Instructions (Addendum)
Ingrown Toenail An ingrown toenail occurs when the corner or sides of a toenail grow into the surrounding skin. This causes discomfort and pain. The big toe is most commonly affected, but any of the toes can be affected. If an ingrown toenail is not treated, it can become infected. What are the causes? This condition may be caused by: Wearing shoes that are too small or tight. An injury, such as stubbing your toe or having your toe stepped on. Improper cutting or care of your toenails. Having nail or foot abnormalities that were present from birth (congenital abnormalities), such as having a nail that is too big for your toe. What increases the risk? The following factors may make you more likely to develop ingrown toenails: Age. Nails tend to get thicker with age, so ingrown nails are more common among older people. Cutting your toenails incorrectly, such as cutting them very short or cutting them unevenly. An ingrown toenail is more likely to get infected if you have: Diabetes. Blood flow (circulation) problems. What are the signs or symptoms? Symptoms of an ingrown toenail may include: Pain, soreness, or tenderness. Redness. Swelling. Hardening of the skin that surrounds the toenail. Signs that an ingrown toenail may be infected include: Fluid or pus. Symptoms that get worse. How is this diagnosed? Ingrown toenails may be diagnosed based on: Your symptoms and medical history. A physical exam. Labs or tests. If you have fluid or blood coming from your toenail, a sample may be collected to test for the specific type of bacteria that is causing the infection. How is this treated? Treatment depends on the severity of your symptoms. You may be able to care for your toenail at home. If you have an infection, you may be prescribed antibiotic medicines. If you have fluid or pus draining from your toenail, your health care provider may drain it. If you have trouble walking, you may be  given crutches to use. If you have a severe or infected ingrown toenail, you may need a procedure to remove part or all of the nail. Follow these instructions at home: [place] your wound every day for signs of infection, or as often as told by your health care provider. Check for: More redness, swelling, or pain. More fluid or blood. Warmth. Pus or a bad smell. Do not pick at your toenail or try to remove it yourself. Soak your foot in warm, soapy water. Do this for 20 minutes, 3 times a day, or as often as told by your health care provider. This helps to keep your toe clean and your skin soft. Wear shoes that fit well and are not too tight. Your health care provider may recommend that you wear open-toed shoes while you heal. Trim your toenails regularly and carefully. Cut your toenails straight across to prevent injury to the skin at the corners of the toenail. Do not cut your nails in a curved shape. Keep your feet clean and dry to help prevent infection. General instructions Take over-the-counter and prescription medicines only as told by your health care provider. If you were prescribed an antibiotic, take it as told by your health care provider. Do not stop taking the antibiotic even if you start to feel better. If your health care provider told you to use crutches to help you move around, use them as instructed. Return to your normal activities as told by your health care provider. Ask your health care provider what activities are safe for you. Keep  all follow-up visits. This is important. Contact a health care provider if: You have more redness, swelling, pain, or other symptoms that do not improve with treatment. You have fluid, blood, or pus coming from your toenail. You have a red streak on your skin that starts at your foot and spreads up your leg. You have a fever. Summary An ingrown toenail occurs when the corner or sides of a toenail grow into the surrounding skin.  This causes discomfort and pain. The big toe is most commonly affected, but any of the toes can be affected. If an ingrown toenail is not treated, it can become infected. Fluid or pus draining from your toenail is a sign of infection. Your health care provider may need to drain it. You may be given antibiotics to treat the infection. Trimming your toenails regularly and properly can help you prevent an ingrown toenail. This information is not intended to replace advice given to you by your health care provider. Make sure you discuss any questions you have with your health care provider. Document Revised: 07/20/2020 Document Reviewed: 07/20/2020 Elsevier Patient Education  [place]. Onychomycosis/Fungal Toenails  WHAT IS IT? An infection that lies within the keratin of your nail plate that is caused by a fungus.  WHY ME? Fungal infections affect all ages, sexes, races, and creeds.  There may be many factors that predispose you to a fungal infection such as age, coexisting medical conditions such as diabetes, or an autoimmune disease; stress, medications, fatigue, genetics, etc.  Bottom line: fungus thrives in a warm, moist environment and your shoes offer such a location.  IS IT CONTAGIOUS? Theoretically, yes.  You do not want to share shoes, nail clippers or files with someone who has fungal toenails.  Walking around barefoot in the same room or sleeping in the same bed is unlikely to transfer the organism.  It is important to realize, however, that fungus can spread easily from one nail to the next on the same foot.  HOW DO WE TREAT THIS?  There are several ways to treat this condition.  Treatment may depend on many factors such as age, medications, pregnancy, liver and kidney conditions, etc.  It is best to ask your doctor which options are available to you.  No treatment.   Unlike many other medical concerns, you can live with this condition.  However for many people this can be a  painful condition and may lead to ingrown toenails or a bacterial infection.  It is recommended that you keep the nails cut short to help reduce the amount of fungal nail. Topical treatment.  These range from herbal remedies to prescription strength nail lacquers.  About 40-50% effective, topicals require twice daily application for approximately 9 to 12 months or until an entirely new nail has grown out.  The most effective topicals are medical grade medications available through physicians offices. Oral antifungal medications.  With an 80-90% cure rate, the most common oral medication requires 3 to 4 months of therapy and stays in your system for a year as the new nail grows out.  Oral antifungal medications do require blood work to make sure it is a safe drug for you.  A liver function panel will be performed prior to starting the medication and after the first month of treatment.  It is important to have the blood work performed to avoid any harmful side effects.  In general, this medication safe but blood work is required. Laser Therapy.  This  treatment is performed by applying a specialized laser to the affected nail plate.  This therapy is noninvasive, fast, and non-painful.  It is not covered by insurance and is therefore, out of pocket.  The results have been very good with a 80-95% cure rate.  The [facility] is the only practice in the area to offer this therapy. Permanent Nail Avulsion.  Removing the entire nail so that a new nail will not grow back.

## 2021-07-17 DIAGNOSIS — J449 Chronic obstructive pulmonary disease, unspecified: Secondary | ICD-10-CM | POA: Diagnosis not present

## 2021-07-19 DIAGNOSIS — M5412 Radiculopathy, cervical region: Secondary | ICD-10-CM | POA: Diagnosis not present

## 2021-07-26 DIAGNOSIS — R1032 Left lower quadrant pain: Secondary | ICD-10-CM | POA: Diagnosis not present

## 2021-07-26 DIAGNOSIS — R109 Unspecified abdominal pain: Secondary | ICD-10-CM | POA: Diagnosis not present

## 2021-07-26 DIAGNOSIS — J984 Other disorders of lung: Secondary | ICD-10-CM | POA: Diagnosis not present

## 2021-07-26 DIAGNOSIS — M542 Cervicalgia: Secondary | ICD-10-CM | POA: Diagnosis not present

## 2021-07-26 DIAGNOSIS — M546 Pain in thoracic spine: Secondary | ICD-10-CM | POA: Diagnosis not present

## 2021-07-26 DIAGNOSIS — I2109 ST elevation (STEMI) myocardial infarction involving other coronary artery of anterior wall: Secondary | ICD-10-CM | POA: Diagnosis not present

## 2021-07-26 DIAGNOSIS — R0902 Hypoxemia: Secondary | ICD-10-CM | POA: Diagnosis not present

## 2021-07-26 DIAGNOSIS — S0990XA Unspecified injury of head, initial encounter: Secondary | ICD-10-CM | POA: Diagnosis not present

## 2021-07-26 DIAGNOSIS — R1030 Lower abdominal pain, unspecified: Secondary | ICD-10-CM | POA: Diagnosis not present

## 2021-07-26 DIAGNOSIS — R079 Chest pain, unspecified: Secondary | ICD-10-CM | POA: Diagnosis not present

## 2021-07-26 DIAGNOSIS — R9431 Abnormal electrocardiogram [ECG] [EKG]: Secondary | ICD-10-CM | POA: Diagnosis not present

## 2021-07-26 DIAGNOSIS — S161XXA Strain of muscle, fascia and tendon at neck level, initial encounter: Secondary | ICD-10-CM | POA: Diagnosis not present

## 2021-07-26 DIAGNOSIS — S29019A Strain of muscle and tendon of unspecified wall of thorax, initial encounter: Secondary | ICD-10-CM | POA: Diagnosis not present

## 2021-07-26 DIAGNOSIS — Z79899 Other long term (current) drug therapy: Secondary | ICD-10-CM | POA: Diagnosis not present

## 2021-07-26 DIAGNOSIS — R519 Headache, unspecified: Secondary | ICD-10-CM | POA: Diagnosis not present

## 2021-07-26 DIAGNOSIS — S199XXA Unspecified injury of neck, initial encounter: Secondary | ICD-10-CM | POA: Diagnosis not present

## 2021-07-26 DIAGNOSIS — F17210 Nicotine dependence, cigarettes, uncomplicated: Secondary | ICD-10-CM | POA: Diagnosis not present

## 2021-07-26 DIAGNOSIS — I959 Hypotension, unspecified: Secondary | ICD-10-CM | POA: Diagnosis not present

## 2021-07-26 DIAGNOSIS — Z9049 Acquired absence of other specified parts of digestive tract: Secondary | ICD-10-CM | POA: Diagnosis not present

## 2021-07-26 DIAGNOSIS — F32A Depression, unspecified: Secondary | ICD-10-CM | POA: Diagnosis not present

## 2021-07-26 DIAGNOSIS — S29012A Strain of muscle and tendon of back wall of thorax, initial encounter: Secondary | ICD-10-CM | POA: Diagnosis not present

## 2021-07-26 DIAGNOSIS — M549 Dorsalgia, unspecified: Secondary | ICD-10-CM | POA: Diagnosis not present

## 2021-07-26 DIAGNOSIS — I10 Essential (primary) hypertension: Secondary | ICD-10-CM | POA: Diagnosis not present

## 2021-07-26 DIAGNOSIS — F17200 Nicotine dependence, unspecified, uncomplicated: Secondary | ICD-10-CM | POA: Diagnosis not present

## 2021-07-26 DIAGNOSIS — I3139 Other pericardial effusion (noninflammatory): Secondary | ICD-10-CM | POA: Diagnosis not present

## 2021-07-27 DIAGNOSIS — I3139 Other pericardial effusion (noninflammatory): Secondary | ICD-10-CM | POA: Diagnosis not present

## 2021-07-27 DIAGNOSIS — M546 Pain in thoracic spine: Secondary | ICD-10-CM | POA: Diagnosis not present

## 2021-07-27 DIAGNOSIS — Z720 Tobacco use: Secondary | ICD-10-CM | POA: Diagnosis not present

## 2021-07-27 DIAGNOSIS — R109 Unspecified abdominal pain: Secondary | ICD-10-CM | POA: Diagnosis not present

## 2021-07-27 DIAGNOSIS — I10 Essential (primary) hypertension: Secondary | ICD-10-CM | POA: Diagnosis not present

## 2021-07-27 DIAGNOSIS — R079 Chest pain, unspecified: Secondary | ICD-10-CM | POA: Diagnosis not present

## 2021-08-04 ENCOUNTER — Ambulatory Visit (INDEPENDENT_AMBULATORY_CARE_PROVIDER_SITE_OTHER): Payer: Medicare Other | Admitting: Nurse Practitioner

## 2021-08-04 ENCOUNTER — Encounter: Payer: Self-pay | Admitting: Nurse Practitioner

## 2021-08-04 VITALS — BP 127/83 | HR 95 | Temp 97.8°F | Resp 20 | Ht 60.0 in | Wt 130.0 lb

## 2021-08-04 DIAGNOSIS — M545 Low back pain, unspecified: Secondary | ICD-10-CM

## 2021-08-04 DIAGNOSIS — G43809 Other migraine, not intractable, without status migrainosus: Secondary | ICD-10-CM

## 2021-08-04 DIAGNOSIS — M542 Cervicalgia: Secondary | ICD-10-CM

## 2021-08-04 MED ORDER — RIZATRIPTAN BENZOATE 10 MG PO TABS: ORAL_TABLET | ORAL | 0 refills | Status: DC
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
## 2021-08-16 DIAGNOSIS — J449 Chronic obstructive pulmonary disease, unspecified: Secondary | ICD-10-CM | POA: Diagnosis not present

## 2021-09-16 DIAGNOSIS — J449 Chronic obstructive pulmonary disease, unspecified: Secondary | ICD-10-CM | POA: Diagnosis not present

## 2021-10-16 DIAGNOSIS — J449 Chronic obstructive pulmonary disease, unspecified: Secondary | ICD-10-CM | POA: Diagnosis not present

## 2021-11-16 DIAGNOSIS — J449 Chronic obstructive pulmonary disease, unspecified: Secondary | ICD-10-CM | POA: Diagnosis not present

## 2021-11-18 DIAGNOSIS — H5213 Myopia, bilateral: Secondary | ICD-10-CM | POA: Diagnosis not present

## 2021-11-22 ENCOUNTER — Telehealth: Payer: Self-pay | Admitting: Nurse Practitioner

## 2021-11-22 NOTE — Telephone Encounter (Signed)
[facility] for podiatry referral- other than Dr. Irving Shows

## 2021-11-24 ENCOUNTER — Other Ambulatory Visit: Payer: Self-pay | Admitting: Nurse Practitioner

## 2021-11-24 DIAGNOSIS — L600 Ingrowing nail: Secondary | ICD-10-CM

## 2021-11-24 NOTE — Progress Notes (Signed)
ef

## 2021-12-06 DIAGNOSIS — G894 Chronic pain syndrome: Secondary | ICD-10-CM | POA: Diagnosis not present

## 2021-12-06 DIAGNOSIS — M5412 Radiculopathy, cervical region: Secondary | ICD-10-CM | POA: Diagnosis not present

## 2021-12-06 DIAGNOSIS — M5416 Radiculopathy, lumbar region: Secondary | ICD-10-CM | POA: Diagnosis not present

## 2021-12-06 DIAGNOSIS — Z5181 Encounter for therapeutic drug level monitoring: Secondary | ICD-10-CM | POA: Diagnosis not present

## 2021-12-06 DIAGNOSIS — M5459 Other low back pain: Secondary | ICD-10-CM | POA: Diagnosis not present

## 2021-12-07 ENCOUNTER — Ambulatory Visit (INDEPENDENT_AMBULATORY_CARE_PROVIDER_SITE_OTHER): Payer: Medicare Other | Admitting: Podiatry

## 2021-12-07 ENCOUNTER — Encounter: Payer: Self-pay | Admitting: Podiatry

## 2021-12-07 DIAGNOSIS — L603 Nail dystrophy: Secondary | ICD-10-CM

## 2021-12-07 DIAGNOSIS — B351 Tinea unguium: Secondary | ICD-10-CM | POA: Diagnosis not present

## 2021-12-07 DIAGNOSIS — M79674 Pain in right toe(s): Secondary | ICD-10-CM | POA: Diagnosis not present

## 2021-12-07 DIAGNOSIS — L600 Ingrowing nail: Secondary | ICD-10-CM | POA: Diagnosis not present

## 2021-12-07 DIAGNOSIS — M79675 Pain in left toe(s): Secondary | ICD-10-CM | POA: Diagnosis not present

## 2021-12-07 MED ORDER — CICLOPIROX 8 % EX SOLN: Freq: Every day | CUTANEOUS | 0 refills | Status: DC

## 2021-12-07 NOTE — Progress Notes (Signed)
  Subjective:  Patient ID: [name], female    DOB: May 24, 1975,  MRN: [id number]  Chief Complaint  Patient presents with   Ingrown Toenail    RM 4  Bilateral hallux ingrown. Pt had pincer nails for over 10+ years. Pt requesting nail removal but she is scared to have procedure today.Wants to think about scheduling the procedure.     46 y.o. female presents with the above complaint. History confirmed with patient.  Patient presenting with painful hallux nails on the right and left great toe.  These been present for many years.  The nails are curving on either side and causing pain as they take into the toe.  She denies any redness swelling or drainage from either toe but does have pain as a have gotten longer.  She is also having difficulty trimming them due to thickness.  Does note some discoloration.  The nails have gotten long to the point where they have rubbed a hole in the toe of her shoe.  Objective:  Physical Exam: warm, good capillary refill, nail exam onychomycosis of the toenails, ingrown nail at medial and lateral border of the right and left great toenail, and dystrophic nails, no trophic changes or ulcerative lesions.  Right and left hallux nails tender to palpation related to significant incurvation of both nails.  Her nail DP pulses palpable, PT pulses palpable, and protective sensation intact Left Foot:  Pain with palpation of the hallux nail   Right Foot: normal exam, no swelling, tenderness, instability; ligaments intact, full range of motion of all ankle/foot joints and Pain with palpation of the hallux nail.    No images are attached to the encounter.  Assessment:   1. Pain due to onychomycosis of toenails of both feet   2. Ingrown nail of great toe   3. Nail dystrophy      Plan:  Patient was evaluated and treated and all questions answered.   Ingrown Nail, bilaterally - Discussed cause of nail ingrown as well as treatment options including removal or  conservative care -Patient elects to defer removal ingrown toenail today. - Performed debridement of nails 1-5 bilateral feet with aggressive slant back of medial and lateral border of the bilateral hallux nails. This was tolerated well by pt and she denies pain at the area after this.  - Will monitor ingrown nails and discuss permanent nail border removal in the future if desired  Onychomycosis - Discussed cause and treatment options for nail fungus - Will proceed with penlac 8% topical solution for all nails daily.    Return in about 3 months (around 03/08/2022) for F/u ingrown hallux nails, nail fungus.

## 2022-01-03 DIAGNOSIS — R300 Dysuria: Secondary | ICD-10-CM | POA: Diagnosis not present

## 2022-01-03 DIAGNOSIS — R350 Frequency of micturition: Secondary | ICD-10-CM | POA: Diagnosis not present

## 2022-01-22 IMAGING — CT CT HEAD W/O CM
4 series · 16 of 47 positions shown, 18 images · non-contrast
Comparison: June 24, 2019

CLINICAL DATA: Left-sided headache

EXAM:
CT HEAD WITHOUT CONTRAST
TECHNIQUE: Contiguous axial images were obtained from the base of the skull
through the vertex without intravenous contrast.

[Series 3: head wo · axial · 0.41mm/px · z∈[-110,+10]mm · 7 of 33 slices shown, 9 images]
[im 5/33  brain]
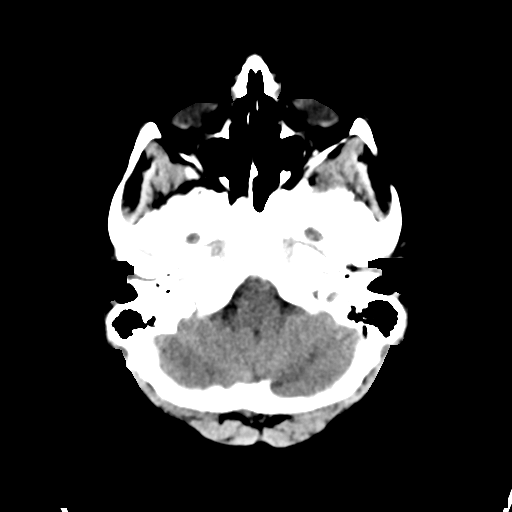
[im 5/33  bone]
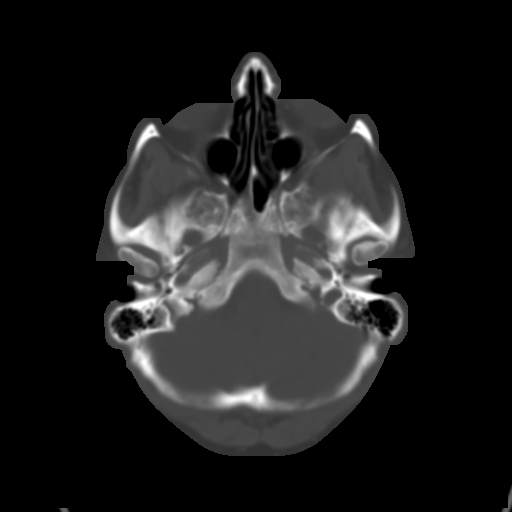
[im 9/33  brain]
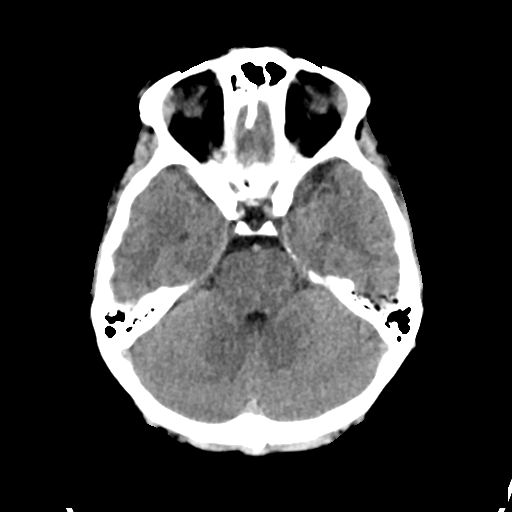
[im 13/33  brain]
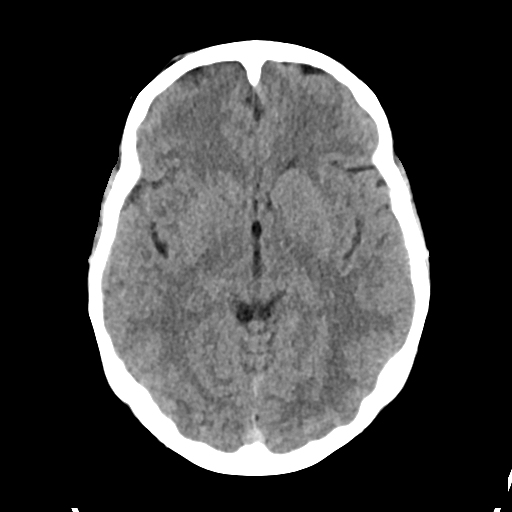
[im 17/33  brain]
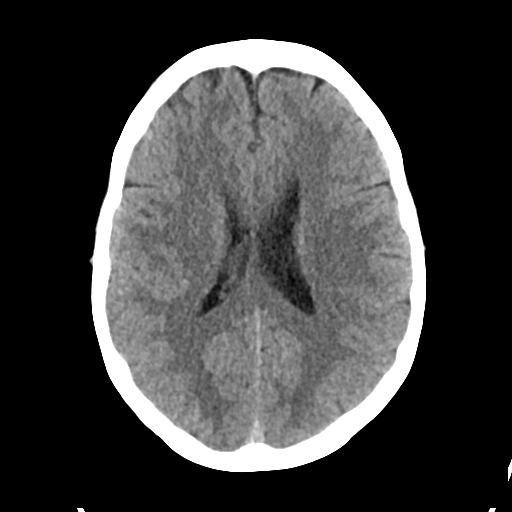
[im 21/33  brain]
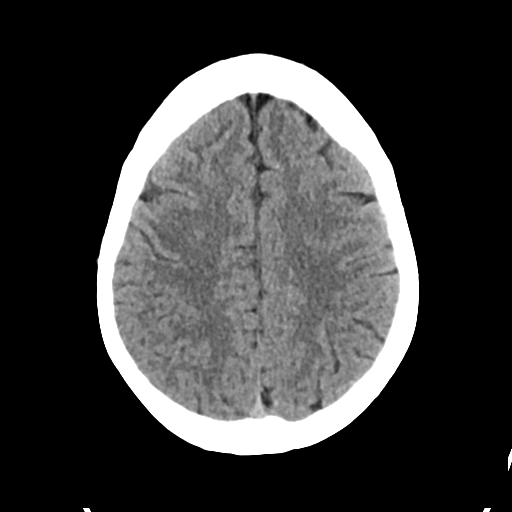
[im 21/33  bone]
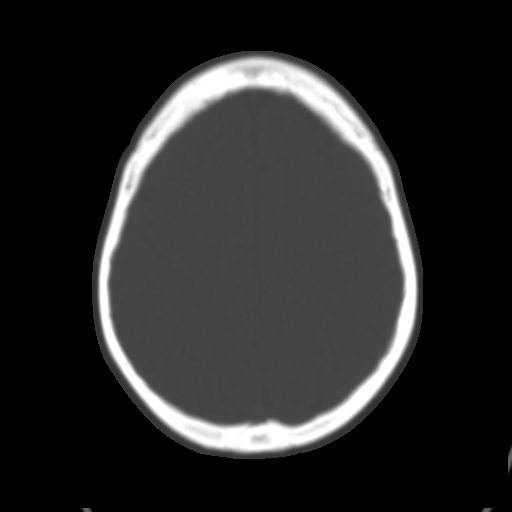
[im 25/33  brain]
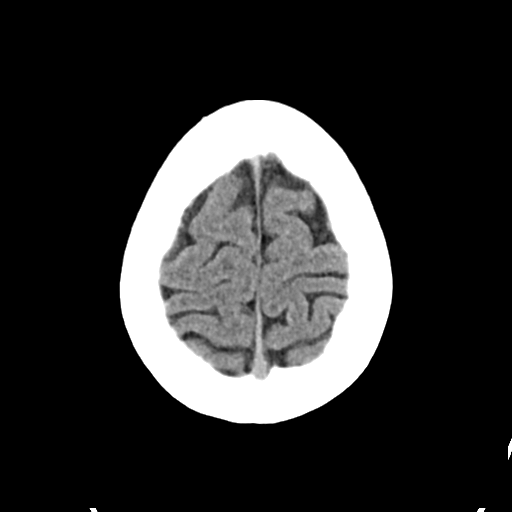
[im 29/33  brain]
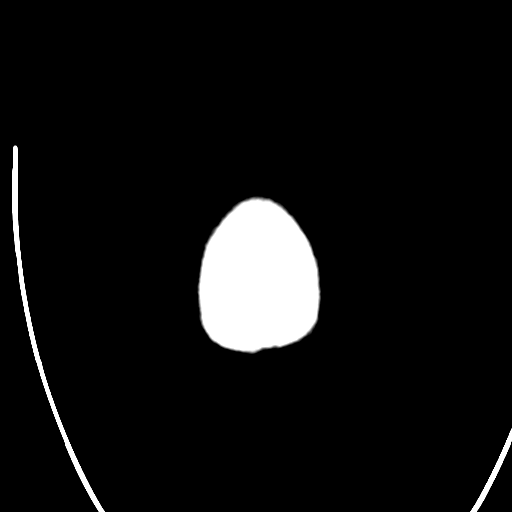

[Series 4: head bone · axial · 0.41mm/px · z∈[-114,-82]mm · 3 of 81 slices shown]
[im 9/81  bone]
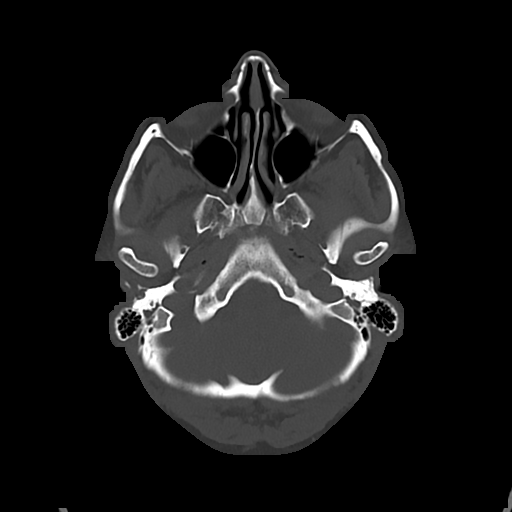
[im 17/81  bone]
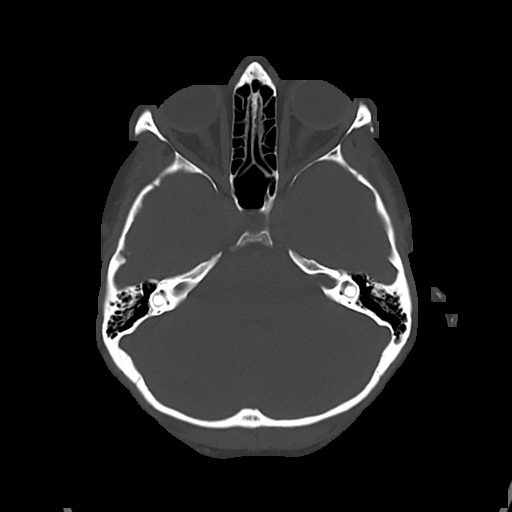
[im 25/81  bone]
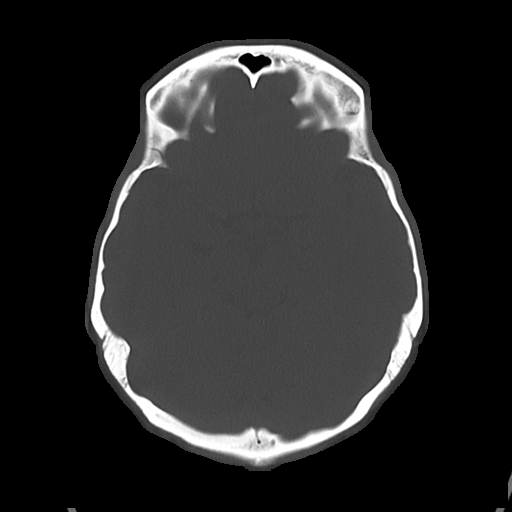

[Series 5: cor soft · coronal · 0.32mm/px · 3 of 68 slices shown]
[im 23/68  brain]
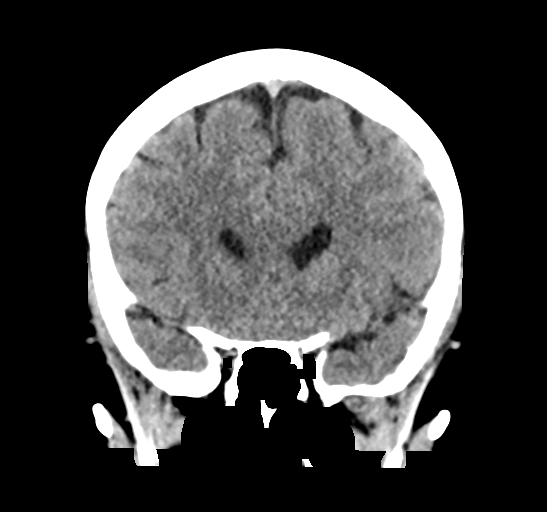
[im 30/68  brain]
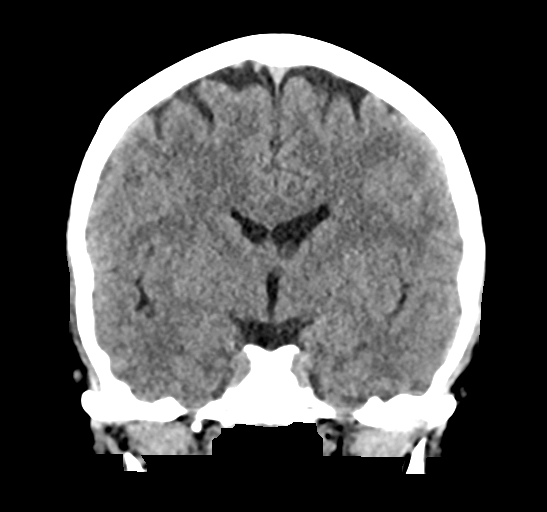
[im 38/68  brain]
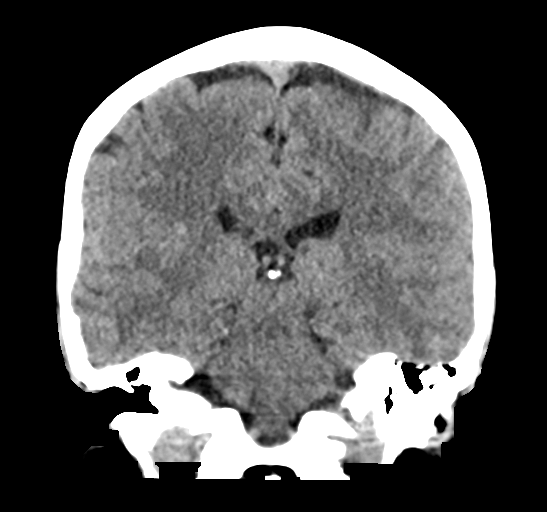

[Series 6: sag soft · sagittal · 0.32mm/px · 3 of 58 slices shown]
[im 20/58  brain]
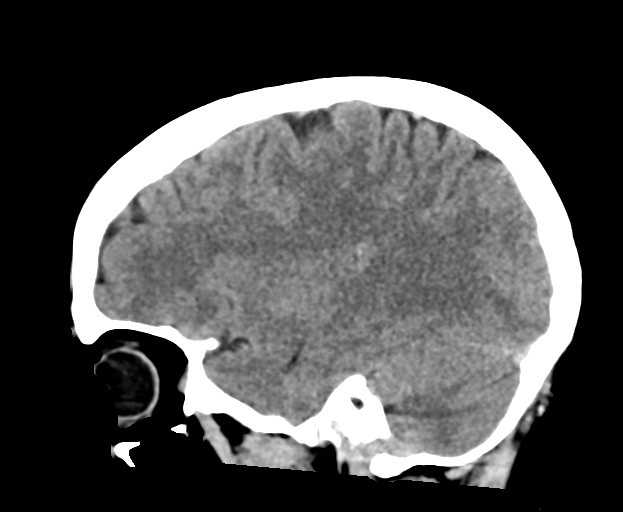
[im 29/58  brain]
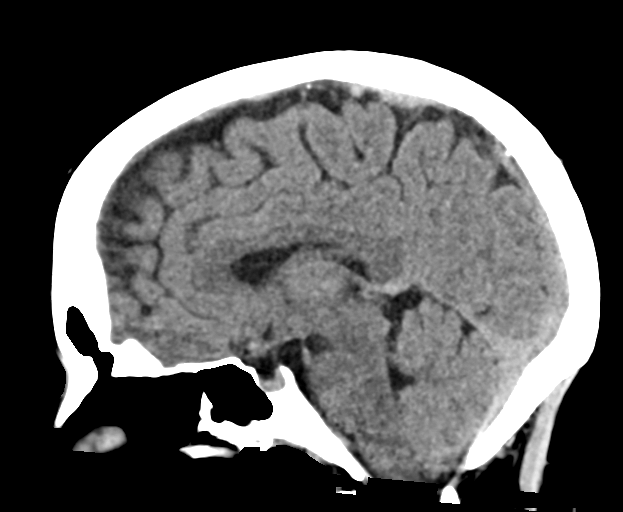
[im 39/58  brain]
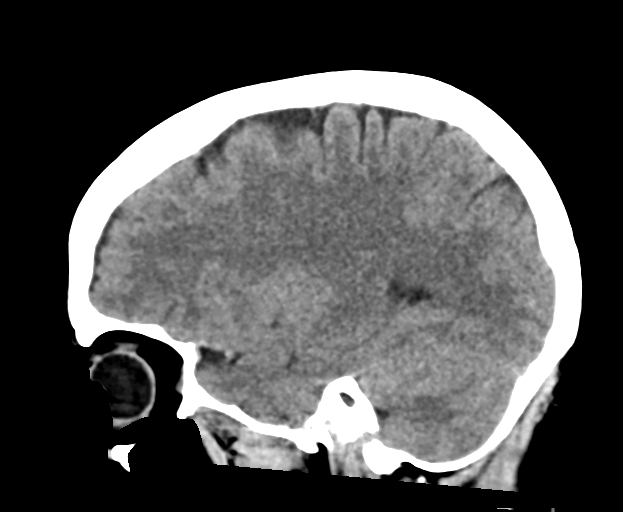

[16 of 47 positions shown; findings below may reference images not displayed]

FINDINGS: Brain: Ventricles and sulci are normal in size and configuration.
There is an apparent arachnoid cyst at the level of the lateral
right cerebellum peripherally measuring 6 x 6 mm. No other evident
mass. No hemorrhage, extra-axial fluid collection, or midline shift.
The brain parenchyma appears unremarkable. No acute infarct.

Vascular: No hyperdense vessel.  No evident vascular calcification.

Skull: Bony calvarium appears intact.

Sinuses/Orbits: Visualized paranasal sinuses are clear. Orbits
appear symmetric bilaterally.

Other: Mastoid air cells are clear.
IMPRESSION: Subcentimeter arachnoid cyst in the periphery of the right
cerebellar region, stable. No other evident mass. Brain parenchyma
appears unremarkable without evidence suggesting acute infarct. No
hemorrhage.

Study otherwise unremarkable.

## 2022-01-26 DIAGNOSIS — R5081 Fever presenting with conditions classified elsewhere: Secondary | ICD-10-CM | POA: Diagnosis not present

## 2022-01-26 DIAGNOSIS — Z20822 Contact with and (suspected) exposure to covid-19: Secondary | ICD-10-CM | POA: Diagnosis not present

## 2022-01-26 DIAGNOSIS — H6502 Acute serous otitis media, left ear: Secondary | ICD-10-CM | POA: Diagnosis not present

## 2022-02-04 DIAGNOSIS — H6505 Acute serous otitis media, recurrent, left ear: Secondary | ICD-10-CM | POA: Diagnosis not present

## 2022-02-04 DIAGNOSIS — H6993 Unspecified Eustachian tube disorder, bilateral: Secondary | ICD-10-CM | POA: Diagnosis not present

## 2022-02-06 ENCOUNTER — Encounter: Payer: Self-pay | Admitting: Nurse Practitioner

## 2022-02-06 ENCOUNTER — Telehealth (INDEPENDENT_AMBULATORY_CARE_PROVIDER_SITE_OTHER): Payer: Medicare Other | Admitting: Nurse Practitioner

## 2022-02-06 DIAGNOSIS — H9202 Otalgia, left ear: Secondary | ICD-10-CM

## 2022-02-06 MED ORDER — FLUTICASONE PROPIONATE 50 MCG/ACT NA SUSP: 2.0000 | Freq: Every day | NASAL | 6 refills | Status: DC

## 2022-02-06 NOTE — Progress Notes (Signed)
Virtual Visit Consent   [name], you are scheduled for a virtual visit with [name], [place], a [facility] provider, today.     Just as with appointments in the office, your consent must be obtained to participate.  Your consent will be active for this visit and any virtual visit you may have with one of our providers in the next 365 days.     If you have a MyChart account, a copy of this consent can be sent to you electronically.  All virtual visits are billed to your insurance company just like a traditional visit in the office.    As this is a virtual visit, video technology does not allow for your provider to perform a traditional examination.  This may limit your provider's ability to fully assess your condition.  If your provider identifies any concerns that need to be evaluated in person or the need to arrange testing (such as labs, EKG, etc.), we will make arrangements to do so.     Although advances in technology are sophisticated, we cannot ensure that it will always work on either your end or our end.  If the connection with a video visit is poor, the visit may have to be switched to a telephone visit.  With either a video or telephone visit, we are not always able to ensure that we have a secure connection.     I need to obtain your verbal consent now.   Are you willing to proceed with your visit today? YES   [name] has provided verbal consent on 02/06/2022 for a virtual visit (video or telephone).   [name], FNP   Date: 02/06/2022 12:24 PM   Virtual Visit via Video Note   I, [name], connected with [name] ([id number], 09-29-1975) on 02/06/22 at  4:45 PM EST by a video-enabled telemedicine application and verified that I am speaking with the correct person using two identifiers.  Location: Patient: Virtual Visit Location Patient: Home Provider: Virtual Visit Location Provider: Mobile   I discussed the  limitations of evaluation and management by telemedicine and the availability of in person appointments. The patient expressed understanding and agreed to proceed.    History of Present Illness: [name] is a 46 y.o. who identifies as a female who was assigned female at birth, and is being seen today for otalgia.  HPI: Patient has been seen in urgent care 2x in the last month with ear pain. She has been on antibiotics and steroids- no better.  Otalgia  There is pain in the left ear. This is a chronic problem. The current episode started 1 to 4 weeks ago. The problem occurs constantly. The problem has been waxing and waning. There has been no fever. The pain is at a severity of 3/10. Pertinent negatives include no ear discharge. The treatment provided mild relief.    Review of Systems  HENT:  Positive for ear pain. Negative for ear discharge.     Problems:  Patient Active Problem List   Diagnosis Date Noted   Abnormal finding of diagnostic imaging 12/22/2020   Angular cheilitis with candidiasis 08/24/2020   Thyroid disorder 07/15/2020   Weight gain 07/15/2020   Subacute frontal sinusitis 07/13/2020   Generalized anxiety disorder    Asthma    Migraine headaches    Knee pain, bilateral 07/01/2020   Trochanteric bursitis of right hip 07/01/2020   Dysuria 04/30/2020   Presence of auditory hallucinations 12/14/2019  Chronic obstructive pulmonary disease 11/28/2019   Primary insomnia 11/28/2019   Major depressive disorder 11/13/2018   Anemia 08/19/2018   Hypokalemia 08/19/2018   Enlarged lymph node 05/30/2017   DDD (degenerative disc disease), cervical 05/27/2017   Long-term current use of opiate analgesic 04/25/2017   Chronic low back pain 04/25/2017   Chronic neck pain 04/25/2017   Nocturnal hypoxia 08/10/2016   Tubular adenoma of colon 03/26/2016   Chronic pain syndrome 03/05/2016   Essential hypertension 09/03/2015   Irritable bowel syndrome without diarrhea  10/15/2014   Tobacco dependence 01/27/2013   Hot flashes not due to menopause 10/14/2012   Pelvic and perineal pain 06/17/2012   Bipolar 1 disorder 05/15/2012   Rectal prolapse 05/15/2012    Allergies:  Allergies  Allergen Reactions   Amoxicillin Nausea And Vomiting   Chantix [Varenicline] Other (See Comments)    Causes bad nightmares, hallucinations   Codeine Other (See Comments)    Shaking    Divalproex Sodium Other (See Comments)    sedation   Lithium Other (See Comments)    Drowsiness, tremors drowsiness   Oxycodone-Acetaminophen Other (See Comments)   Propoxyphene Nausea And Vomiting   Other     Pt states "I cannot take steroids"   Penicillins    Augmentin [Amoxicillin-Pot Clavulanate] Diarrhea and Other (See Comments)   Prednisone     Moodiness   Zolpidem Other (See Comments)    Patient states it does not agree with her body.    Medications:  Current Outpatient Medications:    ALPRAZolam (XANAX) 1 MG tablet, Take 1 mg by mouth 4 (four) times daily as needed., Disp: , Rfl:    baclofen (LIORESAL) 10 MG tablet, TK 1 T PO TID PRN, Disp: , Rfl:    Calcium Polycarbophil (FIBER-CAPS PO), Take 1 capsule by mouth daily.  (Patient not taking: Reported on 08/04/2021), Disp: , Rfl:    cetirizine (ZYRTEC ALLERGY) 10 MG tablet, Take 1 tablet (10 mg total) by mouth daily. (Patient not taking: Reported on 08/04/2021), Disp: 30 tablet, Rfl: 0   ciclopirox (PENLAC) 8 % solution, Apply topically at bedtime. Apply over nail and surrounding skin. Apply daily over previous coat. After seven (7) days, may remove with alcohol and continue cycle., Disp: 6.6 mL, Rfl: 0   fluconazole (DIFLUCAN) 150 MG tablet, 1 po q week x 4 weeks (Patient not taking: Reported on 08/04/2021), Disp: 4 tablet, Rfl: 0   fluPHENAZine (PROLIXIN) 1 MG tablet, Take 1 mg by mouth at bedtime. (Patient not taking: Reported on 08/04/2021), Disp: , Rfl:    Galcanezumab-gnlm (EMGALITY) 120 MG/ML SOAJ, Inject 120 mg into the skin every  28 (twenty-eight) days. (Patient not taking: Reported on 08/04/2021), Disp: 1.12 mL, Rfl: 5   HYDROcodone-acetaminophen (NORCO) 10-325 MG tablet, Take 1 tablet by mouth every 6 (six) hours as needed for moderate pain (For Neck and Back Pain)., Disp: , Rfl:    linaclotide (LINZESS) 145 MCG CAPS capsule, Take 1 capsule (145 mcg total) by mouth daily before breakfast., Disp: 30 capsule, Rfl: 0   naloxone (NARCAN) nasal spray 4 mg/0.1 mL, , Disp: , Rfl:    omeprazole (PRILOSEC) 40 MG capsule, Take by mouth., Disp: , Rfl:    oxyCODONE-acetaminophen (PERCOCET) 10-325 MG tablet, Take 1 tablet by mouth 4 (four) times daily as needed., Disp: , Rfl:    promethazine (PHENERGAN) 25 MG tablet, Take 1 tablet (25 mg total) by mouth every 6 (six) hours as needed for nausea or vomiting. (Patient not taking: Reported  on 08/04/2021), Disp: 30 tablet, Rfl: 0   QUEtiapine (SEROQUEL) 400 MG tablet, Take 800 mg by mouth at bedtime., Disp: , Rfl:    rizatriptan (MAXALT) 10 MG tablet, TAKE 1 TABLET BY MOUTH AS NEEDED FOR MIGRAINE-MAY REPEAT IN 2 HRS IF NEEDED-MAX 2/24HRS, Disp: 10 tablet, Rfl: 0   sodium chloride (OCEAN) 0.65 % SOLN nasal spray, Place 1 spray into both nostrils as needed for congestion., Disp: 60 mL, Rfl: 2   tamsulosin (FLOMAX) 0.4 MG CAPS capsule, TAKE 1 CAPSULE BY MOUTH EVERY DAY, Disp: 90 capsule, Rfl: 0   vortioxetine HBr (TRINTELLIX) 20 MG TABS tablet, Take 1 tablet (20 mg total) by mouth daily. (Patient not taking: Reported on 08/04/2021), Disp: 90 tablet, Rfl: 1  Observations/Objective: Patient is well-developed, well-nourished in no acute distress.  Resting comfortably  at home.  Head is normocephalic, atraumatic.  No labored breathing.  Speech is clear and coherent with logical content.  Patient is alert and oriented at baseline.    Assessment and Plan:  LATECIA MILER in today with chief complaint of Otalgia   1. Left ear pain Force fluids OTC decongestant If not improving by Thursday  will do ENT referral Meds ordered this encounter  Medications   fluticasone (FLONASE) 50 MCG/ACT nasal spray    Sig: Place 2 sprays into both nostrils daily.    Dispense:  16 g    Refill:  6    Order Specific Question:   Supervising Provider    Answer:   Caryl Pina A [[id number]]        Follow Up Instructions: I discussed the assessment and treatment plan with the patient. The patient was provided an opportunity to ask questions and all were answered. The patient agreed with the plan and demonstrated an understanding of the instructions.  A copy of instructions were sent to the patient via MyChart.  The patient was advised to call back or seek an in-person evaluation if the symptoms worsen or if the condition fails to improve as anticipated.  Time:  I spent 7 minutes with the patient via telehealth technology discussing the above problems/concerns.    [name], FNP

## 2022-02-06 NOTE — Patient Instructions (Signed)
Earache, Adult An earache, or ear pain, can be caused by many things, including: An infection. Ear wax buildup. Ear pressure. Something in the ear that should not be there (foreign body). A sore throat. Tooth problems. Jaw problems. Treatment of the earache will depend on the cause. If the cause is not clear or cannot be known, you may need to watch your symptoms until your earache goes away or until a cause is found. Follow these instructions at home: Medicines Take or apply over-the-counter and prescription medicines only as told by your health care provider. If you were prescribed antibiotics, use them as told by your health care provider. Do not stop using the antibiotic even if you start to feel better. Do not put anything in your ear other than medicine that is prescribed by your health care provider. Managing pain     If directed, apply heat to the affected area as often as told by your health care provider. Use the heat source that your health care provider recommends, such as a moist heat pack or a heating pad. Place a towel between your skin and the heat source. Leave the heat on for 20-30 minutes. If your skin turns bright red, remove the heat right away to prevent burns. The risk of burns is higher if you cannot feel pain, heat, or cold. If directed, put ice on the affected area. To do this: Put ice in a plastic bag. Place a towel between your skin and the bag. Leave the ice on for 20 minutes, 2-3 times a day. If your skin turns bright red, remove the ice right away to prevent skin damage. The risk of skin damage is higher if you cannot feel pain, heat, or cold.  General instructions Pay attention to any changes in your symptoms. Try resting in an upright position instead of lying down. This may help to reduce pressure in your ear and relieve pain. Chew gum if it helps to relieve your ear pain. Treat any allergies as told by your health care provider. Drink enough fluid  to keep your urine pale yellow. It is up to you to get the results of any tests that were done. Ask your health care provider, or the department that is doing the tests, when your results will be ready. Contact a health care provider if: Your pain does not improve within 2 days. Your earache gets worse. You have new symptoms. You have a fever. Get help right away if: You have a severe headache. You have a stiff neck. You have trouble swallowing. You have redness or swelling behind your ear. You have fluid or blood coming from your ear. You have hearing loss. You feel dizzy. This information is not intended to replace advice given to you by your health care provider. Make sure you discuss any questions you have with your health care provider. Document Revised: 08/01/2021 Document Reviewed: 08/01/2021 Elsevier Patient Education  [employer].

## 2022-02-09 ENCOUNTER — Telehealth: Payer: Self-pay | Admitting: Nurse Practitioner

## 2022-02-09 DIAGNOSIS — H9202 Otalgia, left ear: Secondary | ICD-10-CM

## 2022-02-09 NOTE — Telephone Encounter (Signed)
Please do referral tp ent

## 2022-02-09 NOTE — Telephone Encounter (Signed)
REFERRAL REQUEST Telephone Note  Have you been seen at our office for this problem? yes (Advise that they may need an appointment with their PCP before a referral can be done)  Reason for Referral: ENT-pt sys that she still has fluid in LT ear Referral discussed with patient: yes  Best contact number of patient for referral team: [phone]    Has patient been seen by a specialist for this issue before: NO  Patient provider preference for referral: quickest pt can get an apt Patient location preference for referral: quickest pt can get an apt   Patient notified that referrals can take up to a week or longer to process. If they haven't heard anything within a week they should call back and speak with the referral department.

## 2022-02-09 NOTE — Telephone Encounter (Signed)
Referral placed.

## 2022-03-09 ENCOUNTER — Ambulatory Visit: Payer: Medicare Other | Admitting: Podiatry

## 2022-03-13 DIAGNOSIS — K130 Diseases of lips: Secondary | ICD-10-CM | POA: Diagnosis not present

## 2022-03-13 DIAGNOSIS — H6993 Unspecified Eustachian tube disorder, bilateral: Secondary | ICD-10-CM | POA: Diagnosis not present

## 2022-04-05 DIAGNOSIS — M5412 Radiculopathy, cervical region: Secondary | ICD-10-CM | POA: Diagnosis not present

## 2022-04-05 DIAGNOSIS — M542 Cervicalgia: Secondary | ICD-10-CM | POA: Diagnosis not present

## 2022-04-05 DIAGNOSIS — G894 Chronic pain syndrome: Secondary | ICD-10-CM | POA: Diagnosis not present

## 2022-04-05 DIAGNOSIS — M5459 Other low back pain: Secondary | ICD-10-CM | POA: Diagnosis not present

## 2022-04-05 DIAGNOSIS — Z79899 Other long term (current) drug therapy: Secondary | ICD-10-CM | POA: Diagnosis not present

## 2022-04-05 DIAGNOSIS — Z5181 Encounter for therapeutic drug level monitoring: Secondary | ICD-10-CM | POA: Diagnosis not present

## 2022-04-24 DIAGNOSIS — R1310 Dysphagia, unspecified: Secondary | ICD-10-CM | POA: Diagnosis not present

## 2022-04-24 DIAGNOSIS — K5903 Drug induced constipation: Secondary | ICD-10-CM | POA: Diagnosis not present

## 2022-04-24 DIAGNOSIS — K921 Melena: Secondary | ICD-10-CM | POA: Diagnosis not present

## 2022-04-24 DIAGNOSIS — R140 Abdominal distension (gaseous): Secondary | ICD-10-CM | POA: Diagnosis not present

## 2022-04-24 DIAGNOSIS — K612 Anorectal abscess: Secondary | ICD-10-CM | POA: Diagnosis not present

## 2022-05-03 DIAGNOSIS — K629 Disease of anus and rectum, unspecified: Secondary | ICD-10-CM | POA: Diagnosis not present

## 2022-05-15 DIAGNOSIS — K625 Hemorrhage of anus and rectum: Secondary | ICD-10-CM | POA: Diagnosis not present

## 2022-05-15 DIAGNOSIS — K2950 Unspecified chronic gastritis without bleeding: Secondary | ICD-10-CM | POA: Diagnosis not present

## 2022-05-15 DIAGNOSIS — R1310 Dysphagia, unspecified: Secondary | ICD-10-CM | POA: Diagnosis not present

## 2022-06-22 ENCOUNTER — Telehealth: Payer: Self-pay | Admitting: Nurse Practitioner

## 2022-06-22 NOTE — Telephone Encounter (Signed)
Contacted [name] to schedule their annual wellness visit. Appointment made for 07/05/2022.  Thank you,  Colletta [place],  [place] [facility] Direct Dial ??[phone]

## 2022-06-29 DIAGNOSIS — Z79899 Other long term (current) drug therapy: Secondary | ICD-10-CM | POA: Diagnosis not present

## 2022-06-29 DIAGNOSIS — K611 Rectal abscess: Secondary | ICD-10-CM | POA: Diagnosis not present

## 2022-06-29 DIAGNOSIS — Z881 Allergy status to other antibiotic agents status: Secondary | ICD-10-CM | POA: Diagnosis not present

## 2022-06-29 DIAGNOSIS — K644 Residual hemorrhoidal skin tags: Secondary | ICD-10-CM | POA: Diagnosis not present

## 2022-06-29 DIAGNOSIS — J449 Chronic obstructive pulmonary disease, unspecified: Secondary | ICD-10-CM | POA: Diagnosis not present

## 2022-06-29 DIAGNOSIS — E785 Hyperlipidemia, unspecified: Secondary | ICD-10-CM | POA: Diagnosis not present

## 2022-06-29 DIAGNOSIS — Z880 Allergy status to penicillin: Secondary | ICD-10-CM | POA: Diagnosis not present

## 2022-06-29 DIAGNOSIS — I10 Essential (primary) hypertension: Secondary | ICD-10-CM | POA: Diagnosis not present

## 2022-06-29 DIAGNOSIS — K603 Anal fistula: Secondary | ICD-10-CM | POA: Diagnosis not present

## 2022-06-29 DIAGNOSIS — F17210 Nicotine dependence, cigarettes, uncomplicated: Secondary | ICD-10-CM | POA: Diagnosis not present

## 2022-06-29 DIAGNOSIS — Z888 Allergy status to other drugs, medicaments and biological substances status: Secondary | ICD-10-CM | POA: Diagnosis not present

## 2022-07-01 DIAGNOSIS — R39198 Other difficulties with micturition: Secondary | ICD-10-CM | POA: Diagnosis not present

## 2022-07-01 DIAGNOSIS — R0602 Shortness of breath: Secondary | ICD-10-CM | POA: Diagnosis not present

## 2022-07-01 DIAGNOSIS — R7981 Abnormal blood-gas level: Secondary | ICD-10-CM | POA: Diagnosis not present

## 2022-07-01 DIAGNOSIS — F32A Depression, unspecified: Secondary | ICD-10-CM | POA: Diagnosis not present

## 2022-07-01 DIAGNOSIS — R339 Retention of urine, unspecified: Secondary | ICD-10-CM | POA: Diagnosis not present

## 2022-07-01 DIAGNOSIS — Z9889 Other specified postprocedural states: Secondary | ICD-10-CM | POA: Diagnosis not present

## 2022-07-01 DIAGNOSIS — Z720 Tobacco use: Secondary | ICD-10-CM | POA: Diagnosis not present

## 2022-07-03 DIAGNOSIS — R339 Retention of urine, unspecified: Secondary | ICD-10-CM | POA: Diagnosis not present

## 2022-07-04 ENCOUNTER — Ambulatory Visit (INDEPENDENT_AMBULATORY_CARE_PROVIDER_SITE_OTHER): Payer: 59 | Admitting: Nurse Practitioner

## 2022-07-04 ENCOUNTER — Encounter: Payer: Self-pay | Admitting: Nurse Practitioner

## 2022-07-04 VITALS — BP 102/70 | HR 87 | Temp 97.2°F | Resp 20 | Ht 60.0 in | Wt 134.0 lb

## 2022-07-04 DIAGNOSIS — J449 Chronic obstructive pulmonary disease, unspecified: Secondary | ICD-10-CM

## 2022-07-04 DIAGNOSIS — R7981 Abnormal blood-gas level: Secondary | ICD-10-CM

## 2022-07-04 NOTE — Progress Notes (Unsigned)
Subjective:   [name] is a 47 y.o. female who presents for Medicare Annual (Subsequent) preventive examination.  Review of Systems    ***       Objective:    There were no vitals filed for this visit. There is no height or weight on file to calculate BMI.     02/08/2021    9:36 AM 06/08/2020    7:43 AM 12/16/2019   11:19 PM 12/03/2019    6:24 PM 11/09/2019    7:59 AM 09/23/2019   12:45 AM 06/24/2019    5:05 PM  Advanced Directives  Does Patient Have a Medical Advance Directive? No No No No No  No  Would patient like information on creating a medical advance directive? No - Patient declined  No - Patient declined    No - Patient declined     Information is confidential and restricted. Go to Review Flowsheets to unlock data.    Current Medications (verified) Outpatient Encounter Medications as of 07/05/2022  Medication Sig   ALPRAZolam (XANAX) 1 MG tablet Take 1 mg by mouth 4 (four) times daily as needed.   baclofen (LIORESAL) 10 MG tablet TK 1 T PO TID PRN   Calcium Polycarbophil (FIBER-CAPS PO) Take 1 capsule by mouth daily.  (Patient not taking: Reported on 07/04/2022)   cetirizine (ZYRTEC ALLERGY) 10 MG tablet Take 1 tablet (10 mg total) by mouth daily. (Patient not taking: Reported on 07/04/2022)   ciclopirox (PENLAC) 8 % solution Apply topically at bedtime. Apply over nail and surrounding skin. Apply daily over previous coat. After seven (7) days, may remove with alcohol and continue cycle.   fluconazole (DIFLUCAN) 150 MG tablet 1 po q week x 4 weeks (Patient not taking: Reported on 08/04/2021)   fluPHENAZine (PROLIXIN) 1 MG tablet Take 1 mg by mouth at bedtime. (Patient not taking: Reported on 08/04/2021)   fluticasone (FLONASE) 50 MCG/ACT nasal spray Place 2 sprays into both nostrils daily.   Galcanezumab-gnlm (EMGALITY) 120 MG/ML SOAJ Inject 120 mg into the skin every 28 (twenty-eight) days. (Patient not taking: Reported on 08/04/2021)   HYDROcodone-acetaminophen (NORCO)  10-325 MG tablet Take 1 tablet by mouth every 6 (six) hours as needed for moderate pain (For Neck and Back Pain). (Patient not taking: Reported on 07/04/2022)   linaclotide (LINZESS) 145 MCG CAPS capsule Take 1 capsule (145 mcg total) by mouth daily before breakfast. (Patient not taking: Reported on 07/04/2022)   naloxone (NARCAN) nasal spray 4 mg/0.1 mL    omeprazole (PRILOSEC) 40 MG capsule Take by mouth. (Patient not taking: Reported on 07/04/2022)   oxyCODONE-acetaminophen (PERCOCET) 10-325 MG tablet Take 1 tablet by mouth 4 (four) times daily as needed.   promethazine (PHENERGAN) 25 MG tablet Take 1 tablet (25 mg total) by mouth every 6 (six) hours as needed for nausea or vomiting. (Patient not taking: Reported on 07/04/2022)   QUEtiapine (SEROQUEL) 400 MG tablet Take 800 mg by mouth at bedtime.   rizatriptan (MAXALT) 10 MG tablet TAKE 1 TABLET BY MOUTH AS NEEDED FOR MIGRAINE-MAY REPEAT IN 2 HRS IF NEEDED-MAX 2/24HRS (Patient not taking: Reported on 07/04/2022)   sodium chloride (OCEAN) 0.65 % SOLN nasal spray Place 1 spray into both nostrils as needed for congestion.   tamsulosin (FLOMAX) 0.4 MG CAPS capsule TAKE 1 CAPSULE BY MOUTH EVERY DAY   vortioxetine HBr (TRINTELLIX) 20 MG TABS tablet Take 1 tablet (20 mg total) by mouth daily.   No facility-administered encounter medications on file as of  07/05/2022.    Allergies (verified) Amoxicillin, Chantix [varenicline], Codeine, Divalproex sodium, Lithium, Oxycodone-acetaminophen, Propoxyphene, Other, Penicillins, Augmentin [amoxicillin-pot clavulanate], Prednisone, and Zolpidem   History: Past Medical History:  Diagnosis Date   Anemia 08/19/2018   Asthma    Bipolar 1 disorder    Chronic low back pain 04/25/2017   Chronic neck pain 04/25/2017   Chronic obstructive pulmonary disease 11/28/2019   Chronic pain syndrome 03/05/2016   DDD (degenerative disc disease), cervical 05/27/2017   Dysuria 04/30/2020   Enlarged lymph node 05/30/2017   Seen on  CTA- reactive vs sarcoidosis.   Essential hypertension 09/03/2015   Generalized anxiety disorder    H. pylori infection    Hot flashes not due to menopause 10/14/2012   Hypokalemia 08/19/2018   Irritable bowel syndrome without diarrhea 10/15/2014   Seen by Jamse Arn   Knee pain, bilateral 07/01/2020   Major depressive disorder 11/13/2018   Migraine headaches    Nocturnal hypoxia 08/10/2016   Pelvic and perineal pain 06/17/2012   Presence of auditory hallucinations 12/14/2019   Primary insomnia 11/28/2019   Rectal prolapse 05/15/2012   Trochanteric bursitis of right hip 07/01/2020   Tubular adenoma of colon 03/26/2016   History of advanced adenoma. Surveillance colonoscopy in 2021 negative. Repeat next surveillance colonoscopy due in 5 years (2026).   Past Surgical History:  Procedure Laterality Date   ABDOMINAL HYSTERECTOMY     BLADDER SURGERY  2015   CHOLECYSTECTOMY     Family History  Problem Relation Age of Onset   Renal Disease Mother    Heart attack Father    Thyroid disease Neg Hx    Social History   Socioeconomic History   Marital status: Legally Separated    Spouse name: Not on file   Number of children: 3   Years of education: 11   Highest education level: 11th grade  Occupational History   Occupation: Disability  Tobacco Use   Smoking status: Every Day    Packs/day: 1    Types: Cigarettes   Smokeless tobacco: Never  Vaping Use   Vaping Use: Never used  Substance and Sexual Activity   Alcohol use: No   Drug use: No   Sexual activity: Not Currently    Birth control/protection: Surgical  Other Topics Concern   Not on file  Social History Narrative   Right handed   Drinks caffeine   One story home   Social Determinants of Health   Financial Resource Strain: Low Risk  (02/08/2021)   Overall Financial Resource Strain (CARDIA)    Difficulty of Paying Living Expenses: Not hard at all  Food Insecurity: No Food Insecurity (02/08/2021)   Hunger Vital Sign     Worried About Running Out of Food in the Last Year: Never true    Ran Out of Food in the Last Year: Never true  Transportation Needs: No Transportation Needs (02/08/2021)   PRAPARE - Hydrologist (Medical): No    Lack of Transportation (Non-Medical): No  Physical Activity: Sufficiently Active (02/08/2021)   Exercise Vital Sign    Days of Exercise per Week: 3 days    Minutes of Exercise per Session: 50 min  Stress: No Stress Concern Present (02/08/2021)   [place]    Feeling of Stress : Only a little  Social Connections: Socially Isolated (02/08/2021)   Social Connection and Isolation Panel [NHANES]    Frequency of Communication with Friends and Family: More than  three times a week    Frequency of Social Gatherings with Friends and Family: More than three times a week    Attends Religious Services: Never    Marine scientist or Organizations: No    Attends Archivist Meetings: Never    Marital Status: Separated    Tobacco Counseling Ready to quit: Not Answered Counseling given: Not Answered   Clinical Intake:                 Diabetic?No          Activities of Daily Living     No data to display          Patient Care Team: Chevis Pretty, FNP as PCP - General (Family Medicine)  Indicate any recent Medical Services you may have received from other than [facility] providers in the past year (date may be approximate).     Assessment:   This is a routine wellness examination for [facility].  Hearing/Vision screen No results found.  Dietary issues and exercise activities discussed:     Goals Addressed   None    Depression Screen    07/04/2022   11:40 AM 05/31/2021   11:31 AM 02/14/2021    2:47 PM 02/08/2021    9:11 AM 12/07/2020    1:38 PM 01/30/2020    2:29 PM 12/10/2019    8:35 AM  PHQ 2/9 Scores  PHQ - 2 Score 2 0 1 2 2  0 0  PHQ- 9 Score 4  4 3 4 4       Fall Risk    07/04/2022   11:40 AM 05/31/2021   11:32 AM 02/14/2021    2:47 PM 02/08/2021    9:31 AM 12/07/2020    1:38 PM  Fall Risk   Falls in the past year? 0 0 0 0 0  Number falls in past yr:    0   Injury with Fall?    0   Risk for fall due to :    Orthopedic patient;Medication side effect   Follow up    Falls prevention discussed     [place]:  Any stairs in or around the home? {YES/NO:21197} If so, are there any without handrails? {YES/NO:21197} Home free of loose throw rugs in walkways, pet beds, electrical cords, etc? {YES/NO:21197} Adequate lighting in your home to reduce risk of falls? {YES/NO:21197}  ASSISTIVE DEVICES UTILIZED TO PREVENT FALLS:  Life alert? {YES/NO:21197} Use of a cane, walker or w/c? {YES/NO:21197} Grab bars in the bathroom? {YES/NO:21197} Shower chair or bench in shower? {YES/NO:21197} Elevated toilet seat or a handicapped toilet? {YES/NO:21197}  TIMED UP AND GO:  Was the test performed? No . Telephonic visit   Cognitive Function:        02/08/2021    9:31 AM 04/02/2019    8:57 AM  6CIT Screen  What Year? 0 points 0 points  What month? 0 points 0 points  What time? 0 points 0 points  Count back from 20 0 points 0 points  Months in reverse 0 points 0 points  Repeat phrase 2 points 0 points  Total Score 2 points 0 points    Immunizations  There is no immunization history on file for this patient.  {TDAP status:2101805}  {Flu Vaccine status:2101806}  {Pneumococcal vaccine status:2101807}  {Covid-19 vaccine status:2101808}  Qualifies for Shingles Vaccine? {YES/NO:21197}  Zostavax completed {YES/NO:21197}  {Shingrix Completed?:2101804}  Screening Tests Health Maintenance  Topic Date Due  COVID-19 Vaccine (1) Never done   DTaP/Tdap/Td (1 - Tdap) Never done   PAP SMEAR-Modifier  04/25/2021   Medicare Annual Wellness (AWV)  02/08/2022   MAMMOGRAM  05/02/2022   INFLUENZA  VACCINE  11/02/2022   COLONOSCOPY (Pts 45-34yrs Insurance coverage will need to be confirmed)  02/08/2025   Hepatitis C Screening  Completed   HIV Screening  Completed   HPV VACCINES  Aged Out    Health Maintenance  Health Maintenance Due  Topic Date Due   COVID-19 Vaccine (1) Never done   DTaP/Tdap/Td (1 - Tdap) Never done   PAP SMEAR-Modifier  04/25/2021   Medicare Annual Wellness (AWV)  02/08/2022   MAMMOGRAM  05/02/2022    {Colorectal cancer screening:2101809}  {Mammogram status:21018020}  {Bone Density status:21018021}  Lung Cancer Screening: (Low Dose CT Chest recommended if Age 9-80 years, 30 pack-year currently smoking OR have quit w/in 15years.) {DOES NOT does:27190::"does not"} qualify.   Lung Cancer Screening Referral: ***  Additional Screening:  Hepatitis C Screening: {DOES NOT does:27190::"does not"} qualify; Completed ***  Vision Screening: Recommended annual ophthalmology exams for early detection of glaucoma and other disorders of the eye. Is the patient up to date with their annual eye exam?  {YES/NO:21197} Who is the provider or what is the name of the office in which the patient attends annual eye exams? *** If pt is not established with a provider, would they like to be referred to a provider to establish care? {YES/NO:21197}.   Dental Screening: Recommended annual dental exams for proper oral hygiene  Community Resource Referral / Chronic Care Management: CRR required this visit?  {YES/NO:21197}  CCM required this visit?  {YES/NO:21197}     Plan:     I have personally reviewed and noted the following in the patient's chart:   Medical and social history Use of alcohol, tobacco or illicit drugs  Current medications and supplements including opioid prescriptions. {Opioid Prescriptions:726 587 5182} Functional ability and status Nutritional status Physical activity Advanced directives List of other physicians Hospitalizations, surgeries, and  ER visits in previous 12 months Vitals Screenings to include cognitive, depression, and falls Referrals and appointments  In addition, I have reviewed and discussed with patient certain preventive protocols, quality metrics, and best practice recommendations. A written personalized care plan for preventive services as well as general preventive health recommendations were provided to patient.     Denman George [place], [place]   [id number]   Nurse Notes: ***

## 2022-07-04 NOTE — Progress Notes (Signed)
   Subjective:    Patient ID: [name], female    DOB: 1975/05/30, 47 y.o.   MRN: [id number]  HPI Patient come sin wanting oxygen level checked. The last few doctors office appt her O2 level was in the mid 80's. She had surgey and her oxygen level was 85% on room air after  surgery, she says she feels fatigued all the time. She does still smoke about 1 pack a day.    Review of Systems  Constitutional:  Negative for diaphoresis.  Eyes:  Negative for pain.  Respiratory:  Negative for shortness of breath.   Cardiovascular:  Negative for chest pain, palpitations and leg swelling.  Gastrointestinal:  Negative for abdominal pain.  Endocrine: Negative for polydipsia.  Skin:  Negative for rash.  Neurological:  Negative for dizziness, weakness and headaches.  Hematological:  Does not bruise/bleed easily.  All other systems reviewed and are negative.      Objective:   Physical Exam Vitals reviewed.  Constitutional:      Appearance: Normal appearance.  Cardiovascular:     Rate and Rhythm: Normal rate and regular rhythm.     Heart sounds: Normal heart sounds.  Pulmonary:     Effort: Pulmonary effort is normal.     Breath sounds: Normal breath sounds.  Genitourinary:    Comments: Has a foley catheter in since yesterday because of urinary retension. Neurological:     General: No focal deficit present.     Mental Status: She is alert and oriented to person, place, and time.     BP 102/70   Pulse 87   Temp (!) 97.2 F (36.2 C) (Temporal)   Resp 20   Ht 5' (1.524 m)   Wt 134 lb (60.8 kg)   SpO2 93%   BMI 26.17 kg/m   O2 sat with exercise 87%      Assessment & Plan:   [name] in today with chief complaint of Wants oxygen order   1. Low oxygen saturation Ordered home oxygen Need a  pulse ox at home to monitor o2 sat DO NOT SMOKE with oxygen - For home use only DME oxygen    The above assessment and management plan was discussed with the patient. The  patient verbalized understanding of and has agreed to the management plan. Patient is aware to call the clinic if symptoms persist or worsen. Patient is aware when to return to the clinic for a follow-up visit. Patient educated on when it is appropriate to go to the emergency department.   [name], FNP

## 2022-07-04 NOTE — Patient Instructions (Incomplete)
[name] , Thank you for taking time to come for your Medicare Wellness Visit. I appreciate your ongoing commitment to your health goals. Please review the following plan we discussed and let me know if I can assist you in the future.   These are the goals we discussed:  Goals      DIET - INCREASE WATER INTAKE     Try to drink 6-8 glasses of water daily        This is a list of the screening recommended for you and due dates:  Health Maintenance  Topic Date Due   COVID-19 Vaccine (1) Never done   DTaP/Tdap/Td vaccine (1 - Tdap) Never done   Pap Smear  04/25/2021   Medicare Annual Wellness Visit  02/08/2022   Mammogram  05/02/2022   Flu Shot  11/02/2022   Colon Cancer Screening  02/08/2025   Hepatitis C Screening: USPSTF Recommendation to screen - Ages 18-79 yo.  Completed   HIV Screening  Completed   HPV Vaccine  Aged Out    Advanced directives:   Conditions/risks identified: Aim for 30 minutes of exercise or brisk walking, 6-8 glasses of water, and 5 servings of fruits and vegetables each day.  Next appointment: Follow up in one year for your annual wellness visit.   Preventive Care 40-64 Years, Female Preventive care refers to lifestyle choices and visits with your health care provider that can promote health and wellness. What does preventive care include? A yearly physical exam. This is also called an annual well check. Dental exams once or twice a year. Routine eye exams. Ask your health care provider how often you should have your eyes checked. Personal lifestyle choices, including: Daily care of your teeth and gums. Regular physical activity. Eating a healthy diet. Avoiding tobacco and drug use. Limiting alcohol use. Practicing safe sex. Taking low-dose aspirin daily starting at age 15. Taking vitamin and mineral supplements as recommended by your health care provider. What happens during an annual well check? The services and screenings done by your health  care provider during your annual well check will depend on your age, overall health, lifestyle risk factors, and family history of disease. Counseling  Your health care provider may ask you questions about your: Alcohol use. Tobacco use. Drug use. Emotional well-being. Home and relationship well-being. Sexual activity. Eating habits. Work and work Statistician. Method of birth control. Menstrual cycle. Pregnancy history. Screening  You may have the following tests or measurements: Height, weight, and BMI. Blood pressure. Lipid and cholesterol levels. These may be checked every 5 years, or more frequently if you are over 37 years old. Skin check. Lung cancer screening. You may have this screening every year starting at age 72 if you have a 30-pack-year history of smoking and currently smoke or have quit within the past 15 years. Fecal occult blood test (FOBT) of the stool. You may have this test every year starting at age 36. Flexible sigmoidoscopy or colonoscopy. You may have a sigmoidoscopy every 5 years or a colonoscopy every 10 years starting at age 50. Hepatitis C blood test. Hepatitis B blood test. Sexually transmitted disease (STD) testing. Diabetes screening. This is done by checking your blood sugar (glucose) after you have not eaten for a while (fasting). You may have this done every 1-3 years. Mammogram. This may be done every 1-2 years. Talk to your health care provider about when you should start having regular mammograms. This may depend on whether you have a family  history of breast cancer. BRCA-related cancer screening. This may be done if you have a family history of breast, ovarian, tubal, or peritoneal cancers. Pelvic exam and Pap test. This may be done every 3 years starting at age 93. Starting at age 43, this may be done every 5 years if you have a Pap test in combination with an HPV test. Bone density scan. This is done to screen for osteoporosis. You may have this  scan if you are at high risk for osteoporosis. Discuss your test results, treatment options, and if necessary, the need for more tests with your health care provider. Vaccines  Your health care provider may recommend certain vaccines, such as: Influenza vaccine. This is recommended every year. Tetanus, diphtheria, and acellular pertussis (Tdap, Td) vaccine. You may need a Td booster every 10 years. Zoster vaccine. You may need this after age 12. Pneumococcal 13-valent conjugate (PCV13) vaccine. You may need this if you have certain conditions and were not previously vaccinated. Pneumococcal polysaccharide (PPSV23) vaccine. You may need one or two doses if you smoke cigarettes or if you have certain conditions. Talk to your health care provider about which screenings and vaccines you need and how often you need them. This information is not intended to replace advice given to you by your health care provider. Make sure you discuss any questions you have with your health care provider. Document Released: 04/16/2015 Document Revised: 12/08/2015 Document Reviewed: 01/19/2015 Elsevier Interactive Patient Education  2017 [place] Prevention in the Home Falls can cause injuries. They can happen to people of all ages. There are many things you can do to make your home safe and to help prevent falls. What can I do on the outside of my home? Regularly fix the edges of walkways and driveways and fix any cracks. Remove anything that might make you trip as you walk through a door, such as a raised step or threshold. Trim any bushes or trees on the path to your home. Use bright outdoor lighting. Clear any walking paths of anything that might make someone trip, such as rocks or tools. Regularly check to see if handrails are loose or broken. Make sure that both sides of any steps have handrails. Any raised decks and porches should have guardrails on the edges. Have any leaves, snow, or ice  cleared regularly. Use sand or salt on walking paths during winter. Clean up any spills in your garage right away. This includes oil or grease spills. What can I do in the bathroom? Use night lights. Install grab bars by the toilet and in the tub and shower. Do not use towel bars as grab bars. Use non-skid mats or decals in the tub or shower. If you need to sit down in the shower, use a plastic, non-slip stool. Keep the floor dry. Clean up any water that spills on the floor as soon as it happens. Remove soap buildup in the tub or shower regularly. Attach bath mats securely with double-sided non-slip rug tape. Do not have throw rugs and other things on the floor that can make you trip. What can I do in the bedroom? Use night lights. Make sure that you have a light by your bed that is easy to reach. Do not use any sheets or blankets that are too big for your bed. They should not hang down onto the floor. Have a firm chair that has side arms. You can use this for support while you get  dressed. Do not have throw rugs and other things on the floor that can make you trip. What can I do in the kitchen? Clean up any spills right away. Avoid walking on wet floors. Keep items that you use a lot in easy-to-reach places. If you need to reach something above you, use a strong step stool that has a grab bar. Keep electrical cords out of the way. Do not use floor polish or wax that makes floors slippery. If you must use wax, use non-skid floor wax. Do not have throw rugs and other things on the floor that can make you trip. What can I do with my stairs? Do not leave any items on the stairs. Make sure that there are handrails on both sides of the stairs and use them. Fix handrails that are broken or loose. Make sure that handrails are as long as the stairways. Check any carpeting to make sure that it is firmly attached to the stairs. Fix any carpet that is loose or worn. Avoid having throw rugs at the  top or bottom of the stairs. If you do have throw rugs, attach them to the floor with carpet tape. Make sure that you have a light switch at the top of the stairs and the bottom of the stairs. If you do not have them, ask someone to add them for you. What else can I do to help prevent falls? Wear shoes that: Do not have high heels. Have rubber bottoms. Are comfortable and fit you well. Are closed at the toe. Do not wear sandals. If you use a stepladder: Make sure that it is fully opened. Do not climb a closed stepladder. Make sure that both sides of the stepladder are locked into place. Ask someone to hold it for you, if possible. Clearly mark and make sure that you can see: Any grab bars or handrails. First and last steps. Where the edge of each step is. Use tools that help you move around (mobility aids) if they are needed. These include: Canes. Walkers. Scooters. Crutches. Turn on the lights when you go into a dark area. Replace any light bulbs as soon as they burn out. Set up your furniture so you have a clear path. Avoid moving your furniture around. If any of your floors are uneven, fix them. If there are any pets around you, be aware of where they are. Review your medicines with your doctor. Some medicines can make you feel dizzy. This can increase your chance of falling. Ask your doctor what other things that you can do to help prevent falls. This information is not intended to replace advice given to you by your health care provider. Make sure you discuss any questions you have with your health care provider. Document Released: 01/14/2009 Document Revised: 08/26/2015 Document Reviewed: 04/24/2014 Elsevier Interactive Patient Education  2017 [employer].

## 2022-07-04 NOTE — Patient Instructions (Signed)
Home Oxygen Use, Adult When a medical condition keeps you from getting enough oxygen, your health care provider may have you use extra oxygen at home. Your health care provider will let you know: When to use oxygen. How much oxygen to use. The amount is set in liters per minute (LPM or L/M). How long to use oxygen. Home oxygen can be given through: A mask. This covers the nose and mouth or covers a tracheotomy tube. A nasal cannula. This is a device or tube that goes in the nostrils. A transtracheal catheter. This is a small, thin tube placed through the neck and into the windpipe (trachea). A breathing tube (tracheostomy tube) that is surgically placed in the windpipe. These devices have tubing that connects to an oxygen source, such as: A tank. Tanks hold oxygen in gas form. The tank must be replaced when the oxygen is used up. A liquid oxygen device. This holds oxygen in liquid form. This must be replaced when the oxygen is used up. An oxygen concentrator machine. This filters oxygen in the room. It uses electricity so you must have a backup oxygen tank in case the power goes out. Work with your health care provider to find equipment that works best for you and your lifestyle. What are the risks? Your health care provider will talk with you about risks. These may include: Fire. This can happen if the oxygen is exposed to a heat source, flame, or spark. Injury to the skin. This can happen if liquid oxygen touches the skin. Pressure sores may occur if the oxygen tubing presses on the skin. Damage to the lungs or other organs. This can happen from getting too little or too much oxygen. Supplies needed: To use oxygen, you will need: A mask, nasal cannula, transtracheal catheter, or tracheostomy. An oxygen tank, a liquid oxygen device, or an oxygen concentrator. Your health care provider may recommend: A humidifier. This device adds moisture to the oxygen. A pulse oximeter. This device  measures the percentage of oxygen in your blood. How to use oxygen You will be shown how to use your oxygen device. Follow the instructions, which may look something like this: Wash your hands with soap and water for at least 20 seconds. Turn on the oxygen. Make sure the oxygen unit is working right. To do this: Place the end of the oxygen tubing in a cup of water before connecting it to the nasal cannula, mask, or transtracheal catheter. The water will bubble if oxygen is flowing. Place one end of the oxygen tubing into the port on the tank, device, or machine. Connect the other end to the nasal cannula, mask, or transtracheal catheter. Turn the liter-flow setting on the machine to the level you are told. Place the nasal cannula in your nose, or place the mask over your mouth and nose or tracheostomy tube. Turn off the oxygen when you are not using it. How to clean and care for the oxygen supplies Clean or replace your oxygen equipment and supplies as told by the medical device company that supplies the equipment. Safety tips Fire safety tips  Keep your oxygen and oxygen supplies at least 6 ft (2 m) away from sources of heat, flames, and sparks at all times. Do not allow smoking near your oxygen. Put up "no smoking" signs in your home. Avoid smoking areas in public. Do not use materials that can burn (are flammable) while you use oxygen. This includes: Petroleum jelly. Hand sanitizer. Rubbing alcohol. Hair spray  or other aerosol sprays. Keep a Data processing manager nearby. Tell your fire department that you have oxygen in your home. Test your home smoke detectors often. Traveling Secure your oxygen tank in the vehicle so that it does not move. Follow instructions from your medical device company about how to safely secure your tank. Have enough oxygen for the amount of time you will be away from home. If you plan to travel by public transportation such as airplane, train, bus, or boat,  contact the company to arrange a portable oxygen delivery system. You may need documents from your health care provider and medical device company before you travel. General safety tips Keep extra supplies on hand, including extra tubing and an extra cannula or mask. If you use an oxygen cylinder, keep it in a stand or secure it to an object that will not move. If you use liquid oxygen, always keep the container upright. If you use an oxygen concentrator: Tell Loss adjuster, chartered company. Make sure you are given priority service if your power goes out. Avoid using extension cords if possible. Keep an extra supply of backup oxygen tanks. Follow these instructions at home: Use oxygen only as told by your health care provider. Do not use alcohol or other drugs that make you relax (sedating drugs) unless told. They can slow down your breathing rate and make it hard to get in enough oxygen. Know how and when to order a refill of oxygen. Plan for holidays when you may not be able to get a prescription filled. Use water-based lubricants on your lips or nostrils. Do not use oil-based products like petroleum jelly. Ask your health care provider how to prevent skin irritation on your cheeks or behind your ears. Contact a health care provider if: You are more tired than normal and have little energy. You have dry or irritated skin in your nose or on your face. You have nosebleeds. You are restless, irritable, or anxious. You get headaches often. You are not sleeping well. Get help right away if: You have trouble breathing. You are confused. You are sleepy all the time. You have blue lips or fingernails. These symptoms may be an emergency. Get help right away. Call 911. Do not wait to see if the symptoms will go away. Do not drive yourself to the hospital. This information is not intended to replace advice given to you by your health care provider. Make sure you discuss any questions you have with your  health care provider. Document Revised: 10/18/2021 Document Reviewed: 10/18/2021 Elsevier Patient Education  [place].

## 2022-07-05 ENCOUNTER — Ambulatory Visit (INDEPENDENT_AMBULATORY_CARE_PROVIDER_SITE_OTHER): Payer: 59

## 2022-07-05 VITALS — Ht 60.0 in | Wt 134.0 lb

## 2022-07-05 DIAGNOSIS — Z0000 Encounter for general adult medical examination without abnormal findings: Secondary | ICD-10-CM

## 2022-07-05 NOTE — Progress Notes (Signed)
O2 order sent to [facility] through community message

## 2022-07-06 ENCOUNTER — Telehealth: Payer: Self-pay | Admitting: Nurse Practitioner

## 2022-07-06 NOTE — Telephone Encounter (Signed)
Pt aware order sent to [facility] and they will contact her.

## 2022-07-06 NOTE — Addendum Note (Signed)
Addended by: Antonietta Barcelona D on: 07/06/2022 08:26 AM   Modules accepted: Orders

## 2022-07-08 ENCOUNTER — Other Ambulatory Visit: Payer: Self-pay

## 2022-07-08 ENCOUNTER — Emergency Department (HOSPITAL_BASED_OUTPATIENT_CLINIC_OR_DEPARTMENT_OTHER): Payer: 59

## 2022-07-08 ENCOUNTER — Emergency Department (HOSPITAL_BASED_OUTPATIENT_CLINIC_OR_DEPARTMENT_OTHER)
Admission: EM | Admit: 2022-07-08 | Discharge: 2022-07-08 | Disposition: A | Discharge status: Home / Self Care | Payer: 59 | Attending: Emergency Medicine | Admitting: Emergency Medicine

## 2022-07-08 DIAGNOSIS — J449 Chronic obstructive pulmonary disease, unspecified: Secondary | ICD-10-CM | POA: Diagnosis not present

## 2022-07-08 DIAGNOSIS — Z79899 Other long term (current) drug therapy: Secondary | ICD-10-CM | POA: Diagnosis not present

## 2022-07-08 DIAGNOSIS — R319 Hematuria, unspecified: Secondary | ICD-10-CM | POA: Diagnosis present

## 2022-07-08 DIAGNOSIS — N3001 Acute cystitis with hematuria: Secondary | ICD-10-CM | POA: Diagnosis not present

## 2022-07-08 DIAGNOSIS — Z7951 Long term (current) use of inhaled steroids: Secondary | ICD-10-CM | POA: Insufficient documentation

## 2022-07-08 DIAGNOSIS — E875 Hyperkalemia: Secondary | ICD-10-CM | POA: Diagnosis not present

## 2022-07-08 DIAGNOSIS — D72829 Elevated white blood cell count, unspecified: Secondary | ICD-10-CM | POA: Diagnosis not present

## 2022-07-08 DIAGNOSIS — K5730 Diverticulosis of large intestine without perforation or abscess without bleeding: Secondary | ICD-10-CM | POA: Diagnosis not present

## 2022-07-08 DIAGNOSIS — I10 Essential (primary) hypertension: Secondary | ICD-10-CM | POA: Insufficient documentation

## 2022-07-08 LAB — URINALYSIS, W/ REFLEX TO CULTURE (INFECTION SUSPECTED)
Bilirubin Urine: NEGATIVE
Glucose, UA: NEGATIVE mg/dL
Ketones, ur: NEGATIVE mg/dL
Nitrite: NEGATIVE
Protein, ur: 30 mg/dL — AB
RBC / HPF: >50 RBC/hpf (ref 0–5)
Specific Gravity, Urine: 1.009 (ref 1.005–1.030)
WBC, UA: >50 WBC/hpf (ref 0–5)
pH: 7 (ref 5.0–8.0)

## 2022-07-08 LAB — COMPREHENSIVE METABOLIC PANEL
ALT: <5 U/L (ref 0–44)
AST: 18 U/L (ref 15–41)
Albumin: 4.1 g/dL (ref 3.5–5.0)
Alkaline Phosphatase: 122 U/L (ref 38–126)
Anion gap: 9 (ref 5–15)
BUN: 9 mg/dL (ref 6–20)
CO2: 27 mmol/L (ref 22–32)
Calcium: 10.2 mg/dL (ref 8.9–10.3)
Chloride: 102 mmol/L (ref 98–111)
Creatinine, Ser: 0.77 mg/dL (ref 0.44–1.00)
GFR, Estimated: >60 mL/min (ref >60)
Glucose, Bld: 156 mg/dL — ABNORMAL HIGH (ref 70–99)
Potassium: 5.3 mmol/L — ABNORMAL HIGH (ref 3.5–5.1)
Sodium: 138 mmol/L (ref 135–145)
Total Bilirubin: 0.3 mg/dL (ref 0.3–1.2)
Total Protein: 7 g/dL (ref 6.5–8.1)

## 2022-07-08 LAB — CBC WITH DIFFERENTIAL/PLATELET
Abs Immature Granulocytes: 0.05 10*3/uL (ref 0.00–0.07)
Basophils Absolute: 0 10*3/uL (ref 0.0–0.1)
Basophils Relative: 0 %
Eosinophils Absolute: 0.2 10*3/uL (ref 0.0–0.5)
Eosinophils Relative: 2 %
HCT: 49.1 % — ABNORMAL HIGH (ref 36.0–46.0)
Hemoglobin: 16.6 g/dL — ABNORMAL HIGH (ref 12.0–15.0)
Immature Granulocytes: 0 %
Lymphocytes Relative: 18 %
Lymphs Abs: 2.2 10*3/uL (ref 0.7–4.0)
MCH: 32 pg (ref 26.0–34.0)
MCHC: 33.8 g/dL (ref 30.0–36.0)
MCV: 94.6 fL (ref 80.0–100.0)
Monocytes Absolute: 0.7 10*3/uL (ref 0.1–1.0)
Monocytes Relative: 6 %
Neutro Abs: 8.7 10*3/uL — ABNORMAL HIGH (ref 1.7–7.7)
Neutrophils Relative %: 74 %
Platelets: 202 10*3/uL (ref 150–400)
RBC: 5.19 MIL/uL — ABNORMAL HIGH (ref 3.87–5.11)
RDW: 13.6 % (ref 11.5–15.5)
WBC: 11.9 10*3/uL — ABNORMAL HIGH (ref 4.0–10.5)
nRBC: 0 % (ref 0.0–0.2)

## 2022-07-08 LAB — LACTIC ACID, PLASMA: Lactic Acid, Venous: 0.7 mmol/L (ref 0.5–1.9)

## 2022-07-08 LAB — URINE CULTURE

## 2022-07-08 MED ORDER — CEFPODOXIME PROXETIL 200 MG PO TABS: 200.0000 mg | ORAL_TABLET | Freq: Two times a day (BID) | ORAL | 0 refills | Status: DC

## 2022-07-08 MED ORDER — SODIUM CHLORIDE 0.9 % IV SOLN
1.0000 g | Freq: Once | INTRAVENOUS | Status: AC
  Administered 2022-07-08: 1 g via INTRAVENOUS
  Filled 2022-07-08: qty 10

## 2022-07-08 NOTE — ED Provider Notes (Signed)
[facility] Note   CSN: [id number] Arrival date & time: 07/08/22  1121     History  Chief Complaint  Patient presents with   Hematuria   Flank Pain    bilateral    Nicole MolaShannon F Brawn is a 47 y.o. female.   Hematuria  Flank Pain   47 year old female presents emergency department with complaints of bilateral flank pain, suprapubic tenderness, hematuria, chills.  Patient states that she has been with indwelling Foley catheter since prior office visit from 07/03/2022. Patient had rectal fistulotomy surgery on 06/29/2022 has been dealing with urinary retention since then. Was treated for urinary tract infection at that time with Macrobid.  Patient reports persistent symptoms since then.  Reports beginning of red color/tinged urine beginning yesterday along with bilateral flank pain.  Patient states that she was sitting on the couch when symptoms began.  Denies fever but has had some chills at home.  Denies nausea, vomiting, chest pain, shortness of breath, change in bowel habits.  Patient currently still taking Macrobid as well as Flomax.  Patient has upcoming appointment with urology on Tuesday.  Patient on 2 L nasal cannula at baseline.  Past medical history significant for major depressive disorder, COPD, degenerative disc disease, chronic low back pain, hypertension, IBS, bipolar 1 disorder  Home Medications Prior to Admission medications   Medication Sig Start Date End Date Taking? Authorizing Provider  cefpodoxime (VANTIN) 200 MG tablet Take 1 tablet (200 mg total) by mouth 2 (two) times daily. 07/08/22  Yes Sherian Maroonobbins, Lizette Pazos A, PA  ALPRAZolam Prudy Feeler(XANAX) 1 MG tablet Take 1 mg by mouth 4 (four) times daily as needed. 09/09/19   [provider]  baclofen (LIORESAL) 10 MG tablet TK 1 T PO TID PRN 11/28/18   [provider]  Calcium Polycarbophil (FIBER-CAPS PO) Take 1 capsule by mouth daily.  Patient not taking: Reported on  07/04/2022    [provider]  cetirizine (ZYRTEC ALLERGY) 10 MG tablet Take 1 tablet (10 mg total) by mouth daily. Patient not taking: Reported on 07/04/2022 12/27/19   Dartha LodgeFord, Kelsey N, PA-C  ciclopirox [facility](PENLAC) 8 % solution Apply topically at bedtime. Apply over nail and surrounding skin. Apply daily over previous coat. After seven (7) days, may remove with alcohol and continue cycle. 12/07/21   Standiford, Jenelle MagesAlexander F, DPM  fluticasone (FLONASE) 50 MCG/ACT nasal spray Place 2 sprays into both nostrils daily. 02/06/22   Daphine DeutscherMartin, Mary-Margaret, FNP  Galcanezumab-gnlm (EMGALITY) 120 MG/ML SOAJ Inject 120 mg into the skin every 28 (twenty-eight) days. Patient not taking: Reported on 08/04/2021 06/08/20   Drema DallasJaffe, Adam R, DO  HYDROcodone-acetaminophen (NORCO) 10-325 MG tablet Take 1 tablet by mouth every 6 (six) hours as needed for moderate pain (For Neck and Back Pain). Patient not taking: Reported on 07/04/2022    [provider]  linaclotide Karlene Einstein(LINZESS) 145 MCG CAPS capsule Take 1 capsule (145 mcg total) by mouth daily before breakfast. Patient not taking: Reported on 07/04/2022 01/30/20   Bennie PieriniMartin, Mary-Margaret, FNP  naloxone [facility](NARCAN) nasal spray 4 mg/0.1 mL     [provider]  omeprazole (PRILOSEC) 40 MG capsule Take by mouth. Patient not taking: Reported on 07/04/2022 12/13/20   [provider]  oxyCODONE-acetaminophen (PERCOCET) 10-325 MG tablet Take 1 tablet by mouth 4 (four) times daily as needed. 12/06/21   [provider]  promethazine (PHENERGAN) 25 MG tablet Take 1 tablet (25 mg total) by mouth every 6 (six) hours as needed for  nausea or vomiting. Patient not taking: Reported on 07/04/2022 06/17/19   Raliegh Ip, DO  QUEtiapine (SEROQUEL) 400 MG tablet Take 800 mg by mouth at bedtime. 12/25/19   [provider]  rizatriptan (MAXALT) 10 MG tablet TAKE 1 TABLET BY MOUTH AS NEEDED FOR MIGRAINE-MAY REPEAT IN 2 HRS IF NEEDED-MAX 2/24HRS Patient not taking:  Reported on 07/04/2022 08/04/21   Daphine Deutscher, Mary-Margaret, FNP  sodium chloride (OCEAN) 0.65 % SOLN nasal spray Place 1 spray into both nostrils as needed for congestion. 07/13/20   Daryll Drown, NP  tamsulosin (FLOMAX) 0.4 MG CAPS capsule TAKE 1 CAPSULE BY MOUTH EVERY DAY 03/10/21   Daphine Deutscher, Mary-Margaret, FNP  vortioxetine HBr (TRINTELLIX) 20 MG TABS tablet Take 1 tablet (20 mg total) by mouth daily. 11/16/18   Malvin Johns, MD      Allergies    Amoxicillin, Chantix [varenicline], Codeine, Divalproex sodium, Lithium, Oxycodone-acetaminophen, Propoxyphene, Other, Penicillins, Augmentin [amoxicillin-pot clavulanate], Prednisone, and Zolpidem    Review of Systems   Review of Systems  Genitourinary:  Positive for flank pain and hematuria.    Physical Exam Updated Vital Signs BP (!) 131/98   Pulse 83   Temp 99 F (37.2 C) (Oral)   Resp 18   Ht 5' (1.524 m)   Wt 60.8 kg   SpO2 94%   BMI 26.18 kg/m  Physical Exam Vitals and nursing note reviewed.  Constitutional:      General: She is not in acute distress.    Appearance: She is well-developed.  HENT:     Head: Normocephalic and atraumatic.  Eyes:     Conjunctiva/sclera: Conjunctivae normal.  Cardiovascular:     Rate and Rhythm: Normal rate and regular rhythm.     Heart sounds: No murmur heard. Pulmonary:     Effort: Pulmonary effort is normal. No respiratory distress.     Breath sounds: Normal breath sounds. No wheezing, rhonchi or rales.  Abdominal:     Palpations: Abdomen is soft.     Tenderness: There is abdominal tenderness. There is right CVA tenderness and left CVA tenderness.     Comments: Suprapubic tenderness to palpation.  Musculoskeletal:        General: No swelling.     Cervical back: Neck supple.     Right lower leg: No edema.     Left lower leg: No edema.  Skin:    General: Skin is warm and dry.     Capillary Refill: Capillary refill takes less than 2 seconds.  Neurological:     Mental Status: She is alert.   Psychiatric:        Mood and Affect: Mood normal.     ED Results / Procedures / Treatments   Labs (all labs ordered are listed, but only abnormal results are displayed) Labs Reviewed  COMPREHENSIVE METABOLIC PANEL - Abnormal; Notable for the following components:      Result Value   Potassium 5.3 (*)    Glucose, Bld 156 (*)    All other components within normal limits  CBC WITH DIFFERENTIAL/PLATELET - Abnormal; Notable for the following components:   WBC 11.9 (*)    RBC 5.19 (*)    Hemoglobin 16.6 (*)    HCT 49.1 (*)    Neutro Abs 8.7 (*)    All other components within normal limits  URINALYSIS, W/ REFLEX TO CULTURE (INFECTION SUSPECTED) - Abnormal; Notable for the following components:   Color, Urine ORANGE (*)    APPearance CLOUDY (*)  Hgb urine dipstick LARGE (*)    Protein, ur 30 (*)    Leukocytes,Ua LARGE (*)    Bacteria, UA FEW (*)    All other components within normal limits  URINE CULTURE  LACTIC ACID, PLASMA  LACTIC ACID, PLASMA    EKG None  Radiology CT Renal Stone Study  Result Date: 07/08/2022 CLINICAL DATA:  47 year old female with history of abdominal and flank pain. EXAM: CT ABDOMEN AND PELVIS WITHOUT CONTRAST TECHNIQUE: Multidetector CT imaging of the abdomen and pelvis was performed following the standard protocol without IV contrast. RADIATION DOSE REDUCTION: This exam was performed according to the departmental dose-optimization program which includes automated exposure control, adjustment of the mA and/or kV according to patient size and/or use of iterative reconstruction technique. COMPARISON:  CT of the abdomen and pelvis 07/22/2018. FINDINGS: Lower chest: Small amount of pericardial fluid and/or thickening, similar to the prior examination, unlikely to be of hemodynamic significance at this time. Small amount of scarring in the right lower lobe again noted. Hepatobiliary: No definite suspicious cystic or solid hepatic lesions are confidently  identified on today's noncontrast CT examination. Status post cholecystectomy. Pancreas: No definite pancreatic mass or peripancreatic fluid collections or inflammatory changes are noted on today's noncontrast CT examination. Spleen: Unremarkable. Adrenals/Urinary Tract: There are no abnormal calcifications within the collecting system of either kidney, along the course of either ureter, or within the lumen of the urinary bladder. No hydroureteronephrosis or perinephric stranding to suggest urinary tract obstruction at this time. The unenhanced appearance of the kidneys is unremarkable bilaterally. Urinary bladder is nearly completely decompressed around an indwelling Foley balloon catheter. Small calcifications are noted adjacent to the Foley catheter in the lumen of the urinary bladder, suspicious for bladder calculi, largest of which on the left measures up to 3 mm. Bilateral adrenal glands are normal in appearance. Stomach/Bowel: Unenhanced appearance of the stomach is normal. No pathologic dilatation of small bowel or colon. A few scattered colonic diverticula are noted, without surrounding inflammatory changes to indicate an acute diverticulitis at this time. Normal appendix. Vascular/Lymphatic: Atherosclerosis in the abdominal aorta and pelvic vasculature. No lymphadenopathy noted in the abdomen or pelvis. Reproductive: Status post hysterectomy. Ovaries are not confidently identified may be surgically absent or atrophic. Other: No significant volume of ascites.  No pneumoperitoneum. Musculoskeletal: There are no aggressive appearing lytic or blastic lesions noted in the visualized portions of the skeleton. IMPRESSION: 1. No definite acute findings are noted in the abdomen or pelvis. 2. Small calcifications in the lumen of the urinary bladder likely reflect bladder calculi measuring up to 3 mm. 3. Aortic atherosclerosis. 4. Additional incidental findings, as above. Electronically Signed   By: Trudie Reedaniel  Entrikin  M.D.   On: 07/08/2022 12:44    Procedures Procedures    Medications Ordered in ED Medications  cefTRIAXone (ROCEPHIN) 1 g in sodium chloride 0.9 % 100 mL IVPB (0 g Intravenous Stopped 07/08/22 1508)    ED Course/ Medical Decision Making/ A&P                             Medical Decision Making Amount and/or Complexity of Data Reviewed Labs: ordered. Radiology: ordered.  Risk Prescription drug management.   This patient presents to the ED for concern of hematuria, UTI, flank pain, this involves an extensive number of treatment options, and is a complaint that carries with it a high risk of complications and morbidity.  The differential diagnosis includes urinary  tract infection, nephrolithiasis, pyelonephritis, appendicitis, diverticulitis, ectopic pregnancy, ovarian torsion, tubo-ovarian abscess, SBO/LBO, herpes zoster   Co morbidities that complicate the patient evaluation  See HPI   Additional history obtained:  Additional history obtained from EMR External records from outside source obtained and reviewed including hospital records   Lab Tests:  I Ordered, and personally interpreted labs.  The pertinent results include: Mild leukocytosis of 11.9.  No evidence of anemia.  Platelets within normal range.  Mild hyperkalemia of 5.3, otherwise electrolytes within normal limits.  No transaminitis.  No renal dysfunction.  UA significant for WBC clumps present, mucus present, hyaline cast present, few bacteria, greater than 50 WBC and RBC with large leukocytes indicative of urinary tract infection with hematuria.  Initial lactic acid of 0.7.  Urine culture pending.   Imaging Studies ordered:  I ordered imaging studies including CT renal stone study: I independently visualized and interpreted imaging which showed no acute abnormalities and pelvis or abdomen.  Small calcifications in lumen of urinary bladder likely bladder calculi measuring up to 3 mm.  Aortic atherosclerosis. I  agree with the radiologist interpretation  Cardiac Monitoring: / EKG:  The patient was maintained on a cardiac monitor.  I personally viewed and interpreted the cardiac monitored which showed an underlying rhythm of: Sinus rhythm   Consultations Obtained:  N/a   Problem List / ED Course / Critical interventions / Medication management  Urinary tract infection with hematuria I ordered medication including Rocephin Reevaluation of the patient after these medicines showed that the patient stayed the same I have reviewed the patients home medicines and have made adjustments as needed   Social Determinants of Health:  Chronic cigarette use.  Denies illicit drug use.   Test / Admission - Considered:  Urinary tract infection with hematuria Vitals signs  within normal range and stable throughout visit. Laboratory/imaging studies significant for: See above 47 year old female presents emergency department with symptoms concerning for urinary tract infection with possible pyelonephritis.  Workup today overall reassuring.  Urine with signs of infection without signs of nephrolithiasis or obvious inflammatory changes to kidney bilaterally on CT imaging.  Given patient's bilateral flank tenderness, appearance of urine with indwelling Foley catheter, will prescribe prolonged antibiotic course for concerns for possible pyelonephritis.  Urine culture pending at this time.  Patient already has upcoming appointment with urology on Tuesday for reassessment.  Foley catheter exchanged while in the emergency department given some concern for prior being nidus.  Patient with mild hyperkalemia and recommend repeat laboratory studies by primary care outpatient for reassessment.  Treatment plan discussed at length with patient and she acknowledged understanding was agreeable to said plan.  Patient overall well-appearing, afebrile in no acute distress. Worrisome signs and symptoms were discussed with the patient,  and the patient acknowledged understanding to return to the ED if noticed. Patient was stable upon discharge.          Final Clinical Impression(s) / ED Diagnoses Final diagnoses:  Acute cystitis with hematuria    Rx / DC Orders ED Discharge Orders          Ordered    cefpodoxime (VANTIN) 200 MG tablet  2 times daily        07/08/22 1454              Peter Garter, [place] 07/08/22 1620    Elayne Snare K, DO 07/09/22 [phone]

## 2022-07-08 NOTE — ED Triage Notes (Addendum)
Patient arrives with complaints of flank pain, blood in urine, and chills x2 days. Patient recently had rectal surgery developed urinary complication afterwards. She now has a foley catheter. She noticed blood in her urine and increased flank pain over the past couple days. Referred her by her physician for further evaluation.   Rates pain a 7/10.  ----also wears 2L of oxygen at night at baseline.

## 2022-07-08 NOTE — Discharge Instructions (Signed)
As discussed, you have evidence of urinary tract infection with small stones in your bladder.  No evidence of kidney stone or abnormalities of your kidney.  We have treated you with 1 dose of antibiotic in the emergency department.  Will continue another antibiotic called Vantin to take twice daily for the next 10 days; stop taking Macrobid or nitrofurantoin prescribed by prior provider.  Follow-up with urology on Tuesday for reassessment.  Please do not hesitate to return to emergency department for worrisome signs and symptoms we discussed become apparent.

## 2022-07-09 LAB — URINE CULTURE: Culture: NO GROWTH

## 2022-07-11 DIAGNOSIS — R338 Other retention of urine: Secondary | ICD-10-CM | POA: Diagnosis not present

## 2022-07-12 ENCOUNTER — Telehealth: Payer: Self-pay | Admitting: *Deleted

## 2022-07-12 NOTE — Telephone Encounter (Signed)
     Patient  visit on 07/08/2022  at [facility]  was for treatment   Have you been able to follow up with your primary care physician? patient has medicine and transportation will see the dr when she needs  The patient was able to obtain any needed medicine or equipment.  Are there diet recommendations that you are having difficulty following?  Patient expresses understanding of discharge instructions and education provided has no other needs at this time.  [name] -[facility] [place], Population [facility] [phone] [address] [place] , [place] [address] Email : [email or web address]

## 2022-08-02 DIAGNOSIS — R338 Other retention of urine: Secondary | ICD-10-CM | POA: Diagnosis not present

## 2022-08-02 DIAGNOSIS — G894 Chronic pain syndrome: Secondary | ICD-10-CM | POA: Diagnosis not present

## 2022-08-02 DIAGNOSIS — M5030 Other cervical disc degeneration, unspecified cervical region: Secondary | ICD-10-CM | POA: Diagnosis not present

## 2022-08-02 DIAGNOSIS — M5412 Radiculopathy, cervical region: Secondary | ICD-10-CM | POA: Diagnosis not present

## 2022-08-02 DIAGNOSIS — M542 Cervicalgia: Secondary | ICD-10-CM | POA: Diagnosis not present

## 2022-08-03 ENCOUNTER — Encounter: Payer: Self-pay | Admitting: Neurology

## 2022-09-12 DIAGNOSIS — M5412 Radiculopathy, cervical region: Secondary | ICD-10-CM | POA: Diagnosis not present

## 2022-09-14 DIAGNOSIS — I10 Essential (primary) hypertension: Secondary | ICD-10-CM | POA: Diagnosis not present

## 2022-09-14 DIAGNOSIS — R002 Palpitations: Secondary | ICD-10-CM | POA: Diagnosis not present

## 2022-09-14 DIAGNOSIS — Z720 Tobacco use: Secondary | ICD-10-CM | POA: Diagnosis not present

## 2022-09-14 DIAGNOSIS — I3139 Other pericardial effusion (noninflammatory): Secondary | ICD-10-CM | POA: Diagnosis not present

## 2022-10-26 DIAGNOSIS — I3139 Other pericardial effusion (noninflammatory): Secondary | ICD-10-CM | POA: Diagnosis not present

## 2022-10-27 DIAGNOSIS — K5903 Drug induced constipation: Secondary | ICD-10-CM | POA: Diagnosis not present

## 2022-10-27 DIAGNOSIS — R140 Abdominal distension (gaseous): Secondary | ICD-10-CM | POA: Diagnosis not present

## 2022-10-27 DIAGNOSIS — I10 Essential (primary) hypertension: Secondary | ICD-10-CM | POA: Diagnosis not present

## 2022-10-27 DIAGNOSIS — R112 Nausea with vomiting, unspecified: Secondary | ICD-10-CM | POA: Diagnosis not present

## 2022-10-31 DIAGNOSIS — I10 Essential (primary) hypertension: Secondary | ICD-10-CM | POA: Diagnosis not present

## 2022-10-31 DIAGNOSIS — R002 Palpitations: Secondary | ICD-10-CM | POA: Diagnosis not present

## 2022-10-31 DIAGNOSIS — I3139 Other pericardial effusion (noninflammatory): Secondary | ICD-10-CM | POA: Diagnosis not present

## 2022-10-31 DIAGNOSIS — Z720 Tobacco use: Secondary | ICD-10-CM | POA: Diagnosis not present

## 2022-12-11 DIAGNOSIS — M5459 Other low back pain: Secondary | ICD-10-CM | POA: Diagnosis not present

## 2022-12-11 DIAGNOSIS — G894 Chronic pain syndrome: Secondary | ICD-10-CM | POA: Diagnosis not present

## 2022-12-11 DIAGNOSIS — M5030 Other cervical disc degeneration, unspecified cervical region: Secondary | ICD-10-CM | POA: Diagnosis not present

## 2022-12-11 DIAGNOSIS — M5412 Radiculopathy, cervical region: Secondary | ICD-10-CM | POA: Diagnosis not present
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
## 2022-12-13 ENCOUNTER — Ambulatory Visit: Payer: 59 | Admitting: Neurology

## 2023-01-22 DIAGNOSIS — H02885 Meibomian gland dysfunction left lower eyelid: Secondary | ICD-10-CM | POA: Diagnosis not present

## 2023-01-22 DIAGNOSIS — H02882 Meibomian gland dysfunction right lower eyelid: Secondary | ICD-10-CM | POA: Diagnosis not present

## 2023-01-22 DIAGNOSIS — H16143 Punctate keratitis, bilateral: Secondary | ICD-10-CM | POA: Diagnosis not present

## 2023-01-22 DIAGNOSIS — H1045 Other chronic allergic conjunctivitis: Secondary | ICD-10-CM | POA: Diagnosis not present

## 2023-01-29 ENCOUNTER — Telehealth: Payer: Self-pay | Admitting: Nurse Practitioner

## 2023-01-29 ENCOUNTER — Ambulatory Visit (INDEPENDENT_AMBULATORY_CARE_PROVIDER_SITE_OTHER): Payer: 59 | Admitting: Nurse Practitioner

## 2023-01-29 ENCOUNTER — Other Ambulatory Visit: Payer: Self-pay | Admitting: Nurse Practitioner

## 2023-01-29 ENCOUNTER — Encounter: Payer: Self-pay | Admitting: Nurse Practitioner

## 2023-01-29 VITALS — BP 131/84 | HR 83 | Temp 97.6°F | Resp 20 | Ht 60.0 in | Wt 129.0 lb

## 2023-01-29 DIAGNOSIS — Z6825 Body mass index (BMI) 25.0-25.9, adult: Secondary | ICD-10-CM

## 2023-01-29 DIAGNOSIS — B079 Viral wart, unspecified: Secondary | ICD-10-CM

## 2023-01-29 DIAGNOSIS — L989 Disorder of the skin and subcutaneous tissue, unspecified: Secondary | ICD-10-CM | POA: Diagnosis not present

## 2023-01-29 MED ORDER — WEGOVY 0.25 MG/0.5ML ~~LOC~~ SOAJ
0.2500 mg | SUBCUTANEOUS | 2 refills | Status: DC
Start: 2023-01-29 — End: 2023-06-12

## 2023-01-29 NOTE — Progress Notes (Signed)
Subjective:    Patient ID: [name], female    DOB: 06/27/1975, 47 y.o.   MRN: [id number]   Chief Complaint: has a lesion growing on palm of right hand and has a lesion on left cheek.   HPI  - lesion of right palm- has been there about 2  months and is getting bigger. Does not hurt. - facial lesion on left cheek that needs to be looked at. Patient Active Problem List   Diagnosis Date Noted   Abnormal finding of diagnostic imaging 12/22/2020   Angular cheilitis with candidiasis 08/24/2020   Thyroid disorder 07/15/2020   Weight gain 07/15/2020   Subacute frontal sinusitis 07/13/2020   Generalized anxiety disorder    Asthma    Migraine headaches    Knee pain, bilateral 07/01/2020   Trochanteric bursitis of right hip 07/01/2020   Dysuria 04/30/2020   Presence of auditory hallucinations 12/14/2019   Chronic obstructive pulmonary disease 11/28/2019   Primary insomnia 11/28/2019   Major depressive disorder 11/13/2018   Anemia 08/19/2018   Hypokalemia 08/19/2018   Enlarged lymph node 05/30/2017   DDD (degenerative disc disease), cervical 05/27/2017   Long-term current use of opiate analgesic 04/25/2017   Chronic low back pain 04/25/2017   Chronic neck pain 04/25/2017   Nocturnal hypoxia 08/10/2016   Tubular adenoma of colon 03/26/2016   Chronic pain syndrome 03/05/2016   Essential hypertension 09/03/2015   Irritable bowel syndrome without diarrhea 10/15/2014   Tobacco dependence 01/27/2013   Hot flashes not due to menopause 10/14/2012   Pelvic and perineal pain 06/17/2012   Bipolar 1 disorder 05/15/2012   Rectal prolapse 05/15/2012       Review of Systems  Constitutional:  Negative for diaphoresis.  Eyes:  Negative for pain.  Respiratory:  Negative for shortness of breath.   Cardiovascular:  Negative for chest pain, palpitations and leg swelling.  Gastrointestinal:  Negative for abdominal pain.  Endocrine: Negative for polydipsia.  Skin:  Negative for rash.   Neurological:  Negative for dizziness, weakness and headaches.  Hematological:  Does not bruise/bleed easily.  All other systems reviewed and are negative.      Objective:   Physical Exam Vitals and nursing note reviewed.  Constitutional:      General: She is not in acute distress.    Appearance: Normal appearance. She is well-developed.  Neck:     Vascular: No carotid bruit or JVD.  Cardiovascular:     Rate and Rhythm: Normal rate and regular rhythm.     Heart sounds: Normal heart sounds.  Pulmonary:     Effort: Pulmonary effort is normal. No respiratory distress.     Breath sounds: Normal breath sounds. No wheezing or rales.  Chest:     Chest wall: No tenderness.  Abdominal:     General: There is no distension or abdominal bruit.     Palpations: There is no hepatomegaly, splenomegaly, mass or pulsatile mass.     Tenderness: There is no abdominal tenderness.  Musculoskeletal:        General: Normal range of motion.     Cervical back: Normal range of motion and neck supple.  Lymphadenopathy:     Cervical: No cervical adenopathy.  Skin:    General: Skin is warm and dry.     Comments: Left facial lesion- see picture attached Flesh colored lesion on palm of right hand  Neurological:     Mental Status: She is alert and oriented to person, place, and time.  Deep Tendon Reflexes: Reflexes are normal and symmetric.  Psychiatric:        Behavior: Behavior normal.        Thought Content: Thought content normal.        Judgment: Judgment normal.    BP 131/84   Pulse 83   Temp 97.6 F (36.4 C) (Temporal)   Resp 20   Ht 5' (1.524 m)   Wt 129 lb (58.5 kg)   SpO2 90%   BMI 25.19 kg/m         Assessment & Plan:   [name] in today with chief complaint of No chief complaint on file.   1. Facial lesion Referral to derm - Ambulatory referral to Dermatology  2. Wart of hand Patient will try compound w patches RTO prn    The above assessment and  management plan was discussed with the patient. The patient verbalized understanding of and has agreed to the management plan. Patient is aware to call the clinic if symptoms persist or worsen. Patient is aware when to return to the clinic for a follow-up visit. Patient educated on when it is appropriate to go to the emergency department.   Mary-Margaret Daphine Deutscher, FNP

## 2023-01-29 NOTE — Patient Instructions (Signed)
Warts  Warts are small growths on the skin. They are common and can occur on many areas of the body. A person may have one wart or several warts. In many cases, warts do not need treatment. They usually go away on their own over a period of many months to a few years. If needed, warts that cause problems or do not go away on their own can be treated. What are the causes? Warts are caused by a type of virus called human papillomavirus (HPV). HPV can spread from person to person through direct contact. Warts can also spread to other areas of the body when a person scratches a wart and then scratches another area of his or her body. What increases the risk? You are more likely to develop this condition if: You are 10-20 years old. You have a weakened body defense system (immune system). You are Caucasian. What are the signs or symptoms? The main symptom of this condition is small growths on the skin. Warts may: Be round or oval or have an irregular shape. Have a rough surface. Range in color from skin color to light yellow, brown, or gray. Generally be less than  inch (1.3 cm) in size. Go away and then come back again. Most warts are painless, but some can be painful if they are large or occur in an area of the body where pressure is applied to them, such as the bottom of the foot. How is this diagnosed? A wart can usually be diagnosed based on its appearance. In some cases, a tissue sample may be removed (biopsy) to be looked at under a microscope. How is this treated? In many cases, warts do not need treatment. Sometimes treatment is wanted. If treatment is needed or wanted, options may include: Applying medicated solutions, creams, or patches to the wart. These may be over-the-counter or prescription medicines that make the skin soft so that layers will gradually shed away. In many cases, the medicine is applied one or two times per day and covered with a bandage. Putting duct tape over  the top of the wart (occlusion). You will leave the tape in place for as long as told by your health care provider and then replace it with a new strip of tape. This is done until the wart goes away. Freezing the wart with liquid nitrogen (cryotherapy). Burning the wart with: Laser treatment. An electrified probe (electrocautery). Injection of a medicine into the wart to help the body's immune system fight off the wart. Surgery to remove the wart. Follow these instructions at home: Medicines Use over-the-counter and prescription medicines only as told by your health care provider. Do not use over-the-counter wart medicines on your face or genitals unless your health care provider tells you to do that. Lifestyle Keep your immune system healthy. To do this: Eat a healthy, balanced diet. Get enough sleep. Do not use any products that contain nicotine or tobacco. These products include cigarettes, chewing tobacco, and vaping devices, such as e-cigarettes. If you need help quitting, ask your health care provider. General instructions  Wash your hands after you touch a wart. Do not scratch or pick at a wart. Avoid shaving hair that is over a wart. Keep all follow-up visits. This is important. Contact a health care provider if: Your warts do not improve after treatment. You have redness, swelling, or pain at the site of a wart. You have bleeding from a wart that does not stop with light pressure. You have   diabetes and you develop a wart. Summary Warts are small growths on the skin. They are common and can occur on many areas of the body. In many cases, warts do not need treatment. Sometimes treatment is wanted. If treatment is needed or wanted, there are several treatment options. Apply over-the-counter and prescription medicines only as told by your health care provider. Wash your hands after you touch a wart. Keep all follow-up visits. This is important. This information is not intended  to replace advice given to you by your health care provider. Make sure you discuss any questions you have with your health care provider. Document Revised: 04/22/2021 Document Reviewed: 04/22/2021 Elsevier Patient Education  2024 [employer].

## 2023-01-29 NOTE — Telephone Encounter (Signed)
Wegovy sent to pharmacy

## 2023-01-29 NOTE — Telephone Encounter (Signed)
Meds ordered this encounter  Medications   Semaglutide-Weight Management (WEGOVY) 0.25 MG/0.5ML SOAJ    Sig: Inject 0.25 mg into the skin once a week.    Dispense:  2 mL    Refill:  2    Order Specific Question:   Supervising Provider    Answer:   Nils Pyle [[id number]]   Mary-Margaret Daphine Deutscher, FNP

## 2023-01-29 NOTE — Telephone Encounter (Signed)
Patient notified and verbalized understanding. 

## 2023-01-30 NOTE — Telephone Encounter (Signed)
Name from pharmacy: WEGOVY 0.25 MG/0.5 ML PEN  Pharmacy comment: Alternative Requested:DRUG NOT COVERED; PLEASE CONTACT INSURANCE OR CONSIDER ALTERNATIVE.

## 2023-01-30 NOTE — Telephone Encounter (Signed)
Please let patient know that insurance will not cover weight loss injections

## 2023-02-07 ENCOUNTER — Telehealth: Payer: Self-pay | Admitting: *Deleted

## 2023-02-07 NOTE — Telephone Encounter (Signed)
Copied from CRM [id number]. Topic: Referral - Status >> Feb 07, 2023  3:04 PM Eunice Blase wrote: Reason for CRM: Pt called regarding status of request referral to dermatologist. Please call pt [phone].

## 2023-02-08 NOTE — Telephone Encounter (Signed)
R/C to Patient - Patient is aware of Specialty Office information - Patient voiced understanding.

## 2023-02-21 DIAGNOSIS — M25562 Pain in left knee: Secondary | ICD-10-CM | POA: Diagnosis not present

## 2023-02-23 ENCOUNTER — Other Ambulatory Visit (HOSPITAL_COMMUNITY): Payer: Self-pay

## 2023-02-26 ENCOUNTER — Other Ambulatory Visit (HOSPITAL_COMMUNITY): Payer: Self-pay

## 2023-03-14 ENCOUNTER — Other Ambulatory Visit (HOSPITAL_COMMUNITY): Payer: Self-pay

## 2023-04-12 DIAGNOSIS — Z5181 Encounter for therapeutic drug level monitoring: Secondary | ICD-10-CM | POA: Diagnosis not present

## 2023-04-12 DIAGNOSIS — Z79899 Other long term (current) drug therapy: Secondary | ICD-10-CM | POA: Diagnosis not present

## 2023-04-12 DIAGNOSIS — M51362 Other intervertebral disc degeneration, lumbar region with discogenic back pain and lower extremity pain: Secondary | ICD-10-CM | POA: Diagnosis not present

## 2023-04-12 DIAGNOSIS — G894 Chronic pain syndrome: Secondary | ICD-10-CM | POA: Diagnosis not present

## 2023-04-12 DIAGNOSIS — M5030 Other cervical disc degeneration, unspecified cervical region: Secondary | ICD-10-CM | POA: Diagnosis not present

## 2023-04-12 DIAGNOSIS — M5459 Other low back pain: Secondary | ICD-10-CM | POA: Diagnosis not present

## 2023-04-26 ENCOUNTER — Encounter (HOSPITAL_COMMUNITY): Payer: Self-pay

## 2023-04-26 ENCOUNTER — Emergency Department (HOSPITAL_COMMUNITY)
Admission: EM | Admit: 2023-04-26 | Discharge: 2023-04-26 | Disposition: A | Discharge status: Home / Self Care | Payer: 59 | Attending: Emergency Medicine | Admitting: Emergency Medicine

## 2023-04-26 ENCOUNTER — Other Ambulatory Visit: Payer: Self-pay

## 2023-04-26 DIAGNOSIS — Z79899 Other long term (current) drug therapy: Secondary | ICD-10-CM | POA: Diagnosis not present

## 2023-04-26 DIAGNOSIS — R404 Transient alteration of awareness: Secondary | ICD-10-CM | POA: Diagnosis not present

## 2023-04-26 DIAGNOSIS — Z743 Need for continuous supervision: Secondary | ICD-10-CM | POA: Diagnosis not present

## 2023-04-26 DIAGNOSIS — R7989 Other specified abnormal findings of blood chemistry: Secondary | ICD-10-CM | POA: Diagnosis not present

## 2023-04-26 DIAGNOSIS — R0689 Other abnormalities of breathing: Secondary | ICD-10-CM | POA: Diagnosis not present

## 2023-04-26 DIAGNOSIS — R4781 Slurred speech: Secondary | ICD-10-CM | POA: Diagnosis not present

## 2023-04-26 DIAGNOSIS — R079 Chest pain, unspecified: Secondary | ICD-10-CM | POA: Diagnosis not present

## 2023-04-26 DIAGNOSIS — R0789 Other chest pain: Secondary | ICD-10-CM | POA: Diagnosis not present

## 2023-04-26 LAB — CBC WITH DIFFERENTIAL/PLATELET
Abs Immature Granulocytes: 0.06 10*3/uL (ref 0.00–0.07)
Basophils Absolute: 0 10*3/uL (ref 0.0–0.1)
Basophils Relative: 0 %
Eosinophils Absolute: 0.2 10*3/uL (ref 0.0–0.5)
Eosinophils Relative: 1 %
HCT: 44.5 % (ref 36.0–46.0)
Hemoglobin: 14.7 g/dL (ref 12.0–15.0)
Immature Granulocytes: 1 %
Lymphocytes Relative: 24 %
Lymphs Abs: 2.6 10*3/uL (ref 0.7–4.0)
MCH: 34.4 pg — ABNORMAL HIGH (ref 26.0–34.0)
MCHC: 33 g/dL (ref 30.0–36.0)
MCV: 104.2 fL — ABNORMAL HIGH (ref 80.0–100.0)
Monocytes Absolute: 0.7 10*3/uL (ref 0.1–1.0)
Monocytes Relative: 7 %
Neutro Abs: 7.2 10*3/uL (ref 1.7–7.7)
Neutrophils Relative %: 67 %
Platelets: 233 10*3/uL (ref 150–400)
RBC: 4.27 MIL/uL (ref 3.87–5.11)
RDW: 14.7 % (ref 11.5–15.5)
WBC: 10.8 10*3/uL — ABNORMAL HIGH (ref 4.0–10.5)
nRBC: 0 % (ref 0.0–0.2)

## 2023-04-26 LAB — COMPREHENSIVE METABOLIC PANEL
ALT: 11 U/L (ref 0–44)
AST: 22 U/L (ref 15–41)
Albumin: 3.8 g/dL (ref 3.5–5.0)
Alkaline Phosphatase: 105 U/L (ref 38–126)
Anion gap: 13 (ref 5–15)
BUN: 13 mg/dL (ref 6–20)
CO2: 31 mmol/L (ref 22–32)
Calcium: 11.7 mg/dL — ABNORMAL HIGH (ref 8.9–10.3)
Chloride: 95 mmol/L — ABNORMAL LOW (ref 98–111)
Creatinine, Ser: 1.53 mg/dL — ABNORMAL HIGH (ref 0.44–1.00)
GFR, Estimated: 42 mL/min — ABNORMAL LOW (ref >60)
Glucose, Bld: 126 mg/dL — ABNORMAL HIGH (ref 70–99)
Potassium: 3.6 mmol/L (ref 3.5–5.1)
Sodium: 139 mmol/L (ref 135–145)
Total Bilirubin: 0.4 mg/dL (ref 0.0–1.2)
Total Protein: 7 g/dL (ref 6.5–8.1)

## 2023-04-26 LAB — URINALYSIS, ROUTINE W REFLEX MICROSCOPIC
Bilirubin Urine: NEGATIVE
Glucose, UA: NEGATIVE mg/dL
Hgb urine dipstick: NEGATIVE
Ketones, ur: NEGATIVE mg/dL
Leukocytes,Ua: NEGATIVE
Nitrite: NEGATIVE
Protein, ur: NEGATIVE mg/dL
Specific Gravity, Urine: 1.015 (ref 1.005–1.030)
pH: 6 (ref 5.0–8.0)

## 2023-04-26 LAB — RAPID URINE DRUG SCREEN, HOSP PERFORMED
Amphetamines: NOT DETECTED
Barbiturates: NOT DETECTED
Benzodiazepines: POSITIVE — AB
Cocaine: NOT DETECTED
Opiates: POSITIVE — AB
Tetrahydrocannabinol: NOT DETECTED

## 2023-04-26 LAB — ETHANOL: Alcohol, Ethyl (B): <10 mg/dL (ref <10)

## 2023-04-26 MED ORDER — SODIUM CHLORIDE 0.9 % IV BOLUS
1000.0000 mL | Freq: Once | INTRAVENOUS | Status: AC
  Administered 2023-04-26: 1000 mL via INTRAVENOUS

## 2023-04-26 NOTE — ED Provider Notes (Signed)
[place] EMERGENCY DEPARTMENT AT [facility] Provider Note   CSN: [id number] Arrival date & time: 04/26/23  1434     History  Chief Complaint  Patient presents with   Chest Pain    [name] is a 48 y.o. female.  HPI Patient arrives via MS after an episode of unresponsiveness.  Patient provides some history, but cannot recall what occurred today.  Recalls 1 bedroom usual state of health yesterday.  Today she recalls awakening with EMS providers above her.  She believes she did take her medications, Percocet for back pain, currently complaining of pain in her upper abdomen.  The patient reportedly took 1 dose of Narcan after telling someone that she feels as though she overdosed following taking Percocet.  EMS did not provide aspirin, nor nitroglycerin.    Home Medications Prior to Admission medications   Medication Sig Start Date End Date Taking? Authorizing Provider  ALPRAZolam Prudy Feeler) 1 MG tablet Take 1 mg by mouth 4 (four) times daily as needed. 09/09/19   [provider]  baclofen (LIORESAL) 10 MG tablet TK 1 T PO TID PRN 11/28/18   [provider]  Calcium Polycarbophil (FIBER-CAPS PO) Take 1 capsule by mouth daily.    [provider]  cefpodoxime (VANTIN) 200 MG tablet Take 1 tablet (200 mg total) by mouth 2 (two) times daily. Patient not taking: Reported on 01/29/2023 07/08/22   Peter Garter, PA  cetirizine (ZYRTEC ALLERGY) 10 MG tablet Take 1 tablet (10 mg total) by mouth daily. 12/27/19   Rosezella Rumpf, PA-C  ciclopirox (PENLAC) 8 % solution Apply topically at bedtime. Apply over nail and surrounding skin. Apply daily over previous coat. After seven (7) days, may remove with alcohol and continue cycle. Patient not taking: Reported on 01/29/2023 12/07/21   Standiford, Jenelle Mages, DPM  fluticasone [facility]) 50 MCG/ACT nasal spray Place 2 sprays into both nostrils daily. 02/06/22   Daphine Deutscher, Mary-Margaret, FNP  Galcanezumab-gnlm  (EMGALITY) 120 MG/ML SOAJ Inject 120 mg into the skin every 28 (twenty-eight) days. Patient not taking: Reported on 01/29/2023 06/08/20   Drema Dallas, DO  HYDROcodone-acetaminophen (NORCO) 10-325 MG tablet Take 1 tablet by mouth every 6 (six) hours as needed for moderate pain (pain score 4-6) (For Neck and Back Pain).    [provider]  linaclotide Karlene Einstein) 145 MCG CAPS capsule Take 1 capsule (145 mcg total) by mouth daily before breakfast. 01/30/20   Daphine Deutscher, Mary-Margaret, FNP  naloxone [facility]) nasal spray 4 mg/0.1 mL     [provider]  omeprazole (PRILOSEC) 40 MG capsule Take by mouth. 12/13/20   [provider]  oxyCODONE-acetaminophen (PERCOCET) 10-325 MG tablet Take 1 tablet by mouth 4 (four) times daily as needed. 12/06/21   [provider]  promethazine (PHENERGAN) 25 MG tablet Take 1 tablet (25 mg total) by mouth every 6 (six) hours as needed for nausea or vomiting. 06/17/19   Raliegh Ip, DO  QUEtiapine (SEROQUEL) 400 MG tablet Take 800 mg by mouth at bedtime. 12/25/19   [provider]  rizatriptan (MAXALT) 10 MG tablet TAKE 1 TABLET BY MOUTH AS NEEDED FOR MIGRAINE-MAY REPEAT IN 2 HRS IF NEEDED-MAX 2/24HRS 08/04/21   Daphine Deutscher, Mary-Margaret, FNP  Semaglutide-Weight Management (WEGOVY) 0.25 MG/0.5ML SOAJ Inject 0.25 mg into the skin once a week. 01/29/23   Daphine Deutscher, Mary-Margaret, FNP  sodium chloride (OCEAN) 0.65 % SOLN nasal spray Place 1 spray into both nostrils as needed for congestion. 07/13/20   Purvis Sheffield,  Feliberto Harts, NP  vortioxetine HBr (TRINTELLIX) 20 MG TABS tablet Take 1 tablet (20 mg total) by mouth daily. 11/16/18   Malvin Johns, MD      Allergies    Amoxicillin, Chantix [varenicline], Codeine, Divalproex sodium, Lithium, Oxycodone-acetaminophen, Propoxyphene, Other, Penicillins, Augmentin [amoxicillin-pot clavulanate], Prednisone, and Zolpidem    Review of Systems   Review of Systems  Physical Exam Updated Vital Signs BP (!) 107/90    Pulse 90   Temp 98.2 F (36.8 C) (Oral)   Resp 13   Ht 5' (1.524 m)   Wt 58.5 kg   SpO2 94%   BMI 25.19 kg/m  Physical Exam Vitals and nursing note reviewed.  Constitutional:      General: She is not in acute distress.    Appearance: She is well-developed.  HENT:     Head: Normocephalic and atraumatic.  Eyes:     Conjunctiva/sclera: Conjunctivae normal.  Cardiovascular:     Rate and Rhythm: Normal rate and regular rhythm.  Pulmonary:     Effort: Pulmonary effort is normal. No respiratory distress.     Breath sounds: Normal breath sounds. No stridor.  Abdominal:     General: There is no distension.     Palpations: Abdomen is soft.     Tenderness: There is no abdominal tenderness. There is no guarding.  Skin:    General: Skin is warm and dry.  Neurological:     Mental Status: She is alert and oriented to person, place, and time.     Cranial Nerves: No cranial nerve deficit.  Psychiatric:        Mood and Affect: Mood normal.     ED Results / Procedures / Treatments   Labs (all labs ordered are listed, but only abnormal results are displayed) Labs Reviewed  COMPREHENSIVE METABOLIC PANEL  CBC WITH DIFFERENTIAL/PLATELET  ETHANOL  URINALYSIS, ROUTINE W REFLEX MICROSCOPIC  RAPID URINE DRUG SCREEN, HOSP PERFORMED    EKG None  Radiology No results found.  Procedures Procedures    Medications Ordered in ED Medications - No data to display  ED Course/ Medical Decision Making/ A&P                                 Medical Decision Making Adult female presents after episode of possible overdose versus presentation for upper abdominal pain.  Patient's abdomen is soft, nonperitoneal, but given concern for abdominal pain, chest pain, pain on overdose differential including ACS, intra-abdominal processes considered. Labs started, monitoring commenced, patient with pulse ox 94% 3l [place].   Cardiac 90 sinus normal  Amount and/or Complexity of Data Reviewed Independent  Historian: EMS Labs: ordered. Decision-making details documented in ED Course. Radiology: ordered. ECG/medicine tests: ordered and independent interpretation performed. Decision-making details documented in ED Course.   3:21 PM Patient accompanied by her son.  He was with her when she was minimally responsive, does not add additional details beyond that.  Patient awake, alert, offers that she is on oxygen as needed at home, consistent with current 3 L via nasal cannula necessity. Patient also notes that she has difficulty with bowel movements, and has had no bowel movements in 3 days which is not unusual for her, typically she needs additional assistance for these.  This may be explaining the patient's current upper abdominal pain.  Patient is otherwise awake, alert, in no distress, speaking clearly.  Labs x-ray pending, ECG without obvious abnormality.  Patient  will require repeat evaluation, review of labs x-ray, disposition, after presenting for episode of transient altered mental status, possibly accidental overdose.        Final Clinical Impression(s) / ED Diagnoses Final diagnoses:  Transient alteration of awareness    Rx / DC Orders ED Discharge Orders     None         Gerhard Munch, MD 04/26/23 1523

## 2023-04-26 NOTE — ED Triage Notes (Signed)
Pt BIB ems for ? Overdose. Per pt took one of percocet thought she overdosed and took a narcan. Pt does state epigastric "chest pain." Per EMS aspirin and nitroglycerin held but 20 gauge in LAC.

## 2023-04-26 NOTE — Discharge Instructions (Signed)
Your serum creatinine level is mildly elevated which is a marker of kidney function.  Please drink plenty of fluids and follow-up with your doctor to have this rechecked.  Your calcium was also mildly elevated here as well and this can be rechecked at the same time.  Please return to the emergency department for worsening symptoms.

## 2023-04-26 NOTE — ED Notes (Addendum)
Pt called out for the bathroom and ambulated to the bathroom. Pt trying for urine sample.

## 2023-04-26 NOTE — ED Provider Notes (Signed)
I was asked to follow-up on the patient's labs and reevaluate for ultimate disposition.  The patient is feeling well and was requesting discharge. Physical Exam  BP 135/88   Pulse 94   Temp 98.2 F (36.8 C) (Oral)   Resp 11   Ht 5' (1.524 m)   Wt 58.5 kg   SpO2 96%   BMI 25.19 kg/m   Physical Exam General: No acute distress, awake and alert  Procedures  Procedures  ED Course / MDM    Medical Decision Making Amount and/or Complexity of Data Reviewed Labs: ordered.   The patient is well-appearing on reassessment.  She does have a mild acute kidney injury and was given IV fluids here.  She is eating and drinking here without any difficulty.  Her calcium is also elevated but is less than 12.  I did discuss admission with the patient but she would rather not stay here in the hospital.  She is ultimately discharged through shared decision making is encouraged to follow-up with her primary care provider to have her labs rechecked.       Durwin Glaze, MD 04/26/23 [phone]

## 2023-04-26 NOTE — ED Notes (Signed)
Pt called out to use BR.  Pt ambulated with minimal assistance to BR, however was only able to obtain a small amt of urine for a UA.  RN aware, specimen sent to lab.

## 2023-05-03 ENCOUNTER — Telehealth: Payer: Self-pay | Admitting: *Deleted

## 2023-05-03 NOTE — Progress Notes (Signed)
Transition Care Management Unsuccessful Follow-up Telephone Call  Date of discharge and from where:  [facility] 04/26/2023  Attempts:  1st Attempt  Reason for unsuccessful TCM follow-up call:  No answer/busy
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
## 2023-05-09 ENCOUNTER — Ambulatory Visit: Payer: 59 | Admitting: Neurology

## 2023-05-09 DIAGNOSIS — J449 Chronic obstructive pulmonary disease, unspecified: Secondary | ICD-10-CM | POA: Diagnosis not present

## 2023-06-06 DIAGNOSIS — J449 Chronic obstructive pulmonary disease, unspecified: Secondary | ICD-10-CM | POA: Diagnosis not present
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
## 2023-06-11 ENCOUNTER — Ambulatory Visit: Payer: 59 | Admitting: Neurology

## 2023-06-12 ENCOUNTER — Ambulatory Visit (INDEPENDENT_AMBULATORY_CARE_PROVIDER_SITE_OTHER): Admitting: Nurse Practitioner

## 2023-06-12 ENCOUNTER — Encounter: Payer: Self-pay | Admitting: Nurse Practitioner

## 2023-06-12 VITALS — BP 143/89 | HR 110 | Temp 97.6°F | Ht 60.0 in | Wt 129.0 lb

## 2023-06-12 DIAGNOSIS — L739 Follicular disorder, unspecified: Secondary | ICD-10-CM | POA: Diagnosis not present

## 2023-06-12 MED ORDER — DESOXIMETASONE 0.25 % EX CREA
1.0000 | TOPICAL_CREAM | Freq: Two times a day (BID) | CUTANEOUS | 0 refills | Status: DC
Start: 2023-06-12 — End: 2023-11-19

## 2023-06-12 NOTE — Patient Instructions (Signed)
Folliculitis  Folliculitis occurs when hair follicles become inflamed. A hair follicle is a tiny opening in your skin where your hair grows from. This condition often occurs on the scalp, thighs, legs, back, and buttocks but can happen anywhere on the body. What are the causes? A common cause of this condition is an infection from bacteria. The type of folliculitis caused by bacteria can last a long time or go away and come back. The bacteria can live anywhere on your skin. They are often found in the nostrils. Other causes may include: An infection from a fungus. An infection from a virus. Your skin touching some chemicals, such as oils and tars. Shaving or waxing. Greasy ointments or creams put on the skin. What increases the risk? You are more likely to develop this condition if: Your body has a weak disease-fighting system (immune system). You have diabetes. You are obese. What are the signs or symptoms? Symptoms of this condition include: Redness. Soreness. Swelling. Itching. Small white or yellow, itchy spots filled with pus (pustules) that appear over a red area. If the infection goes deep into the follicle, these may turn into a boil (furuncle). A group of boils (carbuncle). These tend to form in hairy, sweaty areas of the body. How is this diagnosed? This condition is diagnosed with a skin exam. Your health care provider may take a sample of one of the pustules or boils to test in a lab. How is this treated? This condition may be treated by: Putting a warm, wet cloth (warm compress) on the affected areas. Taking antibiotics or applying them to the skin. Applying or bathing with a solution that kills germs (antiseptic). Taking an over-the-counter medicine. This can help with itching. Having a procedure to drain pustules or boils. This may be done if a pustule or boil contains a lot of pus or fluid. Having laser hair removal. This may be done when the condition lasts for a  long time. Follow these instructions at home: Managing pain and swelling  If directed, apply heat to the affected area as often as told by your health care provider. Use the heat source that your health care provider recommends, such as a moist heat pack or a heating pad. Place a towel between your skin and the heat source. Leave the heat on for 20-30 minutes. If your skin turns bright red, remove the heat right away to prevent burns. The risk of burns is higher if you cannot feel pain, heat, or cold. General instructions Take over-the-counter and prescription medicines only as told by your health care provider. If you were prescribed antibiotics, take or apply them as told by your health care provider. Do not stop using the antibiotic even if you start to feel better. Check your irritated area every day for signs of infection. Check for: More redness, swelling, or pain. Fluid or blood. Warmth. Pus or a bad smell. Do not shave irritated skin. Keep all follow-up visits. Your health care provider will check if the treatments are helping. Contact a health care provider if: You have a fever. You have any signs of infection. Red streaks are spreading from the affected area. This information is not intended to replace advice given to you by your health care provider. Make sure you discuss any questions you have with your health care provider. Document Revised: 08/23/2021 Document Reviewed: 08/23/2021 Elsevier Patient Education  2024 [employer].

## 2023-06-12 NOTE — Progress Notes (Signed)
 Subjective:    Patient ID: [name], female    DOB: Jul 21, 1975, 48 y.o.   MRN: [id number]   Chief Complaint: rash  Rash This is a new problem. The current episode started more than 1 month ago. The problem has been waxing and waning since onset. The affected locations include the back, right arm and left arm. The rash is characterized by redness and itchiness. Pertinent negatives include no eye pain or shortness of breath. Past treatments include nothing. The treatment provided no relief.    Patient Active Problem List   Diagnosis Date Noted   Abnormal finding of diagnostic imaging 12/22/2020   Angular cheilitis with candidiasis 08/24/2020   Thyroid disorder 07/15/2020   Weight gain 07/15/2020   Subacute frontal sinusitis 07/13/2020   Generalized anxiety disorder    Asthma    Migraine headaches    Knee pain, bilateral 07/01/2020   Trochanteric bursitis of right hip 07/01/2020   Dysuria 04/30/2020   Presence of auditory hallucinations 12/14/2019   Chronic obstructive pulmonary disease 11/28/2019   Primary insomnia 11/28/2019   Major depressive disorder 11/13/2018   Anemia 08/19/2018   Hypokalemia 08/19/2018   Enlarged lymph node 05/30/2017   DDD (degenerative disc disease), cervical 05/27/2017   Long-term current use of opiate analgesic 04/25/2017   Chronic low back pain 04/25/2017   Chronic neck pain 04/25/2017   Nocturnal hypoxia 08/10/2016   Tubular adenoma of colon 03/26/2016   Chronic pain syndrome 03/05/2016   Essential hypertension 09/03/2015   Irritable bowel syndrome without diarrhea 10/15/2014   Tobacco dependence 01/27/2013   Hot flashes not due to menopause 10/14/2012   Pelvic and perineal pain 06/17/2012   Bipolar 1 disorder 05/15/2012   Rectal prolapse 05/15/2012       Review of Systems  Constitutional:  Negative for diaphoresis.  Eyes:  Negative for pain.  Respiratory:  Negative for shortness of breath.   Cardiovascular:  Negative for  chest pain, palpitations and leg swelling.  Gastrointestinal:  Negative for abdominal pain.  Endocrine: Negative for polydipsia.  Skin:  Positive for rash.  Neurological:  Negative for dizziness, weakness and headaches.  Hematological:  Does not bruise/bleed easily.  All other systems reviewed and are negative.      Objective:   Physical Exam Constitutional:      Appearance: Normal appearance.  Cardiovascular:     Rate and Rhythm: Normal rate and regular rhythm.     Heart sounds: Normal heart sounds.  Pulmonary:     Effort: Pulmonary effort is normal.     Breath sounds: Normal breath sounds.  Skin:    General: Skin is warm.     Comments: Fine red raised rash in trunk and arms- can not see as much as can feel it.  Neurological:     General: No focal deficit present.     Mental Status: She is alert and oriented to person, place, and time.  Psychiatric:        Mood and Affect: Mood normal.        Behavior: Behavior normal.    BP (!) 143/89   Pulse (!) 110   Temp 97.6 F (36.4 C) (Temporal)   Ht 5' (1.524 m)   Wt 129 lb (58.5 kg)   SpO2 93%   BMI 25.19 kg/m         Assessment & Plan:   [name] in today with chief complaint of Rash (On back and shoulders)   1. Folliculitis (Primary) Mix  steroid cream with lotion and apply BID Avoid harsh soaps Rto prn - desoximetasone (TOPICORT) 0.25 % cream; Apply 1 Application topically 2 (two) times daily.  Dispense: 30 g; Refill: 0    The above assessment and management plan was discussed with the patient. The patient verbalized understanding of and has agreed to the management plan. Patient is aware to call the clinic if symptoms persist or worsen. Patient is aware when to return to the clinic for a follow-up visit. Patient educated on when it is appropriate to go to the emergency department.   Nicole Daphine Deutscher, FNP

## 2023-06-25 DIAGNOSIS — K5903 Drug induced constipation: Secondary | ICD-10-CM | POA: Diagnosis not present

## 2023-06-25 DIAGNOSIS — K603 Anal fistula, unspecified: Secondary | ICD-10-CM | POA: Diagnosis not present

## 2023-06-28 DIAGNOSIS — M7122 Synovial cyst of popliteal space [Baker], left knee: Secondary | ICD-10-CM | POA: Diagnosis not present

## 2023-07-07 DIAGNOSIS — J449 Chronic obstructive pulmonary disease, unspecified: Secondary | ICD-10-CM | POA: Diagnosis not present

## 2023-07-24 ENCOUNTER — Other Ambulatory Visit: Payer: Self-pay

## 2023-07-24 ENCOUNTER — Encounter: Payer: Self-pay | Admitting: Dermatology

## 2023-07-24 ENCOUNTER — Ambulatory Visit: Payer: 59 | Admitting: Dermatology

## 2023-07-24 DIAGNOSIS — K130 Diseases of lips: Secondary | ICD-10-CM | POA: Diagnosis not present

## 2023-07-24 DIAGNOSIS — Z9189 Other specified personal risk factors, not elsewhere classified: Secondary | ICD-10-CM | POA: Diagnosis not present

## 2023-07-24 DIAGNOSIS — D489 Neoplasm of uncertain behavior, unspecified: Secondary | ICD-10-CM

## 2023-07-24 DIAGNOSIS — B079 Viral wart, unspecified: Secondary | ICD-10-CM

## 2023-07-24 DIAGNOSIS — D0439 Carcinoma in situ of skin of other parts of face: Secondary | ICD-10-CM | POA: Diagnosis not present

## 2023-07-24 DIAGNOSIS — D492 Neoplasm of unspecified behavior of bone, soft tissue, and skin: Secondary | ICD-10-CM

## 2023-07-24 DIAGNOSIS — D044 Carcinoma in situ of skin of scalp and neck: Secondary | ICD-10-CM | POA: Diagnosis not present

## 2023-07-24 NOTE — Progress Notes (Signed)
 Sent medication to pharmacy.

## 2023-07-24 NOTE — Patient Instructions (Addendum)

## 2023-07-24 NOTE — Progress Notes (Signed)
 New Patient Visit   Subjective  [name] is a 48 y.o. female who presents for the following: lesions on face and hand  Patient states she has lesions located at the face and hand that she would like to have examined. Patient reports the areas have been there for 2 years. She reports the areas are bothersome.She states that the areas have not spread. Patient reports she has not previously been treated for these areas. Patient stated that she has not used any medication on the spots and uses Dove Moisturizer Cold Cream   The following portions of the chart were reviewed this encounter and updated as appropriate: medications, allergies, medical history  Review of Systems:  No other skin or systemic complaints except as noted in HPI or Assessment and Plan.  Objective  Well appearing patient in no apparent distress; mood and affect are within normal limits.   A focused examination was performed of the following areas: Face and Hand   Relevant exam findings are noted in the Assessment and Plan.  Head - Anterior (Face) 6mm brown papule    Head - Anterior (Face), Right Mid Palm Verrucous papules   Assessment & Plan    1. Suspicious skin lesion on nose - Assessment: 6-millimeter pink-brown plaque with mild scale on the cheek, growing since October. Concern for possible skin cancer, particularly squamous cell carcinoma, given patient's fair skin, light eyes, red hair, and history of sunburns. - Plan:    Perform skin biopsy of the suspicious lesion on the nose     - Obtain informed consent     - Apply lidocaine  for local anesthesia     - Send specimen to lab for pathological examination      Apply Aquaphor to biopsy site daily until healed  2. Warts - Assessment: Multiple warts identified, including a filiform wart on the face and a wart on the hand. Possibly related to gardening history. 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  - Plan:    Perform  cryotherapy with liquid nitrogen on facial and hand warts    Instruct patient on post-treatment care:     - Apply Aquaphor to treated areas daily     - For hand wart:       - After 1 week, begin nightly application of Compound W       - Cover with band-aid overnight       - In the morning, file down white, mushy skin and discard file       - Continue treatment until wart resolves    Follow up in 2 months for reassessment and possible repeat cryotherapy if needed  3. Angular cheilitis - Assessment: Cracking at the corners of the mouth, consistent with angular cheilitis (perlche). Associated with pooling of saliva in the deepened creases of the mouth angles, leading to yeast irritation. - Plan:    Prescribe hydrocortisone  ointment for daily application to affected areas for 2 weeks    After 2 weeks, discontinue hydrocortisone  and switch to nightly Aquaphor application as a barrier    Educate patient on potential skin thinning with prolonged steroid use  4. Skin cancer risk - Assessment: Multiple risk factors for skin cancer, including fair skin, light eyes, red hair, and history of sunburns.  - Plan:    Schedule full head-to-toe skin cancer screening in 2 months    Document exam findings as neoplasm of uncertain behavior in the skin  NEOPLASM OF UNCERTAIN BEHAVIOR Head -  Anterior (Face) Skin / nail biopsy Type of biopsy: tangential   Informed consent: discussed and consent obtained   Timeout: patient name, date of birth, surgical site, and procedure verified   Procedure prep:  Patient was prepped and draped in usual sterile fashion Prep type:  Isopropyl alcohol Anesthesia: the lesion was anesthetized in a standard fashion   Anesthetic:  1% lidocaine  w/ epinephrine  1-100,000 buffered w/ 8.4% NaHCO3 Instrument used: DermaBlade   Hemostasis achieved with: pressure, aluminum chloride and electrodesiccation   Outcome: patient tolerated procedure well   Post-procedure details: sterile  dressing applied and wound care instructions given   Dressing type: bandage and petrolatum   Specimen 1 - Surgical pathology Differential Diagnosis: R/O BCC  Check Margins: No VIRAL WARTS, UNSPECIFIED TYPE (2) Head - Anterior (Face), Right Mid Palm Destruction of lesion - Head - Anterior (Face), Right Mid Palm Complexity: simple   Destruction method: cryotherapy   Informed consent: discussed and consent obtained   Timeout:  patient name, date of birth, surgical site, and procedure verified Lesion destroyed using liquid nitrogen: Yes   Region frozen until ice ball extended beyond lesion: Yes   Outcome: patient tolerated procedure well with no complications   Post-procedure details: wound care instructions given    Return in about 2 months (around 09/23/2023).  Exie Holler, CMA, am acting as scribe for [employer], DO.   Documentation: I have reviewed the above documentation for accuracy and completeness, and I agree with the above.  Louana Roup, DO

## 2023-07-26 ENCOUNTER — Other Ambulatory Visit: Payer: Self-pay

## 2023-07-26 MED ORDER — HYDROCORTISONE 2.5 % EX OINT: TOPICAL_OINTMENT | Freq: Two times a day (BID) | CUTANEOUS | 1 refills | Status: DC

## 2023-07-30 LAB — SURGICAL PATHOLOGY

## 2023-08-06 DIAGNOSIS — J449 Chronic obstructive pulmonary disease, unspecified: Secondary | ICD-10-CM | POA: Diagnosis not present

## 2023-08-06 DIAGNOSIS — D099 Carcinoma in situ, unspecified: Secondary | ICD-10-CM

## 2023-08-06 HISTORY — DX: Carcinoma in situ, unspecified: D09.9

## 2023-08-06 NOTE — Progress Notes (Signed)
 Hi [name],  Please call patient and review results and refer for Mohs with Dr Paci for positive skin cancer(s)  Thanks!  Diagnosis Skin , head - anterior (face)  --> Mohs w. Dr. Arlester Ladd  b/c base involvement suggests its not in situ. SQUAMOUS CELL CARCINOMA IN SITU, BASE INVOLVED

## 2023-08-09 DIAGNOSIS — Z9189 Other specified personal risk factors, not elsewhere classified: Secondary | ICD-10-CM | POA: Diagnosis not present

## 2023-08-09 DIAGNOSIS — M25561 Pain in right knee: Secondary | ICD-10-CM | POA: Diagnosis not present

## 2023-08-09 DIAGNOSIS — M5459 Other low back pain: Secondary | ICD-10-CM | POA: Diagnosis not present

## 2023-08-09 DIAGNOSIS — M25562 Pain in left knee: Secondary | ICD-10-CM | POA: Diagnosis not present

## 2023-08-09 DIAGNOSIS — G894 Chronic pain syndrome: Secondary | ICD-10-CM | POA: Diagnosis not present

## 2023-08-09 DIAGNOSIS — M5412 Radiculopathy, cervical region: Secondary | ICD-10-CM | POA: Diagnosis not present

## 2023-08-09 DIAGNOSIS — G8929 Other chronic pain: Secondary | ICD-10-CM | POA: Diagnosis not present

## 2023-08-18 ENCOUNTER — Emergency Department (HOSPITAL_BASED_OUTPATIENT_CLINIC_OR_DEPARTMENT_OTHER): Admitting: Radiology

## 2023-08-18 ENCOUNTER — Inpatient Hospital Stay (HOSPITAL_BASED_OUTPATIENT_CLINIC_OR_DEPARTMENT_OTHER)
Admission: EM | Admit: 2023-08-18 | Discharge: 2023-08-20 | DRG: 190 | Disposition: A | Discharge status: Home / Self Care | Attending: Internal Medicine | Admitting: Internal Medicine

## 2023-08-18 ENCOUNTER — Other Ambulatory Visit: Payer: Self-pay

## 2023-08-18 ENCOUNTER — Encounter (HOSPITAL_BASED_OUTPATIENT_CLINIC_OR_DEPARTMENT_OTHER): Payer: Self-pay | Admitting: Emergency Medicine

## 2023-08-18 DIAGNOSIS — I3139 Other pericardial effusion (noninflammatory): Secondary | ICD-10-CM | POA: Diagnosis present

## 2023-08-18 DIAGNOSIS — Z9981 Dependence on supplemental oxygen: Secondary | ICD-10-CM

## 2023-08-18 DIAGNOSIS — Z860101 Personal history of adenomatous and serrated colon polyps: Secondary | ICD-10-CM

## 2023-08-18 DIAGNOSIS — G8929 Other chronic pain: Secondary | ICD-10-CM | POA: Diagnosis present

## 2023-08-18 DIAGNOSIS — Z9049 Acquired absence of other specified parts of digestive tract: Secondary | ICD-10-CM

## 2023-08-18 DIAGNOSIS — M5030 Other cervical disc degeneration, unspecified cervical region: Secondary | ICD-10-CM | POA: Diagnosis present

## 2023-08-18 DIAGNOSIS — J9621 Acute and chronic respiratory failure with hypoxia: Secondary | ICD-10-CM | POA: Diagnosis present

## 2023-08-18 DIAGNOSIS — Z8249 Family history of ischemic heart disease and other diseases of the circulatory system: Secondary | ICD-10-CM

## 2023-08-18 DIAGNOSIS — Z885 Allergy status to narcotic agent status: Secondary | ICD-10-CM | POA: Diagnosis not present

## 2023-08-18 DIAGNOSIS — J449 Chronic obstructive pulmonary disease, unspecified: Principal | ICD-10-CM | POA: Diagnosis present

## 2023-08-18 DIAGNOSIS — M545 Low back pain, unspecified: Secondary | ICD-10-CM | POA: Diagnosis present

## 2023-08-18 DIAGNOSIS — Z880 Allergy status to penicillin: Secondary | ICD-10-CM

## 2023-08-18 DIAGNOSIS — Z90710 Acquired absence of both cervix and uterus: Secondary | ICD-10-CM

## 2023-08-18 DIAGNOSIS — Z86008 Personal history of in-situ neoplasm of other site: Secondary | ICD-10-CM

## 2023-08-18 DIAGNOSIS — I10 Essential (primary) hypertension: Secondary | ICD-10-CM | POA: Diagnosis present

## 2023-08-18 DIAGNOSIS — I509 Heart failure, unspecified: Secondary | ICD-10-CM | POA: Diagnosis not present

## 2023-08-18 DIAGNOSIS — Z716 Tobacco abuse counseling: Secondary | ICD-10-CM

## 2023-08-18 DIAGNOSIS — Z79899 Other long term (current) drug therapy: Secondary | ICD-10-CM | POA: Diagnosis not present

## 2023-08-18 DIAGNOSIS — F17200 Nicotine dependence, unspecified, uncomplicated: Secondary | ICD-10-CM | POA: Diagnosis present

## 2023-08-18 DIAGNOSIS — N179 Acute kidney failure, unspecified: Secondary | ICD-10-CM | POA: Diagnosis present

## 2023-08-18 DIAGNOSIS — J9601 Acute respiratory failure with hypoxia: Secondary | ICD-10-CM | POA: Diagnosis not present

## 2023-08-18 DIAGNOSIS — R5383 Other fatigue: Secondary | ICD-10-CM | POA: Diagnosis present

## 2023-08-18 DIAGNOSIS — R0902 Hypoxemia: Secondary | ICD-10-CM

## 2023-08-18 DIAGNOSIS — F17210 Nicotine dependence, cigarettes, uncomplicated: Secondary | ICD-10-CM | POA: Diagnosis present

## 2023-08-18 DIAGNOSIS — F411 Generalized anxiety disorder: Secondary | ICD-10-CM | POA: Diagnosis present

## 2023-08-18 DIAGNOSIS — J441 Chronic obstructive pulmonary disease with (acute) exacerbation: Principal | ICD-10-CM | POA: Diagnosis present

## 2023-08-18 DIAGNOSIS — Z841 Family history of disorders of kidney and ureter: Secondary | ICD-10-CM | POA: Diagnosis not present

## 2023-08-18 DIAGNOSIS — Z888 Allergy status to other drugs, medicaments and biological substances status: Secondary | ICD-10-CM | POA: Diagnosis not present

## 2023-08-18 DIAGNOSIS — F319 Bipolar disorder, unspecified: Secondary | ICD-10-CM | POA: Diagnosis present

## 2023-08-18 DIAGNOSIS — R059 Cough, unspecified: Secondary | ICD-10-CM | POA: Diagnosis not present

## 2023-08-18 DIAGNOSIS — I110 Hypertensive heart disease with heart failure: Secondary | ICD-10-CM | POA: Diagnosis not present

## 2023-08-18 DIAGNOSIS — F329 Major depressive disorder, single episode, unspecified: Secondary | ICD-10-CM | POA: Diagnosis present

## 2023-08-18 DIAGNOSIS — G894 Chronic pain syndrome: Secondary | ICD-10-CM | POA: Diagnosis present

## 2023-08-18 LAB — BASIC METABOLIC PANEL WITH GFR
Anion gap: 11 (ref 5–15)
BUN: 16 mg/dL (ref 6–20)
CO2: 31 mmol/L (ref 22–32)
Calcium: 11.1 mg/dL — ABNORMAL HIGH (ref 8.9–10.3)
Chloride: 96 mmol/L — ABNORMAL LOW (ref 98–111)
Creatinine, Ser: 1.93 mg/dL — ABNORMAL HIGH (ref 0.44–1.00)
GFR, Estimated: 31 mL/min — ABNORMAL LOW (ref >60)
Glucose, Bld: 136 mg/dL — ABNORMAL HIGH (ref 70–99)
Potassium: 4.1 mmol/L (ref 3.5–5.1)
Sodium: 138 mmol/L (ref 135–145)

## 2023-08-18 LAB — CBC
HCT: 41.5 % (ref 36.0–46.0)
Hemoglobin: 13.9 g/dL (ref 12.0–15.0)
MCH: 33.3 pg (ref 26.0–34.0)
MCHC: 33.5 g/dL (ref 30.0–36.0)
MCV: 99.3 fL (ref 80.0–100.0)
Platelets: 193 10*3/uL (ref 150–400)
RBC: 4.18 MIL/uL (ref 3.87–5.11)
RDW: 13.6 % (ref 11.5–15.5)
WBC: 9.6 10*3/uL (ref 4.0–10.5)
nRBC: 0 % (ref 0.0–0.2)

## 2023-08-18 LAB — URINALYSIS, ROUTINE W REFLEX MICROSCOPIC
Bacteria, UA: NONE SEEN
Bilirubin Urine: NEGATIVE
Glucose, UA: NEGATIVE mg/dL
Hgb urine dipstick: NEGATIVE
Ketones, ur: NEGATIVE mg/dL
Leukocytes,Ua: NEGATIVE
Nitrite: NEGATIVE
Protein, ur: 30 mg/dL — AB
Specific Gravity, Urine: 1.017 (ref 1.005–1.030)
pH: 5.5 (ref 5.0–8.0)

## 2023-08-18 LAB — PRO BRAIN NATRIURETIC PEPTIDE: Pro Brain Natriuretic Peptide: 746 pg/mL — ABNORMAL HIGH (ref <300.0)

## 2023-08-18 LAB — CBC WITH DIFFERENTIAL/PLATELET
Abs Immature Granulocytes: 0.09 10*3/uL — ABNORMAL HIGH (ref 0.00–0.07)
Basophils Absolute: 0 10*3/uL (ref 0.0–0.1)
Basophils Relative: 0 %
Eosinophils Absolute: 0.1 10*3/uL (ref 0.0–0.5)
Eosinophils Relative: 1 %
HCT: 39.5 % (ref 36.0–46.0)
Hemoglobin: 13.2 g/dL (ref 12.0–15.0)
Immature Granulocytes: 1 %
Lymphocytes Relative: 26 %
Lymphs Abs: 2.9 10*3/uL (ref 0.7–4.0)
MCH: 33 pg (ref 26.0–34.0)
MCHC: 33.4 g/dL (ref 30.0–36.0)
MCV: 98.8 fL (ref 80.0–100.0)
Monocytes Absolute: 0.8 10*3/uL (ref 0.1–1.0)
Monocytes Relative: 7 %
Neutro Abs: 7.1 10*3/uL (ref 1.7–7.7)
Neutrophils Relative %: 65 %
Platelets: 202 10*3/uL (ref 150–400)
RBC: 4 MIL/uL (ref 3.87–5.11)
RDW: 13.5 % (ref 11.5–15.5)
WBC: 11.1 10*3/uL — ABNORMAL HIGH (ref 4.0–10.5)
nRBC: 0 % (ref 0.0–0.2)

## 2023-08-18 LAB — RESP PANEL BY RT-PCR (RSV, FLU A&B, COVID)  RVPGX2
Influenza A by PCR: NEGATIVE
Influenza B by PCR: NEGATIVE
Resp Syncytial Virus by PCR: NEGATIVE
SARS Coronavirus 2 by RT PCR: NEGATIVE

## 2023-08-18 LAB — CREATININE, SERUM
Creatinine, Ser: 2.05 mg/dL — ABNORMAL HIGH (ref 0.44–1.00)
GFR, Estimated: 29 mL/min — ABNORMAL LOW (ref >60)

## 2023-08-18 LAB — HIV ANTIBODY (ROUTINE TESTING W REFLEX): HIV Screen 4th Generation wRfx: NONREACTIVE

## 2023-08-18 MED ORDER — DOXYCYCLINE HYCLATE 100 MG PO TABS
100.0000 mg | ORAL_TABLET | Freq: Two times a day (BID) | ORAL | Status: DC
  Administered 2023-08-18 – 2023-08-20 (×4): 100 mg via ORAL
  Filled 2023-08-18 (×4): qty 1

## 2023-08-18 MED ORDER — OXYCODONE HCL 5 MG PO TABS
5.0000 mg | ORAL_TABLET | Freq: Four times a day (QID) | ORAL | Status: DC | PRN
  Administered 2023-08-19 – 2023-08-20 (×5): 5 mg via ORAL
  Filled 2023-08-18 (×5): qty 1

## 2023-08-18 MED ORDER — SODIUM CHLORIDE 0.9 % IV SOLN
500.0000 mg | Freq: Once | INTRAVENOUS | Status: AC
  Administered 2023-08-18: 500 mg via INTRAVENOUS
  Filled 2023-08-18: qty 5

## 2023-08-18 MED ORDER — IPRATROPIUM BROMIDE 0.02 % IN SOLN
0.5000 mg | Freq: Four times a day (QID) | RESPIRATORY_TRACT | Status: DC
  Administered 2023-08-19 (×2): 0.5 mg via RESPIRATORY_TRACT
  Filled 2023-08-18 (×2): qty 2.5

## 2023-08-18 MED ORDER — OXYCODONE-ACETAMINOPHEN 10-325 MG PO TABS: 1.0000 | ORAL_TABLET | Freq: Four times a day (QID) | ORAL | Status: DC | PRN

## 2023-08-18 MED ORDER — ENOXAPARIN SODIUM 30 MG/0.3ML IJ SOSY
30.0000 mg | PREFILLED_SYRINGE | INTRAMUSCULAR | Status: DC
  Administered 2023-08-18 – 2023-08-19 (×2): 30 mg via SUBCUTANEOUS
  Filled 2023-08-18 (×2): qty 0.3

## 2023-08-18 MED ORDER — METHYLPREDNISOLONE SODIUM SUCC 125 MG IJ SOLR
125.0000 mg | Freq: Every day | INTRAMUSCULAR | Status: DC
  Administered 2023-08-18 – 2023-08-20 (×3): 125 mg via INTRAVENOUS
  Filled 2023-08-18 (×3): qty 2

## 2023-08-18 MED ORDER — ACETAMINOPHEN 325 MG PO TABS: 650.0000 mg | ORAL_TABLET | Freq: Four times a day (QID) | ORAL | Status: DC | PRN

## 2023-08-18 MED ORDER — ALPRAZOLAM 0.5 MG PO TABS
1.0000 mg | ORAL_TABLET | Freq: Four times a day (QID) | ORAL | Status: DC | PRN
  Administered 2023-08-19 (×4): 1 mg via ORAL
  Filled 2023-08-18 (×4): qty 2

## 2023-08-18 MED ORDER — QUETIAPINE FUMARATE 200 MG PO TABS
800.0000 mg | ORAL_TABLET | Freq: Every day | ORAL | Status: DC
  Administered 2023-08-18 – 2023-08-19 (×2): 800 mg via ORAL
  Filled 2023-08-18: qty 8
  Filled 2023-08-18: qty 4
  Filled 2023-08-18: qty 8
  Filled 2023-08-18: qty 4

## 2023-08-18 MED ORDER — NICOTINE 21 MG/24HR TD PT24
21.0000 mg | MEDICATED_PATCH | Freq: Every day | TRANSDERMAL | Status: DC
  Administered 2023-08-18 – 2023-08-20 (×3): 21 mg via TRANSDERMAL
  Filled 2023-08-18 (×3): qty 1

## 2023-08-18 MED ORDER — PANTOPRAZOLE SODIUM 40 MG PO TBEC
40.0000 mg | DELAYED_RELEASE_TABLET | Freq: Every day | ORAL | Status: DC
  Administered 2023-08-19 – 2023-08-20 (×2): 40 mg via ORAL
  Filled 2023-08-18 (×2): qty 1

## 2023-08-18 MED ORDER — ONDANSETRON HCL 4 MG/2ML IJ SOLN
4.0000 mg | Freq: Four times a day (QID) | INTRAMUSCULAR | Status: DC | PRN
Start: 2023-08-18 — End: 2023-08-20
  Filled 2023-08-18: qty 2

## 2023-08-18 MED ORDER — OXYCODONE-ACETAMINOPHEN 5-325 MG PO TABS
1.0000 | ORAL_TABLET | Freq: Four times a day (QID) | ORAL | Status: DC | PRN
  Administered 2023-08-18 – 2023-08-20 (×6): 1 via ORAL
  Filled 2023-08-18 (×6): qty 1

## 2023-08-18 MED ORDER — IPRATROPIUM-ALBUTEROL 0.5-2.5 (3) MG/3ML IN SOLN
3.0000 mL | Freq: Once | RESPIRATORY_TRACT | Status: AC
  Administered 2023-08-18: 3 mL via RESPIRATORY_TRACT
  Filled 2023-08-18: qty 3

## 2023-08-18 MED ORDER — ALBUTEROL SULFATE (2.5 MG/3ML) 0.083% IN NEBU
2.5000 mg | INHALATION_SOLUTION | Freq: Four times a day (QID) | RESPIRATORY_TRACT | Status: DC
  Administered 2023-08-19 (×2): 2.5 mg via RESPIRATORY_TRACT
  Filled 2023-08-18 (×2): qty 3

## 2023-08-18 MED ORDER — GUAIFENESIN ER 600 MG PO TB12
600.0000 mg | ORAL_TABLET | Freq: Two times a day (BID) | ORAL | Status: DC
  Administered 2023-08-18 – 2023-08-20 (×4): 600 mg via ORAL
  Filled 2023-08-18 (×4): qty 1

## 2023-08-18 MED ORDER — ACETAMINOPHEN 650 MG RE SUPP: 650.0000 mg | Freq: Four times a day (QID) | RECTAL | Status: DC | PRN

## 2023-08-18 MED ORDER — LORATADINE 10 MG PO TABS
10.0000 mg | ORAL_TABLET | Freq: Every day | ORAL | Status: DC
  Administered 2023-08-19 – 2023-08-20 (×2): 10 mg via ORAL
  Filled 2023-08-18 (×2): qty 1

## 2023-08-18 MED ORDER — ALBUTEROL SULFATE (2.5 MG/3ML) 0.083% IN NEBU: 2.5000 mg | INHALATION_SOLUTION | RESPIRATORY_TRACT | Status: DC | PRN

## 2023-08-18 MED ORDER — METHYLPREDNISOLONE SODIUM SUCC 125 MG IJ SOLR
80.0000 mg | INTRAMUSCULAR | Status: DC
  Administered 2023-08-18 – 2023-08-19 (×2): 80 mg via INTRAVENOUS
  Filled 2023-08-18 (×2): qty 2

## 2023-08-18 MED ORDER — FUROSEMIDE 10 MG/ML IJ SOLN
20.0000 mg | Freq: Two times a day (BID) | INTRAMUSCULAR | Status: DC
  Administered 2023-08-18 – 2023-08-19 (×2): 20 mg via INTRAVENOUS
  Filled 2023-08-18 (×2): qty 2

## 2023-08-18 MED ORDER — ONDANSETRON HCL 4 MG PO TABS: 4.0000 mg | ORAL_TABLET | Freq: Four times a day (QID) | ORAL | Status: DC | PRN

## 2023-08-18 MED ORDER — BACLOFEN 10 MG PO TABS: 10.0000 mg | ORAL_TABLET | Freq: Three times a day (TID) | ORAL | Status: DC | PRN

## 2023-08-18 MED ORDER — NICOTINE 21 MG/24HR TD PT24: 21.0000 mg | MEDICATED_PATCH | Freq: Every day | TRANSDERMAL | Status: DC

## 2023-08-18 NOTE — ED Notes (Signed)
 Lab contacted to run urine and additional blood work

## 2023-08-18 NOTE — ED Triage Notes (Signed)
 Pt presents with " feel like I'm talking in a tunnel . " Very tired /fatigue. Sats 84 in triage, states she usually wears oxygen  at night. Has had night sweats as well.

## 2023-08-18 NOTE — ED Triage Notes (Signed)
 Pt sats 79 on room air after transporting to room in a wheelchair and assisting to stretcher. RT in room and oxygen  nasal cannula applied

## 2023-08-18 NOTE — ED Notes (Signed)
 Report given to [facility].

## 2023-08-18 NOTE — ED Triage Notes (Signed)
 Pt also states she has fluid around her heart, no treatment, hasn't followed up

## 2023-08-18 NOTE — Progress Notes (Addendum)
 Plan of Care Note for accepted transfer   Patient: [name] MRN: [id number]    DOA: 08/18/2023  [facility]ility requesting transfer: [facility] Requesting Provider: Dr. Florie Husband  Reason for transfer: COPD exacerbation/AKI  Facility course: 48 year old with past medical history significant for chronic pain, asthma/COPD, bipolar 1, HTN, GAD and depression, IBS, tobacco use who presented to ED with fatigue x 1 week and shortness of breath. She also reports fever, cough. EDP reports wheezing and POCUS showed diminished EF.   Vitals:  afebrile, bp: 98/80, HR: 108, RR: 16, oxygen : 84%RA>3LNC 98% Pertinent labs: wbc: 11.1, creatinine: 1.93, calcium: 11.1, bnp: 746,  CXR: no acute finding  EKG: sinus tachy rate of 100  In ED: given steroids, duoneb and azithromycin  for COPD exacerbation.  Also concern for possible new CHF/AKI   Plan of care: The patient is accepted for admission to Telemetry unit, at [facility].or [facility] for new acute respiratory failure with hypoxia likely from COPD exacerbation, +/- new CHF exacerbation and AKI on CKD.    Author: Raymona Caldwell, MD 08/18/2023  Check [email or web address] for on-call coverage.  Nursing staff, Please call [facility] Admits & Consults System-Wide number on Amion as soon as patient's arrival, so appropriate admitting provider can evaluate the pt.

## 2023-08-18 NOTE — ED Notes (Signed)
 Called [facility] for transport, pt bed assignment is ready

## 2023-08-18 NOTE — ED Notes (Signed)
 Taken to room 1

## 2023-08-18 NOTE — ED Notes (Signed)
 Report attempted to the floor x2... [phone].Aaron AasAaron Aas

## 2023-08-18 NOTE — ED Provider Notes (Signed)
 [facility] Provider Note  CSN: [id number] Arrival date & time: 08/18/23 1611  Chief Complaint(s) Fatigue  HPI [name] is a 48 y.o. female who is here today for fatigue.  Patient is felt short of breath, has had a cough and had a fever.   Past Medical History Past Medical History:  Diagnosis Date   Anemia 08/19/2018   Asthma    Bipolar 1 disorder    Chronic low back pain 04/25/2017   Chronic neck pain 04/25/2017   Chronic obstructive pulmonary disease 11/28/2019   Chronic pain syndrome 03/05/2016   DDD (degenerative disc disease), cervical 05/27/2017   Dysuria 04/30/2020   Enlarged lymph node 05/30/2017   Seen on CTA- reactive vs sarcoidosis.   Essential hypertension 09/03/2015   Generalized anxiety disorder    H. 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 Surveillance colonoscopy in 2021 negative. Repeat next surveillance colonoscopy due in 5 years (2026).   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   Abnormal finding of diagnostic imaging 12/22/2020   Angular cheilitis with candidiasis 08/24/2020   Thyroid  disorder 07/15/2020   Weight gain 07/15/2020   Subacute frontal sinusitis 07/13/2020   Generalized anxiety disorder    Asthma    Migraine  headaches    Knee pain, bilateral 07/01/2020   Trochanteric bursitis of right hip 07/01/2020   Dysuria 04/30/2020   Presence of auditory hallucinations 12/14/2019   Chronic obstructive pulmonary disease 11/28/2019   Primary insomnia 11/28/2019   Major depressive disorder 11/13/2018   Anemia 08/19/2018   Hypokalemia 08/19/2018   Enlarged lymph node 05/30/2017   DDD (degenerative disc disease), cervical 05/27/2017   Long-term current use of opiate analgesic 04/25/2017   Chronic low back pain 04/25/2017   Chronic neck pain 04/25/2017   Nocturnal hypoxia 08/10/2016   Tubular adenoma of colon 03/26/2016   Chronic pain syndrome 03/05/2016   Essential hypertension 09/03/2015   Irritable bowel syndrome without diarrhea 10/15/2014   Tobacco dependence 01/27/2013   Hot flashes not due to menopause 10/14/2012   Pelvic and perineal pain 06/17/2012   Bipolar 1 disorder 05/15/2012   Rectal prolapse 05/15/2012   Home Medication(s) Prior to Admission medications   Medication Sig Start Date End Date Taking? Authorizing Provider  ALPRAZolam  (XANAX ) 1 MG tablet Take 1 mg by mouth 4 (four) times daily as needed. 09/09/19  Yes [provider]  baclofen  (LIORESAL ) 10 MG tablet TK 1 T PO TID PRN 11/28/18  Yes [provider]  desoximetasone  (TOPICORT ) 0.25 % cream Apply 1 Application topically 2 (two) times daily. 06/12/23  Yes Martin, Mary-Margaret, FNP  fluPHENAZine (PROLIXIN) 2.5 MG tablet Take 2.5 mg by mouth at bedtime.   Yes [provider]  hydrocortisone  2.5 % ointment Apply  topically 2 (two) times daily. Apply to corners of mouth twice daily for 2 weeks as needed. 07/26/23  Yes Dellar Fenton, DO  oxyCODONE -acetaminophen  (PERCOCET) 10-325 MG tablet Take 1 tablet by mouth 4 (four) times daily as needed. 12/06/21  Yes [provider]  QUEtiapine  (SEROQUEL ) 400 MG tablet Take 800 mg by mouth at bedtime. 12/25/19  Yes [provider]  Calcium Polycarbophil  (FIBER-CAPS PO) Take 1 capsule by mouth daily.    [provider]  cetirizine  (ZYRTEC  ALLERGY) 10 MG tablet Take 1 tablet (10 mg total) by mouth daily. 12/27/19   Kehrli, Kelsey F, PA-C  ciclopirox  (PENLAC ) 8 % solution Apply topically at bedtime. Apply over nail and surrounding skin. Apply daily over previous coat. After seven (7) days, may remove with alcohol and continue cycle. 12/07/21   Standiford, Karlene Overcast, DPM  fluticasone  (FLONASE ) 50 MCG/ACT nasal spray Place 2 sprays into both nostrils daily. 02/06/22   Gaylyn Keas Mary-Margaret, FNP  Galcanezumab -gnlm (EMGALITY ) 120 MG/ML SOAJ Inject 120 mg into the skin every 28 (twenty-eight) days. 06/08/20   Merriam Abbey, DO  linaclotide  (LINZESS ) 145 MCG CAPS capsule Take 1 capsule (145 mcg total) by mouth daily before breakfast. 01/30/20   Gaylyn Keas, Mary-Margaret, FNP  naloxone  (NARCAN ) nasal spray 4 mg/0.1 mL     [provider]  omeprazole  (PRILOSEC) 40 MG capsule Take by mouth. 12/13/20   [provider]  promethazine  (PHENERGAN ) 25 MG tablet Take 1 tablet (25 mg total) by mouth every 6 (six) hours as needed for nausea or vomiting. 06/17/19   Eliodoro Guerin, DO  rizatriptan  (MAXALT ) 10 MG tablet TAKE 1 TABLET BY MOUTH AS NEEDED FOR MIGRAINE-MAY REPEAT IN 2 HRS IF NEEDED-MAX 2/24HRS 08/04/21   Gaylyn Keas, Mary-Margaret, FNP  sodium chloride  (OCEAN) 0.65 % SOLN nasal spray Place 1 spray into both nostrils as needed for congestion. 07/13/20   Ijaola, Onyeje M, NP  vortioxetine  HBr (TRINTELLIX ) 20 MG TABS tablet Take 1 tablet (20 mg total) by mouth daily. 11/16/18   Suzi Essex, MD                                                                                                                                    Past Surgical History Past Surgical History:  Procedure Laterality Date   ABDOMINAL HYSTERECTOMY     BLADDER SURGERY  2015   CHOLECYSTECTOMY     Family History Family History  Problem Relation Age of Onset   Renal Disease  Mother    Heart attack Father    Thyroid  disease Neg Hx     Social History Social History   Tobacco Use   Smoking status: Every Day    Current packs/day: 1.00    Types: Cigarettes   Smokeless tobacco: Never  Vaping Use   Vaping status: Never Used  Substance Use Topics   Alcohol use: No   Drug use: No   Allergies  Amoxicillin, Chantix [varenicline], Codeine, Divalproex sodium, Lithium, Oxycodone -acetaminophen , Propoxyphene, Other, Penicillins, Augmentin [amoxicillin-pot clavulanate], Prednisone , and Zolpidem  Review of Systems Review of Systems  Physical Exam Vital Signs  I have reviewed the triage vital signs BP 110/68 (BP Location: Left Arm)   Pulse 98   Temp 98.2 F (36.8 C)   Resp 16   Ht 5' (1.524 m)   Wt 61.4 kg   SpO2 93%   BMI 26.44 kg/m   Physical Exam Vitals reviewed.  Cardiovascular:     Rate and Rhythm: Tachycardia present.  Pulmonary:     Comments: Tachypneic, wheezing on the left side worse than right Neurological:     General: No focal deficit present.     Mental Status: She is alert.     ED Results and Treatments Labs (all labs ordered are listed, but only abnormal results are displayed) Labs Reviewed  BASIC METABOLIC PANEL WITH GFR - Abnormal; Notable for the following components:      Result Value   Chloride 96 (*)    Glucose, Bld 136 (*)    Creatinine, Ser 1.93 (*)    Calcium 11.1 (*)    GFR, Estimated 31 (*)    All other components within normal limits  CBC WITH DIFFERENTIAL/PLATELET - Abnormal; Notable for the following components:   WBC 11.1 (*)    Abs Immature Granulocytes 0.09 (*)    All other components within normal limits  PRO BRAIN NATRIURETIC PEPTIDE - Abnormal; Notable for the following components:   Pro Brain Natriuretic Peptide 746.0 (*)    All other components within normal limits  URINALYSIS, ROUTINE W REFLEX MICROSCOPIC - Abnormal; Notable for the following components:   Protein, ur 30 (*)    All other components  within normal limits  CREATININE, SERUM - Abnormal; Notable for the following components:   Creatinine, Ser 2.05 (*)    GFR, Estimated 29 (*)    All other components within normal limits  RESP PANEL BY RT-PCR (RSV, FLU A&B, COVID)  RVPGX2  HIV ANTIBODY (ROUTINE TESTING W REFLEX)  CBC  PTH, INTACT AND CALCIUM  TSH  MAGNESIUM   SODIUM, URINE, RANDOM  CREATININE, URINE, RANDOM  CBC  COMPREHENSIVE METABOLIC PANEL WITH GFR                                                                                                                          Radiology DG Chest 2 View Result Date: 08/18/2023 CLINICAL DATA:  Cough, generalized fatigue, low O2 sats. Shortness of breath. EXAM: CHEST - 2 VIEW COMPARISON:  11/09/2019 FINDINGS: The heart size and mediastinal contours are within normal limits. Both lungs are clear. The visualized skeletal structures are unremarkable. IMPRESSION: No active cardiopulmonary disease. Electronically Signed   By: Janeece Mechanic M.D.   On: 08/18/2023 17:29    Pertinent labs & imaging results that were available during my care of the patient were reviewed by me and considered in my medical decision making (see MDM  for details).  Medications Ordered in ED Medications  methylPREDNISolone sodium succinate (SOLU-MEDROL) 125 mg/2 mL injection 125 mg (125 mg Intravenous Given 08/18/23 1804)  albuterol  (PROVENTIL ) (2.5 MG/3ML) 0.083% nebulizer solution 2.5 mg (has no administration in time range)  loratadine  (CLARITIN ) tablet 10 mg (has no administration in time range)  ALPRAZolam  (XANAX ) tablet 1 mg (has no administration in time range)  QUEtiapine  (SEROQUEL ) tablet 800 mg (800 mg Oral Given 08/18/23 2255)  baclofen  (LIORESAL ) tablet 10 mg (has no administration in time range)  pantoprazole  (PROTONIX ) EC tablet 40 mg (has no administration in time range)  enoxaparin  (LOVENOX ) injection 30 mg (30 mg Subcutaneous Given 08/18/23 2255)  acetaminophen  (TYLENOL ) tablet 650 mg (has no  administration in time range)    Or  acetaminophen  (TYLENOL ) suppository 650 mg (has no administration in time range)  ondansetron  (ZOFRAN ) tablet 4 mg (has no administration in time range)    Or  ondansetron  (ZOFRAN ) injection 4 mg (has no administration in time range)  albuterol  (PROVENTIL ) (2.5 MG/3ML) 0.083% nebulizer solution 2.5 mg (has no administration in time range)  ipratropium (ATROVENT ) nebulizer solution 0.5 mg (has no administration in time range)  guaiFENesin  (MUCINEX ) 12 hr tablet 600 mg (600 mg Oral Given 08/18/23 2255)  methylPREDNISolone sodium succinate (SOLU-MEDROL) 125 mg/2 mL injection 80 mg (80 mg Intravenous Given 08/18/23 2254)  doxycycline  (VIBRA -TABS) tablet 100 mg (100 mg Oral Given 08/18/23 2255)  furosemide (LASIX) injection 20 mg (20 mg Intravenous Given 08/18/23 2259)  oxyCODONE -acetaminophen  (PERCOCET/ROXICET) 5-325 MG per tablet 1 tablet (1 tablet Oral Given 08/18/23 2256)    And  oxyCODONE  (Oxy IR/ROXICODONE ) immediate release tablet 5 mg (has no administration in time range)  nicotine  (NICODERM CQ  - dosed in mg/24 hours) patch 21 mg (21 mg Transdermal Patch Applied 08/18/23 2305)  ipratropium-albuterol  (DUONEB) 0.5-2.5 (3) MG/3ML nebulizer solution 3 mL (3 mLs Nebulization Given 08/18/23 1723)  azithromycin  (ZITHROMAX ) 500 mg in sodium chloride  0.9 % 250 mL IVPB (0 mg Intravenous Stopped 08/18/23 1854)                                                                                                                                     Procedures Ultrasound ED Echo  Date/Time: 08/18/2023 5:08 PM  Performed by: Nathanael Baker, DO Authorized by: Nathanael Baker, DO   Procedure details:    Indications: dyspnea     Views: parasternal long axis view and parasternal short axis view     Images: archived     Limitations:  Acoustic shadowing Findings:    Pericardium: no pericardial effusion     LV Function: depressed (30 - 50%)     RV Diameter: normal    Impression:    Impression: decreased contractility   .Critical Care  Performed by: Nathanael Baker, DO Authorized by: Nathanael Baker, DO   Critical care provider statement:    Critical care time (minutes):  33   Critical care was necessary to treat or prevent imminent or life-threatening deterioration of the following conditions:  Respiratory failure   Critical care was time spent personally by me on the following activities:  Development of treatment plan with patient or surrogate, discussions with consultants, evaluation of patient's response to treatment, examination of patient, ordering and review of laboratory studies, ordering and review of radiographic studies, ordering and performing treatments and interventions, pulse oximetry, re-evaluation of patient's condition and review of old charts   (including critical care time)  Medical Decision Making / ED Course   This patient presents to the ED for concern of fatigue, this involves an extensive number of treatment options, and is a complaint that carries with it a high risk of complications and morbidity.  The differential diagnosis includes pneumonia, hypoxia, COPD, less likely PE less likely pericardial effusion.  MDM: Patient with wheezing over the left side, has had a cough.  Believe this is likely COPD exacerbation versus pneumonia.  Will start the patient on some albuterol , obtain images and blood work.  On arrival to the ED, patient was noted to be hypoxic.  She had told triage nursing about "some fluid around my heart."  She says that this was about a year ago.  I did a bedside ultrasound I did not see any pericardial effusion.  Patient smokes 1 pack/day.  We discussed smoking cessation.   Additional history obtained: -Additional history obtained from  -External records from outside source obtained and reviewed including: Chart review including previous notes, labs, imaging, consultation notes   Lab Tests: -I  ordered, reviewed, and interpreted labs.   The pertinent results include:   Labs Reviewed  BASIC METABOLIC PANEL WITH GFR - Abnormal; Notable for the following components:      Result Value   Chloride 96 (*)    Glucose, Bld 136 (*)    Creatinine, Ser 1.93 (*)    Calcium 11.1 (*)    GFR, Estimated 31 (*)    All other components within normal limits  CBC WITH DIFFERENTIAL/PLATELET - Abnormal; Notable for the following components:   WBC 11.1 (*)    Abs Immature Granulocytes 0.09 (*)    All other components within normal limits  PRO BRAIN NATRIURETIC PEPTIDE - Abnormal; Notable for the following components:   Pro Brain Natriuretic Peptide 746.0 (*)    All other components within normal limits  URINALYSIS, ROUTINE W REFLEX MICROSCOPIC - Abnormal; Notable for the following components:   Protein, ur 30 (*)    All other components within normal limits  CREATININE, SERUM - Abnormal; Notable for the following components:   Creatinine, Ser 2.05 (*)    GFR, Estimated 29 (*)    All other components within normal limits  RESP PANEL BY RT-PCR (RSV, FLU A&B, COVID)  RVPGX2  HIV ANTIBODY (ROUTINE TESTING W REFLEX)  CBC  PTH, INTACT AND CALCIUM  TSH  MAGNESIUM   SODIUM, URINE, RANDOM  CREATININE, URINE, RANDOM  CBC  COMPREHENSIVE METABOLIC PANEL WITH GFR      EKG my independent review of the patient's EKG shows no ST segment depressions or elevations, no T wave inversions, no evidence of acute ischemia.  EKG Interpretation Date/Time:  Saturday Aug 18 2023 16:34:17 EDT Ventricular Rate:  100 PR Interval:  147 QRS Duration:  75 QT Interval:  346 QTC Calculation: 447 R Axis:   101  Text Interpretation: Sinus tachycardia Anteroseptal infarct, age indeterminate Confirmed by Afton Horse [phone]) on 08/18/2023 5:54:44 PM  Imaging Studies ordered: I ordered imaging studies including chest x-ray I independently visualized and interpreted imaging. I agree with the radiologist  interpretation   Medicines ordered and prescription drug management: Meds ordered this encounter  Medications   ipratropium-albuterol  (DUONEB) 0.5-2.5 (3) MG/3ML nebulizer solution 3 mL   methylPREDNISolone sodium succinate (SOLU-MEDROL) 125 mg/2 mL injection 125 mg    IV methylprednisolone will be converted to either a q12h or q24h frequency with the same total daily dose (TDD).  Ordered Dose: 1 to 125 mg TDD; convert to: TDD q24h.  Ordered Dose: 126 to 250 mg TDD; convert to: TDD div q12h.  Ordered Dose: >250 mg TDD; DAW.   azithromycin  (ZITHROMAX ) 500 mg in sodium chloride  0.9 % 250 mL IVPB   albuterol  (PROVENTIL ) (2.5 MG/3ML) 0.083% nebulizer solution 2.5 mg   loratadine  (CLARITIN ) tablet 10 mg   ALPRAZolam  (XANAX ) tablet 1 mg   QUEtiapine  (SEROQUEL ) tablet 800 mg   DISCONTD: oxyCODONE -acetaminophen  (PERCOCET) 10-325 MG per tablet 1 tablet    Refill:  0   baclofen  (LIORESAL ) tablet 10 mg   pantoprazole  (PROTONIX ) EC tablet 40 mg   enoxaparin  (LOVENOX ) injection 30 mg   OR Linked Order Group    acetaminophen  (TYLENOL ) tablet 650 mg    acetaminophen  (TYLENOL ) suppository 650 mg   OR Linked Order Group    ondansetron  (ZOFRAN ) tablet 4 mg    ondansetron  (ZOFRAN ) injection 4 mg   albuterol  (PROVENTIL ) (2.5 MG/3ML) 0.083% nebulizer solution 2.5 mg   ipratropium (ATROVENT ) nebulizer solution 0.5 mg   guaiFENesin  (MUCINEX ) 12 hr tablet 600 mg   methylPREDNISolone sodium succinate (SOLU-MEDROL) 125 mg/2 mL injection 80 mg    IV methylprednisolone will be converted to either a q12h or q24h frequency with the same total daily dose (TDD).  Ordered Dose: 1 to 125 mg TDD; convert to: TDD q24h.  Ordered Dose: 126 to 250 mg TDD; convert to: TDD div q12h.  Ordered Dose: >250 mg TDD; DAW.   doxycycline  (VIBRA -TABS) tablet 100 mg   furosemide (LASIX) injection 20 mg   DISCONTD: nicotine  (NICODERM CQ  - dosed in mg/24 hours) patch 21 mg   AND Linked Order Group    oxyCODONE -acetaminophen   (PERCOCET/ROXICET) 5-325 MG per tablet 1 tablet     Refill:  0    oxyCODONE  (Oxy IR/ROXICODONE ) immediate release tablet 5 mg     Refill:  0   nicotine  (NICODERM CQ  - dosed in mg/24 hours) patch 21 mg    -I have reviewed the patients home medicines and have made adjustments as needed  Critical interventions Management of hypoxia  Cardiac Monitoring: The patient was maintained on a cardiac monitor.  I personally viewed and interpreted the cardiac monitored which showed an underlying rhythm of: Sinus rhythm  Social Determinants of Health:  Factors impacting patients care include: Lack of access to primary care   Reevaluation: After the interventions noted above, I reevaluated the patient and found that they have :improved  Co morbidities that complicate the patient evaluation  Past Medical History:  Diagnosis Date   Anemia 08/19/2018   Asthma    Bipolar 1 disorder    Chronic low back pain 04/25/2017   Chronic neck pain 04/25/2017   Chronic obstructive pulmonary disease 11/28/2019   Chronic pain syndrome 03/05/2016   DDD (degenerative disc disease), cervical 05/27/2017   Dysuria 04/30/2020   Enlarged lymph node 05/30/2017   Seen on CTA- reactive vs sarcoidosis.   Essential hypertension 09/03/2015   Generalized anxiety disorder  H. 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 Surveillance  colonoscopy in 2021 negative. Repeat next surveillance colonoscopy due in 5 years (2026).      Dispostion: Admit     Final Clinical Impression(s) / ED Diagnoses Final diagnoses:  Chronic obstructive pulmonary disease, unspecified COPD type (HCC)  Hypoxia  Heart failure, unspecified HF chronicity, unspecified heart failure type (HCC)     @PCDICTATION @    Afton Horse T, DO 08/19/23 0000

## 2023-08-18 NOTE — H&P (Signed)
 History and Physical    Patient: [name] GNF:[id number] DOB: 1976/01/22 DOA: 08/18/2023 DOS: the patient was seen and examined on 08/18/2023 PCP: Delfina Feller, FNP  Patient coming from: Home  Chief Complaint:  Chief Complaint  Patient presents with   Fatigue   HPI: [name] is a 48 y.o. female with medical history significant of COPD, essential hypertension, asthma, bipolar 1 disorder, generalized anxiety disorder, irritable bowel syndrome, continued tobacco abuse who is on home O2 at 2 L/min presented to [facility] with complaint of fatigue and shortness of breath for 2 days.  Patient reported having fever cough and rhinorrhea.  She has had no dizziness but she feels heaviness in her head.  Patient was seen and evaluated in the ER.  She has acute on chronic hypoxic respiratory failure.  Appears to have COPD with possible CHF exacerbation as well.  Patient has been admitted for further evaluation and treatment.  Review of Systems: As mentioned in the history of present illness. All other systems reviewed and are negative. Past Medical History:  Diagnosis Date   Anemia 08/19/2018   Asthma    Bipolar 1 disorder    Chronic low back pain 04/25/2017   Chronic neck pain 04/25/2017   Chronic obstructive pulmonary disease 11/28/2019   Chronic pain syndrome 03/05/2016   DDD (degenerative disc disease), cervical 05/27/2017   Dysuria 04/30/2020   Enlarged lymph node 05/30/2017   Seen on CTA- reactive vs sarcoidosis.   Essential hypertension 09/03/2015   Generalized anxiety disorder    H. pylori infection    Hot flashes not due to menopause 10/14/2012   Hypokalemia 08/19/2018   Irritable bowel syndrome without diarrhea 10/15/2014   Seen by Alethia Huxley   Knee pain, bilateral 07/01/2020   Major depressive disorder 11/13/2018   Migraine headaches    Nocturnal hypoxia 08/10/2016   Pelvic and perineal pain 06/17/2012   Presence of auditory hallucinations  12/14/2019   Primary insomnia 11/28/2019   Rectal prolapse 05/15/2012   Squamous cell carcinoma in situ (SCCIS) 08/06/2023   Face - MOHS needed    Trochanteric bursitis of right hip 07/01/2020   Tubular adenoma of colon 03/26/2016   History of advanced adenoma. Surveillance colonoscopy in 2021 negative. Repeat next surveillance colonoscopy due in 5 years (2026).   Past Surgical History:  Procedure Laterality Date   ABDOMINAL HYSTERECTOMY     BLADDER SURGERY  2015   CHOLECYSTECTOMY     Social History:  reports that she has been smoking cigarettes. She has never used smokeless tobacco. She reports that she does not drink alcohol and does not use drugs.  Allergies  Allergen Reactions   Amoxicillin Nausea And Vomiting   Chantix [Varenicline] Other (See Comments)    Causes bad nightmares, hallucinations   Codeine Other (See Comments)    Shaking    Divalproex Sodium Other (See Comments)    sedation   Lithium Other (See Comments)    Drowsiness, tremors drowsiness   Oxycodone -Acetaminophen  Other (See Comments)   Propoxyphene Nausea And Vomiting   Other     Pt states "I cannot take steroids"   Penicillins    Augmentin [Amoxicillin-Pot Clavulanate] Diarrhea and Other (See Comments)   Prednisone      Moodiness   Zolpidem Other (See Comments)    Patient states it does not agree with her body.     Family History  Problem Relation Age of Onset   Renal Disease Mother    Heart attack Father  Thyroid  disease Neg Hx     Prior to Admission medications   Medication Sig Start Date End Date Taking? Authorizing Provider  ALPRAZolam  (XANAX ) 1 MG tablet Take 1 mg by mouth 4 (four) times daily as needed. 09/09/19  Yes [provider]  baclofen  (LIORESAL ) 10 MG tablet TK 1 T PO TID PRN 11/28/18  Yes [provider]  desoximetasone  (TOPICORT ) 0.25 % cream Apply 1 Application topically 2 (two) times daily. 06/12/23  Yes Martin, Mary-Margaret, FNP  fluPHENAZine (PROLIXIN) 2.5  MG tablet Take 2.5 mg by mouth at bedtime.   Yes [provider]  hydrocortisone  2.5 % ointment Apply topically 2 (two) times daily. Apply to corners of mouth twice daily for 2 weeks as needed. 07/26/23  Yes Dellar Fenton, DO  oxyCODONE -acetaminophen  (PERCOCET) 10-325 MG tablet Take 1 tablet by mouth 4 (four) times daily as needed. 12/06/21  Yes [provider]  QUEtiapine  (SEROQUEL ) 400 MG tablet Take 800 mg by mouth at bedtime. 12/25/19  Yes [provider]  Calcium Polycarbophil (FIBER-CAPS PO) Take 1 capsule by mouth daily.    [provider]  cetirizine  (ZYRTEC  ALLERGY) 10 MG tablet Take 1 tablet (10 mg total) by mouth daily. 12/27/19   Kehrli, Kelsey F, PA-C  ciclopirox  (PENLAC ) 8 % solution Apply topically at bedtime. Apply over nail and surrounding skin. Apply daily over previous coat. After seven (7) days, may remove with alcohol and continue cycle. 12/07/21   Standiford, Karlene Overcast, DPM  fluticasone  (FLONASE ) 50 MCG/ACT nasal spray Place 2 sprays into both nostrils daily. 02/06/22   Gaylyn Keas, Mary-Margaret, FNP  Galcanezumab -gnlm (EMGALITY ) 120 MG/ML SOAJ Inject 120 mg into the skin every 28 (twenty-eight) days. 06/08/20   Merriam Abbey, DO  linaclotide  (LINZESS ) 145 MCG CAPS capsule Take 1 capsule (145 mcg total) by mouth daily before breakfast. 01/30/20   Gaylyn Keas, Mary-Margaret, FNP  naloxone  (NARCAN ) nasal spray 4 mg/0.1 mL     [provider]  omeprazole  (PRILOSEC) 40 MG capsule Take by mouth. 12/13/20   [provider]  promethazine  (PHENERGAN ) 25 MG tablet Take 1 tablet (25 mg total) by mouth every 6 (six) hours as needed for nausea or vomiting. 06/17/19   Vicky Grange M, DO  rizatriptan  (MAXALT ) 10 MG tablet TAKE 1 TABLET BY MOUTH AS NEEDED FOR MIGRAINE-MAY REPEAT IN 2 HRS IF NEEDED-MAX 2/24HRS 08/04/21   Gaylyn Keas, Mary-Margaret, FNP  sodium chloride  (OCEAN) 0.65 % SOLN nasal spray Place 1 spray into both nostrils as needed for congestion.  07/13/20   Ijaola, Onyeje M, NP  vortioxetine  HBr (TRINTELLIX ) 20 MG TABS tablet Take 1 tablet (20 mg total) by mouth daily. 11/16/18   Suzi Essex, MD    Physical Exam: Vitals:   08/18/23 1801 08/18/23 1830 08/18/23 2000 08/18/23 2020  BP:  109/78 114/73   Pulse: 92 95 99   Resp: 19 15 18    Temp:      SpO2: 91% 91% 95%   Weight:    61.4 kg  Height:    5' (1.524 m)   Constitutional: Acutely ill looking, anxious NAD, calm, comfortable Eyes: PERRL, lids and conjunctivae normal ENMT: Mucous membranes are moist. Posterior pharynx clear of any exudate or lesions.Normal dentition.  Neck: normal, supple, no masses, no thyromegaly Respiratory: Decreased air entry bilaterally, mild expiratory wheezes no crackles. Normal respiratory effort. No accessory muscle use.  Cardiovascular: Regular rate and rhythm, no murmurs / rubs / gallops. No extremity edema. 2+ pedal pulses. No carotid bruits.  Abdomen:  no tenderness, no masses palpated. No hepatosplenomegaly. Bowel sounds positive.  Musculoskeletal: Good range of motion, no joint swelling or tenderness, Skin: no rashes, lesions, ulcers. No induration Neurologic: CN 2-12 grossly intact. Sensation intact, DTR normal. Strength 5/5 in all 4.  Psychiatric: Normal judgment and insight. Alert and oriented x 3. Normal mood  Data Reviewed:  Vitals appear stable.  Patient currently on 3 L of oxygen  per minute.  She has oxygen  sats of 84% on room air and blood pressure 92/55.  Alk phos 108 creatinine 1.93 chloride 96 calcium 11.1 glucose 136, chest x-ray showed no acute findings.  Acute viral screen is negative for flu COVID and RSV   Assessment and Plan:  #1 acute on chronic respiratory failure with hypoxia: Suspected COPD exacerbation.  Will initiate IV steroid nebulizer treatments and antibiotics.  Will get echocardiogram to evaluate for possible CHF  #2 COPD with acute exacerbation, continue treatment as above  #3 tobacco abuse: Counseling provided.   Nicotine  patch started  #4 essential hypertension: Continue home medications  #5 chronic pain syndrome: Patient will be maintained on home oxycodone  acetaminophen  dose.  Continue to reassess  #6 major depressive disorder: Stable.  Continue home regimen  #7 generalized anxiety disorder, patient is on Ativan .  Will continue    Advance Care Planning:   Code Status: Full Code   Consults: None  Family Communication: Husband over the phone  Severity of Illness: The appropriate patient status for this patient is INPATIENT. Inpatient status is judged to be reasonable and necessary in order to provide the required intensity of service to ensure the patient's safety. The patient's presenting symptoms, physical exam findings, and initial radiographic and laboratory data in the context of their chronic comorbidities is felt to place them at high risk for further clinical deterioration. Furthermore, it is not anticipated that the patient will be medically stable for discharge from the hospital within 2 midnights of admission.   * I certify that at the point of admission it is my clinical judgment that the patient will require inpatient hospital care spanning beyond 2 midnights from the point of admission due to high intensity of service, high risk for further deterioration and high frequency of surveillance required.*  AuthorCarolin Chyle, MD 08/18/2023 10:46 PM  For on call review [email or web address].

## 2023-08-19 ENCOUNTER — Inpatient Hospital Stay (HOSPITAL_COMMUNITY)

## 2023-08-19 ENCOUNTER — Other Ambulatory Visit (HOSPITAL_COMMUNITY)

## 2023-08-19 DIAGNOSIS — J9621 Acute and chronic respiratory failure with hypoxia: Secondary | ICD-10-CM | POA: Diagnosis not present

## 2023-08-19 DIAGNOSIS — J9601 Acute respiratory failure with hypoxia: Secondary | ICD-10-CM | POA: Diagnosis not present

## 2023-08-19 LAB — COMPREHENSIVE METABOLIC PANEL WITH GFR
ALT: 9 U/L (ref 0–44)
AST: 25 U/L (ref 15–41)
Albumin: 3.3 g/dL — ABNORMAL LOW (ref 3.5–5.0)
Alkaline Phosphatase: 91 U/L (ref 38–126)
Anion gap: 13 (ref 5–15)
BUN: 16 mg/dL (ref 6–20)
CO2: 29 mmol/L (ref 22–32)
Calcium: 9.9 mg/dL (ref 8.9–10.3)
Chloride: 94 mmol/L — ABNORMAL LOW (ref 98–111)
Creatinine, Ser: 1.93 mg/dL — ABNORMAL HIGH (ref 0.44–1.00)
GFR, Estimated: 32 mL/min — ABNORMAL LOW (ref >60)
Glucose, Bld: 224 mg/dL — ABNORMAL HIGH (ref 70–99)
Potassium: 4.9 mmol/L (ref 3.5–5.1)
Sodium: 136 mmol/L (ref 135–145)
Total Bilirubin: 1 mg/dL (ref 0.0–1.2)
Total Protein: 6.6 g/dL (ref 6.5–8.1)

## 2023-08-19 LAB — ECHOCARDIOGRAM COMPLETE
AR max vel: 2.41 cm2
AV Area VTI: 2.59 cm2
AV Area mean vel: 2.53 cm2
AV Mean grad: 6 mmHg
AV Peak grad: 10.6 mmHg
Ao pk vel: 1.63 m/s
Area-P 1/2: 5.02 cm2
Est EF: >75
Height: 60 in
S' Lateral: 2 cm
Weight: 2183.44 [oz_av]

## 2023-08-19 LAB — SODIUM, URINE, RANDOM: Sodium, Ur: 36 mmol/L

## 2023-08-19 LAB — CBC
HCT: 41.2 % (ref 36.0–46.0)
Hemoglobin: 13.6 g/dL (ref 12.0–15.0)
MCH: 32.5 pg (ref 26.0–34.0)
MCHC: 33 g/dL (ref 30.0–36.0)
MCV: 98.6 fL (ref 80.0–100.0)
Platelets: 201 10*3/uL (ref 150–400)
RBC: 4.18 MIL/uL (ref 3.87–5.11)
RDW: 13.4 % (ref 11.5–15.5)
WBC: 13.9 10*3/uL — ABNORMAL HIGH (ref 4.0–10.5)
nRBC: 0 % (ref 0.0–0.2)

## 2023-08-19 LAB — CREATININE, URINE, RANDOM: Creatinine, Urine: 169 mg/dL

## 2023-08-19 LAB — MAGNESIUM: Magnesium: 1.6 mg/dL — ABNORMAL LOW (ref 1.7–2.4)

## 2023-08-19 LAB — TSH: TSH: 1.04 u[IU]/mL (ref 0.350–4.500)

## 2023-08-19 MED ORDER — IPRATROPIUM-ALBUTEROL 0.5-2.5 (3) MG/3ML IN SOLN
3.0000 mL | Freq: Four times a day (QID) | RESPIRATORY_TRACT | Status: DC
  Administered 2023-08-20: 3 mL via RESPIRATORY_TRACT
  Filled 2023-08-19: qty 3

## 2023-08-19 NOTE — Plan of Care (Signed)

## 2023-08-19 NOTE — Hospital Course (Addendum)
 [name] is a 48 y.o. female with past medical history significant of COPD, essential hypertension, asthma, bipolar 1 disorder, generalized anxiety disorder, irritable bowel syndrome, continued tobacco abuse currently on home O2 at 2 L/min presented to [facility] with complaint of fatigue and shortness of breath for 2 days preceding with fever, cough and rhinorrhea.  Patient denied any dizziness but had heaviness in her head.  In the ED patient was noted to have acute on chronic hypoxic respiratory failure and was admitted hospital for COPD/CHF exacerbation.    Acute on chronic respiratory failure with hypoxia: Secondary to COPD exacerbation.  Continue with IV steroids, nebulizers, antibiotics.  Check 2D echocardiogram.    COPD with acute exacerbation, continue with oxygen  nebulizers and steroids.   tobacco abuse: Nicotine  patch.   essential hypertension: Not on any antihypertensives at home.  Will continue to monitor.   chronic pain syndrome: Continue Percocet from home.   major depressive disorder: Continue mirtazapine , Seroquel  Prozac and Pristiq.   generalized anxiety disorder, on Xanax  at home.

## 2023-08-19 NOTE — Progress Notes (Signed)
 PROGRESS NOTE  [name] WUJ:[id number] DOB: [birth year] DOA: 08/18/2023 PCP: Delfina Feller, FNP   LOS: 1 day   Brief narrative:   [name] is a 48 y.o. female with past medical history significant of COPD, essential hypertension, asthma, bipolar 1 disorder, generalized anxiety disorder, irritable bowel syndrome, continued tobacco abuse, chronic respiratory failure  on home O2 at 2 L/min presented to [facility] with complaint of fatigue and shortness of breath for 2 days preceding with fever, cough and rhinorrhea.  Patient denied any dizziness but had heaviness in her head.  In the ED patient was noted to have acute on chronic hypoxic respiratory failure and was admitted hospital for COPD/CHF exacerbation.   Assessment/Plan: Principal Problem:   Acute respiratory failure with hypoxia (HCC) Active Problems:   Chronic pain syndrome   Essential hypertension   Major depressive disorder   Chronic obstructive pulmonary disease   Generalized anxiety disorder   Tobacco dependence  Acute on chronic respiratory failure with hypoxia: Secondary to COPD exacerbation.  Continue with IV steroids, nebulizers, antibiotics.  Check 2D echocardiogram.  Add proBNP.  Has diffuse wheezing.  On 2 L of oxygen  at baseline at home.  Continue empiric doxycycline  for sputum production.  Chest x-ray without any infiltrate.   COPD with acute exacerbation, continue with oxygen  nebulizers and steroids.  Continues to smoke.  Counseled about it.  Continue nicotine  patch.   tobacco abuse: Nicotine  patch.   essential hypertension: Not on any antihypertensives at home.  Will continue to monitor.   chronic pain syndrome: Continue Percocet from home.   major depressive disorder: Continue mirtazapine , Seroquel  Prozac and Pristiq.  Elevated creatinine levels.  Creatinine was 0.7 last year but was 1.53 months back.  1.9 today.  Received IV Lasix.  Will hold off.  Check renal ultrasound.   Check BMP in AM.   generalized anxiety disorder, on Xanax  at home.   DVT prophylaxis: enoxaparin  (LOVENOX ) injection 30 mg Start: 08/18/23 2230   Disposition: Home likely in 1 to 2 days  Status is: Inpatient Remains inpatient appropriate because: Pending clinical improvement, IV steroids,    Code Status:     Code Status: Full Code  Family Communication: None at bedside  Consultants: None  Procedures: None  Anti-infectives:  Doxycycline   Anti-infectives (From admission, onward)    Start     Dose/Rate Route Frequency Ordered Stop   08/18/23 2230  doxycycline  (VIBRA -TABS) tablet 100 mg        100 mg Oral Every 12 hours 08/18/23 2136     08/18/23 1800  azithromycin  (ZITHROMAX ) 500 mg in sodium chloride  0.9 % 250 mL IVPB        500 mg 250 mL/hr over 60 Minutes Intravenous  Once 08/18/23 1755 08/18/23 1854        Subjective: Today, patient was seen and examined at bedside.  Complains of slight improvement in with breathing.  Has some sputum production which is yellowish.  Denies any chest pain.  No fever chills or rigor.  No nausea vomiting.  Inquiring about her kidney function.  Objective: Vitals:   08/19/23 0753 08/19/23 0909  BP: 105/64   Pulse: 93   Resp: 18   Temp: 98.2 F (36.8 C)   SpO2: (!) 87% 92%    Intake/Output Summary (Last 24 hours) at 08/19/2023 1119 Last data filed at 08/18/2023 1854 Gross per 24 hour  Intake 250 ml  Output --  Net 250 ml   [employer]  08/18/23 2020 08/19/23 0500  Weight: 61.4 kg 61.9 kg   Body mass index is 26.65 kg/m.   Physical Exam: GENERAL: Patient is alert awake and oriented. Not in obvious distress.  Flat affect. HENT: No scleral pallor or icterus. Pupils equally reactive to light. Oral mucosa is moist NECK: is supple, no gross swelling noted. CHEST: Bilateral coarse breath sounds, wheezing noted. CVS: S1 and S2 heard, no murmur. Regular rate and rhythm.  ABDOMEN: Soft, non-tender, bowel sounds are  present. EXTREMITIES: Trace edema. CNS: Cranial nerves are intact. No focal motor deficits. SKIN: warm and dry without rashes.  Data Review: I have personally reviewed the following laboratory data and studies,  CBC: Recent Labs  Lab 08/18/23 1700 08/18/23 2201 08/19/23 0839  WBC 11.1* 9.6 13.9*  NEUTROABS 7.1  --   --   HGB 13.2 13.9 13.6  HCT 39.5 41.5 41.2  MCV 98.8 99.3 98.6  PLT 202 193 201   Basic Metabolic Panel: Recent Labs  Lab 08/18/23 1700 08/18/23 1711 08/18/23 2201 08/19/23 0839  NA 138  --   --  136  K 4.1  --   --  4.9  CL 96*  --   --  94*  CO2 31  --   --  29  GLUCOSE 136*  --   --  224*  BUN 16  --   --  16  CREATININE 1.93*  --  2.05* 1.93*  CALCIUM 11.1*  --   --  9.9  MG  --  1.6*  --   --    Liver Function Tests: Recent Labs  Lab 08/19/23 0839  AST 25  ALT 9  ALKPHOS 91  BILITOT 1.0  PROT 6.6  ALBUMIN 3.3*   No results for input(s): "LIPASE", "AMYLASE" in the last 168 hours. No results for input(s): "AMMONIA" in the last 168 hours. Cardiac Enzymes: No results for input(s): "CKTOTAL", "CKMB", "CKMBINDEX", "TROPONINI" in the last 168 hours. BNP (last 3 results) No results for input(s): "BNP" in the last 8760 hours.  ProBNP (last 3 results) Recent Labs    08/18/23 1700  PROBNP 746.0*    CBG: No results for input(s): "GLUCAP" in the last 168 hours. Recent Results (from the past 240 hours)  Resp panel by RT-PCR (RSV, Flu A&B, Covid) Anterior Nasal Swab     Status: None   Collection Time: 08/18/23  5:15 PM   Specimen: Anterior Nasal Swab  Result Value Ref Range Status   SARS Coronavirus 2 by RT PCR NEGATIVE NEGATIVE Final    Comment: (NOTE) SARS-CoV-2 target nucleic acids are NOT DETECTED.  The SARS-CoV-2 RNA is generally detectable in upper respiratory specimens during the acute phase of infection. The lowest concentration of SARS-CoV-2 viral copies this assay can detect is 138 copies/mL. A negative result does not preclude  SARS-Cov-2 infection and should not be used as the sole basis for treatment or other patient management decisions. A negative result may occur with  improper specimen collection/handling, submission of specimen other than nasopharyngeal swab, presence of viral mutation(s) within the areas targeted by this assay, and inadequate number of viral copies(<138 copies/mL). A negative result must be combined with clinical observations, patient history, and epidemiological information. The expected result is Negative.  Fact Sheet for Patients:  BloggerCourse.com  Fact Sheet for Healthcare Providers:  SeriousBroker.it  This test is no t yet approved or cleared by the [place]  FDA and  has been authorized for detection and/or diagnosis of SARS-CoV-2 by FDA  under an Emergency Use Authorization (EUA). This EUA will remain  in effect (meaning this test can be used) for the duration of the COVID-19 declaration under Section 564(b)(1) of the Act, 21 U.S.C.section 360bbb-3(b)(1), unless the authorization is terminated  or revoked sooner.       Influenza A by PCR NEGATIVE NEGATIVE Final   Influenza B by PCR NEGATIVE NEGATIVE Final    Comment: (NOTE) The Xpert Xpress SARS-CoV-2/FLU/RSV plus assay is intended as an aid in the diagnosis of influenza from Nasopharyngeal swab specimens and should not be used as a sole basis for treatment. Nasal washings and aspirates are unacceptable for Xpert Xpress SARS-CoV-2/FLU/RSV testing.  Fact Sheet for Patients: BloggerCourse.com  Fact Sheet for Healthcare Providers: SeriousBroker.it  This test is not yet approved or cleared by the [place]  FDA and has been authorized for detection and/or diagnosis of SARS-CoV-2 by FDA under an Emergency Use Authorization (EUA). This EUA will remain in effect (meaning this test can be used) for the duration of  the COVID-19 declaration under Section 564(b)(1) of the Act, 21 U.S.C. section 360bbb-3(b)(1), unless the authorization is terminated or revoked.     Resp Syncytial Virus by PCR NEGATIVE NEGATIVE Final    Comment: (NOTE) Fact Sheet for Patients: BloggerCourse.com  Fact Sheet for Healthcare Providers: SeriousBroker.it  This test is not yet approved or cleared by the [place]  FDA and has been authorized for detection and/or diagnosis of SARS-CoV-2 by FDA under an Emergency Use Authorization (EUA). This EUA will remain in effect (meaning this test can be used) for the duration of the COVID-19 declaration under Section 564(b)(1) of the Act, 21 U.S.C. section 360bbb-3(b)(1), unless the authorization is terminated or revoked.  Performed at [facility], [address] [place], [place] [address]      Studies: DG Chest 2 View Result Date: 08/18/2023 CLINICAL DATA:  Cough, generalized fatigue, low O2 sats. Shortness of breath. EXAM: CHEST - 2 VIEW COMPARISON:  11/09/2019 FINDINGS: The heart size and mediastinal contours are within normal limits. Both lungs are clear. The visualized skeletal structures are unremarkable. IMPRESSION: No active cardiopulmonary disease. Electronically Signed   By: Janeece Mechanic M.D.   On: 08/18/2023 17:29      Rosena Conradi, MD  [facility] 08/19/2023  If 7PM-7AM, please contact night-coverage

## 2023-08-20 ENCOUNTER — Other Ambulatory Visit (HOSPITAL_COMMUNITY): Payer: Self-pay

## 2023-08-20 DIAGNOSIS — J9601 Acute respiratory failure with hypoxia: Secondary | ICD-10-CM | POA: Diagnosis not present

## 2023-08-20 LAB — BASIC METABOLIC PANEL WITH GFR
Anion gap: 12 (ref 5–15)
BUN: 27 mg/dL — ABNORMAL HIGH (ref 6–20)
CO2: 30 mmol/L (ref 22–32)
Calcium: 9.6 mg/dL (ref 8.9–10.3)
Chloride: 94 mmol/L — ABNORMAL LOW (ref 98–111)
Creatinine, Ser: 2.2 mg/dL — ABNORMAL HIGH (ref 0.44–1.00)
GFR, Estimated: 27 mL/min — ABNORMAL LOW (ref >60)
Glucose, Bld: 151 mg/dL — ABNORMAL HIGH (ref 70–99)
Potassium: 4.2 mmol/L (ref 3.5–5.1)
Sodium: 136 mmol/L (ref 135–145)

## 2023-08-20 LAB — CBC
HCT: 39.1 % (ref 36.0–46.0)
Hemoglobin: 13.1 g/dL (ref 12.0–15.0)
MCH: 33.1 pg (ref 26.0–34.0)
MCHC: 33.5 g/dL (ref 30.0–36.0)
MCV: 98.7 fL (ref 80.0–100.0)
Platelets: 216 10*3/uL (ref 150–400)
RBC: 3.96 MIL/uL (ref 3.87–5.11)
RDW: 13.6 % (ref 11.5–15.5)
WBC: 23.9 10*3/uL — ABNORMAL HIGH (ref 4.0–10.5)
nRBC: 0 % (ref 0.0–0.2)

## 2023-08-20 LAB — MAGNESIUM: Magnesium: 1.5 mg/dL — ABNORMAL LOW (ref 1.7–2.4)

## 2023-08-20 MED ORDER — NICOTINE 21 MG/24HR TD PT24
21.0000 mg | MEDICATED_PATCH | Freq: Every day | TRANSDERMAL | 0 refills | Status: DC
  Filled 2023-08-20 (×2): qty 28, 28d supply, fill #0

## 2023-08-20 MED ORDER — DOXYCYCLINE HYCLATE 100 MG PO TABS: 100.0000 mg | ORAL_TABLET | Freq: Two times a day (BID) | ORAL | 0 refills | Status: AC

## 2023-08-20 MED ORDER — GUAIFENESIN ER 600 MG PO TB12
600.0000 mg | ORAL_TABLET | Freq: Two times a day (BID) | ORAL | 0 refills | Status: DC
Start: 2023-08-20 — End: 2023-11-19

## 2023-08-20 MED ORDER — MAGNESIUM OXIDE -MG SUPPLEMENT 400 (240 MG) MG PO TABS
400.0000 mg | ORAL_TABLET | Freq: Two times a day (BID) | ORAL | Status: DC
  Administered 2023-08-20: 400 mg via ORAL
  Filled 2023-08-20: qty 1

## 2023-08-20 MED ORDER — ALBUTEROL SULFATE HFA 108 (90 BASE) MCG/ACT IN AERS: 2.0000 | INHALATION_SPRAY | Freq: Four times a day (QID) | RESPIRATORY_TRACT | 2 refills | Status: AC | PRN

## 2023-08-20 MED ORDER — NICOTINE 21 MG/24HR TD PT24: 21.0000 mg | MEDICATED_PATCH | Freq: Every day | TRANSDERMAL | 0 refills | Status: DC

## 2023-08-20 NOTE — Plan of Care (Signed)

## 2023-08-20 NOTE — Discharge Summary (Addendum)
 Physician Discharge Summary  [name] QMV:[id number] DOB: Feb 02, 1976 DOA: 08/18/2023  PCP: Delfina Feller, FNP  Admit date: 08/18/2023 Discharge date: 08/20/2023  Admitted From: Home  Discharge disposition: Home   Recommendations for Outpatient Follow-Up:   Follow up with your primary care provider in one week.  Check CBC, BMP, magnesium  in the next visit Please consider nephrology and cardiology evaluation as outpatient.  Patient did have mild to moderate pericardial effusion and has elevated creatinine. Patient should be encouraged to quit smoking.  Nicotine  patch has been prescribed.   Discharge Diagnosis:   Principal Problem:   Acute respiratory failure with hypoxia (HCC) Active Problems:   Chronic pain syndrome   Essential hypertension   Major depressive disorder   Chronic obstructive pulmonary disease   Generalized anxiety disorder   Tobacco dependence    Discharge Condition: Improved.  Diet recommendation: Low sodium, heart healthy.    Wound care: None.  Code status: Full.   History of Present Illness:   [name] is a 48 y.o. female with past medical history significant of COPD, essential hypertension, asthma, bipolar 1 disorder, generalized anxiety disorder, irritable bowel syndrome, continued tobacco abuse, chronic respiratory failure  on home O2 at 2 L/min presented to [facility] with complaint of fatigue and shortness of breath for 2 days preceding with fever, cough and rhinorrhea.  Patient denied any dizziness but had heaviness in her head.  In the ED patient was noted to have acute on chronic hypoxic respiratory failure and was admitted hospital for COPD exacerbation.      Hospital Course:   Following conditions were addressed during hospitalization as listed below,  Acute on chronic respiratory failure with hypoxia: Secondary to COPD exacerbation.  Patient received IV steroids nebulizers antibiotics during  hospitalization.  2D echocardiogram showed more than 70% LV ejection fraction with concentric LVH and small to moderate pericardial effusion without any evidence of tamponade.  Received empiric with doxycycline  will be continued on discharge.  Chest x-ray without any infiltrate.  Currently patient is on 2 L of oxygen  by nasal cannula which is at her baseline.  Congestive heart failure ruled out.   COPD with acute exacerbation, during hospitalization patient received oxygen  nebulizers and steroids.  Continues to smoke.  Counseled about it.  Continue nicotine  patch.  Patient is unable to tolerate prednisone .  Does not have active wheezing.  Will continue with oxygen , albuterol  inhaler and doxycycline  on discharge.   tobacco abuse: Nicotine  patch.  Will be continued on discharge.  Counseling done.   essential hypertension: Not on any antihypertensives at home.  Will continue to monitor.   chronic pain syndrome: Continue Percocet from home.   major depressive disorder: Continue mirtazapine , Seroquel  Prozac and Pristiq.   Elevated creatinine levels.  Creatinine was 0.7 last year but was 1.5, 3 months back.  Creatinine of 2.2 today.  Received IV Lasix  x 1.  Advised to follow-up with PCP as outpatient in consider nephrology follow-up.  Check BMP in the next visit.  generalized anxiety disorder, on Xanax  at home.   Disposition.  At this time, patient is stable for disposition home with outpatient PCP follow-up.  Medical Consultants:   None.  Procedures:    None Subjective:   Today, patient was seen and examined at bedside.  Feels better.  Wishes to go home.  Denies any wheezing cough chest pain nausea vomiting diarrhea or any other symptoms.  Feels at her baseline on 2 L of oxygen .  Discharge  Exam:   Vitals:   08/20/23 0726 08/20/23 0813  BP:  111/65  Pulse:  91  Resp:  17  Temp:  98.4 F (36.9 C)  SpO2: 93% 91%   Vitals:   08/20/23 0009 08/20/23 0531 08/20/23 0726 08/20/23 0813   BP: (!) 102/59 103/67  111/65  Pulse: (!) 103 90  91  Resp: 16 16  17   Temp: 98.5 F (36.9 C) 98.6 F (37 C)  98.4 F (36.9 C)  TempSrc: Oral Oral  Oral  SpO2: 93% 91% 93% 91%  Weight:      Height:        General: Alert awake, not in obvious distress, on nasal cannula oxygen  at 2 L/min. HENT: pupils equally reacting to light,  No scleral pallor or icterus noted. Oral mucosa is moist.  Chest:  Diminished breath sounds bilaterally.  No wheezes noted. CVS: S1 &S2 heard. No murmur.  Regular rate and rhythm. Abdomen: Soft, nontender, nondistended.  Bowel sounds are heard.   Extremities: No cyanosis, clubbing or edema.  Peripheral pulses are palpable. Psych: Alert, awake and oriented, normal mood CNS:  No cranial nerve deficits.  Power equal in all extremities.   Skin: Warm and dry.  No rashes noted.  The results of significant diagnostics from this hospitalization (including imaging, microbiology, ancillary and laboratory) are listed below for reference.     Diagnostic Studies:   ECHOCARDIOGRAM COMPLETE Result Date: 08/19/2023    ECHOCARDIOGRAM REPORT   Patient Name:   [name] Date of Exam: 08/19/2023 Medical Rec #:  [id number]         Height:       60.0 in Accession #:    [id number]        Weight:       136.5 lb Date of Birth:  [birth year]          BSA:          1.587 m Patient Age:    48 years          BP:           106/76 mmHg Patient Gender: F                 HR:           108 bpm. Exam Location:  Inpatient Procedure: 2D Echo, Cardiac Doppler and Color Doppler (Both Spectral and Color            Flow Doppler were utilized during procedure). Indications:    CHF  History:        Patient has no prior history of Echocardiogram examinations.                 Risk Factors:Hypertension.  Sonographer:    Janette Medley Referring Phys: [place].Coulter MOHAMMAD L GARBA IMPRESSIONS  1. Left ventricular ejection fraction, by estimation, is >75%. The left ventricle has hyperdynamic function. The left  ventricle has no regional wall motion abnormalities. There is mild concentric left ventricular hypertrophy. Left ventricular diastolic parameters were normal.  2. Right ventricular systolic function is normal. The right ventricular size is normal.  3. A small to moderate pericardial effusion is present. The pericardial effusion is circumferential. There is no evidence of cardiac tamponade.  4. The mitral valve is normal in structure. No evidence of mitral valve regurgitation. No evidence of mitral stenosis.  5. The aortic valve is normal in structure. Aortic valve regurgitation is not visualized. No aortic stenosis is present.  6. The  inferior vena cava is normal in size with <50% respiratory variability, suggesting right atrial pressure of 8 mmHg. FINDINGS  Left Ventricle: Left ventricular ejection fraction, by estimation, is >75%. The left ventricle has hyperdynamic function. The left ventricle has no regional wall motion abnormalities. The left ventricular internal cavity size was normal in size. There is mild concentric left ventricular hypertrophy. Left ventricular diastolic parameters were normal. Right Ventricle: The right ventricular size is normal. No increase in right ventricular wall thickness. Right ventricular systolic function is normal. Left Atrium: Left atrial size was normal in size. Right Atrium: Right atrial size was normal in size. Pericardium: A small pericardial effusion is present. The pericardial effusion is circumferential. There is no evidence of cardiac tamponade. Mitral Valve: The mitral valve is normal in structure. No evidence of mitral valve regurgitation. No evidence of mitral valve stenosis. Tricuspid Valve: The tricuspid valve is normal in structure. Tricuspid valve regurgitation is trivial. No evidence of tricuspid stenosis. Aortic Valve: The aortic valve is normal in structure. Aortic valve regurgitation is not visualized. No aortic stenosis is present. Aortic valve mean gradient  measures 6.0 mmHg. Aortic valve peak gradient measures 10.6 mmHg. Aortic valve area, by VTI measures 2.59 cm. Pulmonic Valve: The pulmonic valve was normal in structure. Pulmonic valve regurgitation is not visualized. No evidence of pulmonic stenosis. Aorta: The aortic root is normal in size and structure. Venous: The inferior vena cava is normal in size with less than 50% respiratory variability, suggesting right atrial pressure of 8 mmHg. IAS/Shunts: No atrial level shunt detected by color flow Doppler.  LEFT VENTRICLE PLAX 2D LVIDd:         3.60 cm   Diastology LVIDs:         2.00 cm   LV e' medial:    8.27 cm/s LV PW:         1.00 cm   LV E/e' medial:  11.1 LV IVS:        1.00 cm   LV e' lateral:   10.90 cm/s LVOT diam:     2.00 cm   LV E/e' lateral: 8.5 LV SV:         69 LV SV Index:   43 LVOT Area:     3.14 cm  RIGHT VENTRICLE             IVC RV S prime:     17.00 cm/s  IVC diam: 1.80 cm TAPSE (M-mode): 2.0 cm LEFT ATRIUM           Index        RIGHT ATRIUM           Index LA diam:      2.90 cm 1.83 cm/m   RA Area:     12.20 cm LA Vol (A2C): 19.1 ml 12.04 ml/m  RA Volume:   28.50 ml  17.96 ml/m LA Vol (A4C): 34.4 ml 21.68 ml/m  AORTIC VALVE AV Area (Vmax):    2.41 cm AV Area (Vmean):   2.53 cm AV Area (VTI):     2.59 cm AV Vmax:           163.00 cm/s AV Vmean:          108.000 cm/s AV VTI:            0.266 m AV Peak Grad:      10.6 mmHg AV Mean Grad:      6.0 mmHg LVOT Vmax:  125.00 cm/s LVOT Vmean:        87.000 cm/s LVOT VTI:          0.219 m LVOT/AV VTI ratio: 0.82  AORTA Ao Root diam: 2.90 cm Ao Asc diam:  2.70 cm MITRAL VALVE MV Area (PHT): 5.02 cm    SHUNTS MV Decel Time: 151 msec    Systemic VTI:  0.22 m MV E velocity: 92.20 cm/s  Systemic Diam: 2.00 cm MV A velocity: 94.90 cm/s MV E/A ratio:  0.97 Jules Oar MD Electronically signed by Jules Oar MD Signature Date/Time: 08/19/2023/3:55:33 PM    Final    DG Chest 2 View Result Date: 08/18/2023 CLINICAL DATA:  Cough,  generalized fatigue, low O2 sats. Shortness of breath. EXAM: CHEST - 2 VIEW COMPARISON:  11/09/2019 FINDINGS: The heart size and mediastinal contours are within normal limits. Both lungs are clear. The visualized skeletal structures are unremarkable. IMPRESSION: No active cardiopulmonary disease. Electronically Signed   By: Janeece Mechanic M.D.   On: 08/18/2023 17:29     Labs:   Basic Metabolic Panel: Recent Labs  Lab 08/18/23 1700 08/18/23 1711 08/18/23 2201 08/19/23 0839 08/20/23 0425  NA 138  --   --  136 136  K 4.1  --   --  4.9 4.2  CL 96*  --   --  94* 94*  CO2 31  --   --  29 30  GLUCOSE 136*  --   --  224* 151*  BUN 16  --   --  16 27*  CREATININE 1.93*  --  2.05* 1.93* 2.20*  CALCIUM 11.1*  --   --  9.9 9.6  MG  --  1.6*  --   --  1.5*   GFR Estimated Creatinine Clearance: 25.7 mL/min (A) (by C-G formula based on SCr of 2.2 mg/dL (H)). Liver Function Tests: Recent Labs  Lab 08/19/23 0839  AST 25  ALT 9  ALKPHOS 91  BILITOT 1.0  PROT 6.6  ALBUMIN 3.3*   No results for input(s): "LIPASE", "AMYLASE" in the last 168 hours. No results for input(s): "AMMONIA" in the last 168 hours. Coagulation profile No results for input(s): "INR", "PROTIME" in the last 168 hours.  CBC: Recent Labs  Lab 08/18/23 1700 08/18/23 2201 08/19/23 0839 08/20/23 0425  WBC 11.1* 9.6 13.9* 23.9*  NEUTROABS 7.1  --   --   --   HGB 13.2 13.9 13.6 13.1  HCT 39.5 41.5 41.2 39.1  MCV 98.8 99.3 98.6 98.7  PLT 202 193 201 216   Cardiac Enzymes: No results for input(s): "CKTOTAL", "CKMB", "CKMBINDEX", "TROPONINI" in the last 168 hours. BNP: Invalid input(s): "POCBNP" CBG: No results for input(s): "GLUCAP" in the last 168 hours. D-Dimer No results for input(s): "DDIMER" in the last 72 hours. Hgb A1c No results for input(s): "HGBA1C" in the last 72 hours. Lipid Profile No results for input(s): "CHOL", "HDL", "LDLCALC", "TRIG", "CHOLHDL", "LDLDIRECT" in the last 72 hours. Thyroid   function studies Recent Labs    08/18/23 1711  TSH 1.040   Anemia work up No results for input(s): "VITAMINB12", "FOLATE", "FERRITIN", "TIBC", "IRON", "RETICCTPCT" in the last 72 hours. Microbiology Recent Results (from the past 240 hours)  Resp panel by RT-PCR (RSV, Flu A&B, Covid) Anterior Nasal Swab     Status: None   Collection Time: 08/18/23  5:15 PM   Specimen: Anterior Nasal Swab  Result Value Ref Range Status   SARS Coronavirus 2 by RT PCR NEGATIVE NEGATIVE Final  Comment: (NOTE) SARS-CoV-2 target nucleic acids are NOT DETECTED.  The SARS-CoV-2 RNA is generally detectable in upper respiratory specimens during the acute phase of infection. The lowest concentration of SARS-CoV-2 viral copies this assay can detect is 138 copies/mL. A negative result does not preclude SARS-Cov-2 infection and should not be used as the sole basis for treatment or other patient management decisions. A negative result may occur with  improper specimen collection/handling, submission of specimen other than nasopharyngeal swab, presence of viral mutation(s) within the areas targeted by this assay, and inadequate number of viral copies(<138 copies/mL). A negative result must be combined with clinical observations, patient history, and epidemiological information. The expected result is Negative.  Fact Sheet for Patients:  BloggerCourse.com  Fact Sheet for Healthcare Providers:  SeriousBroker.it  This test is no t yet approved or cleared by the [place]  FDA and  has been authorized for detection and/or diagnosis of SARS-CoV-2 by FDA under an Emergency Use Authorization (EUA). This EUA will remain  in effect (meaning this test can be used) for the duration of the COVID-19 declaration under Section 564(b)(1) of the Act, 21 U.S.C.section 360bbb-3(b)(1), unless the authorization is terminated  or revoked sooner.       Influenza A by PCR  NEGATIVE NEGATIVE Final   Influenza B by PCR NEGATIVE NEGATIVE Final    Comment: (NOTE) The Xpert Xpress SARS-CoV-2/FLU/RSV plus assay is intended as an aid in the diagnosis of influenza from Nasopharyngeal swab specimens and should not be used as a sole basis for treatment. Nasal washings and aspirates are unacceptable for Xpert Xpress SARS-CoV-2/FLU/RSV testing.  Fact Sheet for Patients: BloggerCourse.com  Fact Sheet for Healthcare Providers: SeriousBroker.it  This test is not yet approved or cleared by the [place]  FDA and has been authorized for detection and/or diagnosis of SARS-CoV-2 by FDA under an Emergency Use Authorization (EUA). This EUA will remain in effect (meaning this test can be used) for the duration of the COVID-19 declaration under Section 564(b)(1) of the Act, 21 U.S.C. section 360bbb-3(b)(1), unless the authorization is terminated or revoked.     Resp Syncytial Virus by PCR NEGATIVE NEGATIVE Final    Comment: (NOTE) Fact Sheet for Patients: BloggerCourse.com  Fact Sheet for Healthcare Providers: SeriousBroker.it  This test is not yet approved or cleared by the [place]  FDA and has been authorized for detection and/or diagnosis of SARS-CoV-2 by FDA under an Emergency Use Authorization (EUA). This EUA will remain in effect (meaning this test can be used) for the duration of the COVID-19 declaration under Section 564(b)(1) of the Act, 21 U.S.C. section 360bbb-3(b)(1), unless the authorization is terminated or revoked.  Performed at [facility], [address] [place], [place] [address]      Discharge Instructions:   Discharge Instructions     Diet - low sodium heart healthy   Complete by: As directed    Discharge instructions   Complete by: As directed    Follow-up with your primary care provider in 1 week.  Check  blood work within 1 week.  Discussed about getting nephrology referral.  Follow-up with your cardiologist as outpatient.  Please quit smoking and use nicotine  patch to assist quitting.  Seek medical attention for worsening symptoms.  Continue oxygen  at home.  Avoidance of exertion.   Increase activity slowly   Complete by: As directed       Allergies as of 08/20/2023       Reactions   Amoxicillin Nausea And Vomiting  Chantix [varenicline] Other (See Comments)   Causes bad nightmares, hallucinations   Codeine Other (See Comments)   Shaking    Divalproex Sodium Other (See Comments)   sedation   Lithium Other (See Comments)   Drowsiness, tremors drowsiness   Oxycodone -acetaminophen  Other (See Comments)   Shaking, jitters   Penicillins Other (See Comments)   Reaction type/severity unknown   Prednisone  Other (See Comments)   Shaking, jitters, altered mood   Propoxyphene Nausea And Vomiting   Augmentin [amoxicillin-pot Clavulanate] Diarrhea, Other (See Comments)   Zolpidem Other (See Comments)   Patient states it does not agree with her body.         Medication List     TAKE these medications    albuterol  108 (90 Base) MCG/ACT inhaler Commonly known as: VENTOLIN  HFA Inhale 2 puffs into the lungs every 6 (six) hours as needed for wheezing or shortness of breath.   ALPRAZolam  1 MG tablet Commonly known as: XANAX  Take 1 mg by mouth 4 (four) times daily as needed.   baclofen  10 MG tablet Commonly known as: LIORESAL  Take 10 mg by mouth 3 (three) times daily as needed for muscle spasms.   desoximetasone  0.25 % cream Commonly known as: Topicort  Apply 1 Application topically 2 (two) times daily.   desvenlafaxine 100 MG 24 hr tablet Commonly known as: PRISTIQ Take 100 mg by mouth every morning.   doxycycline  100 MG tablet Commonly known as: VIBRA -TABS Take 1 tablet (100 mg total) by mouth every 12 (twelve) hours for 3 days.   FIBER-CAPS PO Take 1 capsule by mouth  daily.   FLUoxetine 20 MG capsule Commonly known as: PROZAC Take 20 mg by mouth daily.   fluPHENAZine 2.5 MG tablet Commonly known as: PROLIXIN Take 2.5 mg by mouth at bedtime.   guaiFENesin  600 MG 12 hr tablet Commonly known as: MUCINEX  Take 1 tablet (600 mg total) by mouth 2 (two) times daily.   naloxone  4 MG/0.1ML Liqd nasal spray kit Commonly known as: NARCAN  Place 1 spray into the nose once. For opioid overdose   nicotine  21 mg/24hr patch Commonly known as: NICODERM CQ  - dosed in mg/24 hours Place 1 patch (21 mg total) onto the skin daily. Start taking on: Aug 21, 2023   OLANZapine 10 MG tablet Commonly known as: ZYPREXA Take 10 mg by mouth at bedtime.   omeprazole  20 MG capsule Commonly known as: PRILOSEC Take 20 mg by mouth daily.   oxyCODONE -acetaminophen  10-325 MG tablet Commonly known as: PERCOCET Take 1 tablet by mouth 4 (four) times daily as needed.   QUEtiapine  400 MG tablet Commonly known as: SEROQUEL  Take 800 mg by mouth at bedtime.        Follow-up Information     Gaylyn Keas, Mary-Margaret, FNP Follow up in 1 week(s).   Specialty: Family Medicine Contact information: [address] [place] [address] [phone]                  Time coordinating discharge: 39 minutes  Signed:  Aubrianne Molyneux  [facility] 08/20/2023, 9:46 AM

## 2023-08-21 ENCOUNTER — Ambulatory Visit (INDEPENDENT_AMBULATORY_CARE_PROVIDER_SITE_OTHER): Admitting: Dermatology

## 2023-08-21 ENCOUNTER — Ambulatory Visit: Payer: Self-pay

## 2023-08-21 ENCOUNTER — Telehealth: Payer: Self-pay | Admitting: *Deleted

## 2023-08-21 ENCOUNTER — Encounter: Payer: Self-pay | Admitting: Dermatology

## 2023-08-21 VITALS — BP 121/74 | HR 104 | Temp 97.6°F

## 2023-08-21 DIAGNOSIS — C4492 Squamous cell carcinoma of skin, unspecified: Secondary | ICD-10-CM

## 2023-08-21 DIAGNOSIS — D0439 Carcinoma in situ of skin of other parts of face: Secondary | ICD-10-CM | POA: Diagnosis not present

## 2023-08-21 DIAGNOSIS — L579 Skin changes due to chronic exposure to nonionizing radiation, unspecified: Secondary | ICD-10-CM | POA: Diagnosis not present

## 2023-08-21 DIAGNOSIS — D099 Carcinoma in situ, unspecified: Secondary | ICD-10-CM

## 2023-08-21 DIAGNOSIS — L814 Other melanin hyperpigmentation: Secondary | ICD-10-CM | POA: Diagnosis not present

## 2023-08-21 LAB — PTH, INTACT AND CALCIUM: Calcium, Total (PTH): 10.5 mg/dL — ABNORMAL HIGH (ref 8.7–10.2)

## 2023-08-21 MED ORDER — MUPIROCIN 2 % EX OINT
1.0000 | TOPICAL_OINTMENT | Freq: Two times a day (BID) | CUTANEOUS | 2 refills | Status: DC
Start: 2023-08-21 — End: 2023-11-19

## 2023-08-21 NOTE — Progress Notes (Signed)
 Follow-Up Visit   Subjective  [name] is a 48 y.o. female who presents for the following: Mohs of head-anterior face  The following portions of the chart were reviewed this encounter and updated as appropriate: medications, allergies, medical history  Review of Systems:  No other skin or systemic complaints except as noted in HPI or Assessment and Plan.  Objective  Well appearing patient in no apparent distress; mood and affect are within normal limits.  A focused examination was performed of the following areas: head -anterior face Relevant physical exam findings are noted in the Assessment and Plan.   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 No   Was the anticoagulation regimen changed prior to Mohs? No    Anesthesia: Anesthesia method: local infiltration Local anesthetic: lidocaine  1% WITH epi  Procedure Details: Timeout: pre-procedure verification complete Procedure Prep: patient was prepped and draped in usual sterile fashion Prep type: chlorhexidine  Biopsy accession number: [id number] Biopsy lab: [facility] Frozen section biopsy performed: No   Specimen debulked: No   Pre-Op diagnosis: squamous cell carcinoma SCC subtype: in situ MohsAIQ Surgical site (if tumor spans multiple areas, please select predominant area): nose Surgery side: right Surgical site (from skin exam): head-anterior face Pre-operative length (cm): 0.7 Pre-operative width (cm): 0.8 Indications for Mohs surgery: anatomic location where tissue conservation is critical Previously treated? No    Micrographic Surgery Details: Post-operative length (cm): 0.9 Post-operative width (cm): 1 Number of Mohs stages: 1 Is this a complex case (associate members  only): No    Stage 1    Tumor features identified on Mohs section: no tumor identified    Depth of defect after stage: subcutaneous fat    Perineural invasion: no perineural invasion  Patient tolerance of procedure: tolerated well, no immediate complications  Reconstruction: Was the defect reconstructed?: No    Opioids: Did the patient receive a prescription for opioid/narcotic related to Mohs surgery?: No    Antibiotics: Does patient meet AHA guidelines for endocarditis?: No   Does patient meet AHA guidelines for orthopedic prophylaxis?: No   Were antibiotics given on the day of surgery? Yes   When were antibiotics given? post-operative Did surgery breach mucosa, expose cartilage/bone, involve an area of lymphedema/inflamed/infected tissue? No   Indication for post-operative antibiotics: anatomic location SQUAMOUS CELL CARCINOMA IN SITU (SCCIS)    - mupirocin ointment (BACTROBAN) 2 %; Apply 1 Application topically 2 (two) times daily.  Dispense: 22 g; Refill: 2  Return in about 4 weeks (around 09/18/2023).  Tonnie Frederick, Surg Tech III, am acting as scribe for Deneise Finlay, MD.    08/21/2023  HISTORY OF PRESENT ILLNESS  [name] is seen in consultation at the request of Dr. 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 They note that the area has been present for about 6 months increasing in size with time.  There is no history of previous treatment.  Reports no other new or changing lesions and has no other complaints today.  Medications and allergies: see patient chart.  Review of systems: Reviewed 8 systems and notable for the above skin cancer.  All other systems reviewed are unremarkable/negative, unless noted in the HPI. Past medical history, surgical history, family history, social history were also reviewed and are noted  in the chart/questionnaire.    PHYSICAL EXAMINATION  General: Well-appearing, in no acute  distress, alert and oriented x 4. Vitals reviewed in chart (if available).   Skin: Exam reveals a 0.7 x 0.8 cm erythematous papule and biopsy scar on the right nasal sidewall. There are rhytids, telangiectasias, and lentigines, consistent with photodamage.  Biopsy report(s) reviewed, confirming the diagnosis.   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 Due to location, size, histology, or recurrence and the likelihood of subclinical extension as well as the need to conserve normal surrounding tissue, the patient was deemed acceptable for Mohs micrographic surgery (MMS).  The nature and purpose of the procedure, associated benefits and risks including recurrence and scarring, possible complications such as pain, infection, and bleeding, and alternative methods of treatment if appropriate were discussed with the patient during consent. The lesion location was verified by the patient, by reviewing previous notes, pathology reports, and by photographs as well as angulation measurements if available.  Informed consent was reviewed and signed by the patient, and timeout was performed at 8:45 AM. See op note below.  2. For the photodamage and solar lentigines, sun protection discussed/information given on OTC sunscreens, and we recommend continued regular follow-up with primary dermatologist every 6 months or sooner for any growing, bleeding, or changing lesions. 3. Prognosis and future surveillance discussed. 4. Letter with treatment outcome sent to referring provider. 5. Pain acetaminophen /ibuprofen    MOHS MICROGRAPHIC SURGERY AND RECONSTRUCTION  Initial size:   0.7 x 0.8 cm Surgical defect/wound size: 0.9 x 1.0 cm Anesthesia:    0.33% lidocaine  with 1:200,000 epinephrine  EBL:    <5 mL Complications:  None Repair type:   Secondary Intention   Stages:1   STAGE I: Anesthesia achieved with 0.5% lidocaine  with  1:200,000 epinephrine . ChloraPrep applied. 1 section(s) excised using Mohs technique (this includes total peripheral and deep tissue margin excision and evaluation with frozen sections, excised and interpreted by the same physician). The tumor was first debulked and then excised with an approx. 2mm margin.  Hemostasis was achieved with electrocautery as needed.  The specimen was then oriented, subdivided/relaxed, inked, and processed using Mohs technique.    Frozen section analysis revealed a clear deep and peripheral margin.  Reconstruction  Patient was notified of results and repair options were discussed, including second intention healing. After reviewing the advantages and disadvantages of each, we agreed on second intention healing as appropriate.   The surgical site was then lightly scrubbed with sterile, saline-soaked gauze.  The area was bandaged using Vaseline ointment, non-adherent gauze, gauze pads, and tape to provide an adequate pressure dressing.   The patient tolerated the procedure well, was given detailed written and verbal wound care instructions, and was discharged in good condition.  The patient will follow-up in 4 weeks and as scheduled with primary dermatologist.   Documentation: I have reviewed the above documentation for accuracy and completeness, and I agree with the above.  Deneise Finlay, MD

## 2023-08-21 NOTE — Patient Instructions (Signed)

## 2023-08-21 NOTE — Transitions of Care (Post Inpatient/ED Visit) (Signed)
 08/21/2023  Name: [name] MRN: [id number] DOB: Jun 15, 1975  Today's TOC FU Call Status: Today's TOC FU Call Status:: Successful TOC FU Call Completed TOC FU Call Complete Date: 08/21/23 Patient's Name and Date of Birth confirmed.  Transition Care Management Follow-up Telephone Call Date of Discharge: 08/20/23 Discharge Facility: [facility]) Type of Discharge: Inpatient Admission Primary Inpatient Discharge Diagnosis:: COPD/CHF exacerbation How have you been since you were released from the hospital?: Better Any questions or concerns?: No  Items Reviewed: Did you receive and understand the discharge instructions provided?: Yes Medications obtained,verified, and reconciled?: Yes (Medications Reviewed) Any new allergies since your discharge?: No Dietary orders reviewed?: Yes Type of Diet Ordered:: low sodium heart healthy Do you have support at home?: Yes People in Home [RPT]: alone Name of Support/Comfort Primary Source: [name]  Medications Reviewed Today: Medications Reviewed Today     Reviewed by Eilene Grater, RN (Case Manager) on 08/21/23 at 1456  Med List Status: <None>   Medication Order Taking? Sig Documenting Provider Last Dose Status Informant  albuterol  (VENTOLIN  HFA) 108 (90 Base) MCG/ACT inhaler [id number] Yes Inhale 2 puffs into the lungs every 6 (six) hours as needed for wheezing or shortness of breath. Pokhrel, Laxman, MD Taking Active   ALPRAZolam  (XANAX ) 1 MG tablet [id number] Yes Take 1 mg by mouth 4 (four) times daily as needed. [provider] Taking Active Self, Pharmacy Records  baclofen  (LIORESAL ) 10 MG tablet [id number] Yes Take 10 mg by mouth 3 (three) times daily as needed for muscle spasms. [provider] Taking Active Self, Pharmacy Records  Calcium Polycarbophil (FIBER-CAPS PO) [id number] Yes Take 1 capsule by mouth daily. [provider] Taking Active Self, Pharmacy Records  desoximetasone  (TOPICORT ) 0.25 %  cream [id number] Yes Apply 1 Application topically 2 (two) times daily. Delfina Feller, FNP Taking Active Self, Pharmacy Records  desvenlafaxine (PRISTIQ) 100 MG 24 hr tablet [id number] Yes Take 100 mg by mouth every morning. [provider] Taking Active Self, Pharmacy Records  doxycycline  (VIBRA -TABS) 100 MG tablet [id number] Yes Take 1 tablet (100 mg total) by mouth every 12 (twelve) hours for 3 days. Pokhrel, Laxman, MD Taking Active   FLUoxetine (PROZAC) 20 MG capsule [id number] Yes Take 20 mg by mouth daily. [provider] Taking Active Self, Pharmacy Records  fluPHENAZine (PROLIXIN) 2.5 MG tablet [id number] Yes Take 2.5 mg by mouth at bedtime. [provider] Taking Active Self, Pharmacy Records  guaiFENesin  (MUCINEX ) 600 MG 12 hr tablet [id number] Yes Take 1 tablet (600 mg total) by mouth 2 (two) times daily. Pokhrel, Laxman, MD Taking Active   mupirocin ointment (BACTROBAN) 2 % [id number] Yes Apply 1 Application topically 2 (two) times daily. Paci, Karina M, MD Taking Active   naloxone  (NARCAN ) nasal spray 4 mg/0.1 mL [id number] Yes Place 1 spray into the nose once. For opioid overdose [provider] Taking Active Self, Pharmacy Records           Med Note Cornelia Dieter, SEBASTIAN   Sun Aug 19, 2023  3:12 PM) PRN, no recent need/use  nicotine  (NICODERM CQ  - DOSED IN MG/24 HOURS) 21 mg/24hr patch [id number] Yes Place 1 patch (21 mg total) onto the skin daily. Pokhrel, Laxman, MD Taking Active   OLANZapine (ZYPREXA) 10 MG tablet [id number] Yes Take 10 mg by mouth at bedtime. [provider] Taking Active Self, Pharmacy Records           Med Note Cornelia Dieter, SEBASTIAN   Sun Aug 19, 2023  3:13 PM)  Patient reports she was told to stop this medication, [facility] has on autofill and continues to dispense.   omeprazole  (PRILOSEC) 20 MG capsule [id number] Yes Take 20 mg by mouth daily. [provider] Taking Active Self, Pharmacy Records  oxyCODONE -acetaminophen   (PERCOCET) 10-325 MG tablet [id number] Yes Take 1 tablet by mouth 4 (four) times daily as needed. [provider] Taking Active Self, Pharmacy Records  QUEtiapine  (SEROQUEL ) 400 MG tablet [id number] Yes Take 800 mg by mouth at bedtime. [provider] Taking Active Self, Pharmacy Records            Home Care and Equipment/Supplies: Were Home Health Services Ordered?: NA Any new equipment or medical supplies ordered?: NA  Functional Questionnaire: Do you need assistance with bathing/showering or dressing?: No Do you need assistance with meal preparation?: No Do you need assistance with eating?: No Do you have difficulty maintaining continence: No Do you need assistance with getting out of bed/getting out of a chair/moving?: No Do you have difficulty managing or taking your medications?: No  Follow up appointments reviewed: PCP Follow-up appointment confirmed?: Yes Date of PCP follow-up appointment?: 08/23/23 Follow-up Provider: [facility] NP Specialist Hospital Follow-up appointment confirmed?: No Reason Specialist Follow-Up Not Confirmed: Patient has Specialist Provider Number and will Call for Appointment (Patient is getting a referral from PCP for Nephrologist) Do you need transportation to your follow-up appointment?: No Do you understand care options if your condition(s) worsen?: Yes-patient verbalized understanding  SDOH Interventions Today    Flowsheet Row Most Recent Value  SDOH Interventions   Food Insecurity Interventions Intervention Not Indicated  Housing Interventions Intervention Not Indicated  Transportation Interventions Intervention Not Indicated, Patient Resources (Friends/Family)  Utilities Interventions Intervention Not Indicated       Goals      DIET - INCREASE WATER INTAKE     Try to drink 6-8 glasses of water daily     VBCI Transitions of Care (TOC) Care Plan     Problems:  Recent Hospitalization for treatment of CHF and COPD No  Hospital Follow Up Provider appointment [facility] made appointment for [id number] With NP  Hawks  Goal:  Over the next 30 days, the patient will not experience hospital readmission  Interventions:   Heart Failure Interventions: Provided education on low sodium diet Discussed the importance of keeping all appointments with provider  COPD Interventions: Assessed social determinant of health barriers Discussed the importance of adequate rest and management of fatigue with COPD Use of home oxygen   Patient Self Care Activities:  Attend all scheduled provider appointments Call pharmacy for medication refills 3-7 days in advance of running out of medications Call provider office for new concerns or questions  Participate in Transition of Care Program/Attend TOC scheduled calls Perform all self care activities independently  Perform IADL's (shopping, preparing meals, housekeeping, managing finances) independently Take medications as prescribed   use salt in moderation eliminate smoking in my home eliminate symptom triggers at home eat healthy/prescribed diet: low sodium heart healthy Wear oxygen  and monitor saturations  Plan:  An initial telephone outreach has been scheduled for: [phone] Next PCP appointment scheduled for: [phone] Telephone follow up appointment with care management team member scheduled for: [id number] Cecilie Coffee 1:15        Una Ganser BSN RN [facility] Health Care Management Coordinator [email or web address] Direct Dial: [phone]  Fax: [phone] Website: [email or web address]

## 2023-08-23 ENCOUNTER — Ambulatory Visit: Admitting: Family

## 2023-08-23 ENCOUNTER — Encounter: Payer: Self-pay | Admitting: Family

## 2023-08-23 VITALS — BP 90/63 | HR 93 | Temp 97.5°F | Ht 60.0 in | Wt 127.8 lb

## 2023-08-23 DIAGNOSIS — I509 Heart failure, unspecified: Secondary | ICD-10-CM

## 2023-08-23 DIAGNOSIS — N179 Acute kidney failure, unspecified: Secondary | ICD-10-CM

## 2023-08-23 DIAGNOSIS — B370 Candidal stomatitis: Secondary | ICD-10-CM | POA: Diagnosis not present

## 2023-08-23 DIAGNOSIS — J449 Chronic obstructive pulmonary disease, unspecified: Secondary | ICD-10-CM | POA: Diagnosis not present

## 2023-08-23 DIAGNOSIS — J9601 Acute respiratory failure with hypoxia: Secondary | ICD-10-CM | POA: Diagnosis not present

## 2023-08-23 MED ORDER — NYSTATIN 100000 UNIT/ML MT SUSP: 5.0000 mL | Freq: Four times a day (QID) | OROMUCOSAL | 0 refills | Status: DC

## 2023-08-23 MED ORDER — FLUCONAZOLE 150 MG PO TABS: 150.0000 mg | ORAL_TABLET | ORAL | 0 refills | Status: DC | PRN

## 2023-08-23 NOTE — Progress Notes (Signed)
 Subjective:    Patient ID: [name], female    DOB: 21-May-1975, 48 y.o.   MRN: [id number]  Chief Complaint  Patient presents with   Transitions Of Care   Today's visit was for Transitional Care Management.  The patient was discharged from [facility] on [facility]/20/23 with a primary diagnosis of COPD and CHF exacerbation.   Contact with the patient and/or caregiver, by a clinical staff member, was made on 08/21/23 and was documented as a telephone encounter within the EMR.  Through chart review and discussion with the patient I have determined that management of their condition is of moderate complexity.    Pt presented to the ED for SOB and O2 of 78%. Placed on O2, steroids, nebulizer, and antibiotics. She was discharged home on doxycyline. She will complete this today. She does have oxygen  at home, but not wearing it today. Reports she wanted to check her O2 without it.   Chest x-ray without any infiltrate.   Echocardiogram showed more than 70% LV ejection fraction.   She has COPD and has not smoked since her hospital admission. Wearing nicotine  patches.  Shortness of Breath This is a recurrent problem. The current episode started 1 to 4 weeks ago. The problem occurs intermittently.      Review of Systems  Respiratory:  Positive for shortness of breath.   All other systems reviewed and are negative.   Social History   Socioeconomic History   Marital status: Legally Separated    Spouse name: Not on file   Number of children: 3   Years of education: 11   Highest education level: 11th grade  Occupational History   Occupation: Disability  Tobacco Use   Smoking status: Every Day    Current packs/day: 1.00    Types: Cigarettes   Smokeless tobacco: Never  Vaping Use   Vaping status: Never Used  Substance and Sexual Activity   Alcohol use: No   Drug use: No   Sexual activity: Not Currently    Birth control/protection: Surgical  Other Topics Concern   Not on file   Social History Narrative   Right handed   Drinks caffeine    One story home   Social Drivers of Health   Financial Resource Strain: Low Risk  (09/14/2022)   Received from [facility]   Overall Financial Resource Strain (CARDIA)    Difficulty of Paying Living Expenses: Not hard at all  Food Insecurity: No Food Insecurity (08/21/2023)   Hunger Vital Sign    Worried About Running Out of Food in the Last Year: Never true    Ran Out of Food in the Last Year: Never true  Transportation Needs: No Transportation Needs (08/21/2023)   PRAPARE - Administrator, Civil Service (Medical): No    Lack of Transportation (Non-Medical): No  Physical Activity: Inactive (07/05/2022)   Exercise Vital Sign    Days of Exercise per Week: 0 days    Minutes of Exercise per Session: 0 min  Stress: No Stress Concern Present (07/05/2022)   [employer] of Occupational Health - Occupational Stress Questionnaire    Feeling of Stress : Only a little  Social Connections: Unknown (08/18/2023)   Social Connection and Isolation Panel [NHANES]    Frequency of Communication with Friends and Family: More than three times a week    Frequency of Social Gatherings with Friends and Family: Once a week    Attends Religious Services: Patient unable to answer  Active Member of Clubs or Organizations: No    Attends Banker Meetings: Never    Marital Status: Divorced   Family History  Problem Relation Age of Onset   Renal Disease Mother    Heart attack Father    Thyroid  disease Neg Hx         Objective:   Physical Exam Vitals reviewed.  Constitutional:      General: She is not in acute distress.    Appearance: She is well-developed.  Eyes:     Pupils: Pupils are equal, round, and reactive to light.  Neck:     Thyroid : No thyromegaly.  Cardiovascular:     Rate and Rhythm: Normal rate and regular rhythm.     Heart sounds: Normal heart sounds. No murmur heard. Pulmonary:     Effort:  Pulmonary effort is normal. No respiratory distress.     Breath sounds: Normal breath sounds. No wheezing.  Abdominal:     General: Bowel sounds are normal. There is no distension.     Palpations: Abdomen is soft.     Tenderness: There is no abdominal tenderness.  Musculoskeletal:        General: No tenderness. Normal range of motion.     Cervical back: Normal range of motion and neck supple.  Skin:    General: Skin is warm and dry.  Neurological:     Mental Status: She is alert and oriented to person, place, and time.     Cranial Nerves: No cranial nerve deficit.     Deep Tendon Reflexes: Reflexes are normal and symmetric.  Psychiatric:        Behavior: Behavior normal.        Thought Content: Thought content normal.        Judgment: Judgment normal.       BP 90/63   Pulse 93   Temp (!) 97.5 F (36.4 C) (Temporal)   Ht 5' (1.524 m)   Wt 127 lb 12.8 oz (58 kg)   SpO2 93%   BMI 24.96 kg/m      Assessment & Plan:  [name] comes in today with chief complaint of Transitions Of Care   Diagnosis and orders addressed:  1. Chronic obstructive pulmonary disease, unspecified COPD type (HCC) (Primary) Stable  - CMP14+EGFR - Magnesium  - CBC with Differential/Platelet  2. Congestive heart failure, unspecified HF chronicity, unspecified heart failure type (HCC) Stable Low salt diet  Weight daily - CMP14+EGFR - Brain natriuretic peptide - Magnesium  - CBC with Differential/Platelet - Ambulatory referral to Cardiology  3. Acute respiratory failure with hypoxia (HCC) - CMP14+EGFR - Magnesium  - CBC with Differential/Platelet  4. AKI (acute kidney injury) (HCC) If creatinine elevated, will place referral to nephrologists - CMP14+EGFR - Magnesium  - CBC with Differential/Platelet  5. Oral thrush - fluconazole  (DIFLUCAN ) 150 MG tablet; Take 1 tablet (150 mg total) by mouth every three (3) days as needed.  Dispense: 3 tablet; Refill: 0 - nystatin  (MYCOSTATIN )  100000 UNIT/ML suspension; Take 5 mLs (500,000 Units total) by mouth 4 (four) times daily.  Dispense: 473 mL; Refill: 0   Labs pending Continue current medications  Keep follow up with specialists  Health Maintenance reviewed Diet and exercise encouraged  No follow-ups on file.    Tommas Fragmin, FNP

## 2023-08-23 NOTE — Patient Instructions (Signed)
 Heart Failure: How to Prevent It Heart failure is a condition where the heart has trouble pumping blood. This may mean that the heart can't pump enough blood out to the body or that the heart doesn't fill up with enough blood. These problems can lead to symptoms like tiredness, trouble breathing, and swelling in the body. Heart failure is a common medical condition that affects the whole body, not just the heart. By making changes to your diet and lifestyle, you can help prevent heart failure and avoid serious health problems. How can this condition affect me? Heart failure can cause some serious problems that may get worse over time. These may include: Feeling tired during normal physical activities. Trouble breathing. Swelling in your legs, ankles, or feet. Weight gain. A cough. What can increase my risk? The risk of heart failure increases as a person gets older. The most common causes are high blood pressure and blocked blood vessels known as coronary artery disease.  Other things that may make you more likely to get heart failure include: Having any of these medical conditions: Heart valve problems. Diabetes. Thyroid  problems. Heart rhythm problems. Low blood counts. This is called anemia. Lung disease. Chronic kidney disease. Using tobacco or nicotine  products. Using alcohol or other substances. Taking medicines that can damage the heart, like chemotherapy drugs. Having a family history of heart failure. Being very overweight or obese. Infections caused by viruses. What actions can I take to prevent heart failure? Eating and drinking  If you're overweight or obese, talk with your health care provider about how many calories you should have each day to help you lose weight. Eat foods that are low in salt (sodium) and avoid adding extra salt to foods. Eat a balanced diet that includes: Fresh fruits and vegetables. Whole grains. Lean protein. Beans. Fat-free or low-fat dairy  products. Seafood 1-2 days a week. Avoid foods that have a lot of: Trans fats. Saturated fats. Sugar. Cholesterol. You may want to work with an expert in healthy eating called a dietitian. Alcohol Do not drink alcohol if: Your provider tells you not to drink. You're pregnant, may be pregnant, or plan to become pregnant. If you drink alcohol: Limit how much you have to: 0-1 drink a day if you're female. 0-2 drinks a day if you're female. Know how much alcohol is in your drink. In the [place] one drink is one 12 oz bottle of beer (355 mL), one 5 oz glass of wine (148 mL), or one 1 oz glass of hard liquor (44 mL). Lifestyle  Do not smoke, vape, or use nicotine  or tobacco. Exercise for at least 30 minutes, 5 days each week, or as much as told by your provider. Do moderate-intensity exercise, like brisk walking, biking, or water aerobics. Ask your provider which activities are safe for you. Try to get 7-9 hours of sleep each night. To help with sleep: Keep your bedroom cool and dark. Do not eat a heavy meal during the hour before you go to bed. Do not drink alcohol or drinks with caffeine  before bed. Avoid screentime before bedtime. This means avoiding TV, computers, tablets, and mobile phones. Find ways to relax and manage stress. These may include: Breathing exercises. Meditation. Yoga. Listening to music. General instructions See a provider regularly for screening and wellness checks. Work with your provider to manage your: Blood pressure. Cholesterol levels. Blood sugar, also called glucose levels. Weight. Where to find more information National Heart, Lung, and Blood Institute: BuffaloDryCleaner.gl National Institute  on Aging: BaseRingTones.pl American Heart Association: heart.org Contact a health care provider if: You're gaining weight quickly. You have increasing shortness of breath that's unusual for you. You tire easily or you're not able to do your usual activities. You cough  more than normal, especially when doing physical activity. You have swelling, or more swelling, in areas such as your hands, feet, or ankles. You have fast or irregular heartbeats (palpitations). Get help right away if: You have trouble breathing. You have pain or discomfort in your chest. You faint. These symptoms may be an emergency. Call 911 right away. Do not wait to see if the symptoms will go away. Do not drive yourself to the hospital. This information is not intended to replace advice given to you by your health care provider. Make sure you discuss any questions you have with your health care provider. Document Revised: 08/25/2022 Document Reviewed: 08/25/2022 Elsevier Patient Education  2024 [employer].

## 2023-08-24 ENCOUNTER — Ambulatory Visit: Payer: Self-pay | Admitting: Family

## 2023-08-24 LAB — CBC WITH DIFFERENTIAL/PLATELET
Basophils Absolute: 0 10*3/uL (ref 0.0–0.2)
Basos: 0 %
EOS (ABSOLUTE): 0.2 10*3/uL (ref 0.0–0.4)
Eos: 2 %
Hematocrit: 41 % (ref 34.0–46.6)
Hemoglobin: 13.7 g/dL (ref 11.1–15.9)
Immature Grans (Abs): 0.1 10*3/uL (ref 0.0–0.1)
Immature Granulocytes: 1 %
Lymphocytes Absolute: 3.5 10*3/uL — ABNORMAL HIGH (ref 0.7–3.1)
Lymphs: 29 %
MCH: 33.2 pg — ABNORMAL HIGH (ref 26.6–33.0)
MCHC: 33.4 g/dL (ref 31.5–35.7)
MCV: 99 fL — ABNORMAL HIGH (ref 79–97)
Monocytes Absolute: 0.8 10*3/uL (ref 0.1–0.9)
Monocytes: 7 %
Neutrophils Absolute: 7.3 10*3/uL — ABNORMAL HIGH (ref 1.4–7.0)
Neutrophils: 61 %
Platelets: 190 10*3/uL (ref 150–450)
RBC: 4.13 x10E6/uL (ref 3.77–5.28)
RDW: 13.5 % (ref 11.7–15.4)
WBC: 12 10*3/uL — ABNORMAL HIGH (ref 3.4–10.8)

## 2023-08-24 LAB — CMP14+EGFR
ALT: 42 IU/L — ABNORMAL HIGH (ref 0–32)
AST: 39 IU/L (ref 0–40)
Albumin: 3.9 g/dL (ref 3.9–4.9)
Alkaline Phosphatase: 126 IU/L — ABNORMAL HIGH (ref 44–121)
BUN/Creatinine Ratio: 17 (ref 9–23)
BUN: 25 mg/dL — ABNORMAL HIGH (ref 6–24)
Bilirubin Total: <0.2 mg/dL (ref 0.0–1.2)
CO2: 25 mmol/L (ref 20–29)
Calcium: 8.5 mg/dL — ABNORMAL LOW (ref 8.7–10.2)
Chloride: 102 mmol/L (ref 96–106)
Creatinine, Ser: 1.51 mg/dL — ABNORMAL HIGH (ref 0.57–1.00)
Globulin, Total: 2.4 g/dL (ref 1.5–4.5)
Glucose: 97 mg/dL (ref 70–99)
Potassium: 4.2 mmol/L (ref 3.5–5.2)
Sodium: 140 mmol/L (ref 134–144)
Total Protein: 6.3 g/dL (ref 6.0–8.5)
eGFR: 42 mL/min/{1.73_m2} — ABNORMAL LOW (ref >59)

## 2023-08-24 LAB — BRAIN NATRIURETIC PEPTIDE: BNP: 6.7 pg/mL (ref 0.0–100.0)

## 2023-08-24 LAB — MAGNESIUM: Magnesium: 1.7 mg/dL (ref 1.6–2.3)

## 2023-08-29 ENCOUNTER — Telehealth: Payer: Self-pay

## 2023-08-29 ENCOUNTER — Ambulatory Visit: Payer: Self-pay

## 2023-08-29 DIAGNOSIS — M25561 Pain in right knee: Secondary | ICD-10-CM | POA: Diagnosis not present

## 2023-08-29 DIAGNOSIS — M25562 Pain in left knee: Secondary | ICD-10-CM | POA: Diagnosis not present

## 2023-08-29 NOTE — Telephone Encounter (Signed)
 Copied from CRM [id number]. Topic: Clinical - Lab/Test Results >> Aug 29, 2023 10:42 AM Tiffany H wrote: Patient called to review lab results. Will call back to schedule follow ups. Patient is juggling appointment. Slightly overwhelmed by all the new information. Please reach out to patient to further explain.

## 2023-08-29 NOTE — Telephone Encounter (Signed)
 Returned call and patient states she has not further questions about lab results.

## 2023-08-29 NOTE — Telephone Encounter (Signed)
  Chief Complaint: cough  Symptoms: yellow and brown sputum, nasal congestion Frequency: intermittent with cough Pertinent Negatives: Patient denies difficulty breathing Disposition: [] ED /[] Urgent Care (no appt availability in office) / [x] Appointment(In office/virtual)/ []  [facility]/ [] Home Care/ [x] Refused Recommended Disposition /[]  Mobile Bus/ []  Follow-up with PCP Additional Notes:  Hx COPD. Last OV 08/23/23. Calling today because mucous has changed to brown and yellow. She feels she needs an expectorant.  Desats to 85% on room air, using supplemental o2 as needed. Currently  90% ra, she just returned from an appointment and will only wear o2 as she feels is needed.  Using inhaler with effect, not needing extra doses. She also has new nasal congestion. Low grade temperature. Acute evaluation advised, she declines and would like Carolann Chum to prescribe medication for cough. Please follow up and advise.    Copied from CRM [id number]. Topic: Clinical - Red Word Triage >> Aug 29, 2023  2:31 PM Tiffany H wrote: Red Word that prompted transfer to Nurse Triage: Patient called to advise she's coughing up brown phlegm. This is new. Please assist. Reason for Disposition  Change in color of sputum (e.g., from white to yellow-green sputum)  Protocols used: Cough - Chronic-A-AH

## 2023-08-30 ENCOUNTER — Other Ambulatory Visit: Payer: Self-pay | Admitting: *Deleted

## 2023-08-30 NOTE — Patient Outreach (Signed)
 Transition of Care week 2  Visit Note  08/30/2023  Name: [name] MRN: [id number]          DOB: 1976-03-16  Situation: Patient enrolled in [facility] 30-day program. Visit completed with patient by telephone.   Background:    Past Medical History:  Diagnosis Date   Anemia 08/19/2018   Asthma    Bipolar 1 disorder    Chronic low back pain 04/25/2017   Chronic neck pain 04/25/2017   Chronic obstructive pulmonary disease 11/28/2019   Chronic pain syndrome 03/05/2016   DDD (degenerative disc disease), cervical 05/27/2017   Dysuria 04/30/2020   Enlarged lymph node 05/30/2017   Seen on CTA- reactive vs sarcoidosis.   Essential hypertension 09/03/2015   Generalized anxiety disorder    H. 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 Surveillance colonoscopy in 2021 negative. Repeat next surveillance colonoscopy due in 5 years (2026).    Assessment: Patient Reported Symptoms: Cognitive        Neurological Neurological Review of Symptoms: No symptoms reported    HEENT HEENT Symptoms Reported: No symptoms reported      Cardiovascular Cardiovascular Symptoms Reported: No symptoms reported Does patient have uncontrolled Hypertension?: No Cardiovascular Conditions: Heart failure Cardiovascular Management Strategies: Routine screening, Adequate rest Cardiovascular  Self-Management Outcome: 3 (uncertain) Cardiovascular Comment: pt states she will purchase a scale and start weighing daily  Respiratory Respiratory Symptoms Reported:  (message sent to primary care provider reporting pt still has some phlegm, requests referral to pulmonary) Other Respiratory Symptoms: dyspnea with exertion,  pt states she stopped smoking on 08/18/23 Additional Respiratory Details: pt states she is still coughing up brownish colored phlegm (this has improved slightly) pt is no longer taking mucinex  but will start back, pt called nurse triage yesterday for primary care provider but has not heard back from anyone, denies fever Respiratory Conditions: COPD, Shortness of breath Respiratory Self-Management Outcome: 3 (uncertain) Respiratory Comment: oxygen  at 2 liters 24 /7,  02 saturation room air is around 87-89 %, with oxygen  levels are above 90  Endocrine Patient reports the following symptoms related to hypoglycemia or hyperglycemia : No symptoms reported Is patient diabetic?: No    Gastrointestinal Gastrointestinal Symptoms Reported: No symptoms reported   Nutrition Risk Screen (CP): No indicators present  Genitourinary Genitourinary Symptoms Reported: No symptoms reported Genitourinary Conditions: Chronic kidney disease Genitourinary Management Strategies: Adequate rest Genitourinary Self-Management Outcome: 3 (uncertain)  Integumentary Integumentary Symptoms Reported: No symptoms reported    Musculoskeletal Musculoskelatal Symptoms Reviewed: No symptoms reported        Psychosocial Psychosocial Symptoms Reported: No symptoms reported   Major Change/Loss/Stressor/Fears (CP): Denies Quality of Family Relationships: supportive Do you feel physically threatened by others?: No   There were no vitals filed for this visit.  Medications Reviewed Today     Reviewed by Daralyn Earl,  RN (Registered Nurse) on 08/30/23 at 1333  Med List Status: <None>   Medication Order  Taking? Sig Documenting Provider Last Dose Status Informant  albuterol  (VENTOLIN  HFA) 108 (90 Base) MCG/ACT inhaler [id number] No Inhale 2 puffs into the lungs every 6 (six) hours as needed for wheezing or shortness of breath. Pokhrel, Laxman, MD Taking Active   ALPRAZolam  (XANAX ) 1 MG tablet [id number] No Take 1 mg by mouth 4 (four) times daily as needed. [provider] Taking Active Self, Pharmacy Records  baclofen  (LIORESAL ) 10 MG tablet [id number] No Take 10 mg by mouth 3 (three) times daily as needed for muscle spasms. [provider] Taking Active Self, Pharmacy Records  Calcium Polycarbophil (FIBER-CAPS PO) [id number] No Take 1 capsule by mouth daily.  Patient not taking: Reported on 08/23/2023   [provider] Not Taking Active Self, Pharmacy Records  desoximetasone  (TOPICORT ) 0.25 % cream [id number] No Apply 1 Application topically 2 (two) times daily. Delfina Feller, FNP Taking Active Self, Pharmacy Records  desvenlafaxine (PRISTIQ) 100 MG 24 hr tablet [id number] No Take 100 mg by mouth every morning.  Patient not taking: Reported on 08/23/2023   [provider] Not Taking Active Self, Pharmacy Records  fluconazole  (DIFLUCAN ) 150 MG tablet [id number]  Take 1 tablet (150 mg total) by mouth every three (3) days as needed. Tommas Fragmin A, FNP  Active   FLUoxetine (PROZAC) 20 MG capsule [id number] No Take 20 mg by mouth daily.  Patient not taking: Reported on 08/23/2023   [provider] Not Taking Active Self, Pharmacy Records  fluPHENAZine (PROLIXIN) 2.5 MG tablet [id number] No Take 2.5 mg by mouth at bedtime. [provider] Taking Active Self, Pharmacy Records  guaiFENesin  (MUCINEX ) 600 MG 12 hr tablet [id number] No Take 1 tablet (600 mg total) by mouth 2 (two) times daily. Pokhrel, Laxman, MD Taking Active   mupirocin ointment (BACTROBAN) 2 % [id number] No Apply 1 Application topically 2 (two) times daily. Paci, Karina M, MD Taking Active    naloxone  (NARCAN ) nasal spray 4 mg/0.1 mL [id number] No Place 1 spray into the nose once. For opioid overdose  Patient not taking: Reported on 08/23/2023   [provider] Not Taking Active Self, Pharmacy Records           Med Note Cornelia Dieter, SEBASTIAN   Sun Aug 19, 2023  3:12 PM) PRN, no recent need/use  nicotine  (NICODERM CQ  - DOSED IN MG/24 HOURS) 21 mg/24hr patch [id number] No Place 1 patch (21 mg total) onto the skin daily. Pokhrel, Laxman, MD Taking Active   nystatin  (MYCOSTATIN ) 100000 UNIT/ML suspension [id number]  Take 5 mLs (500,000 Units total) by mouth 4 (four) times daily. Hawks, Christy A, FNP  Active   OLANZapine (ZYPREXA) 10 MG tablet [id number] No Take 10 mg by mouth at bedtime.  Patient not taking: Reported on 08/23/2023   [provider] Not Taking Active Self, Pharmacy Records           Med Note Cornelia Dieter, Arther Bill Aug 19, 2023  3:13 PM) Patient reports she was told to stop this medication, [facility] has on autof[facility] and continues to dispense.   omeprazole  (PRILOSEC) 20 MG capsule [id number] No Take 20 mg by mouth daily.  Patient not taking: Reported on 08/23/2023   [provider] Not Taking Active Self, Pharmacy Records  oxyCODONE -acetaminophen  (PERCOCET) 10-325 MG tablet [id number] No Take 1 tablet by mouth 4 (four) times daily as needed. [provider] Taking Active Self, Pharmacy Records  QUEtiapine  (  SEROQUEL ) 400 MG tablet [id number] No Take 800 mg by mouth at bedtime. [provider] Taking Active Self, Pharmacy Records            Goals Addressed             This Visit's Progress    VBCI Transitions of Care (TOC) Care Plan       Problems:  Recent Hospitalization for treatment of CHF and COPD No Hospital Follow Up Provider appointment Careguide made appointment for [id number] With NP  Hawks Patient saw primary care provider 5/22, pt states she called nurse triage on 5/28 but did not receive a response, states she is  coughing up brown, thick phlegm, slight improvement today, no fever, no other symptoms reported, pt stopped taking mucinex  but states she will start again, pt states she is not smoking, pt is wearing oxygen  at 2 liters 24/7.  Goal:  Over the next 30 days, the patient will not experience hospital readmission  Interventions:   Heart Failure Interventions: Provided education on low sodium diet Discussed the importance of keeping all appointments with provider Reviewed Heart Failure action plan Reviewed importance of daily weights and recording, ask pt to obtain scales  COPD Interventions: Assessed social determinant of health barriers Discussed the importance of adequate rest and management of fatigue with COPD Use of home oxygen  Reviewed oxygen  safety Reviewed signs /symptoms of infection Reviewed COPD action plan  In basket message sent to primary care provider reporting pt still has thick, brown phlegm (although not quite as much today), pt to start taking mucinex  again, ask if anything else she wants pt to do, ask for referral to pulmonary be completed if not already done (per pt request)  Patient Self Care Activities:  Attend all scheduled provider appointments Call pharmacy for medication refills 3-7 days in advance of running out of medications Call provider office for new concerns or questions  Participate in Transition of Care Program/Attend TOC scheduled calls Perform all self care activities independently  Perform IADL's (shopping, preparing meals, housekeeping, managing finances) independently Take medications as prescribed   use salt in moderation eliminate smoking in my home eliminate symptom triggers at home eat healthy/prescribed diet: low sodium heart healthy Wear oxygen  and monitor saturations Alternate activity with rest Call your doctor for any signs of infection, fever  Plan:  Telephone follow up appointment with care management team member scheduled for:   09/05/23 @ 215 pm                       Recommendation:   PCP Follow-up  Follow Up Plan:   Telephone follow-up 09/05/23 @ 215 pm  Cecilie Coffee [facility], BSN RN Care Manager/ Transition of Care [place]/ [facility] Population Health [phone]

## 2023-08-30 NOTE — Telephone Encounter (Signed)
 Lmtcb

## 2023-08-30 NOTE — Telephone Encounter (Signed)
 Pt needs to be seen! Go to Urgent care or ED.

## 2023-08-30 NOTE — Patient Instructions (Signed)
 Visit Information  Thank you for taking time to visit with me today. Please don't hesitate to contact me if I can be of assistance to you before our next scheduled telephone appointment.  Our next appointment is by telephone on 09/05/23 at 215 pm  Following is a copy of your care plan:   Goals Addressed             This Visit's Progress    VBCI Transitions of Care (TOC) Care Plan       Problems:  Recent Hospitalization for treatment of CHF and COPD No Hospital Follow Up Provider appointment Careguide made appointment for [id number] With NP  Hawks Patient saw primary care provider 5/22, pt states she called nurse triage on 5/28 but did not receive a response, states she is coughing up brown, thick phlegm, slight improvement today, no fever, no other symptoms reported, pt stopped taking mucinex  but states she will start again, pt states she is not smoking, pt is wearing oxygen  at 2 liters 24/7.  Goal:  Over the next 30 days, the patient will not experience hospital readmission  Interventions:   Heart Failure Interventions: Provided education on low sodium diet Discussed the importance of keeping all appointments with provider Reviewed Heart Failure action plan Reviewed importance of daily weights and recording, ask pt to obtain scales  COPD Interventions: Assessed social determinant of health barriers Discussed the importance of adequate rest and management of fatigue with COPD Use of home oxygen  Reviewed oxygen  safety Reviewed signs /symptoms of infection Reviewed COPD action plan  In basket message sent to primary care provider reporting pt still has thick, brown phlegm (although not quite as much today), pt to start taking mucinex  again, ask if anything else she wants pt to do, ask for referral to pulmonary be completed if not already done (per pt request)  Patient Self Care Activities:  Attend all scheduled provider appointments Call pharmacy for medication refills 3-7 days in  advance of running out of medications Call provider office for new concerns or questions  Participate in Transition of Care Program/Attend TOC scheduled calls Perform all self care activities independently  Perform IADL's (shopping, preparing meals, housekeeping, managing finances) independently Take medications as prescribed   use salt in moderation eliminate smoking in my home eliminate symptom triggers at home eat healthy/prescribed diet: low sodium heart healthy Wear oxygen  and monitor saturations Alternate activity with rest Call your doctor for any signs of infection, fever  Plan:  Telephone follow up appointment with care management team member scheduled for:  09/05/23 @ 215 pm                      The patient verbalized understanding of instructions, educational materials, and care plan provided today and DECLINED offer to receive copy of patient instructions, educational materials, and care plan.   Telephone follow up appointment with care management team member scheduled for:  Please call the care guide team at [phone] if you need to cancel or reschedule your appointment.   Please call the Suicide and Crisis Lifeline: 988 call the [place]  National Suicide Prevention Lifeline: 513-564-4042 or TTY: (718)351-4724 TTY (438)149-9232) to talk to a trained counselor call 1-800-273-TALK (toll free, 24 hour hotline) go to [facility] Urgent [facility] [address], [place] [phone]) call the [facility] Crisis Line: [phone] call 911 if you are experiencing a Mental Health or Behavioral Health Crisis or need someone to talk to.  Cecilie Coffee RNC, BSN  RN Care Manager/ Transition of Care [place]/ [facility] Population Health [phone]

## 2023-09-04 ENCOUNTER — Encounter: Payer: Self-pay | Admitting: Nurse Practitioner

## 2023-09-04 ENCOUNTER — Ambulatory Visit (INDEPENDENT_AMBULATORY_CARE_PROVIDER_SITE_OTHER): Admitting: Nurse Practitioner

## 2023-09-04 VITALS — BP 119/82 | HR 84 | Temp 98.3°F | Ht 60.0 in | Wt 131.0 lb

## 2023-09-04 DIAGNOSIS — D72829 Elevated white blood cell count, unspecified: Secondary | ICD-10-CM

## 2023-09-04 NOTE — Progress Notes (Signed)
 Subjective:    Patient ID: [name], female    DOB: 1975-05-30, 48 y.o.   MRN: [id number]   Chief Complaint: recheck labs  HPI  Patient is in today to follow up on elevated WBC. Last check it was 12.0. prior to that she was as high as 23.9. she was in the hospital 08/18/23 with COPD and WBC were normal. She has quit smoking since was in hospital.She denies any fever or congestion. She is feeling much better.  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   Abnormal finding of diagnostic imaging 12/22/2020   Angular cheilitis with candidiasis 08/24/2020   Thyroid  disorder 07/15/2020   Weight gain 07/15/2020   Subacute frontal sinusitis 07/13/2020   Generalized anxiety disorder    Asthma    Migraine headaches    Knee pain, bilateral 07/01/2020   Trochanteric bursitis of right hip 07/01/2020   Dysuria 04/30/2020   Presence of auditory hallucinations 12/14/2019   Chronic obstructive pulmonary disease 11/28/2019   Primary insomnia 11/28/2019   Major depressive disorder 11/13/2018   Anemia 08/19/2018   Hypokalemia 08/19/2018   Enlarged lymph node 05/30/2017   DDD (degenerative disc disease), cervical 05/27/2017   Long-term current use of opiate analgesic 04/25/2017   Chronic low back pain 04/25/2017   Chronic neck pain 04/25/2017   Nocturnal hypoxia 08/10/2016   Tubular adenoma of colon 03/26/2016   Chronic pain syndrome 03/05/2016   Essential hypertension 09/03/2015   Irritable bowel syndrome without diarrhea 10/15/2014   Tobacco dependence 01/27/2013   Hot flashes not due to menopause 10/14/2012   Pelvic and perineal pain 06/17/2012   Bipolar 1 disorder 05/15/2012   Rectal prolapse 05/15/2012       Review of Systems  Constitutional:  Positive for fatigue. Negative for diaphoresis.  Eyes:  Negative for pain.  Respiratory:  Negative for shortness  of breath.   Cardiovascular:  Negative for chest pain, palpitations and leg swelling.  Gastrointestinal:  Negative for abdominal pain.  Endocrine: Negative for polydipsia.  Skin:  Negative for rash.  Neurological:  Negative for dizziness, weakness and headaches.  Hematological:  Does not bruise/bleed easily.  All other systems reviewed and are negative.      Objective:   Physical Exam Vitals and nursing note reviewed.  Constitutional:      General: She is not in acute distress.    Appearance: Normal appearance. She is well-developed.  HENT:     Head: Normocephalic.  Neck:     Vascular: No carotid bruit or JVD.  Cardiovascular:     Rate and Rhythm: Normal rate and regular rhythm.     Heart sounds: Normal heart sounds.  Pulmonary:     Effort: Pulmonary effort is normal. No respiratory distress.     Breath sounds: Normal breath sounds. No wheezing or rales.  Chest:     Chest wall: No tenderness.  Abdominal:     General: Bowel sounds are normal. There is no distension or abdominal bruit.     Palpations: Abdomen is soft. There is no hepatomegaly, splenomegaly, mass or pulsatile mass.     Tenderness: There is no abdominal tenderness.  Musculoskeletal:        General: Normal range of motion.     Cervical back: Normal range of motion and neck supple.  Lymphadenopathy:     Cervical: No cervical adenopathy.  Skin:  General: Skin is warm and dry.  Neurological:     Mental Status: She is alert and oriented to person, place, and time.     Deep Tendon Reflexes: Reflexes are normal and symmetric.  Psychiatric:        Behavior: Behavior normal.        Thought Content: Thought content normal.        Judgment: Judgment normal.    BP 119/82   Pulse 84   Temp 98.3 F (36.8 C) (Temporal)   Ht 5' (1.524 m)   Wt 131 lb (59.4 kg)   SpO2 93%   BMI 25.58 kg/m         Assessment & Plan:   [name] in today with chief complaint of No chief complaint on file.   1.  Leukocytosis, unspecified type (Primary) Labs pending Continue to avoid smoking RTO prn - CBC with Differential/Platelet    The above assessment and management plan was discussed with the patient. The patient verbalized understanding of and has agreed to the management plan. Patient is aware to call the clinic if symptoms persist or worsen. Patient is aware when to return to the clinic for a follow-up visit. Patient educated on when it is appropriate to go to the emergency department.   Mary-Margaret Gaylyn Keas, FNP

## 2023-09-05 ENCOUNTER — Encounter: Payer: Self-pay | Admitting: Dermatology

## 2023-09-05 ENCOUNTER — Other Ambulatory Visit: Payer: Self-pay | Admitting: *Deleted

## 2023-09-05 DIAGNOSIS — Z720 Tobacco use: Secondary | ICD-10-CM | POA: Diagnosis not present

## 2023-09-05 DIAGNOSIS — I3139 Other pericardial effusion (noninflammatory): Secondary | ICD-10-CM | POA: Diagnosis not present

## 2023-09-05 DIAGNOSIS — I10 Essential (primary) hypertension: Secondary | ICD-10-CM | POA: Diagnosis not present

## 2023-09-05 LAB — CBC WITH DIFFERENTIAL/PLATELET
Basophils Absolute: 0.1 10*3/uL (ref 0.0–0.2)
Basos: 1 %
EOS (ABSOLUTE): 0.2 10*3/uL (ref 0.0–0.4)
Eos: 2 %
Hematocrit: 41.6 % (ref 34.0–46.6)
Hemoglobin: 13.7 g/dL (ref 11.1–15.9)
Immature Grans (Abs): 0.2 10*3/uL — ABNORMAL HIGH (ref 0.0–0.1)
Immature Granulocytes: 1 %
Lymphocytes Absolute: 3.6 10*3/uL — ABNORMAL HIGH (ref 0.7–3.1)
Lymphs: 28 %
MCH: 33.2 pg — ABNORMAL HIGH (ref 26.6–33.0)
MCHC: 32.9 g/dL (ref 31.5–35.7)
MCV: 101 fL — ABNORMAL HIGH (ref 79–97)
Monocytes Absolute: 1.2 10*3/uL — ABNORMAL HIGH (ref 0.1–0.9)
Monocytes: 9 %
Neutrophils Absolute: 7.8 10*3/uL — ABNORMAL HIGH (ref 1.4–7.0)
Neutrophils: 59 %
Platelets: 204 10*3/uL (ref 150–450)
RBC: 4.13 x10E6/uL (ref 3.77–5.28)
RDW: 13.6 % (ref 11.7–15.4)
WBC: 13 10*3/uL — ABNORMAL HIGH (ref 3.4–10.8)

## 2023-09-05 NOTE — Patient Outreach (Signed)
 Transition of Care week 3  Visit Note  09/05/2023  Name: [name] MRN: [id number]          DOB: 1975-06-12  Situation: Patient enrolled in [facility] 30-day program. Visit completed with patient by telephone.   Background:  Past Medical History:  Diagnosis Date   Anemia 08/19/2018   Asthma    Bipolar 1 disorder    Chronic low back pain 04/25/2017   Chronic neck pain 04/25/2017   Chronic obstructive pulmonary disease 11/28/2019   Chronic pain syndrome 03/05/2016   DDD (degenerative disc disease), cervical 05/27/2017   Dysuria 04/30/2020   Enlarged lymph node 05/30/2017   Seen on CTA- reactive vs sarcoidosis.   Essential hypertension 09/03/2015   Generalized anxiety disorder    H. 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 Surveillance colonoscopy in 2021 negative. Repeat next surveillance colonoscopy due in 5 years (2026).    Assessment: Patient Reported Symptoms: Cognitive Cognitive Status: Able to follow simple commands, Alert and oriented to person, place, and time, Normal speech and language skills      Neurological Neurological Review of Symptoms: No symptoms reported    HEENT HEENT Symptoms Reported: No symptoms reported      Cardiovascular Cardiovascular Symptoms Reported: No symptoms reported    Respiratory Respiratory Symptoms Reported: No  symptoms reported Other Respiratory Symptoms: pt states she is no longer coughing up phlegm, this has resolved, states " feel much better" Respiratory Conditions: COPD Respiratory Self-Management Outcome: 3 (uncertain) Respiratory Comment: oxygen  at  2 liters 24/7  Endocrine Patient reports the following symptoms related to hypoglycemia or hyperglycemia : No symptoms reported Is patient diabetic?: No    Gastrointestinal Gastrointestinal Symptoms Reported: No symptoms reported   Nutrition Risk Screen (CP): No indicators present  Genitourinary Genitourinary Symptoms Reported: No symptoms reported    Integumentary Integumentary Symptoms Reported: No symptoms reported    Musculoskeletal Musculoskelatal Symptoms Reviewed: No symptoms reported        Psychosocial Psychosocial Symptoms Reported: No symptoms reported         There were no vitals filed for this visit.  Medications Reviewed Today     Reviewed by Daralyn Earl, RN (Registered Nurse) on 09/05/23 at 1433  Med List Status: <None>   Medication Order Taking? Sig Documenting Provider Last Dose Status Informant  albuterol  (VENTOLIN  HFA) 108 (90 Base) MCG/ACT inhaler [id number] No Inhale 2 puffs into the lungs every 6 (six) hours as needed for wheezing or shortness of breath. Pokhrel, Laxman, MD Taking Active   ALPRAZolam  (XANAX ) 1 MG tablet [id number] No Take 1 mg by mouth 4 (four) times daily as needed. [provider] Taking Active Self, Pharmacy Records  baclofen  (LIORESAL ) 10 MG tablet [id number] No Take 10 mg by mouth 3 (three) times daily as needed for muscle spasms. [provider] Taking Active Self,  Pharmacy Records  Calcium Polycarbophil (FIBER-CAPS PO) [id number] No Take 1 capsule by mouth daily. [provider] Taking Active Self, Pharmacy Records  desoximetasone  (TOPICORT ) 0.25 % cream [id number] No Apply 1 Application topically 2 (two) times daily. Delfina Feller, FNP Taking Active Self,  Pharmacy Records  desvenlafaxine (PRISTIQ) 100 MG 24 hr tablet [id number] No Take 100 mg by mouth every morning. [provider] Taking Active Self, Pharmacy Records  fluconazole  (DIFLUCAN ) 150 MG tablet [id number] No Take 1 tablet (150 mg total) by mouth every three (3) days as needed. Yevette Hem, FNP Taking Active   FLUoxetine (PROZAC) 20 MG capsule [id number] No Take 20 mg by mouth daily. [provider] Taking Active Self, Pharmacy Records  fluPHENAZine (PROLIXIN) 2.5 MG tablet [id number] No Take 2.5 mg by mouth at bedtime. [provider] Taking Active Self, Pharmacy Records  guaiFENesin  (MUCINEX ) 600 MG 12 hr tablet [id number] No Take 1 tablet (600 mg total) by mouth 2 (two) times daily. Pokhrel, Laxman, MD Taking Active   mupirocin  ointment (BACTROBAN ) 2 % [id number] No Apply 1 Application topically 2 (two) times daily. Paci, Karina M, MD Taking Active   naloxone  (NARCAN ) nasal spray 4 mg/0.1 mL [id number] No Place 1 spray into the nose once. For opioid overdose [provider] Taking Active Self, Pharmacy Records           Med Note Cornelia Dieter, SEBASTIAN   Sun Aug 19, 2023  3:12 PM) PRN, no recent need/use  nicotine  (NICODERM CQ  - DOSED IN MG/24 HOURS) 21 mg/24hr patch [id number] No Place 1 patch (21 mg total) onto the skin daily. Pokhrel, Laxman, MD Taking Active   nystatin  (MYCOSTATIN ) 100000 UNIT/ML suspension [id number] No Take 5 mLs (500,000 Units total) by mouth 4 (four) times daily. Yevette Hem, FNP Taking Active   OLANZapine (ZYPREXA) 10 MG tablet [id number] No Take 10 mg by mouth at bedtime. [provider] Taking Active Self, Pharmacy Records           Med Note Cornelia Dieter, Arther Bill Aug 19, 2023  3:13 PM) Patient reports she was told to stop this medication, [facility] has on [facility]of[facility] and continues to dispense.   omeprazole  (PRILOSEC) 20 MG capsule [id number] No Take 20 mg by mouth daily. [provider] Taking Active Self, Pharmacy  Records  oxyCODONE -acetaminophen  (PERCOCET) 10-325 MG tablet [id number] No Take 1 tablet by mouth 4 (four) times daily as needed. [provider] Taking Active Self, Pharmacy Records  QUEtiapine  (SEROQUEL ) 400 MG tablet [id number] No Take 800 mg by mouth at bedtime. [provider] Taking Active Self, Pharmacy Records            Recommendation:   PCP Follow-up  Follow Up Plan:   Telephone follow-up 09/13/23 @ 115 pm  Cecilie Coffee [facility], BSN RN Care Manager/ Transition of Care [place]/ [facility] [phone]

## 2023-09-05 NOTE — Telephone Encounter (Signed)
 Patient never returned call and came in to see pcp 06/03 will close encounter

## 2023-09-05 NOTE — Patient Instructions (Signed)
 Visit Information  Thank you for taking time to visit with me today. Please don't hesitate to contact me if I can be of assistance to you before our next scheduled telephone appointment.  Our next appointment is by telephone on 09/13/23 @ 115 pm  Following is a copy of your care plan:   Goals Addressed             This Visit's Progress    VBCI Transitions of Care (TOC) Care Plan       Problems:  Recent Hospitalization for treatment of CHF and COPD No Hospital Follow Up Provider appointment Careguide made appointment for [id number] With NP  Hawks Patient saw primary care provider 5/22, pt states she called nurse triage on 5/28 but did not receive a response, states she is coughing up brown, thick phlegm, slight improvement today, no fever, no other symptoms reported, pt stopped taking mucinex  but states she will start again, pt states she is not smoking, pt is wearing oxygen  at 2 liters 24/7. 09/05/23- pt states she is no longer coughing up phlegm, pt is taking mucinex , continues on oxygen  at 2 liters 24/7, saw primary care provider 09/04/23, pt reports she saw cardiologist at [facility] today and still wants referral for pulmonary and states " they were waiting for me to go to cardiologist to see what they say"  Goal:  Over the next 30 days, the patient will not experience hospital readmission  Interventions:   Heart Failure Interventions: Provided education on low sodium diet Discussed the importance of keeping all appointments with provider Reinforced Heart Failure action plan Reinforced importance of daily weights and recording, ask pt to obtain scales  COPD Interventions: Assessed social determinant of health barriers Discussed the importance of adequate rest and management of fatigue with COPD Use of home oxygen  Reviewed oxygen  safety Reviewed signs /symptoms of infection Reviewed COPD action plan In basket message sent to primary care provider reporting pt saw cardiologist with  [facility] today and wants follow up on pulmonary referral  Patient Self Care Activities:  Attend all scheduled provider appointments Call pharmacy for medication refills 3-7 days in advance of running out of medications Call provider office for new concerns or questions  Participate in Transition of Care Program/Attend TOC scheduled calls Perform all self care activities independently  Perform IADL's (shopping, preparing meals, housekeeping, managing finances) independently Take medications as prescribed   use salt in moderation eliminate smoking in my home eliminate symptom triggers at home eat healthy/prescribed diet: low sodium heart healthy Wear oxygen  and monitor saturations Alternate activity with rest Call your doctor for any signs of infection, fever  Plan:  Telephone follow up appointment with care management team member scheduled for:  09/13/23 @ 115 pm                     The patient verbalized understanding of instructions, educational materials, and care plan provided today and DECLINED offer to receive copy of patient instructions, educational materials, and care plan.   Telephone follow up appointment with care management team member scheduled for:  09/13/23 @ 115 pm  Please call the care guide team at [phone] if you need to cancel or reschedule your appointment.   Please call the Suicide and Crisis Lifeline: 988 call the [place]  National Suicide Prevention Lifeline: 843-463-9258 or TTY: (614)426-9760 TTY (854) 538-3452) to talk to a trained counselor call 1-800-273-TALK (toll free, 24 hour hotline) go to [facility] [address], [place] (  [phone]) call the [facility]: [phone] call 911 if you are experiencing a Mental Health or Behavioral Health Crisis or need someone to talk to.  Cecilie Coffee [facility], BSN RN Care Manager/ Transition of Care [place]/ [facility] [phone]

## 2023-09-06 DIAGNOSIS — J449 Chronic obstructive pulmonary disease, unspecified: Secondary | ICD-10-CM | POA: Diagnosis not present

## 2023-09-07 ENCOUNTER — Ambulatory Visit: Payer: Self-pay | Admitting: Nurse Practitioner

## 2023-09-10 ENCOUNTER — Other Ambulatory Visit: Payer: Self-pay | Admitting: Nurse Practitioner

## 2023-09-10 ENCOUNTER — Telehealth: Payer: Self-pay | Admitting: Family Medicine

## 2023-09-10 DIAGNOSIS — J449 Chronic obstructive pulmonary disease, unspecified: Secondary | ICD-10-CM

## 2023-09-10 DIAGNOSIS — R7989 Other specified abnormal findings of blood chemistry: Secondary | ICD-10-CM

## 2023-09-10 NOTE — Addendum Note (Signed)
 Addended by: Cherylyn Cos on: 09/10/2023 05:13 PM   Modules accepted: Orders

## 2023-09-10 NOTE — Telephone Encounter (Signed)
 I told patient at appointment that she needs to see cardiology first then will do other referrals.

## 2023-09-10 NOTE — Telephone Encounter (Signed)
 Called and spoke with patient. She saw cardiology on 6/4. Note in chart. Please advise on other referrals

## 2023-09-10 NOTE — Telephone Encounter (Signed)
Nephrology referral done

## 2023-09-10 NOTE — Telephone Encounter (Signed)
 Do you want her to see a kidney dr as well?

## 2023-09-10 NOTE — Telephone Encounter (Signed)
 That is fine - I did pulmonology referral- ok for nephrology referral

## 2023-09-10 NOTE — Telephone Encounter (Signed)
 Already a message in chart to do pulmonology referral

## 2023-09-10 NOTE — Telephone Encounter (Signed)
 Copied from CRM [id number]. Topic: Referral - Question >> Sep 07, 2023  4:59 PM Sophia H wrote: Reason for CRM: Patient was calling in regarding a referral to a lung & kidney doctor. Please advise, only referral I see is to cardiology & patient states that is not what she is calling in about. Phone number [phone]

## 2023-09-12 ENCOUNTER — Encounter: Payer: Self-pay | Admitting: Family Medicine

## 2023-09-12 ENCOUNTER — Ambulatory Visit (INDEPENDENT_AMBULATORY_CARE_PROVIDER_SITE_OTHER): Admitting: Family Medicine

## 2023-09-12 VITALS — BP 103/69 | HR 81 | Temp 98.0°F | Ht 60.0 in | Wt 132.8 lb

## 2023-09-12 DIAGNOSIS — W57XXXA Bitten or stung by nonvenomous insect and other nonvenomous arthropods, initial encounter: Secondary | ICD-10-CM

## 2023-09-12 DIAGNOSIS — S80862A Insect bite (nonvenomous), left lower leg, initial encounter: Secondary | ICD-10-CM | POA: Diagnosis not present

## 2023-09-12 DIAGNOSIS — F411 Generalized anxiety disorder: Secondary | ICD-10-CM | POA: Diagnosis not present

## 2023-09-12 DIAGNOSIS — J449 Chronic obstructive pulmonary disease, unspecified: Secondary | ICD-10-CM

## 2023-09-12 DIAGNOSIS — F333 Major depressive disorder, recurrent, severe with psychotic symptoms: Secondary | ICD-10-CM | POA: Diagnosis not present

## 2023-09-12 MED ORDER — DOXYCYCLINE HYCLATE 100 MG PO TABS: 100.0000 mg | ORAL_TABLET | Freq: Two times a day (BID) | ORAL | 0 refills | Status: AC

## 2023-09-12 NOTE — Progress Notes (Signed)
 Subjective:  Patient ID: [name], female    DOB: [birth year], 48 y.o.   MRN: [id number]  Patient Care Team: Delfina Feller, FNP as PCP - General (Family Medicine) [facility] , Ellender Gust, MD (Inactive) as Consulting Physician (Psychiatry) Daralyn Earl, RN as [facility] Care Management   Chief Complaint:  Insect Bite (Tick bite)   HPI: [name] is a 48 y.o. female presenting on 09/12/2023 for Insect Bite (Tick bite)  HPI States that she had a tick bite that she noticed 4-5 days ago. States that the site is red and there is a spot in the middle of it. Reports that it is itching and keeping her up at night. States that the tick was only on for a day. Denies symptoms of fever, nausea, vomiting, rash. She is using rubbing alcohol on it.   Of note she has COPD and respiratory failure dependent on oxygen . She has portable oxygen , but did not bring it with her. She states that as soon as she gets home she places oxygen  back on.   Relevant past medical, surgical, family, and social history reviewed and updated as indicated.  Allergies and medications reviewed and updated. Data reviewed: Chart in Epic.   Past Medical History:  Diagnosis Date   Anemia 08/19/2018   Asthma    Bipolar 1 disorder    Chronic low back pain 04/25/2017   Chronic neck pain 04/25/2017   Chronic obstructive pulmonary disease 11/28/2019   Chronic pain syndrome 03/05/2016   DDD (degenerative disc disease), cervical 05/27/2017   Dysuria 04/30/2020   Enlarged lymph node 05/30/2017   Seen on CTA- reactive vs sarcoidosis.   Essential hypertension 09/03/2015   Generalized anxiety disorder    H. 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 Surveillance colonoscopy in 2021 negative. Repeat next surveillance colonoscopy due in 5 years (2026).    Past Surgical History:  Procedure Laterality Date   ABDOMINAL HYSTERECTOMY     BLADDER SURGERY  2015   CHOLECYSTECTOMY      Social History   Socioeconomic History   Marital status: Legally Separated    Spouse name: Not on file   Number of children: 3   Years of education: 11   Highest education level: 11th grade  Occupational History   Occupation: Disability  Tobacco Use   Smoking status: Every Day    Current packs/day: 1.00    Types: Cigarettes   Smokeless tobacco: Never  Vaping Use   Vaping status: Never Used  Substance and Sexual Activity   Alcohol use: No   Drug use: No   Sexual activity: Not Currently    Birth control/protection: Surgical  Other Topics Concern   Not on file  Social History Narrative   Right handed   Drinks caffeine   One story home   Social Drivers of Health   Financial Resource Strain: Patient Declined (09/05/2023)   Received from [facility]   Overall Financial Resource Strain (CARDIA)    Difficulty of Paying Living Expenses: Patient declined  Food Insecurity: Patient Declined (09/05/2023)   Received from [facility]   Hunger Vital Sign    Worried About Running Out of Food in the Last Year: Patient declined    Ran Out of Food in the Last Year: Patient declined  Transportation Needs: Patient Declined (09/05/2023)   Received from [facility]   PRAPARE - Transportation    Lack of Transportation (Medical): Patient  declined    Lack of Transportation (Non-Medical): Patient declined  Physical Activity: Inactive (07/05/2022)   Exercise Vital Sign    Days of Exercise per Week: 0 days    Minutes of Exercise per Session: 0 min  Stress: No Stress Concern Present (07/05/2022)   [employer] of Occupational Health - Occupational Stress Questionnaire    Feeling of Stress : Only a little  Social Connections: Unknown (08/18/2023)   Social Connection and Isolation Panel [NHANES]    Frequency of Communication with Friends and Family: More than three times a week    Frequency of Social Gatherings with Friends and Family: Once a week    Attends Religious Services: Patient unable to answer    Active Member of Clubs or Organizations: No    Attends Banker Meetings: Never    Marital Status: Divorced  Catering manager Violence: Not At Risk (08/21/2023)   Humiliation, Afraid, Rape, and Kick questionnaire    Fear of Current or Ex-Partner: No    Emotionally Abused: No    Physically Abused: No    Sexually Abused: No    Outpatient Encounter Medications as of 09/12/2023  Medication Sig   albuterol  (VENTOLIN  HFA) 108 (90 Base) MCG/ACT inhaler Inhale 2 puffs into the lungs every 6 (six) hours as needed for wheezing or shortness of breath.   ALPRAZolam  (XANAX ) 1 MG tablet Take 1 mg by mouth 4 (four) times daily as needed.   baclofen  (LIORESAL ) 10 MG tablet Take 10 mg by mouth 3 (three) times daily as needed for muscle spasms.   Calcium Polycarbophil (FIBER-CAPS PO) Take 1 capsule by mouth daily.   desoximetasone  (TOPICORT ) 0.25 % cream Apply 1 Application topically 2 (two) times daily.   desvenlafaxine (PRISTIQ) 100 MG 24 hr tablet Take 100 mg by mouth every morning.   fluconazole  (DIFLUCAN ) 150 MG tablet Take 1 tablet (150 mg total) by mouth every three (3) days as needed.   FLUoxetine (PROZAC) 20 MG capsule Take 20 mg by mouth daily.   fluPHENAZine (PROLIXIN) 2.5 MG tablet Take 2.5 mg by mouth at bedtime.    guaiFENesin  (MUCINEX ) 600 MG 12 hr tablet Take 1 tablet (600 mg total) by mouth 2 (two) times daily.   mupirocin  ointment (BACTROBAN ) 2 % Apply 1 Application topically 2 (two) times daily.   naloxone  (NARCAN ) nasal spray 4 mg/0.1 mL Place 1 spray into the nose once. For opioid overdose   nicotine  (NICODERM CQ  - DOSED IN MG/24 HOURS) 21 mg/24hr patch Place 1 patch (21 mg total) onto the skin daily.   nystatin  (MYCOSTATIN ) 100000 UNIT/ML suspension Take 5 mLs (500,000 Units total) by mouth 4 (four) times daily.   OLANZapine (ZYPREXA) 10 MG tablet Take 10 mg by mouth at bedtime.   omeprazole  (PRILOSEC) 20 MG capsule Take 20 mg by mouth daily.   oxyCODONE -acetaminophen  (PERCOCET) 10-325  MG tablet Take 1 tablet by mouth 4 (four) times daily as needed.   QUEtiapine  (SEROQUEL ) 400 MG tablet Take 800 mg by mouth at bedtime.   No facility-administered encounter medications on file as of 09/12/2023.    Allergies  Allergen Reactions   Amoxicillin Nausea And Vomiting   Chantix [Varenicline] Other (See Comments)    Causes bad nightmares, hallucinations   Codeine Other (See Comments)    Shaking    Divalproex Sodium Other (See Comments)    sedation   Lithium Other (See Comments)    Drowsiness, tremors drowsiness   Oxycodone -Acetaminophen  Other (See Comments)    Shaking, jitters   Penicillins Other (See Comments)    Reaction type/severity unknown   Prednisone  Other (See Comments)    Shaking, jitters, altered mood   Propoxyphene Nausea And Vomiting   Augmentin [Amoxicillin-Pot Clavulanate] Diarrhea and Other (See Comments)   Zolpidem Other (See Comments)    Patient states it does not agree with her body.     Review of Systems As per HPI  Objective:  BP 103/69   Pulse 81   Temp 98 F (36.7 C)   Ht 5' (1.524 m)   Wt 132 lb 12.8 oz (60.2 kg)   SpO2 95%   BMI 25.94 kg/m    Wt Readings from Last 3 Encounters:  09/12/23 132 lb 12.8 oz (60.2 kg)  09/04/23 131 lb (59.4 kg)  08/23/23 127 lb  12.8 oz (58 kg)    Physical Exam Constitutional:      General: She is awake. She is not in acute distress.    Appearance: Normal appearance. She is well-developed and well-groomed. She is not ill-appearing, toxic-appearing or diaphoretic.  HENT:     Head:     Comments: Bandage across nose Cardiovascular:     Rate and Rhythm: Normal rate and regular rhythm.     Heart sounds: Normal heart sounds.  Pulmonary:     Effort: Prolonged expiration present.     Breath sounds: Transmitted upper airway sounds present. No decreased breath sounds, wheezing, rhonchi or rales.     Comments: Increased work of breathing  Hyperresonance  Skin:    General: Skin is warm.     Capillary Refill: Capillary refill takes less than 2 seconds.          Comments: Erythematous lesion with dry flaky skin   Neurological:     General: No focal deficit present.     Mental Status: She is alert, oriented to person, place, and time and easily aroused. Mental status is at baseline.  Psychiatric:        Attention and Perception: Attention and perception normal.        Mood and Affect: Mood and affect normal.        Speech: Speech normal.        Behavior: Behavior normal. Behavior is cooperative.        Thought Content: Thought content normal.        Cognition and Memory: Cognition and memory normal.     Results for orders placed or performed in visit on 09/04/23  CBC with Differential/Platelet   Collection Time: 09/04/23 12:09 PM  Result Value Ref Range   WBC 13.0 (H) 3.4 - 10.8 x10E3/uL   RBC 4.13 3.77 - 5.28 x10E6/uL   Hemoglobin 13.7 11.1 - 15.9 g/dL   Hematocrit 16.1 09.6 - 46.6 %   MCV 101 (H) 79 - 97 fL   MCH 33.2 (H) 26.6 - 33.0 pg  MCHC 32.9 31.5 - 35.7 g/dL   RDW 09.8 11.9 - 14.7 %   Platelets 204 150 - 450 x10E3/uL   Neutrophils 59 Not Estab. %   Lymphs 28 Not Estab. %   Monocytes 9 Not Estab. %   Eos 2 Not Estab. %   Basos 1 Not Estab. %   Neutrophils Absolute 7.8 (H) 1.4 - 7.0 x10E3/uL    Lymphocytes Absolute 3.6 (H) 0.7 - 3.1 x10E3/uL   Monocytes Absolute 1.2 (H) 0.1 - 0.9 x10E3/uL   EOS (ABSOLUTE) 0.2 0.0 - 0.4 x10E3/uL   Basophils Absolute 0.1 0.0 - 0.2 x10E3/uL   Immature Granulocytes 1 Not Estab. %   Immature Grans (Abs) 0.2 (H) 0.0 - 0.1 x10E3/uL       09/12/2023   11:33 AM 08/21/2023    3:25 PM 06/12/2023    2:53 PM 01/29/2023    2:12 PM 07/05/2022    2:34 PM  Depression screen PHQ 2/9  Decreased Interest 1 1 1 1 1   Down, Depressed, Hopeless 2 0 0 1 1  PHQ - 2 Score 3 1 1 2 2   Altered sleeping 2   0 0  Tired, decreased energy 1   1 1   Change in appetite 1   0 0  Feeling bad or failure about yourself  1   2 1   Trouble concentrating 1   1 0  Moving slowly or fidgety/restless 1   0 0  Suicidal thoughts 0   0 0  PHQ-9 Score 10   6 4   Difficult doing work/chores Somewhat difficult   Not difficult at all        09/12/2023   11:34 AM 01/29/2023    2:12 PM 07/04/2022   11:41 AM 05/31/2021   11:32 AM  GAD 7 : Generalized Anxiety Score  Nervous, Anxious, on Edge 1 1 0 0  Control/stop worrying 1 1 1  0  Worry too much - different things 1 1 1  0  Trouble relaxing 1 0 0 0  Restless 1 0 0 0  Easily annoyed or irritable 1 1 3  0  Afraid - awful might happen 2 0 1 0  Total GAD 7 Score 8 4 6  0  Anxiety Difficulty Somewhat difficult Somewhat difficult Somewhat difficult Not difficult at all   Pertinent labs & imaging results that were available during my care of the patient were reviewed by me and considered in my medical decision making.  Assessment & Plan:  Sharia was seen today for insect bite.  Diagnoses and all orders for this visit: 1. Tick bite of left lower leg, initial encounter (Primary) Will start medication as below. Discussed with patient use of OTC benadryl  cream to help with symptoms of itching at night. Clean with antibacterial soap.  - doxycycline  (VIBRA -TABS) 100 MG tablet; Take 1 tablet (100 mg total) by mouth 2 (two) times daily for 10 days.   Dispense: 20 tablet; Refill: 0  2. Chronic obstructive pulmonary disease, unspecified COPD type (HCC) O2 improved through visit. Patient has follow up planned with specialty. Continue to follow with specialty.   3. Generalized anxiety disorder Stable. Denies SI. Follow up with PCP.   4. Severe episode of recurrent major depressive disorder, with psychotic features (HCC) As above.   Continue all other maintenance medications.  Follow up plan: Return if symptoms worsen or fail to improve.   Continue healthy lifestyle choices, including diet (rich in fruits, vegetables, and lean proteins, and low in salt and simple carbohydrates)  and exercise (at least 30 minutes of moderate physical activity daily).  Written and verbal instructions provided   The above assessment and management plan was discussed with the patient. The patient verbalized understanding of and has agreed to the management plan. Patient is aware to call the clinic if they develop any new symptoms or if symptoms persist or worsen. Patient is aware when to return to the clinic for a follow-up visit. Patient educated on when it is appropriate to go to the emergency department.   Jacqualyn Mates, DNP-FNP [facility] [address] [place], [place] [address] [phone]

## 2023-09-13 ENCOUNTER — Other Ambulatory Visit: Payer: Self-pay | Admitting: *Deleted

## 2023-09-13 NOTE — Patient Outreach (Signed)
 Transition of Care week 4  Visit Note  09/13/2023  Name: [name] MRN: [id number]          DOB: 01-16-1976  Situation: Patient enrolled in [facility] 30-day program. Visit completed with patient by telephone.   Background:    Past Medical History:  Diagnosis Date   Anemia 08/19/2018   Asthma    Bipolar 1 disorder    Chronic low back pain 04/25/2017   Chronic neck pain 04/25/2017   Chronic obstructive pulmonary disease 11/28/2019   Chronic pain syndrome 03/05/2016   DDD (degenerative disc disease), cervical 05/27/2017   Dysuria 04/30/2020   Enlarged lymph node 05/30/2017   Seen on CTA- reactive vs sarcoidosis.   Essential hypertension 09/03/2015   Generalized anxiety disorder    H. 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 Surveillance colonoscopy in 2021 negative. Repeat next surveillance colonoscopy due in 5 years (2026).    Assessment: Patient Reported Symptoms: Cognitive Cognitive Status: Able to follow simple commands, Alert and oriented to person, place, and time, Normal speech and language skills      Neurological Neurological Review of Symptoms: No symptoms reported    HEENT HEENT Symptoms Reported: No symptoms reported      Cardiovascular Cardiovascular Symptoms Reported: No symptoms reported    Respiratory Respiratory Symptoms Reported: No  symptoms reported Respiratory Conditions: COPD Respiratory Self-Management Outcome: 3 (uncertain) Respiratory Comment: oxygen  at 2 liters 24/7   pt states oxygen  saturations above 90  Endocrine Patient reports the following symptoms related to hypoglycemia or hyperglycemia : No symptoms reported    Gastrointestinal Gastrointestinal Symptoms Reported: No symptoms reported      Genitourinary Genitourinary Symptoms Reported: No symptoms reported Genitourinary Conditions: Chronic kidney disease Genitourinary Management Strategies: Adequate rest Genitourinary Self-Management Outcome: 3 (uncertain)  Integumentary Integumentary Symptoms Reported: Skin changes Additional Integumentary Details: pt had tick bite few days ago, redness at site, on anbibiotic Skin Self-Management Outcome: 3 (uncertain) Skin Comment: pt on doxycycline   Musculoskeletal Musculoskelatal Symptoms Reviewed: No symptoms reported        Psychosocial Psychosocial Symptoms Reported: No symptoms reported         There were no vitals filed for this visit.  Medications Reviewed Today     Reviewed by Daralyn Earl, RN (Registered Nurse) on 09/13/23 at 1340  Med List Status: <None>   Medication Order Taking? Sig Documenting Provider Last Dose Status Informant  albuterol  (VENTOLIN  HFA) 108 (90 Base) MCG/ACT inhaler [id number] No Inhale 2 puffs into the lungs every 6 (six) hours as needed for wheezing or shortness of breath. Pokhrel, Laxman, MD Taking Active   ALPRAZolam  (XANAX ) 1 MG tablet [id number] No Take 1 mg by mouth 4 (four) times daily as needed. [provider] Taking Active Self, Pharmacy Records  baclofen  (LIORESAL ) 10 MG tablet [id number] No Take 10 mg by mouth  3 (three) times daily as needed for muscle spasms. [provider] Taking Active Self, Pharmacy Records  Calcium Polycarbophil (FIBER-CAPS PO) [id number] No Take 1 capsule by mouth daily. [provider] Taking Active Self, Pharmacy  Records  desoximetasone  (TOPICORT ) 0.25 % cream [id number] No Apply 1 Application topically 2 (two) times daily. Delfina Feller, FNP Taking Active Self, Pharmacy Records  desvenlafaxine (PRISTIQ) 100 MG 24 hr tablet [id number] No Take 100 mg by mouth every morning. [provider] Taking Active Self, Pharmacy Records  doxycycline  (VIBRA -TABS) 100 MG tablet [id number]  Take 1 tablet (100 mg total) by mouth 2 (two) times daily for 10 days. Chrystine Crate, FNP  Active   fluconazole  (DIFLUCAN ) 150 MG tablet [id number] No Take 1 tablet (150 mg total) by mouth every three (3) days as needed. Yevette Hem, FNP Taking Active   FLUoxetine (PROZAC) 20 MG capsule [id number] No Take 20 mg by mouth daily. [provider] Taking Active Self, Pharmacy Records  fluPHENAZine (PROLIXIN) 2.5 MG tablet [id number] No Take 2.5 mg by mouth at bedtime. [provider] Taking Active Self, Pharmacy Records  guaiFENesin  (MUCINEX ) 600 MG 12 hr tablet [id number] No Take 1 tablet (600 mg total) by mouth 2 (two) times daily. Pokhrel, Laxman, MD Taking Active   mupirocin  ointment (BACTROBAN ) 2 % [id number] No Apply 1 Application topically 2 (two) times daily. Paci, Karina M, MD Taking Active   naloxone  (NARCAN ) nasal spray 4 mg/0.1 mL [id number] No Place 1 spray into the nose once. For opioid overdose [provider] Taking Active Self, Pharmacy Records           Med Note Cornelia Dieter, SEBASTIAN   Sun Aug 19, 2023  3:12 PM) PRN, no recent need/use  nicotine  (NICODERM CQ  - DOSED IN MG/24 HOURS) 21 mg/24hr patch [id number] No Place 1 patch (21 mg total) onto the skin daily. Pokhrel, Laxman, MD Taking Active   nystatin  (MYCOSTATIN ) 100000 UNIT/ML suspension [id number] No Take 5 mLs (500,000 Units total) by mouth 4 (four) times daily. Yevette Hem, FNP Taking Active   OLANZapine (ZYPREXA) 10 MG tablet [id number] No Take 10 mg by mouth at bedtime. [provider] Taking Active Self,  Pharmacy Records           Med Note Cornelia Dieter, Arther Bill Aug 19, 2023  3:13 PM) Patient reports she was told to stop this medication, [facility] has on autofill and continues to dispense.   omeprazole  (PRILOSEC) 20 MG capsule [id number] No Take 20 mg by mouth daily. [provider] Taking Active Self, Pharmacy Records  oxyCODONE -acetaminophen  (PERCOCET) 10-325 MG tablet [id number] No Take 1 tablet by mouth 4 (four) times daily as needed. [provider] Taking Active Self, Pharmacy Records  QUEtiapine  (SEROQUEL ) 400 MG tablet [id number] No Take 800 mg by mouth at bedtime. [provider] Taking Active Self, Pharmacy Records            Recommendation:   PCP Follow-up  Follow Up Plan:   Telephone follow-up 09/20/23 @ 130 pm with Katheryn Pandy RN  Cecilie Coffee [facility], BSN RN Care Manager/ Transition of Care [place]/ [facility] [phone]

## 2023-09-13 NOTE — Patient Instructions (Signed)
 Visit Information  Thank you for taking time to visit with me today. Please don't hesitate to contact me if I can be of assistance to you before our next scheduled telephone appointment.  Our next appointment is by telephone on 09/20/23 @ 130 pm  Following is a copy of your care plan:   Goals Addressed             This Visit's Progress    VBCI Transitions of Care (TOC) Care Plan       Problems:  Recent Hospitalization for treatment of CHF and COPD No Hospital Follow Up Provider appointment Careguide made appointment for [id number] With NP  Hawks Patient saw primary care provider 5/22, pt states she called nurse triage on 5/28 but did not receive a response, states she is coughing up brown, thick phlegm, slight improvement today, no fever, no other symptoms reported, pt stopped taking mucinex  but states she will start again, pt states she is not smoking, pt is wearing oxygen  at 2 liters 24/7. 09/05/23- pt states she is no longer coughing up phlegm, pt is taking mucinex , continues on oxygen  at 2 liters 24/7, saw primary care provider 09/04/23, pt reports she saw cardiologist at [facility] today and still wants referral for pulmonary and states  they were waiting for me to go to cardiologist to see what they say 09/13/23- pt states she saw primary care provider yesterday for tick bite (appeared red at the site), now on doxycycline , states  already looks better, pt states breathing so much better, no new concerns reported  Goal:  Over the next 30 days, the patient will not experience hospital readmission  Interventions:   Heart Failure Interventions: Provided education on low sodium diet Discussed the importance of keeping all appointments with provider Reinforced Heart Failure action plan Reviewed importance of daily weights and recording, ask pt to obtain scales  COPD Interventions: Assessed social determinant of health barriers Discussed the importance of adequate rest and management of  fatigue with COPD Use of home oxygen  Reinforced oxygen  safety Reinforced signs /symptoms of infection Reinforced COPD action plan Reviewed referrals placed on 09/10/23 by primary care provider - for nephrology and pulmonary  Patient Self Care Activities:  Attend all scheduled provider appointments Call pharmacy for medication refills 3-7 days in advance of running out of medications Call provider office for new concerns or questions  Participate in Transition of Care Program/Attend TOC scheduled calls Perform all self care activities independently  Perform IADL's (shopping, preparing meals, housekeeping, managing finances) independently Take medications as prescribed   use salt in moderation eliminate smoking in my home eliminate symptom triggers at home eat healthy/prescribed diet: low sodium heart healthy Wear oxygen  and monitor saturations Alternate activity with rest Call your doctor for any signs of infection, fever  Plan:  Telephone follow up appointment with care management team member scheduled for:  09/20/23 @ 130 pm with Katheryn Pandy RN                    The patient verbalized understanding of instructions, educational materials, and care plan provided today and DECLINED offer to receive copy of patient instructions, educational materials, and care plan.   Telephone follow up appointment with care management team member scheduled for:  Please call the care guide team at [phone] if you need to cancel or reschedule your appointment.   Please call the Suicide and Crisis Lifeline: 988 call the [place]  National Suicide Prevention Lifeline: 907-311-0205 or TTY: 313-588-8671 TTY 914-686-9248)  to talk to a trained counselor call 1-800-273-TALK (toll free, 24 hour hotline) go to [facility] [address] [place] [phone]) call the [facility] Line: [phone] call 911 if you are experiencing a Mental Health or  Behavioral Health Crisis or need someone to talk to.  Cecilie Coffee [facility], BSN RN Care Manager/ Transition of Care [place]/ [facility] [phone]

## 2023-09-20 ENCOUNTER — Other Ambulatory Visit: Payer: Self-pay

## 2023-09-20 NOTE — Transitions of Care (Post Inpatient/ED Visit) (Signed)
 Transition of Care 5  Visit Note  09/20/2023  Name: [name] MRN: [id number]          DOB: 08-21-1975  Situation: Patient enrolled in [facility] 30-day program. Visit completed with pateint by telephone.   Background:   Initial Transition Care Management Follow-up Telephone Call    Past Medical History:  Diagnosis Date   Anemia 08/19/2018   Asthma    Bipolar 1 disorder    Chronic low back pain 04/25/2017   Chronic neck pain 04/25/2017   Chronic obstructive pulmonary disease 11/28/2019   Chronic pain syndrome 03/05/2016   DDD (degenerative disc disease), cervical 05/27/2017   Dysuria 04/30/2020   Enlarged lymph node 05/30/2017   Seen on CTA- reactive vs sarcoidosis.   Essential hypertension 09/03/2015   Generalized anxiety disorder    H. 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 Surveillance colonoscopy in 2021 negative. Repeat next surveillance colonoscopy due in 5 years (2026).    Assessment: Patient Reported Symptoms: Cognitive Cognitive Status: Able to follow simple commands, Alert and oriented to person, place, and time, Normal speech and language skills      Neurological Neurological Review of Symptoms: No symptoms reported    HEENT HEENT Symptoms Reported: No symptoms reported      Cardiovascular Cardiovascular Symptoms Reported: No symptoms  reported Weight:  (Today she just stated she has not gained weight.) Cardiovascular Self-Management Outcome: 4 (good)  Respiratory Respiratory Symptoms Reported: No symptoms reported Other Respiratory Symptoms: No more productive cough, no shortness of breath while on oxygen . Respiratory Conditions: COPD Respiratory Self-Management Outcome: 4 (good)  Endocrine Patient reports the following symptoms related to hypoglycemia or hyperglycemia : No symptoms reported Is patient diabetic?: No    Gastrointestinal Gastrointestinal Symptoms Reported: No symptoms reported   Nutrition Risk Screen (CP): No indicators present  Genitourinary Genitourinary Symptoms Reported: No symptoms reported Genitourinary Conditions: Chronic kidney disease Genitourinary Management Strategies: Adequate rest  Integumentary Integumentary Symptoms Reported: No symptoms reported Additional Integumentary Details: A few more days on antibiotics but no newer issues or symptoms reported Skin Self-Management Outcome: 4 (good)  Musculoskeletal Musculoskelatal Symptoms Reviewed: No symptoms reported Musculoskeletal Management Strategies: Adequate rest, Medication therapy, Routine screening Musculoskeletal Self-Management Outcome: 4 (good) Falls in the past year?: No (none reported today) Patient at Risk for Falls Due to: No Fall Risks  Psychosocial Psychosocial Symptoms Reported: No symptoms reported   Major Change/Loss/Stressor/Fears (CP): Denies Quality of Family Relationships: supportive Do you feel physically threatened by others?: No   Vitals:    Medications Reviewed Today     Reviewed by Areta Beer, RN (Case Manager) on 09/20/23 at 1359  Med List Status: <None>   Medication Order Taking? Sig Documenting Provider Last Dose Status Informant  albuterol  (VENTOLIN  HFA) 108 (90 Base) MCG/ACT inhaler [id number] Yes Inhale 2 puffs into the lungs every  6 (six) hours as needed for wheezing or shortness of breath.  Pokhrel, Laxman, MD  Active   ALPRAZolam  (XANAX ) 1 MG tablet [id number] Yes Take 1 mg by mouth 4 (four) times daily as needed. [provider]  Active Self, Pharmacy Records  baclofen  (LIORESAL ) 10 MG tablet [id number] Yes Take 10 mg by mouth 3 (three) times daily as needed for muscle spasms. [provider]  Active Self, Pharmacy Records  Calcium Polycarbophil (FIBER-CAPS PO) [id number] Yes Take 1 capsule by mouth daily. [provider]  Active Self, Pharmacy Records  desoximetasone  (TOPICORT ) 0.25 % cream [id number] Yes Apply 1 Application topically 2 (two) times daily. Gaylyn Keas Mary-Margaret, FNP  Active Self, Pharmacy Records  desvenlafaxine (PRISTIQ) 100 MG 24 hr tablet [id number] Yes Take 100 mg by mouth every morning. [provider]  Active Self, Pharmacy Records  doxycycline  (VIBRA -TABS) 100 MG tablet [id number] Yes Take 1 tablet (100 mg total) by mouth 2 (two) times daily for 10 days. Chrystine Crate, FNP  Active   fluconazole  (DIFLUCAN ) 150 MG tablet [id number] Yes Take 1 tablet (150 mg total) by mouth every three (3) days as needed. Tommas Fragmin A, FNP  Active   FLUoxetine (PROZAC) 20 MG capsule [id number] Yes Take 20 mg by mouth daily. [provider]  Active Self, Pharmacy Records  fluPHENAZine (PROLIXIN) 2.5 MG tablet [id number] Yes Take 2.5 mg by mouth at bedtime. [provider]  Active Self, Pharmacy Records  guaiFENesin  (MUCINEX ) 600 MG 12 hr tablet [id number] Yes Take 1 tablet (600 mg total) by mouth 2 (two) times daily. Pokhrel, Laxman, MD  Active   mupirocin  ointment (BACTROBAN ) 2 % [id number] Yes Apply 1 Application topically 2 (two) times daily. Paci, Karina M, MD  Active   naloxone  (NARCAN ) nasal spray 4 mg/0.1 mL [id number]  Place 1 spray into the nose once. For opioid overdose [provider]  Active Self, Pharmacy Records           Med Note Parke Boll, Morrell Aran   Thu Sep 20, 2023  1:58 PM) PRN, has not needed.    nicotine  (NICODERM CQ  - DOSED IN MG/24 HOURS) 21 mg/24hr patch [id number] Yes Place 1 patch (21 mg total) onto the skin daily. Pokhrel, Laxman, MD  Active   nystatin  (MYCOSTATIN ) 100000 UNIT/ML suspension [id number] Yes Take 5 mLs (500,000 Units total) by mouth 4 (four) times daily. Tommas Fragmin A, FNP  Active   OLANZapine (ZYPREXA) 10 MG tablet [id number] Yes Take 10 mg by mouth at bedtime. [provider]  Active Self, Pharmacy Records           Med Note Cornelia Dieter, Arther Bill Aug 19, 2023  3:13 PM) Patient reports she was told to stop this medication, [facility] has on autofill and continues to dispense.   omeprazole  (PRILOSEC) 20 MG capsule [id number] Yes Take 20 mg by mouth daily. [provider]  Active Self, Pharmacy Records  oxyCODONE -acetaminophen  (PERCOCET) 10-325 MG tablet [id number] Yes Take 1 tablet by mouth 4 (four) times daily as needed. [provider]  Active Self, Pharmacy Records  QUEtiapine  (SEROQUEL ) 400 MG tablet [id number] Yes Take 800 mg by mouth at bedtime. [provider]  Active Self, Pharmacy Records          No new issues, symptoms, or concerns reported except pending referrals to nephrology and pulmonary specialists. Goals met but one more call to check on status of those referrals next week. Patient also to call PCP to check on status.  Recommendation:   Continue Current Plan of Care Follow up with PCP on status of referrals.   Follow Up Plan:   Telephone follow-up in 1 week 09/27/23 at 130 pm with Julie Farmet.    Katheryn Pandy MSN, RN RN Case Sales executive Health  [facility]BC[facility]Population Health Office Hours M-F [phone] Direct Dial: [phone] Main Phone [phone]  Fax: [phone] [email or web address]

## 2023-09-20 NOTE — Patient Instructions (Signed)
 Visit Information  Thank you for taking time to visit with me today. Please don't hesitate to contact me if I can be of assistance to you before our next scheduled telephone appointment.  Our next appointment is by telephone on 09/27/23 at 130 pm  Following is a copy of your care plan:   Goals Addressed             This Visit's Progress    VBCI Transitions of Care (TOC) Care Plan   On track    Problems: (Reviewed or updated progress 09/30/23) Recent Hospitalization for treatment of CHF and COPD 09/05/23- pt states she is no longer coughing up phlegm, pt is taking mucinex , continues on oxygen  at 2 liters 24/7,  09/13/23- pt states she saw primary care provider yesterday for tick bite (appeared red at the site), now on doxycycline , states  already looks better, pt states breathing so much better, no new concerns reported 09/20/23: A few more days of antibiotic for tick bite but patient stated no other symptoms or issues. No more productive coughing for [facility], states no weight gain.   Goal: (Reviewed or updated progress 09/30/23) Over the next 30 days, the patient will not experience hospital readmission (Goal completed as today 09/20/23).   Interventions: (Reviewed or updated progress 09/30/23)  Heart Failure Interventions:(Reviewed or updated progress 09/30/23) Provided education on low sodium diet Discussed the importance of keeping all appointments with provider Reinforced Heart Failure action plan Reviewed importance of daily weights and recording, ask pt to obtain scales 09/20/23: Patient stated she has not gained weight.   COPD Interventions:(Reviewed or updated progress 09/30/23) Assessed social determinant of health barriers Discussed the importance of adequate rest and management of fatigue with COPD Use of home oxygen  Reinforced oxygen  safety [employer] is oxygen  supply agency reported 09/20/23.  Patient monitors SpO2 at home, reported in the 90s% today, 09/20/2023, on 2L/[place]  continuous oxygen  and reports no SOB.  Reinforced signs /symptoms of infection: None reported, few more days on antibiotic for tick bite.  Reinforced COPD action plan: reported no SHOB on 2L/[place] continuous oxygen  09/20/23.  Reviewed referrals placed on 09/10/23 by primary care provider:  nephrology and pulmonary remain pending as of 09/20/23 and encouraged patient to contact PCP to ensure referrals received.   Patient Self Care Activities: (Reviewed or updated progress 09/30/23) Attend all scheduled provider appointments Call pharmacy for medication refills 3-7 days in advance of running out of medications Call provider office for new concerns or questions  Wants to think about referral to longitudinal CCM. 09/20/23 Perform all self care activities independently  Perform IADL's (shopping, preparing meals, housekeeping, managing finances) independently Take medications as prescribed, take all of the antibiotic prescribed until completed.  use salt in moderation eliminate smoking in my home eliminate symptom triggers at home eat healthy/prescribed diet: low sodium heart healthy Wear oxygen  and monitor saturations Alternate activity with rest Call your doctor for any signs of infection, fever  Plan:  One final call to check on status of referrals before closing or CCM referral, on Aug 27, 2023 at 130 pm with Cecilie Coffee RN CM                 The patient verbalized understanding of instructions, educational materials, and care plan provided today and agreed to receive a mailed copy of patient instructions, educational materials, and care plan.   Telephone follow up appointment with care management team member scheduled for: Follow up with provider re: Status of referrals made  09/10/23.   Please call the care guide team at [phone] if you need to cancel or reschedule your appointment.   Please call the Suicide and Crisis Lifeline: 988 call the [place]  National Suicide Prevention Lifeline:  8030906094 or TTY: 570-407-8757 TTY 787-543-9651) to talk to a trained counselor if you are experiencing a Mental Health or Behavioral Health Crisis or need someone to talk to.   Katheryn Pandy MSN, RN RN Case Sales executive Health  [facility]BCI-Population Health Office Hours M-F [phone] Direct Dial: [phone] Main Phone [phone]  Fax: [phone] [email or web address]

## 2023-09-25 ENCOUNTER — Ambulatory Visit: Admitting: Dermatology

## 2023-09-25 ENCOUNTER — Encounter: Payer: Self-pay | Admitting: Dermatology

## 2023-09-25 DIAGNOSIS — T1490XD Injury, unspecified, subsequent encounter: Secondary | ICD-10-CM

## 2023-09-25 DIAGNOSIS — S0120XA Unspecified open wound of nose, initial encounter: Secondary | ICD-10-CM | POA: Diagnosis not present

## 2023-09-25 DIAGNOSIS — L539 Erythematous condition, unspecified: Secondary | ICD-10-CM

## 2023-09-25 DIAGNOSIS — Z85828 Personal history of other malignant neoplasm of skin: Secondary | ICD-10-CM | POA: Diagnosis not present

## 2023-09-25 DIAGNOSIS — C4492 Squamous cell carcinoma of skin, unspecified: Secondary | ICD-10-CM

## 2023-09-25 DIAGNOSIS — L905 Scar conditions and fibrosis of skin: Secondary | ICD-10-CM

## 2023-09-25 NOTE — Progress Notes (Signed)
   Follow Up Visit   Subjective  [name] is a 48 y.o. female who presents for the following: follow up from Mohs surgery   The patient presents for follow up from Mohs surgery for a SCC on the head-anterior face, treated on 08/21/23, repaired with 2nd intention. The patient has been bandaging the wound as directed. The endorse the following concerns: none  The following portions of the chart were reviewed this encounter and updated as appropriate: medications, allergies, medical history  Review of Systems:  No other skin or systemic complaints except as noted in HPI or Assessment and Plan.  Objective  Well appearing patient in no apparent distress; mood and affect are within normal limits.  A full examination was performed including scalp, head, face. All findings within normal limits unless otherwise noted below.  Healing wound with mild erythema  Relevant physical exam findings are noted in the Assessment and Plan.     Assessment & Plan   Healing wound s/p Mohs for SCC right nasal sidewall, treated on 08/21/23, healing by 2nd intention - Reassured that wound is healing well - No evidence of infection - No swelling, induration, purulence, dehiscence, or tenderness out of proportion to the clinical exam, see photo above - Discussed that scars take up to 12 months to mature from the date of surgery - Recommend SPF 30+ to scar daily to prevent purple color from UV exposure during scar maturation process - Discussed that erythema and raised appearance of scar will fade over the next 4-6 months - OK to start scar massage at 4-6 weeks post-op - Can consider silicone based products for scar healing starting at 6 weeks post-op - Ok to stop ointment - Could consider dermabrasion in the future  HISTORY OF SQUAMOUS CELL CARCINOMA OF THE SKIN - No evidence of recurrence today - Recommend regular full body skin exams - Recommend daily broad spectrum sunscreen SPF 30+ to  sun-exposed areas, reapply every 2 hours as needed.  - Call if any new or changing lesions are noted between office visits   Return in about 6 months (around 03/26/2024) for wound check.  I, Darice Smock, CMA, am acting as scribe for RUFUS CHRISTELLA HOLY, MD.   Documentation: I have reviewed the above documentation for accuracy and completeness, and I agree with the above.  RUFUS CHRISTELLA HOLY, MD

## 2023-09-25 NOTE — Patient Instructions (Addendum)

## 2023-09-26 ENCOUNTER — Ambulatory Visit: Admitting: Dermatology

## 2023-09-26 ENCOUNTER — Encounter: Payer: Self-pay | Admitting: Dermatology

## 2023-09-26 VITALS — BP 113/72 | HR 97

## 2023-09-26 DIAGNOSIS — L821 Other seborrheic keratosis: Secondary | ICD-10-CM

## 2023-09-26 DIAGNOSIS — D492 Neoplasm of unspecified behavior of bone, soft tissue, and skin: Secondary | ICD-10-CM

## 2023-09-26 DIAGNOSIS — D485 Neoplasm of uncertain behavior of skin: Secondary | ICD-10-CM

## 2023-09-26 DIAGNOSIS — D229 Melanocytic nevi, unspecified: Secondary | ICD-10-CM

## 2023-09-26 DIAGNOSIS — L814 Other melanin hyperpigmentation: Secondary | ICD-10-CM

## 2023-09-26 DIAGNOSIS — D1801 Hemangioma of skin and subcutaneous tissue: Secondary | ICD-10-CM | POA: Diagnosis not present

## 2023-09-26 DIAGNOSIS — K130 Diseases of lips: Secondary | ICD-10-CM

## 2023-09-26 DIAGNOSIS — L578 Other skin changes due to chronic exposure to nonionizing radiation: Secondary | ICD-10-CM

## 2023-09-26 DIAGNOSIS — Z1283 Encounter for screening for malignant neoplasm of skin: Secondary | ICD-10-CM

## 2023-09-26 DIAGNOSIS — Z86007 Personal history of in-situ neoplasm of skin: Secondary | ICD-10-CM

## 2023-09-26 NOTE — Patient Instructions (Addendum)
 Important Information  Due to recent changes in healthcare laws, you may see results of your pathology and/or laboratory studies on MyChart before the doctors have had a chance to review them. We understand that in some cases there may be results that are confusing or concerning to you. Please understand that not all results are received at the same time and often the doctors may need to interpret multiple results in order to provide you with the best plan of care or course of treatment. Therefore, we ask that you please give [place] 2 business days to thoroughly review all your results before contacting the office for clarification. Should we see a critical lab result, you will be contacted sooner.   If You Need Anything After Your Visit  If you have any questions or concerns for your doctor, please call our main line at [phone] If no one answers, please leave a voicemail as directed and we will return your call as soon as possible. Messages left after 4 pm will be answered the following business day.   You may also send [place] a message via MyChart. We typically respond to MyChart messages within 1-2 business days.  For prescription refills, please ask your pharmacy to contact our office. Our fax number is [phone].  If you have an urgent issue when the clinic is closed that cannot wait until the next business day, you can page your doctor at the number below.    Please note that while we do our best to be available for urgent issues outside of office hours, we are not available 24/7.   If you have an urgent issue and are unable to reach [place], you may choose to seek medical care at your doctor's office, retail clinic, urgent care center, or emergency room.  If you have a medical emergency, please immediately call 911 or go to the emergency department. In the event of inclement weather, please call our main line at [phone] for an update on the status of any delays or  closures.  Dermatology Medication Tips: Please keep the boxes that topical medications come in in order to help keep track of the instructions about where and how to use these. Pharmacies typically print the medication instructions only on the boxes and not directly on the medication tubes.   If your medication is too expensive, please contact our office at [phone] or send [place] a message through MyChart.   We are unable to tell what your co-pay for medications will be in advance as this is different depending on your insurance coverage. However, we may be able to find a substitute medication at lower cost or fill out paperwork to get insurance to cover a needed medication.   If a prior authorization is required to get your medication covered by your insurance company, please allow [place] 1-2 business days to complete this process.  Drug prices often vary depending on where the prescription is filled and some pharmacies may offer cheaper prices.  The website www.goodrx.com contains coupons for medications through different pharmacies. The prices here do not account for what the cost may be with help from insurance (it may be cheaper with your insurance), but the website can give you the price if you did not use any insurance.  - You can print the associated coupon and take it with your prescription to the pharmacy.  - You may also stop by our office during regular business hours and pick up a GoodRx coupon card.  - If  you need your prescription sent electronically to a different pharmacy, notify our office through [facility] or by phone at [phone]     Patient Handout: Wound Care for Skin Biopsy Site  Taking Care of Your Skin Biopsy Site  Proper care of the biopsy site is essential for promoting healing and minimizing scarring. This handout provides instructions on how to care for your biopsy site to ensure optimal recovery.  1. Cleaning the Wound:  Clean the biopsy site daily  with gentle soap and water. Gently pat the area dry with a clean, soft towel. Avoid harsh scrubbing or rubbing the area, as this can irritate the skin and delay healing.  2. Applying Aquaphor and Bandage:  After cleaning the wound, apply a thin layer of Aquaphor ointment to the biopsy site. Cover the area with a sterile bandage to protect it from dirt, bacteria, and friction. Change the bandage daily or as needed if it becomes soiled or wet.  3. Continued Care for One Week:  Repeat the cleaning, Aquaphor application, and bandaging process daily for one week following the biopsy procedure. Keeping the wound clean and moist during this initial healing period will help prevent infection and promote optimal healing.  4. Massaging Aquaphor into the Area:  ---After one week, discontinue the use of bandages but continue to apply Aquaphor to the biopsy site. ----Gently massage the Aquaphor into the area using circular motions. ---Massaging the skin helps to promote circulation and prevent the formation of scar tissue.   Additional Tips:  Avoid exposing the biopsy site to direct sunlight during the healing process, as this can cause hyperpigmentation or worsen scarring. If you experience any signs of infection, such as increased redness, swelling, warmth, or drainage from the wound, contact your healthcare provider immediately. Follow any additional instructions provided by your healthcare provider for caring for the biopsy site and managing any discomfort. Conclusion:  Taking proper care of your skin biopsy site is crucial for ensuring optimal healing and minimizing scarring. By following these instructions for cleaning, applying Aquaphor, and massaging the area, you can promote a smooth and successful recovery. If you have any questions or concerns about caring for your biopsy site, don't hesitate to contact your healthcare provider for guidance.

## 2023-09-26 NOTE — Progress Notes (Signed)
   Total Body Skin Exam (TBSE) Visit   Subjective  [name] is a 48 y.o. female who presents for the following: Skin Cancer Screening and Full Body Skin Exam  Patient presents today for follow up visit for TBSE. Patient was last evaluated on 07/24/23 . Patient denies medication changes. Patient reports she does not have spots, moles and lesions of concern to be evaluated. Patient reports throughout her lifetime she has had complete sun exposure. Currently, patient reports if she has excessive sun exposure, she does apply sunscreen and/or wears protective coverings. Patient reports she has hx of bx. Patient reports  family history of skin cancers. The patient has spots, moles and lesions to be evaluated, some may be new or changing and the patient has concerns that these could be cancer.  The following portions of the chart were reviewed this encounter and updated as appropriate: medications, allergies, medical history  Review of Systems:  No other skin or systemic complaints except as noted in HPI or Assessment and Plan.  Objective  Well appearing patient in no apparent distress; mood and affect are within normal limits.  A full examination was performed including scalp, head, eyes, ears, nose, lips, neck, chest, axillae, abdomen, back, buttocks, bilateral upper extremities, bilateral lower extremities, hands, feet, fingers, toes, fingernails, and toenails. All findings within normal limits unless otherwise noted below.   Relevant physical exam findings are noted in the Assessment and Plan.    Assessment & Plan   LENTIGINES, SEBORRHEIC KERATOSES, HEMANGIOMAS - Benign normal skin lesions - Benign-appearing - Call for any changes  MELANOCYTIC NEVI - Tan-brown and/or pink-flesh-colored symmetric macules and papules - Benign appearing on exam today - Observation - Call clinic for new or changing moles - Recommend daily use of broad spectrum spf 30+ sunscreen to sun-exposed areas.    ACTINIC DAMAGE - Chronic condition, secondary to cumulative UV/sun exposure - diffuse scaly erythematous macules with underlying dyspigmentation - Recommend daily broad spectrum sunscreen SPF 30+ to sun-exposed areas, reapply every 2 hours as needed.  - Staying in the shade or wearing long sleeves, sun glasses (UVA+UVB protection) and wide brim hats (4-inch brim around the entire circumference of the hat) are also recommended for sun protection.  - Call for new or changing lesions.  HISTORY OF SQUAMOUS CELL CARCINOMA IN SITU OF THE SKIN- on anterior face treated with Mohs on 08/21/23 - No evidence of recurrence today -Well healed scar on right nasal sidewall  - Recommend regular full body skin exams - Recommend daily broad spectrum sunscreen SPF 30+ to sun-exposed areas, reapply every 2 hours as needed.  - Call if any new or changing lesions are noted between office visits   Angular Cheilitis Exam: mouth  - Assessment: Cracking at the corners of the mouth, consistent with angular cheilitis (perlche). Associated with pooling of saliva in the deepened creases of the mouth angles, leading to yeast irritation.   Treatment Plan:  - start using previously prescribe hydrocortisone  ointment for daily application to affected areas for 2 weeks   -After 2 weeks, discontinue hydrocortisone  and switch to nightly Aquaphor application as a barrier    SKIN CANCER SCREENING PERFORMED TODAY.     No follow-ups on file.  IBerwyn Lesches, Surg Tech III, am acting as scribe for [employer], DO.   Documentation: I have reviewed the above documentation for accuracy and completeness, and I agree with the above.  Delon Lenis, DO

## 2023-09-27 ENCOUNTER — Telehealth: Payer: Self-pay | Admitting: Nurse Practitioner

## 2023-09-27 ENCOUNTER — Other Ambulatory Visit: Admitting: *Deleted

## 2023-09-27 LAB — SURGICAL PATHOLOGY

## 2023-09-27 NOTE — Patient Instructions (Signed)
 Visit Information  Thank you for taking time to visit with me today. Please don't hesitate to contact me if I can be of assistance to you before our next scheduled telephone appointment.  Our next appointment is no further scheduled appointments.    Following is a copy of your care plan:   Goals Addressed             This Visit's Progress    COMPLETED: [facility] Transitions of Care (TOC) Care Plan       Problems: (Reviewed or updated progress 09/30/23) Recent Hospitalization for treatment of CHF and COPD 09/05/23- pt states she is no longer coughing up phlegm, pt is taking mucinex , continues on oxygen  at 2 liters 24/7,  09/13/23- pt states she saw primary care provider yesterday for tick bite (appeared red at the site), now on doxycycline , states  already looks better, pt states breathing so much better, no new concerns reported Spoke with pt who reports she has appointment for pulmonary on 11/19/23 but has not heard anything from nephrology appointment, RN CM will check on this, no other new concerns reported  Interventions: Heart Failure Interventions: Provided education on low sodium diet Discussed the importance of keeping all appointments with provider Reinforced Heart Failure action plan Reinforced importance of daily weights and recording, ask pt to obtain scales  COPD Interventions:(Reviewed or updated progress 09/30/23) Assessed social determinant of health barriers Discussed the importance of adequate rest and management of fatigue with COPD Use of home oxygen  Reinforced oxygen  safety Patient monitors SpO2 at home Reinforced signs /symptoms of infection Reinforced COPD action plan: on 2L/[place] continuous oxygen   Reviewed referrals placed- pulmonary pt has appointment with Dr. Darlean on 11/19/23 ,  nephrology referral completed on 6/9, unable to see where referral sent, spoke with Donna at [facility], she is unable to see where referral was sent but will check on this and call RN CM back,  per Charmaine the referral coordinator the referral has now been sent to Dr. Rachele in [place], they will review and call pt to schedule,  RN Care Manager gave pt Dr. Juanito contact information, if she does not hear from anyone within a week, she will call Reviewed plan of care including TOC case closure, offered longitudinal case management and pt declined  Patient Self Care Activities: (Reviewed or updated progress 09/30/23) Attend all scheduled provider appointments Call pharmacy for medication refills 3-7 days in advance of running out of medications Call provider office for new concerns or questions  Perform all self care activities independently  Perform IADL's (shopping, preparing meals, housekeeping, managing finances) independently Take medications as prescribed use salt in moderation eliminate smoking in my home eliminate symptom triggers at home eat healthy/prescribed diet: low sodium heart healthy Wear oxygen  and monitor saturations Alternate activity with rest Call your doctor for any signs of infection, fever Referral was sent to kidney specialist Dr. Rachele in [place] @ [phone], please call them if you do not hear something within a week TOC case closure  Plan:  Case closure                 The patient verbalized understanding of instructions, educational materials, and care plan provided today and DECLINED offer to receive copy of patient instructions, educational materials, and care plan.   The patient has been provided with contact information for the care management team and has been advised to call with any health related questions or concerns.  No further follow up required: case closure  Please call the care guide team at [phone] if you need to cancel or reschedule your appointment.   Please call the Suicide and Crisis Lifeline: 988 call the [place]  National Suicide Prevention Lifeline: (442)405-8921 or TTY: 320-829-6563 TTY 725-622-5934) to  talk to a trained counselor call 1-800-273-TALK (toll free, 24 hour hotline) go to [facility] [address], [place] [phone]) call the [facility] Crisis Line: [phone] call 911 if you are experiencing a Mental Health or Behavioral Health Crisis or need someone to talk to.  Mliss Creed [facility], BSN RN Care Manager/ Transition of Care [place]/ [facility] [phone]

## 2023-09-27 NOTE — Telephone Encounter (Signed)
 I have spoke with Glade Satterfield concerning this Patient and she is aware that Referral has been sent to Dr. Hildreth Office in [place].

## 2023-09-27 NOTE — Telephone Encounter (Unsigned)
 Copied from CRM [id number]. Topic: Referral - Status >> Sep 27, 2023  1:37 PM Donna BRAVO wrote: Reason for CRM: Mliss Creed [facility] Case manager [phone] asking for an update on patient referral to Nephrology, Mliss would like to speak with Alan

## 2023-09-27 NOTE — Transitions of Care (Post Inpatient/ED Visit) (Signed)
 Transition of Care week 6  Visit Note  09/27/2023  Name: [name] MRN: [id number]          DOB: September 18, 1975  Situation: Patient enrolled in [facility] 30-day program. Visit completed with patient by telephone.   Background:    Past Medical History:  Diagnosis Date   Anemia 08/19/2018   Asthma    Bipolar 1 disorder    Chronic low back pain 04/25/2017   Chronic neck pain 04/25/2017   Chronic obstructive pulmonary disease 11/28/2019   Chronic pain syndrome 03/05/2016   DDD (degenerative disc disease), cervical 05/27/2017   Dysuria 04/30/2020   Enlarged lymph node 05/30/2017   Seen on CTA- reactive vs sarcoidosis.   Essential hypertension 09/03/2015   Generalized anxiety disorder    H. 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 Surveillance colonoscopy in 2021 negative. Repeat next surveillance colonoscopy due in 5 years (2026).    Assessment: Patient Reported Symptoms: Cognitive Cognitive Status: Able to follow simple commands, Alert and oriented to person, place, and time, Normal speech and language skills      Neurological Neurological Review of Symptoms: No symptoms reported    HEENT HEENT Symptoms Reported: No symptoms reported      Cardiovascular Cardiovascular Symptoms Reported: No symptoms reported    Respiratory Respiratory Symptoms Reported: No  symptoms reported Respiratory Conditions: COPD Respiratory Self-Management Outcome: 4 (good) Respiratory Comment: oxygen  at 2 liters via [place] 24/7, continues monitoring 02 saturations  Endocrine Patient reports the following symptoms related to hypoglycemia or hyperglycemia : No symptoms reported    Gastrointestinal Gastrointestinal Symptoms Reported: No symptoms reported      Genitourinary Genitourinary Symptoms Reported: No symptoms reported    Integumentary Integumentary Symptoms Reported: No symptoms reported    Musculoskeletal Musculoskelatal Symptoms Reviewed: No symptoms reported        Psychosocial Psychosocial Symptoms Reported: No symptoms reported         There were no vitals filed for this visit.  Medications Reviewed Today     Reviewed by Aura Mliss LABOR, RN (Registered Nurse) on 09/27/23 at 1454  Med List Status: <None>   Medication Order Taking? Sig Documenting Provider Last Dose Status Informant  albuterol  (VENTOLIN  HFA) 108 (90 Base) MCG/ACT inhaler [id number]  Inhale 2 puffs into the lungs every 6 (six) hours as needed for wheezing or shortness of breath. Pokhrel, Laxman, MD  Active   ALPRAZolam  (XANAX ) 1 MG tablet [id number]  Take 1 mg by mouth 4 (four) times daily as needed. [provider]  Active Self, Pharmacy Records  baclofen  (LIORESAL ) 10 MG tablet [id number]  Take 10 mg by mouth 3 (three) times daily as needed for muscle spasms. [provider]  Active Self, Pharmacy Records  Calcium Polycarbophil (FIBER-CAPS PO) [id number]  Take 1 capsule by mouth daily. [provider]  Active Self, Pharmacy  Records  desoximetasone  (TOPICORT ) 0.25 % cream [id number]  Apply 1 Application topically 2 (two) times daily. Gladis Mary-Margaret, FNP  Active Self, Pharmacy Records  desvenlafaxine (PRISTIQ) 100 MG 24 hr tablet [id number]  Take 100 mg by mouth every morning. [provider]  Active Self, Pharmacy Records  fluconazole  (DIFLUCAN ) 150 MG  tablet [id number]  Take 1 tablet (150 mg total) by mouth every three (3) days as needed. Lavell Lye A, FNP  Active   FLUoxetine (PROZAC) 20 MG capsule [id number]  Take 20 mg by mouth daily. [provider]  Active Self, Pharmacy Records  fluPHENAZine (PROLIXIN) 2.5 MG tablet [id number]  Take 2.5 mg by mouth at bedtime. [provider]  Active Self, Pharmacy Records  guaiFENesin  (MUCINEX ) 600 MG 12 hr tablet [id number]  Take 1 tablet (600 mg total) by mouth 2 (two) times daily. Pokhrel, Laxman, MD  Active   mupirocin  ointment (BACTROBAN ) 2 % [id number]  Apply 1 Application topically 2 (two) times daily. Paci, Karina M, MD  Active   naloxone  (NARCAN ) nasal spray 4 mg/0.1 mL [id number] No Place 1 spray into the nose once. For opioid overdose [provider] Taking Active Self, Pharmacy Records           Med Note SYDELL, HERON NOVAK   Thu Sep 20, 2023  1:58 PM) PRN, has not needed.   nicotine  (NICODERM CQ  - DOSED IN MG/24 HOURS) 21 mg/24hr patch [id number]  Place 1 patch (21 mg total) onto the skin daily. Pokhrel, Laxman, MD  Active   nystatin  (MYCOSTATIN ) 100000 UNIT/ML suspension [id number]  Take 5 mLs (500,000 Units total) by mouth 4 (four) times daily. Hawks, Christy A, FNP  Active   OLANZapine (ZYPREXA) 10 MG tablet [id number]  Take 10 mg by mouth at bedtime. [provider]  Active Self, Pharmacy Records           Med Note ROZENA, WAYLON Repress Aug 19, 2023  3:13 PM) Patient reports she was told to stop this medication, [facility] has on autofill and continues to dispense.   omeprazole  (PRILOSEC) 20 MG capsule [id number]  Take 20 mg by mouth daily. [provider]  Active Self, Pharmacy Records  oxyCODONE -acetaminophen  (PERCOCET) 10-325 MG tablet [id number]  Take 1 tablet by mouth 4 (four) times daily as needed. [provider]  Active Self, Pharmacy Records  QUEtiapine  (SEROQUEL ) 400 MG tablet [id number]  Take 800 mg by mouth at bedtime. [provider]  Active Self, Pharmacy Records            Goals Addressed             This Visit's Progress    COMPLETED: VBCI Transitions of Care (TOC) Care Plan       Problems: (Reviewed or updated progress 09/30/23) Recent Hospitalization for treatment of CHF and COPD 09/05/23- pt states she is no longer coughing up phlegm, pt is taking mucinex , continues on oxygen  at 2 liters 24/7,  09/13/23- pt states she saw primary care provider yesterday for tick bite (appeared red at the site), now on doxycycline , states  already looks better, pt states breathing so much better, no new concerns reported Spoke with pt who reports she has appointment for pulmonary on 11/19/23 but has not heard anything from nephrology appointment, RN CM will check on this, no other new concerns reported  Interventions: Heart Failure Interventions: Provided education on low sodium diet Discussed the importance of keeping all appointments with provider Reinforced Heart Failure action plan Reinforced  importance of daily weights and recording, ask pt to obtain scales  COPD Interventions:(Reviewed or updated progress 09/30/23) Assessed social determinant of health barriers Discussed the importance of adequate rest and management of fatigue with COPD Use of home oxygen  Reinforced oxygen  safety Patient monitors SpO2 at home Reinforced signs /symptoms of infection Reinforced COPD action plan: on 2L/[place] continuous oxygen   Reviewed referrals placed- pulmonary pt has appointment with Dr. Darlean on 11/19/23 ,  nephrology referral completed on 6/9, unable to see where referral sent, spoke with Donna at [facility], she is unable to see where referral was sent but will check on this and call RN CM back, per Charmaine the referral coordinator the referral has now been sent to Dr. Rachele in [place], they will review and call pt to schedule,  RN Care Manager gave pt Dr. Juanito contact information, if she does not hear from anyone  within a week, she will call Reviewed plan of care including TOC case closure, offered longitudinal case management and pt declined  Patient Self Care Activities: (Reviewed or updated progress 09/30/23) Attend all scheduled provider appointments Call pharmacy for medication refills 3-7 days in advance of running out of medications Call provider office for new concerns or questions  Perform all self care activities independently  Perform IADL's (shopping, preparing meals, housekeeping, managing finances) independently Take medications as prescribed use salt in moderation eliminate smoking in my home eliminate symptom triggers at home eat healthy/prescribed diet: low sodium heart healthy Wear oxygen  and monitor saturations Alternate activity with rest Call your doctor for any signs of infection, fever Referral was sent to kidney specialist Dr. Rachele in [place] @ [phone], please call them if you do not hear something within a week TOC case closure  Plan:  Case closure                 Recommendation:   PCP Follow-up  Follow Up Plan:   Closing From:  Transitions of Care Program Patient has met all care management goals. Care Management case will be closed. Patient has been provided contact information should new needs arise.   Mliss Creed [facility], BSN RN Care Manager/ Transition of Care [place]/ [facility] [phone]

## 2023-09-28 ENCOUNTER — Ambulatory Visit: Payer: Self-pay | Admitting: Dermatology

## 2023-09-28 NOTE — Progress Notes (Signed)
 HI Shirron, This patient has no active mychart.  Please call patient with path results. Reassure results were benign and no additional treatment is required.  Thanks!

## 2023-10-01 NOTE — Telephone Encounter (Signed)
 Pt has been informed of results and expressed understanding.

## 2023-10-06 DIAGNOSIS — J449 Chronic obstructive pulmonary disease, unspecified: Secondary | ICD-10-CM | POA: Diagnosis not present

## 2023-10-22 ENCOUNTER — Ambulatory Visit

## 2023-10-22 VITALS — BP 113/72 | HR 97 | Ht 61.0 in | Wt 132.0 lb

## 2023-10-22 DIAGNOSIS — Z0000 Encounter for general adult medical examination without abnormal findings: Secondary | ICD-10-CM

## 2023-10-22 DIAGNOSIS — Z1231 Encounter for screening mammogram for malignant neoplasm of breast: Secondary | ICD-10-CM

## 2023-10-22 NOTE — Patient Instructions (Signed)
 [name] , Thank you for taking time out of your busy schedule to complete your Annual Wellness Visit with me. I enjoyed our conversation and look forward to speaking with you again next year. I, as well as your care team,  appreciate your ongoing commitment to your health goals. Please review the following plan we discussed and let me know if I can assist you in the future. Your Game plan/ To Do List   Follow up Visits: Next Medicare AWV with our clinical staff: 10/22/24 at 1:10p.m.    Next Office Visit with your provider: n/a  Clinician Recommendations:  Aim for 30 minutes of exercise or brisk walking, 6-8 glasses of water, and 5 servings of fruits and vegetables each day.       This is a list of the screening recommended for you and due dates:  Health Maintenance  Topic Date Due   DTaP/Tdap/Td vaccine (1 - Tdap) Never done   Pneumococcal Vaccination (1 of 2 - PCV) Never done   Hepatitis B Vaccine (1 of 3 - 19+ 3-dose series) Never done   Mammogram  05/02/2022   Pap with HPV screening  04/26/2023   Medicare Annual Wellness Visit  07/05/2023   COVID-19 Vaccine (1) 11/06/2024*   Flu Shot  11/02/2023   Colon Cancer Screening  05/16/2027   Hepatitis C Screening  Completed   HIV Screening  Completed   HPV Vaccine  Aged Out   Meningitis B Vaccine  Aged Out  *Topic was postponed. The date shown is not the original due date.    Advanced directives: (Declined) Advance directive discussed with you today. Even though you declined this today, please call our office should you change your mind, and we can give you the proper paperwork for you to fill out. Advance Care Planning is important because it:  [x]  Makes sure you receive the medical care that is consistent with your values, goals, and preferences  [x]  It provides guidance to your family and loved ones and reduces their decisional burden about whether or not they are making the right decisions based on your wishes.  Follow the link  provided in your after visit summary or read over the paperwork we have mailed to you to help you started getting your Advance Directives in place. If you need assistance in completing these, please reach out to us  so that we can help you!  See attachments for Preventive Care and Fall Prevention Tips.

## 2023-10-22 NOTE — Progress Notes (Signed)
 Subjective:   [name] is a 48 y.o. who presents for a Medicare Wellness preventive visit.  As a reminder, Annual Wellness Visits don't include a physical exam, and some assessments may be limited, especially if this visit is performed virtually. We may recommend an in-person follow-up visit with your provider if needed.  Visit Complete: Virtual I connected with  [name] on 10/22/23 by a audio enabled telemedicine application and verified that I am speaking with the correct person using two identifiers.  Patient Location: Home  Provider Location: Home Office  I discussed the limitations of evaluation and management by telemedicine. The patient expressed understanding and agreed to proceed.  Vital Signs: Because this visit was a virtual/telehealth visit, some criteria may be missing or patient reported. Any vitals not documented were not able to be obtained and vitals that have been documented are patient reported.  VideoDeclined- This patient declined Librarian, academic. Therefore the visit was completed with audio only.  Persons Participating in Visit: Patient.  AWV Questionnaire: No: Patient Medicare AWV questionnaire was not completed prior to this visit.  Cardiac Risk Factors include: advanced age (>70men, >14 women);hypertension;smoking/ tobacco exposure     Objective:    Today's Vitals   10/22/23 1322 10/22/23 1325  BP: 113/72   Pulse: 97   Weight: 132 lb (59.9 kg)   Height: 5' 1 (1.549 m)   PainSc:  6    Body mass index is 24.94 kg/m.     10/22/2023    1:29 PM 08/18/2023    4:30 PM 04/26/2023    2:44 PM 07/08/2022   11:42 AM 07/05/2022    2:39 PM 02/08/2021    9:36 AM 06/08/2020    7:43 AM  Advanced Directives  Does Patient Have a Medical Advance Directive? No No No No No No No  Would patient like information on creating a medical advance directive?  No - Patient declined No - Patient declined  No - Patient declined No -  Patient declined     Current Medications (verified) Outpatient Encounter Medications as of 10/22/2023  Medication Sig   albuterol  (VENTOLIN  HFA) 108 (90 Base) MCG/ACT inhaler Inhale 2 puffs into the lungs every 6 (six) hours as needed for wheezing or shortness of breath.   ALPRAZolam  (XANAX ) 1 MG tablet Take 1 mg by mouth 4 (four) times daily as needed.   baclofen  (LIORESAL ) 10 MG tablet Take 10 mg by mouth 3 (three) times daily as needed for muscle spasms.   Calcium Polycarbophil (FIBER-CAPS PO) Take 1 capsule by mouth daily.   desoximetasone  (TOPICORT ) 0.25 % cream Apply 1 Application topically 2 (two) times daily.   desvenlafaxine (PRISTIQ) 100 MG 24 hr tablet Take 100 mg by mouth every morning.   fluconazole  (DIFLUCAN ) 150 MG tablet Take 1 tablet (150 mg total) by mouth every three (3) days as needed.   FLUoxetine (PROZAC) 20 MG capsule Take 20 mg by mouth daily.   fluPHENAZine (PROLIXIN) 2.5 MG tablet Take 2.5 mg by mouth at bedtime.   guaiFENesin  (MUCINEX ) 600 MG 12 hr tablet Take 1 tablet (600 mg total) by mouth 2 (two) times daily.   mupirocin  ointment (BACTROBAN ) 2 % Apply 1 Application topically 2 (two) times daily.   naloxone  (NARCAN ) nasal spray 4 mg/0.1 mL Place 1 spray into the nose once. For opioid overdose   nicotine  (NICODERM CQ  - DOSED IN MG/24 HOURS) 21 mg/24hr patch Place 1 patch (21 mg total) onto the skin daily.  nystatin  (MYCOSTATIN ) 100000 UNIT/ML suspension Take 5 mLs (500,000 Units total) by mouth 4 (four) times daily.   OLANZapine (ZYPREXA) 10 MG tablet Take 10 mg by mouth at bedtime.   omeprazole  (PRILOSEC) 20 MG capsule Take 20 mg by mouth daily.   oxyCODONE -acetaminophen  (PERCOCET) 10-325 MG tablet Take 1 tablet by mouth 4 (four) times daily as needed.   QUEtiapine  (SEROQUEL ) 400 MG tablet Take 800 mg by mouth at bedtime.   No facility-administered encounter medications on file as of 10/22/2023.    Allergies (verified) Amoxicillin, Chantix [varenicline],  Codeine, Divalproex sodium, Lithium, Oxycodone -acetaminophen , Penicillins, Prednisone , Propoxyphene, Augmentin [amoxicillin-pot clavulanate], and Zolpidem   History: Past Medical History:  Diagnosis Date   Anemia 08/19/2018   Asthma    Bipolar 1 disorder    Chronic low back pain 04/25/2017   Chronic neck pain 04/25/2017   Chronic obstructive pulmonary disease 11/28/2019   Chronic pain syndrome 03/05/2016   DDD (degenerative disc disease), cervical 05/27/2017   Dysuria 04/30/2020   Enlarged lymph node 05/30/2017   Seen on CTA- reactive vs sarcoidosis.   Essential hypertension 09/03/2015   Generalized anxiety disorder    H. 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 Surveillance colonoscopy in 2021 negative. Repeat next surveillance colonoscopy due in 5 years (2026).   Past Surgical History:  Procedure Laterality Date   ABDOMINAL HYSTERECTOMY     BLADDER SURGERY  2015   CHOLECYSTECTOMY     Family History  Problem Relation Age of Onset   Renal Disease Mother    Heart attack Father    Thyroid  disease Neg Hx    Social History   Socioeconomic History   Marital status: Legally Separated    Spouse name: Not on file   Number of children: 3   Years of education: 11   Highest education level: 11th grade  Occupational History   Occupation: Disability  Tobacco Use    Smoking status: Every Day    Current packs/day: 1.00    Types: Cigarettes   Smokeless tobacco: Never  Vaping Use   Vaping status: Never Used  Substance and Sexual Activity   Alcohol use: No   Drug use: No   Sexual activity: Not Currently    Birth control/protection: Surgical  Other Topics Concern   Not on file  Social History Narrative   Right handed   Drinks caffeine    One story home   Social Drivers of Health   Financial Resource Strain: Low Risk  (10/22/2023)   Overall Financial Resource Strain (CARDIA)    Difficulty of Paying Living Expenses: Not hard at all  Food Insecurity: No Food Insecurity (10/22/2023)   Hunger Vital Sign    Worried About Running Out of Food in the Last Year: Never true    Ran Out of Food in the Last Year: Never true  Transportation Needs:  No Transportation Needs (10/22/2023)   PRAPARE - Administrator, Civil Service (Medical): No    Lack of Transportation (Non-Medical): No  Physical Activity: Insufficiently Active (10/22/2023)   Exercise Vital Sign    Days of Exercise per Week: 7 days    Minutes of Exercise per Session: 20 min  Stress: No Stress Concern Present (10/22/2023)   [employer] of Occupational Health - Occupational Stress Questionnaire    Feeling of Stress: Not at all  Social Connections: Moderately Isolated (10/22/2023)   Social Connection and Isolation Panel    Frequency of Communication with Friends and Family: More than three times a week    Frequency of Social Gatherings with Friends and Family: More than three times a week    Attends Religious Services: More than 4 times per year    Active Member of [employer] or Organizations: No    Attends Engineer, structural: Never    Marital Status: Divorced    Tobacco Counseling Ready to quit: No Counseling given: Yes    Clinical Intake:  Pre-visit preparation completed: Yes  Pain : 0-10 (upper back) Pain Score: 6  Pain Type: Chronic pain Pain Location:  Back Pain Orientation: Upper Pain Descriptors / Indicators: Aching, Constant Pain Onset: More than a month ago Pain Frequency: Constant Pain Relieving Factors: pains meds  Pain Relieving Factors: pains meds  BMI - recorded: 24.94 Nutritional Status: BMI of 19-24  Normal Nutritional Risks: None Diabetes: No  No results found for: HGBA1C   How often do you need to have someone help you when you read instructions, pamphlets, or other written materials from your doctor or pharmacy?: 1 - Never  Interpreter Needed?: No  Information entered by :: alia t/cma   Activities of Daily Living     10/22/2023    1:27 PM 08/18/2023    7:51 PM  In your present state of health, do you have any difficulty performing the following activities:  Hearing? 0 0  Vision? 0 0  Difficulty concentrating or making decisions? 0 0  Walking or climbing stairs? 0   Dressing or bathing? 0   Doing errands, shopping? 0 0  Preparing Food and eating ? N   Using the Toilet? N   In the past six months, have you accidently leaked urine? Y   Do you have problems with loss of bowel control? N   Managing your Medications? N   Managing your Finances? N   Housekeeping or managing your Housekeeping? N     Patient Care Team: Gladis Mustard, FNP as PCP - General (Family Medicine) [facility] , Roni Malinda Rogue, MD (Inactive) as Consulting Physician (Psychiatry)  I have updated your Care Teams any recent Medical Services you may have received from other providers in the past year.     Assessment:   This is a routine wellness examination for [facility].  Hearing/Vision screen Hearing Screening - Comments:: Pt denies hearing dif Vision Screening - Comments:: Pt wear glasses/pt goes [facility] [place].[facility] last ov 4mos ago   Goals Addressed             This Visit's Progress    Patient Stated       Pt loose some wt       Depression Screen     10/22/2023    1:30 PM  09/12/2023   11:33 AM 08/21/2023    3:25 PM 06/12/2023    2:53 PM 01/29/2023    2:12  PM 07/05/2022    2:34 PM 07/04/2022   11:40 AM  PHQ 2/9 Scores  PHQ - 2 Score 5 3 1 1 2 2 2   PHQ- 9 Score 14 10   6 4 4     Fall Risk     10/22/2023    1:25 PM 09/20/2023    2:22 PM 09/12/2023   11:34 AM 08/23/2023    3:18 PM 08/21/2023    3:20 PM  Fall Risk   Falls in the past year? 0 0 0 0 0  Comment  none reported today     Number falls in past yr: 0  0 0 0  Injury with Fall? 0  0  0  Risk for fall due to : No Fall Risks No Fall Risks No Fall Risks  No Fall Risks  Follow up Falls evaluation completed  Falls evaluation completed      MEDICARE RISK AT HOME:  Medicare Risk at Home Any stairs in or around the home?: Yes If so, are there any without handrails?: Yes Home free of loose throw rugs in walkways, pet beds, electrical cords, etc?: Yes Adequate lighting in your home to reduce risk of falls?: Yes Life alert?: No Use of a cane, walker or w/c?: No Grab bars in the bathroom?: No Shower chair or bench in shower?: No Elevated toilet seat or a handicapped toilet?: Yes  TIMED UP AND GO:  Was the test performed?  no  Cognitive Function: 6CIT completed        10/22/2023    1:29 PM 07/05/2022    2:39 PM 02/08/2021    9:31 AM 04/02/2019    8:57 AM  6CIT Screen  What Year? 0 points 0 points 0 points 0 points  What month? 0 points 0 points 0 points 0 points  What time? 0 points 0 points 0 points 0 points  Count back from 20 0 points 0 points 0 points 0 points  Months in reverse 4 points 0 points 0 points 0 points  Repeat phrase 8 points 0 points 2 points 0 points  Total Score 12 points 0 points 2 points 0 points    Immunizations  There is no immunization history on file for this patient.  Screening Tests Health Maintenance  Topic Date Due   DTaP/Tdap/Td (1 - Tdap) Never done   Pneumococcal Vaccine 75-9 Years old (1 of 2 - PCV) Never done   Hepatitis B Vaccines (1 of 3 - 19+ 3-dose  series) Never done   MAMMOGRAM  05/02/2022   Cervical Cancer Screening (HPV/Pap Cotest)  04/26/2023   COVID-19 Vaccine (1) 11/06/2024 (Originally 05/12/1980)   INFLUENZA VACCINE  11/02/2023   Medicare Annual Wellness (AWV)  10/21/2024   Colonoscopy  05/16/2027   Hepatitis C Screening  Completed   HIV Screening  Completed   HPV VACCINES  Aged Out   Meningococcal B Vaccine  Aged Out    Health Maintenance  Health Maintenance Due  Topic Date Due   DTaP/Tdap/Td (1 - Tdap) Never done   Pneumococcal Vaccine 78-69 Years old (1 of 2 - PCV) Never done   Hepatitis B Vaccines (1 of 3 - 19+ 3-dose series) Never done   MAMMOGRAM  05/02/2022   Cervical Cancer Screening (HPV/Pap Cotest)  04/26/2023   Health Maintenance Items Addressed: See Nurse Notes at the end of this note  Additional Screening:  Vision Screening: Recommended annual ophthalmology exams for early detection of glaucoma and other disorders of the eye. Would you  like a referral to an eye doctor? No    Dental Screening: Recommended annual dental exams for proper oral hygiene  Community Resource Referral / Chronic Care Management: CRR required this visit?  No   CCM required this visit?  No   Plan:    I have personally reviewed and noted the following in the patient's chart:   Medical and social history Use of alcohol, tobacco or illicit drugs  Current medications and supplements including opioid prescriptions. Patient is currently taking opioid prescriptions. Information provided to patient regarding non-opioid alternatives. Patient advised to discuss non-opioid treatment plan with their provider. Functional ability and status Nutritional status Physical activity Advanced directives List of other physicians Hospitalizations, surgeries, and ER visits in previous 12 months Vitals Screenings to include cognitive, depression, and falls Referrals and appointments  In addition, I have reviewed and discussed with patient  certain preventive protocols, quality metrics, and best practice recommendations. A written personalized care plan for preventive services as well as general preventive health recommendations were provided to patient.   Ozie Ned, CMA   10/22/2023   After Visit Summary: (Declined) Due to this being a telephonic visit, with patients personalized plan was offered to patient but patient Declined AVS at this time   Notes: Pt is aware and encouraged to have the following done at her next pcp visit: Cervical Cancer Screening, DTAP, Hep B, Pneumonia and mammogram-ordered.

## 2023-10-26 NOTE — Progress Notes (Signed)
 NEUROLOGY CONSULTATION NOTE  MCKAYLA MULCAHEY MRN: [id number] DOB: [birth year]  Referring provider: Lauraine Hamlet, NP Primary care provider: Marjie Lunger, FNP  Reason for consult:  frequent falls.  Assessment/Plan:   Frequent falls.  Unclear etiology.  Does not appear to be related to an intracranial etiology or myelopathy.  Neurologic exam today is normal.  No vestibulopathy noted.  Possibly related to prior episodes of hypoxia or pharmacologic effect for chronic pain.     Subjective:  [name] is a 48 year old right-handed female with COPD, HTN, IBS and Bipolar 1 disorder who presents for headaches.  History supplemented by her son, ED and referring provider's notes.  Lumbar X-ray personally reviewed.  She reports having had falls beginning a year and a half ago.  No dizziness or lower extremity pain.  Her legs would just give out.  She has chronic pain syndrome including left C7-T1 radiculopathy and chronic low back pain.  She has been treated by pain management at [facility].  She reports that she had an MRI of the cervical spine that did not reveal any spinal stenosis or myelopathy.  Notes mention that she has a negative MRI of the lumbar spine but reports not available.  The only imaging of the lumbar spine available is an X-ray from 04/01/2014 which was negative.  She hasn't had falls in a long time.    PAST MEDICAL HISTORY: Past Medical History:  Diagnosis Date   Anemia 08/19/2018   Asthma    Bipolar 1 disorder    Chronic low back pain 04/25/2017   Chronic neck pain 04/25/2017   Chronic obstructive pulmonary disease 11/28/2019   Chronic pain syndrome 03/05/2016   DDD (degenerative disc disease), cervical 05/27/2017   Dysuria 04/30/2020   Enlarged lymph node 05/30/2017   Seen on CTA- reactive vs sarcoidosis.   Essential hypertension 09/03/2015   Generalized anxiety disorder    H. 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 Surveillance colonoscopy in 2021 negative. Repeat next surveillance colonoscopy due in 5 years (2026).    PAST SURGICAL HISTORY: Past Surgical History:  Procedure Laterality Date   ABDOMINAL HYSTERECTOMY     BLADDER SURGERY  2015   CHOLECYSTECTOMY      MEDICATIONS: Current Outpatient Medications on File Prior to Visit  Medication Sig Dispense Refill   ALPRAZolam  (XANAX ) 1 MG tablet Take 1 mg by mouth 4 (four) times daily as needed.     baclofen  (LIORESAL ) 10 MG tablet Take 10 mg by mouth 3 (three) times daily as needed for muscle spasms.     desoximetasone  (TOPICORT ) 0.25 % cream Apply 1 Application topically 2 (two) times daily. 30 g 0   fluconazole  (DIFLUCAN ) 150 MG tablet Take 1 tablet (150 mg total) by mouth every three (3) days as needed. 3 tablet 0   fluPHENAZine (PROLIXIN)  2.5 MG tablet Take 2.5 mg by mouth at bedtime.     guaiFENesin  (MUCINEX ) 600 MG 12 hr tablet Take 1 tablet (600 mg total) by mouth 2 (two) times daily. (Patient taking differently: Take 600 mg by mouth as needed.) 30 tablet 0   naloxone  (NARCAN ) nasal spray 4 mg/0.1 mL Place 1 spray into the nose once. For opioid overdose (Patient taking differently: Place 1 spray into the nose as needed. For opioid overdose)     oxyCODONE -acetaminophen  (PERCOCET) 10-325 MG tablet Take 1 tablet by mouth 4 (four) times daily as needed.      QUEtiapine  (SEROQUEL ) 400 MG tablet Take 800 mg by mouth at bedtime.     albuterol  (VENTOLIN  HFA) 108 (90 Base) MCG/ACT inhaler Inhale 2 puffs into the lungs every 6 (six) hours as needed for wheezing or shortness of breath. 8 g 2   mupirocin  ointment (BACTROBAN ) 2 % Apply 1 Application topically 2 (two) times daily. 22 g 2   nystatin  (MYCOSTATIN ) 100000 UNIT/ML suspension Take 5 mLs (500,000 Units total) by mouth 4 (four) times daily. 473 mL 0   No current facility-administered medications on file prior to visit.     ALLERGIES: Allergies  Allergen Reactions   Amoxicillin Nausea And Vomiting   Chantix [Varenicline] Other (See Comments)    Causes bad nightmares, hallucinations   Codeine Other (See Comments)    Shaking    Divalproex Sodium Other (See Comments)    sedation   Lithium Other (See Comments)    Drowsiness, tremors drowsiness   Oxycodone -Acetaminophen  Other (See Comments)    Shaking, jitters   Penicillins Other (See Comments)    Reaction type/severity unknown   Prednisone  Other (See Comments)    Shaking, jitters, altered mood   Propoxyphene Nausea And Vomiting   Augmentin [Amoxicillin-Pot Clavulanate] Diarrhea and Other (See Comments)   Zolpidem Other (See Comments)    Patient states it does not agree with her body.     FAMILY HISTORY: Family History  Problem Relation Age of Onset   Renal Disease Mother    Heart attack Father    Thyroid  disease Neg Hx     Objective:  Blood pressure 124/74, pulse 80, height 5' 1 (1.549 m), weight 141 lb (64 kg), SpO2 (!) 81%. General: No acute distress.  Patient appears well-groomed.   Head:  Normocephalic/atraumatic Eyes:  fundi examined but not visualized Neck: supple, no paraspinal tenderness, full range of motion Heart: regular rate and rhythm Neurological Exam: Mental status: alert and oriented to person, place, and time, speech fluent and not dysarthric, language intact. Cranial nerves: CN I: not tested CN II: pupils  equal, round and reactive to light, visual fields intact CN III, IV, VI:  full range of motion, no nystagmus, no ptosis CN V: facial sensation intact. CN VII: upper and lower face symmetric CN VIII: hearing intact CN IX, X: gag intact, uvula midline CN XI: sternocleidomastoid and trapezius muscles intact CN XII: tongue midline Bulk & Tone: normal, no fasciculations. Motor:  muscle strength 5/5 throughout Sensation:  Pinprick and vibratory sensation intact. Deep Tendon Reflexes:  2+ throughout,  toes downgoing.   Finger to nose testing:  Without dysmetria.   Heel to shin:  Without dysmetria.   Gait:  Normal station and stride.  able to turn and walk in tandem.  Romberg negative.    Thank you for allowing me to take part in the care of this patient.  Juliene Dunnings, DO  CC: Mary-Margaret Gladis, FNP

## 2023-10-29 ENCOUNTER — Encounter: Payer: Self-pay | Admitting: Neurology

## 2023-10-29 ENCOUNTER — Ambulatory Visit (INDEPENDENT_AMBULATORY_CARE_PROVIDER_SITE_OTHER): Admitting: Neurology

## 2023-10-29 VITALS — BP 124/74 | HR 80 | Ht 61.0 in | Wt 141.0 lb

## 2023-10-29 DIAGNOSIS — R296 Repeated falls: Secondary | ICD-10-CM | POA: Diagnosis not present

## 2023-10-29 NOTE — Patient Instructions (Signed)
 Neurologic exam looks okay.  The low oxygen  levels may have played a role with all of your symptoms described today.

## 2023-10-31 DIAGNOSIS — I3139 Other pericardial effusion (noninflammatory): Secondary | ICD-10-CM | POA: Diagnosis not present

## 2023-11-06 DIAGNOSIS — J449 Chronic obstructive pulmonary disease, unspecified: Secondary | ICD-10-CM | POA: Diagnosis not present

## 2023-11-08 DIAGNOSIS — M5412 Radiculopathy, cervical region: Secondary | ICD-10-CM | POA: Diagnosis not present

## 2023-11-08 DIAGNOSIS — Z5181 Encounter for therapeutic drug level monitoring: Secondary | ICD-10-CM | POA: Diagnosis not present

## 2023-11-08 DIAGNOSIS — M5030 Other cervical disc degeneration, unspecified cervical region: Secondary | ICD-10-CM | POA: Diagnosis not present

## 2023-11-08 DIAGNOSIS — M5416 Radiculopathy, lumbar region: Secondary | ICD-10-CM | POA: Diagnosis not present

## 2023-11-18 NOTE — Progress Notes (Unsigned)
 [name], female    DOB: December 30, 1975    MRN: [id number]   Brief patient profile:  48 yowf  quit smoking  08/2023  referred to pulmonary clinic in [place]  11/19/2023 by Ronal Mckusick  for 08/18/23 for copd eval already on 02 per PCP x 3y intially hs and on last admit rec 24/7 02 but has never been on maint inhalers as apparently downplays doe.   Pt not previously seen by PCCM service.     Admit date: 08/18/2023 Discharge date: 08/20/2023   Recommendations for Outpatient Follow-Up:    Follow up with your primary care provider in one week.  Check CBC, BMP, magnesium  in the next visit Please consider nephrology and cardiology evaluation as outpatient.  Patient did have mild to moderate pericardial effusion and has elevated creatinine. Patient should be encouraged to quit smoking.  Nicotine  patch has been prescribed.     Discharge Diagnosis:    Principal Problem:   Acute respiratory failure with hypoxia (HCC)   Chronic pain syndrome   Essential hypertension   Major depressive disorder   Chronic obstructive pulmonary disease   Generalized anxiety disorder   Tobacco dependence         History of Present Illness:    [name] is a 48 y.o. female with past medical history significant of COPD, essential hypertension, asthma, bipolar 1 disorder, generalized anxiety disorder, irritable bowel syndrome, continued tobacco abuse, chronic respiratory failure  on home O2 at 2 L/min presented to med [facility] with complaint of fatigue and shortness of breath for 2 days preceding with fever, cough and rhinorrhea.  Patient denied any dizziness but had heaviness in her head.  In the ED patient was noted to have acute on chronic hypoxic respiratory failure and was admitted hospital for COPD exacerbation.        Hospital Course:    Following conditions were addressed during hospitalization as listed below,   Acute on chronic respiratory failure with hypoxia: Secondary to  COPD exacerbation.  Patient received IV steroids nebulizers antibiotics during hospitalization.  2D echocardiogram showed more than 70% LV ejection fraction with concentric LVH and small to moderate pericardial effusion without any evidence of tamponade.  Received empiric with doxycycline  will be continued on discharge.  Chest x-ray without any infiltrate.  Currently patient is on 2 L of oxygen  by nasal cannula which is at her baseline.  Congestive heart failure ruled out.   COPD with acute exacerbation, during hospitalization patient received oxygen  nebulizers and steroids.  Continues to smoke.  Counseled about it.  Continue nicotine  patch.  Patient is unable to tolerate prednisone .  Does not have active wheezing.  Will continue with oxygen , albuterol  inhaler and doxycycline  on discharge.   tobacco abuse: Nicotine  patch.  Will be continued on discharge.  Counseling done.   essential hypertension: Not on any antihypertensives at home.  Will continue to monitor.   chronic pain syndrome: Continue Percocet from home.   major depressive disorder: Continue mirtazapine , Seroquel  Prozac and Pristiq.   Elevated creatinine levels.  Creatinine was 0.7 last year but was 1.5, 3 months back.  Creatinine of 2.2 today.  Received IV Lasix  x 1.  Advised to follow-up with PCP as outpatient in consider nephrology follow-up.  Check BMP in the next visit.   generalized anxiety disorder, on Xanax  at home.     History of Present Illness  11/19/2023  Pulmonary/ 1st office eval/ [facility] / [facility] on prn saba  Chief  Complaint  Patient presents with   Establish Care    COPD and desats - referred by hospital  Dyspnea:  [employer] and does just as well with it as without Cough: minimal cough not productive and more more common day than night  Sleep: bed is flat/ 2 pillows  SABA use: not  using  02: 2lpm pulsed  portable / conc 2lpm     No obvious day to day or daytime pattern/variability or assoc excess/  purulent sputum or mucus plugs or hemoptysis or cp or chest tightness, subjective wheeze or overt sinus or hb symptoms.    Also denies any obvious fluctuation of symptoms with weather or environmental changes or other aggravating or alleviating factors except as outlined above   No unusual exposure hx or h/o childhood pna/ asthma or knowledge of premature birth.  Current Allergies, Complete Past Medical History, Past Surgical History, Family History, and Social History were reviewed in [employer] record.  ROS  The following are not active complaints unless bolded Hoarseness, sore throat, dysphagia, dental problems, itching, sneezing,  nasal congestion or discharge of excess mucus or purulent secretions, ear ache,   fever, chills, sweats, unintended wt loss or wt gain, classically pleuritic or exertional cp,  orthopnea pnd or arm/hand swelling  or leg swelling, presyncope, palpitations, abdominal pain, anorexia, nausea, vomiting, diarrhea  or change in bowel habits or change in bladder habits, change in stools or change in urine, dysuria, hematuria,  rash, arthralgias, visual complaints, headache, numbness, weakness or ataxia or problems with walking or coordination,  change in mood or  memory.            Outpatient Medications Prior to Visit  Medication Sig Dispense Refill   albuterol  (VENTOLIN  HFA) 108 (90 Base) MCG/ACT inhaler Inhale 2 puffs into the lungs every 6 (six) hours as needed for wheezing or shortness of breath. 8 g 2   ALPRAZolam  (XANAX ) 1 MG tablet Take 1 mg by mouth 4 (four) times daily as needed.     baclofen  (LIORESAL ) 10 MG tablet Take 10 mg by mouth 3 (three) times daily as needed for muscle spasms.     fluPHENAZine (PROLIXIN) 2.5 MG tablet Take 2.5 mg by mouth at bedtime.     naloxone  (NARCAN ) nasal spray 4 mg/0.1 mL Place 1 spray into the nose once. For opioid overdose     oxyCODONE -acetaminophen  (PERCOCET) 10-325 MG tablet Take 1 tablet by mouth 4  (four) times daily as needed.     QUEtiapine  (SEROQUEL ) 400 MG tablet Take 800 mg by mouth at bedtime.     desoximetasone  (TOPICORT ) 0.25 % cream Apply 1 Application topically 2 (two) times daily. 30 g 0   fluconazole  (DIFLUCAN ) 150 MG tablet Take 1 tablet (150 mg total) by mouth every three (3) days as needed. 3 tablet 0   guaiFENesin  (MUCINEX ) 600 MG 12 hr tablet Take 1 tablet (600 mg total) by mouth 2 (two) times daily. (Patient taking differently: Take 600 mg by mouth as needed.) 30 tablet 0   mupirocin  ointment (BACTROBAN ) 2 % Apply 1 Application topically 2 (two) times daily. 22 g 2   nystatin  (MYCOSTATIN ) 100000 UNIT/ML suspension Take 5 mLs (500,000 Units total) by mouth 4 (four) times daily. 473 mL 0   No facility-administered medications prior to visit.    Past Medical History:  Diagnosis Date   Anemia 08/19/2018   Asthma    Bipolar 1 disorder    Chronic low back pain 04/25/2017  Chronic neck pain 04/25/2017   Chronic obstructive pulmonary disease 11/28/2019   Chronic pain syndrome 03/05/2016   DDD (degenerative disc disease), cervical 05/27/2017   Dysuria 04/30/2020   Enlarged lymph node 05/30/2017   Seen on CTA- reactive vs sarcoidosis.   Essential hypertension 09/03/2015   Generalized anxiety disorder    H. 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  Surveillance colonoscopy in 2021 negative. Repeat next surveillance colonoscopy due in 5 years (2026).      Objective:     BP 114/77   Pulse 76   Ht 5' 1 (1.549 m)   Wt 140 lb 6.4 oz (63.7 kg)   SpO2 94% Comment: 2 L POC  BMI 26.53 kg/m   SpO2: 94 % (2 L POC) somber wf nad   HEENT : Oropharynx  clear   Nasal turbinates nl    NECK :  without  apparent JVD/ palpable Nodes/TM    LUNGS: no acc muscle use,  Min barrel  contour chest wall with bilateral  slightly decreased bs s audible wheeze and  without cough on insp or exp maneuvers and min  Hyperresonant  to  percussion bilaterally    CV:  RRR  no s3 or murmur or increase in P2, and no edema   ABD:  soft and nontender with pos end  insp Hoover's  in the supine position.  No bruits or organomegaly appreciated   MS:  Nl gait/ ext warm without deformities Or obvious joint restrictions  calf tenderness, cyanosis or clubbing     SKIN: warm and dry without lesions    NEURO:  alert, approp, nl sensorium with  no motor or cerebellar deficits apparent.        I personally reviewed images and agree with radiology impression as follows:  CXR:   pa and lateral 11/09/19  No active dz  My review: mild hyperinflation      Assessment    Assessment & Plan Asthmatic bronchitis , chronic (HCC) Quit smoking 08/2023 - Alpha one AT phenotype 11/19/2023 >>> - 11/19/2023  After extensive coaching inhaler device,  effectiveness =    60% hfa > breztri  2bid and prn saba   Unusual disconnect between hypoxemia and doe noted - see chronic resp failure  Likely pt is  Group D (now reclassified as E) in terms of symptom/risk and laba/lama/ICS  therefore appropriate rx at this point >>>  breztri  appropriate and return for pfts      Chronic respiratory failure with hypoxia (HCC) Started on 24 h 02 at admit - HC03  30 on 08/20/23  - 11/19/2023   Walked on 2lpmPOC   x  2  lap(s) =  approx 300  ft  @ mod pace, stopped due to desats to 89% so increased  to 3lpm POC and finished last lab =150 ft  with lowest 02 sats 98% s doe  Minimization of doe historically may be related to pych issues or meds but means she needs to pay more attention to detail re monitoring sats (see AVS below)   Reviewed goal of > 90% at all times for heart and cns function.   Each maintenance medication was reviewed in detail including emphasizing most importantly the difference between maintenance and prns and under what circumstances the prns are to be triggered using an action plan format where appropriate.  Total time for H and P, chart review, counseling, reviewing hfa/02/pulse ox  device(s) , directly observing portions of ambulatory 02 saturation study/ and generating customized AVS unique to this office visit / same day charting = 48 min new pt eval                    Patient Instructions  Make sure you check your oxygen  saturation  AT  your highest level of activity (not after you stop)   to be sure it stays over 90% and adjust  02 flow upward to maintain this level if needed but remember to turn it back to previous settings when you stop (to conserve your supply).   Plan A = Automatic = Always=    Breztri  Take 2 puffs first thing in am and then another 2 puffs about 12 hours later.   Work on inhaler technique:  relax and gently blow all the way out then take a nice smooth full deep breath back in, triggering the inhaler at same time you start breathing in.  Hold breath in for at least  5 seconds if you can. Blow out breztri   thru nose. Rinse and gargle with water when done.  If mouth or throat bother you at all,  try brushing teeth/gums/tongue with arm and hammer toothpaste/ make a slurry and gargle and spit out.       Plan B = Backup (to supplement plan A, not to replace it) Use your albuterol  inhaler as a rescue medication to be used if you can't catch your breath by resting or slowing your pace  or doing a relaxed purse lip breathing pattern.  - The less  you use it, the better it will work when you need it. - Ok to use the inhaler up to 2 puffs  every 4 hours if you must but call for appointment if use goes up over your usual need - Don't leave home without it !!  (think of it like the spare tire or starter fluid for your car)     Please remember to go to the lab department   for your tests - we will call you with the results when they are available.      Please schedule a follow up office visit in 6 weeks, call sooner if needed with pfts on return         Ozell America, MD 11/20/2023

## 2023-11-19 ENCOUNTER — Telehealth: Payer: Self-pay | Admitting: Internal Medicine

## 2023-11-19 ENCOUNTER — Encounter: Payer: Self-pay | Admitting: Internal Medicine

## 2023-11-19 ENCOUNTER — Ambulatory Visit (INDEPENDENT_AMBULATORY_CARE_PROVIDER_SITE_OTHER): Admitting: Internal Medicine

## 2023-11-19 VITALS — BP 114/77 | HR 76 | Ht 61.0 in | Wt 140.4 lb

## 2023-11-19 DIAGNOSIS — Z87891 Personal history of nicotine dependence: Secondary | ICD-10-CM | POA: Diagnosis not present

## 2023-11-19 DIAGNOSIS — J9611 Chronic respiratory failure with hypoxia: Secondary | ICD-10-CM | POA: Diagnosis not present

## 2023-11-19 DIAGNOSIS — J4489 Other specified chronic obstructive pulmonary disease: Secondary | ICD-10-CM | POA: Diagnosis not present

## 2023-11-19 DIAGNOSIS — J449 Chronic obstructive pulmonary disease, unspecified: Secondary | ICD-10-CM | POA: Insufficient documentation

## 2023-11-19 MED ORDER — BREZTRI AEROSPHERE 160-9-4.8 MCG/ACT IN AERO: INHALATION_SPRAY | RESPIRATORY_TRACT | 11 refills | Status: DC

## 2023-11-19 MED ORDER — BREZTRI AEROSPHERE 160-9-4.8 MCG/ACT IN AERO: 2.0000 | INHALATION_SPRAY | Freq: Two times a day (BID) | RESPIRATORY_TRACT | Status: AC

## 2023-11-19 NOTE — Patient Instructions (Addendum)
 Make sure you check your oxygen  saturation  AT  your highest level of activity (not after you stop)   to be sure it stays over 90% and adjust  02 flow upward to maintain this level if needed but remember to turn it back to previous settings when you stop (to conserve your supply).   Plan A = Automatic = Always=    Breztri  Take 2 puffs first thing in am and then another 2 puffs about 12 hours later.   Work on inhaler technique:  relax and gently blow all the way out then take a nice smooth full deep breath back in, triggering the inhaler at same time you start breathing in.  Hold breath in for at least  5 seconds if you can. Blow out breztri   thru nose. Rinse and gargle with water when done.  If mouth or throat bother you at all,  try brushing teeth/gums/tongue with arm and hammer toothpaste/ make a slurry and gargle and spit out.       Plan B = Backup (to supplement plan A, not to replace it) Use your albuterol  inhaler as a rescue medication to be used if you can't catch your breath by resting or slowing your pace  or doing a relaxed purse lip breathing pattern.  - The less you use it, the better it will work when you need it. - Ok to use the inhaler up to 2 puffs  every 4 hours if you must but call for appointment if use goes up over your usual need - Don't leave home without it !!  (think of it like the spare tire or starter fluid for your car)     Please remember to go to the lab department   for your tests - we will call you with the results when they are available.      Please schedule a follow up office visit in 6 weeks, call sooner if needed with pfts on return

## 2023-11-19 NOTE — Telephone Encounter (Signed)
 Spoke with patient regarding the Tuesday 01/15/24 2:00pm PFT appointment at [facility] time is 1:45 pm--1st floor registration desk for check in---follow up with Dr. Darlean at 3:45 pm.  Will mail information to patient and she voiced her understanding

## 2023-11-20 ENCOUNTER — Encounter: Payer: Self-pay | Admitting: Internal Medicine

## 2023-11-20 DIAGNOSIS — J9611 Chronic respiratory failure with hypoxia: Secondary | ICD-10-CM | POA: Insufficient documentation

## 2023-11-20 NOTE — Assessment & Plan Note (Addendum)
 Started on 24 h 02 at admit - HC03  30 on 08/20/23  - 11/19/2023   Walked on 2lpmPOC   x  2  lap(s) =  approx 300  ft  @ mod pace, stopped due to desats to 89% so increased to 3lpm POC and finished last lab =150 ft  with lowest 02 sats 98% s doe  Minimization of doe historically may be related to pych issues or meds but means she needs to pay more attention to detail re monitoring sats (see AVS below)   Reviewed goal of > 90% at all times for heart and cns function.   Each maintenance medication was reviewed in detail including emphasizing most importantly the difference between maintenance and prns and under what circumstances the prns are to be triggered using an action plan format where appropriate.  Total time for H and P, chart review, counseling, reviewing hfa/02/pulse ox  device(s) , directly observing portions of ambulatory 02 saturation study/ and generating customized AVS unique to this office visit / same day charting = 48 min new pt eval

## 2023-11-20 NOTE — Assessment & Plan Note (Addendum)
 Quit smoking 08/2023 - Alpha one AT phenotype 11/19/2023 >>> - 11/19/2023  After extensive coaching inhaler device,  effectiveness =    60% hfa > breztri  2bid and prn saba   Unusual disconnect between hypoxemia and doe noted - see chronic resp failure  Likely pt is  Group D (now reclassified as E) in terms of symptom/risk and laba/lama/ICS  therefore appropriate rx at this point >>>  breztri  appropriate and return for pfts

## 2023-11-22 LAB — ALPHA-1-ANTITRYPSIN PHENOTYP: A-1 Antitrypsin: 166 mg/dL (ref 101–187)

## 2023-11-24 DIAGNOSIS — R7989 Other specified abnormal findings of blood chemistry: Secondary | ICD-10-CM | POA: Diagnosis not present

## 2023-11-24 DIAGNOSIS — I3139 Other pericardial effusion (noninflammatory): Secondary | ICD-10-CM | POA: Diagnosis not present

## 2023-11-24 DIAGNOSIS — N1832 Chronic kidney disease, stage 3b: Secondary | ICD-10-CM | POA: Diagnosis not present

## 2023-11-24 DIAGNOSIS — I129 Hypertensive chronic kidney disease with stage 1 through stage 4 chronic kidney disease, or unspecified chronic kidney disease: Secondary | ICD-10-CM | POA: Diagnosis not present

## 2023-11-25 ENCOUNTER — Ambulatory Visit: Payer: Self-pay | Admitting: Internal Medicine

## 2023-11-26 ENCOUNTER — Other Ambulatory Visit (HOSPITAL_COMMUNITY): Payer: Self-pay | Admitting: Nephrology

## 2023-11-26 DIAGNOSIS — D649 Anemia, unspecified: Secondary | ICD-10-CM

## 2023-11-26 DIAGNOSIS — N1832 Chronic kidney disease, stage 3b: Secondary | ICD-10-CM

## 2023-11-26 NOTE — Progress Notes (Signed)
Spoke with pt and notified of results per Dr. Wert. Pt verbalized understanding and denied any questions. 

## 2023-11-26 NOTE — Telephone Encounter (Signed)
-----   Message from Ozell America sent at 11/25/2023  3:04 PM EDT ----- Call patient :  Study is unremarkable, no change in recs  ----- Message ----- From: Rebecka Memos Lab Results In Sent: 11/22/2023   5:37 AM EDT To: Ozell KATHEE America, MD

## 2023-12-04 DIAGNOSIS — I10 Essential (primary) hypertension: Secondary | ICD-10-CM | POA: Diagnosis not present

## 2023-12-05 ENCOUNTER — Ambulatory Visit (HOSPITAL_COMMUNITY)
Admission: RE | Admit: 2023-12-05 | Discharge: 2023-12-05 | Disposition: A | Discharge status: Home / Self Care | Source: Ambulatory Visit | Attending: Nephrology | Admitting: Nephrology

## 2023-12-05 DIAGNOSIS — D649 Anemia, unspecified: Secondary | ICD-10-CM | POA: Insufficient documentation

## 2023-12-05 DIAGNOSIS — N189 Chronic kidney disease, unspecified: Secondary | ICD-10-CM | POA: Diagnosis not present

## 2023-12-05 DIAGNOSIS — N1832 Chronic kidney disease, stage 3b: Secondary | ICD-10-CM | POA: Diagnosis not present

## 2023-12-05 DIAGNOSIS — R93421 Abnormal radiologic findings on diagnostic imaging of right kidney: Secondary | ICD-10-CM | POA: Diagnosis not present

## 2023-12-05 DIAGNOSIS — N200 Calculus of kidney: Secondary | ICD-10-CM | POA: Diagnosis not present

## 2023-12-10 ENCOUNTER — Ambulatory Visit
Admission: RE | Admit: 2023-12-10 | Discharge: 2023-12-10 | Disposition: A | Discharge status: Home / Self Care | Source: Ambulatory Visit | Attending: Nurse Practitioner | Admitting: Nurse Practitioner

## 2023-12-10 DIAGNOSIS — Z0000 Encounter for general adult medical examination without abnormal findings: Secondary | ICD-10-CM

## 2023-12-10 DIAGNOSIS — Z1231 Encounter for screening mammogram for malignant neoplasm of breast: Secondary | ICD-10-CM | POA: Diagnosis not present

## 2023-12-14 ENCOUNTER — Encounter: Payer: Self-pay | Admitting: Nurse Practitioner

## 2023-12-14 ENCOUNTER — Ambulatory Visit (INDEPENDENT_AMBULATORY_CARE_PROVIDER_SITE_OTHER): Admitting: Nurse Practitioner

## 2023-12-14 VITALS — BP 120/74 | HR 91 | Temp 97.6°F | Ht 61.0 in | Wt 141.0 lb

## 2023-12-14 DIAGNOSIS — R635 Abnormal weight gain: Secondary | ICD-10-CM

## 2023-12-14 MED ORDER — PHENTERMINE HCL 37.5 MG PO TABS: 37.5000 mg | ORAL_TABLET | Freq: Every day | ORAL | 2 refills | Status: DC

## 2023-12-14 NOTE — Progress Notes (Signed)
 Subjective:    Patient ID: [name], female    DOB: [birth year], 48 y.o.   MRN: [id number]   Chief Complaint: Weight Gain   HPI  Patient in c/o weight gain.  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   Abnormal finding of diagnostic imaging 12/22/2020   Angular cheilitis with candidiasis 08/24/2020   Thyroid  disorder 07/15/2020   Weight gain 07/15/2020   Subacute frontal sinusitis 07/13/2020   Generalized anxiety disorder    Asthma    Migraine headaches    Knee pain, bilateral 07/01/2020   Trochanteric bursitis of right hip 07/01/2020   Dysuria 04/30/2020   Presence of auditory hallucinations 12/14/2019   Chronic obstructive pulmonary disease 11/28/2019   Primary insomnia 11/28/2019   Major depressive disorder 11/13/2018   Anemia 08/19/2018   Hypokalemia 08/19/2018   Enlarged lymph node 05/30/2017   DDD (degenerative disc disease), cervical 05/27/2017   Long-term current use of opiate analgesic 04/25/2017   Chronic low back pain 04/25/2017   Chronic neck pain 04/25/2017   Nocturnal hypoxia 08/10/2016   Tubular adenoma of colon 03/26/2016   Chronic pain syndrome 03/05/2016   Essential hypertension 09/03/2015   Irritable bowel syndrome without diarrhea 10/15/2014   Tobacco dependence 01/27/2013   Hot flashes not due to menopause 10/14/2012   Pelvic and perineal pain 06/17/2012   Bipolar 1 disorder 05/15/2012   Rectal prolapse 05/15/2012       Review of Systems  Constitutional:  Negative for  diaphoresis.  Eyes:  Negative for pain.  Respiratory:  Negative for shortness of breath.   Cardiovascular:  Negative for chest pain, palpitations and leg swelling.  Gastrointestinal:  Negative for abdominal pain.  Endocrine: Negative for polydipsia.  Skin:  Negative for rash.  Neurological:  Negative for dizziness, weakness and headaches.  Hematological:  Does not bruise/bleed easily.  All other systems reviewed and are negative.      Objective:   Physical Exam Constitutional:      Appearance: Normal appearance.  Cardiovascular:     Rate and Rhythm: Normal rate and regular rhythm.     Heart sounds: Normal heart sounds.  Pulmonary:     Effort: Pulmonary effort is normal.     Breath sounds: Normal breath sounds.  Skin:    General: Skin is warm.  Neurological:     General: No focal deficit present.     Mental Status: She is alert and oriented to person, place, and time.  Psychiatric:        Mood and Affect: Mood normal.        Behavior: Behavior normal.     BP 120/74   Pulse 91   Temp 97.6 F (36.4 C) (Temporal)   Ht 5' 1 (1.549 m)   Wt 141 lb (64 kg)   SpO2 97%   BMI 26.64 kg/m        Assessment & Plan:   [name] in today with chief complaint of Weight Gain   1.  Weight gain (Primary) Low fat  diet Exercise RTOprn - phentermine  (ADIPEX-P ) 37.5 MG tablet; Take 1 tablet (37.5 mg total) by mouth daily before breakfast.  Dispense: 30 tablet; Refill: 2    The above assessment and management plan was discussed with the patient. The patient verbalized understanding of and has agreed to the management plan. Patient is aware to call the clinic if symptoms persist or worsen. Patient is aware when to return to the clinic for a follow-up visit. Patient educated on when it is appropriate to go to the emergency department.   Mary-Margaret Gladis, FNP

## 2023-12-14 NOTE — Patient Instructions (Signed)
 Preventing Unhealthy Weight Gain, Adult  Staying at a healthy weight is important to your overall health. When fat builds up in your body, you may become overweight or obese. Being overweight or obese increases your risk of developing various health problems.  Unhealthy weight gain is often the result of making unhealthy food choices or not getting enough exercise. You can make changes to your lifestyle to prevent obesity and stay as healthy as possible.  How can unhealthy weight gain affect me?  Being overweight or obese can cause you to develop joint or bone problems, which can make it hard for you to stay active or do activities you enjoy. Being overweight also puts stress on your heart and lungs and can lead to health problems such as:  Diabetes.  Heart disease.  Some types of cancer.  Stroke.  Eating healthy, staying active, and having healthy habits can help to prevent unhealthy weight gain and lower your risk for health problems. These lifestyle changes will also help you manage stress and emotions, improve your self-esteem, and connect with friends and family.  What can increase my risk?  In addition to certain eating and lifestyle choices, some other factors that may make you more likely to have unhealthy weight gain include:  Having a family history of obesity.  Living in an area with limited access to:  [place], recreation centers, or sidewalks.  Healthy food choices, such as grocery stores and farmers' markets.  What actions can I take to prevent unhealthy weight gain?  Nutrition    Eat only as much as your body needs. To do this:  Pay attention to signs that you are hungry or full. Stop eating as soon as you feel full.  If you feel hungry, try drinking water first before eating. Drink enough water so your urine is pale yellow.  Eat smaller portions. Pay attention to portion sizes when eating out.  Look at serving sizes on food labels. Most foods contain more than one serving per container.  Eat the  recommended number of calories for your gender and activity level. For most active people, a daily total of 2,000 calories is appropriate. If you are trying to lose weight or are not very active, you may need to eat fewer calories. Talk with your health care provider or a dietitian about how many calories you need each day.  Choose healthy foods, such as:  Fruits and vegetables. At each meal, try to fill at least half of your plate with fruits and vegetables.  Whole grains, such as whole-wheat bread, brown rice, and quinoa.  Lean meats, such as chicken or fish.  Other healthy proteins, such as beans, eggs, or tofu.  Healthy fats, such as nuts, seeds, fatty fish, and olive oil.  Low-fat or fat-free dairy products.  Check food labels, and avoid food and drinks that:  Are high in calories.  Have added sugar.  Are high in sodium.  Have saturated fats or trans fats.  Cook foods in healthier ways, such as by baking, broiling, or grilling.  Make a meal plan for the week, and shop with a grocery list to help you stay on track with your purchases. Try to avoid going to the grocery store when you are hungry.  When grocery shopping, try to shop around the outside of the store first, where the fresh foods are. Doing this helps you avoid prepackaged foods, which can be high in sugar, salt (sodium), and fat.  Lifestyle    Exercise  for 30 or more minutes on 5 or more days each week. Exercising may include brisk walking, yard work, biking, running, swimming, and team sports like basketball and soccer. Ask your health care provider which exercises are safe for you.  Do activities that strengthen the muscles, such as lifting weights or using resistance bands, on 2 or more days a week.  Do not use any products that contain nicotine or tobacco. These products include cigarettes, chewing tobacco, and vaping devices, such as e-cigarettes. If you need help quitting, ask your health care provider.  If you drink alcohol:  Limit how much you  have to:  0-1 drink a day for women who are not pregnant.  0-2 drinks a day for men.  Know how much alcohol is in a drink. In the [place] one drink equals one 12 oz bottle of beer (355 mL), one 5 oz glass of wine (148 mL), or one 1 oz glass of hard liquor (44 mL).  Try to get 7-9 hours of sleep each night.  Other changes  Keep a food and activity journal to keep track of:  What you ate and how many calories you had. Remember to count the calories in sauces, dressings, and side dishes.  Whether you were active, and what exercises you did.  Your calorie, weight, and activity goals.  Check your weight regularly. Track any changes. If you notice that you have gained weight, make changes to your diet or activity routine.  Avoid taking weight-loss medicines or supplements. Talk to your health care provider before starting any new medicine or supplement.  Talk to your health care provider before trying any new diet or exercise plan.  Where to find more information  Talk with your health care provider or a dietitian about healthy eating and healthy lifestyle choices. You may also find information from:  [place]. Department of Agriculture, MyPlate: https://ball-collins.biz/  American Heart Association: www.heart.org  Centers for Disease Control and Prevention: FootballExhibition.com.br  Summary  Eating healthy, staying active, and having healthy habits can help to prevent unhealthy weight gain and lower your risk for health problems such as heart disease, diabetes, some types of cancer, and stroke.  Being overweight or obese can cause you to develop joint or bone problems, which can make it hard for you to stay active or do activities you enjoy.  You can prevent unhealthy weight gain by eating a healthy diet, exercising regularly, not smoking, limiting alcohol, and getting enough sleep.  Talk with your health care provider or a dietitian for guidance about healthy eating and healthy lifestyle choices.  This information is not intended to replace  advice given to you by your health care provider. Make sure you discuss any questions you have with your health care provider.  Document Revised: 10/15/2020 Document Reviewed: 10/15/2020  Elsevier Patient Education  2024 [employer].

## 2024-01-06 DIAGNOSIS — J449 Chronic obstructive pulmonary disease, unspecified: Secondary | ICD-10-CM | POA: Diagnosis not present

## 2024-01-15 ENCOUNTER — Encounter: Payer: Self-pay | Admitting: Internal Medicine

## 2024-01-15 ENCOUNTER — Ambulatory Visit (HOSPITAL_COMMUNITY)
Admission: RE | Admit: 2024-01-15 | Discharge: 2024-01-15 | Disposition: A | Discharge status: Home / Self Care | Source: Ambulatory Visit | Attending: Internal Medicine | Admitting: Internal Medicine

## 2024-01-15 ENCOUNTER — Ambulatory Visit (INDEPENDENT_AMBULATORY_CARE_PROVIDER_SITE_OTHER): Admitting: Internal Medicine

## 2024-01-15 VITALS — BP 101/66 | HR 102 | Ht 61.0 in | Wt 147.0 lb

## 2024-01-15 DIAGNOSIS — Z87891 Personal history of nicotine dependence: Secondary | ICD-10-CM

## 2024-01-15 DIAGNOSIS — J9611 Chronic respiratory failure with hypoxia: Secondary | ICD-10-CM

## 2024-01-15 DIAGNOSIS — J449 Chronic obstructive pulmonary disease, unspecified: Secondary | ICD-10-CM

## 2024-01-15 DIAGNOSIS — J4489 Other specified chronic obstructive pulmonary disease: Secondary | ICD-10-CM | POA: Diagnosis not present

## 2024-01-15 DIAGNOSIS — F1120 Opioid dependence, uncomplicated: Secondary | ICD-10-CM

## 2024-01-15 DIAGNOSIS — F315 Bipolar disorder, current episode depressed, severe, with psychotic features: Secondary | ICD-10-CM | POA: Diagnosis not present

## 2024-01-15 LAB — PULMONARY FUNCTION TEST
DL/VA % pred: 53 %
DL/VA: 2.35 ml/min/mmHg/L
DLCO unc % pred: 47 %
DLCO unc: 8.98 ml/min/mmHg
FEF 25-75 Post: 1.31 L/s
FEF 25-75 Pre: 0.8 L/s
FEF2575-%Change-Post: 63 %
FEF2575-%Pred-Post: 49 %
FEF2575-%Pred-Pre: 30 %
FEV1-%Change-Post: 14 %
FEV1-%Pred-Post: 66 %
FEV1-%Pred-Pre: 57 %
FEV1-Post: 1.71 L
FEV1-Pre: 1.49 L
FEV1FVC-%Change-Post: 3 %
FEV1FVC-%Pred-Pre: 80 %
FEV6-%Change-Post: 8 %
FEV6-%Pred-Post: 78 %
FEV6-%Pred-Pre: 72 %
FEV6-Post: 2.46 L
FEV6-Pre: 2.28 L
FEV6FVC-%Change-Post: -2 %
FEV6FVC-%Pred-Post: 98 %
FEV6FVC-%Pred-Pre: 101 %
FVC-%Change-Post: 11 %
FVC-%Pred-Post: 79 %
FVC-%Pred-Pre: 71 %
FVC-Post: 2.56 L
FVC-Pre: 2.3 L
Post FEV1/FVC ratio: 67 %
Post FEV6/FVC ratio: 96 %
Pre FEV1/FVC ratio: 65 %
Pre FEV6/FVC Ratio: 99 %
RV % pred: 151 %
RV: 2.41 L
TLC % pred: 106 %
TLC: 4.9 L

## 2024-01-15 MED ORDER — ALBUTEROL SULFATE (2.5 MG/3ML) 0.083% IN NEBU
2.5000 mg | INHALATION_SOLUTION | Freq: Once | RESPIRATORY_TRACT | Status: AC
  Administered 2024-01-15: 2.5 mg via RESPIRATORY_TRACT

## 2024-01-15 NOTE — Assessment & Plan Note (Addendum)
 Started on 24 h 02 at admit - HC03  30 on 08/20/23  - 11/19/2023   Walked on 2lpmPOC   x  2  lap(s) =  approx 300  ft  @ mod pace, stopped due to desats to 89% so increased to 3lpm POC and finished last lab =150 ft  with lowest 02 sats 98%   s doe  Continues to walk  s 02 so rec 2lpm hs and use the 02 prn during the day if sob or sats < 87%   F/u q 34m, sooner if needed

## 2024-01-15 NOTE — Assessment & Plan Note (Addendum)
 Low-dose CT lung cancer screening is recommended for patients who are 52-48 years of age with a 20+ pack-year history of smoking and who are currently smoking or quit <=15 years ago. No coughing up blood  No unintentional weight loss of > 15 pounds in the last 6 months - pt is eligible for scanning yearly until age 8 > referred 01/15/2024

## 2024-01-15 NOTE — Progress Notes (Signed)
 [name], female    DOB: 12-26-1975    MRN: [id number]   Brief patient profile:  48 yowf  quit smoking  08/2023/MM    referred to pulmonary clinic in [place]  11/19/2023 by Ronal Mckusick  for 08/18/23 for copd eval already on 02 per PCP x 3y intially hs and on last admit rec 24/7 02 but has never been on maint inhalers as apparently downplays doe.   Pt not previously seen by PCCM service.     Admit date: 08/18/2023 Discharge date: 08/20/2023   Recommendations for Outpatient Follow-Up:    Follow up with your primary care provider in one week.  Check CBC, BMP, magnesium  in the next visit Please consider nephrology and cardiology evaluation as outpatient.  Patient did have mild to moderate pericardial effusion and has elevated creatinine. Patient should be encouraged to quit smoking.  Nicotine  patch has been prescribed.     Discharge Diagnosis:    Principal Problem:   Acute respiratory failure with hypoxia (HCC)   Chronic pain syndrome   Essential hypertension   Major depressive disorder   Chronic obstructive pulmonary disease   Generalized anxiety disorder   Tobacco dependence         History of Present Illness:    [name] is a 48 y.o. female with past medical history significant of COPD, essential hypertension, asthma, bipolar 1 disorder, generalized anxiety disorder, irritable bowel syndrome, continued tobacco abuse, chronic respiratory failure  on home O2 at 2 L/min presented to med [facility] with complaint of fatigue and shortness of breath for 2 days preceding with fever, cough and rhinorrhea.  Patient denied any dizziness but had heaviness in her head.  In the ED patient was noted to have acute on chronic hypoxic respiratory failure and was admitted hospital for COPD exacerbation.        Hospital Course:    Following conditions were addressed during hospitalization as listed below,   Acute on chronic respiratory failure with  hypoxia: Secondary to COPD exacerbation.  Patient received IV steroids nebulizers antibiotics during hospitalization.  2D echocardiogram showed more than 70% LV ejection fraction with concentric LVH and small to moderate pericardial effusion without any evidence of tamponade.  Received empiric with doxycycline  will be continued on discharge.  Chest x-ray without any infiltrate.  Currently patient is on 2 L of oxygen  by nasal cannula which is at her baseline.  Congestive heart failure ruled out.   COPD with acute exacerbation, during hospitalization patient received oxygen  nebulizers and steroids.  Continues to smoke.  Counseled about it.  Continue nicotine  patch.  Patient is unable to tolerate prednisone .  Does not have active wheezing.  Will continue with oxygen , albuterol  inhaler and doxycycline  on discharge.   tobacco abuse: Nicotine  patch.  Will be continued on discharge.  Counseling done.   essential hypertension: Not on any antihypertensives at home.  Will continue to monitor.   chronic pain syndrome: Continue Percocet from home.   major depressive disorder: Continue mirtazapine , Seroquel  Prozac and Pristiq.   Elevated creatinine levels.  Creatinine was 0.7 last year but was 1.5, 3 months back.  Creatinine of 2.2 today.  Received IV Lasix  x 1.  Advised to follow-up with PCP as outpatient in consider nephrology follow-up.  Check BMP in the next visit.   generalized anxiety disorder, on Xanax  at home.     History of Present Illness  11/19/2023  Pulmonary/ 1st office eval/ [facility] / [place] [facility] on prn saba  Chief Complaint  Patient presents with   Establish Care    COPD and desats - referred by hospital  Dyspnea:  [employer] and does just as well with it as without Cough: minimal cough not productive and more more common day than night  Sleep: bed is flat/ 2 pillows  SABA use: not  using  02: 2lpm pulsed  portable / conc 2lpm  Rec Make sure you check your oxygen  saturation  AT  your  highest level of activity (not after you stop)   to be sure it stays over 90% Plan A = Automatic = Always=    Breztri  Take 2 puffs first thing in am and then another 2 puffs about 12 hours later.  Work on [employer] B = Backup (to supplement plan A, not to replace it) Use your albuterol  inhaler as a rescue medication Please schedule a follow up office visit in 6 weeks, call sooner if needed with pfts on return Alpha one AT phenotype 11/19/2023  MM level 166    01/15/2024  f/u ov/[place] office/Kawanna Christley re: GOLD 2 COPD  maint on no rx/ not even using 02 as rec   Chief Complaint  Patient presents with   Asthma    Pft   Dyspnea:  walking at [employer] and [place] 30 min mostly s oxygen   Cough: minimal  Sleeping: sleeping in recliner x one month x 30 degrees  SABA use: none  02: 2lpm   Lung cancer screening: referred    No obvious day to day or daytime variability or assoc excess/ purulent sputum or mucus plugs or hemoptysis or cp or chest tightness, subjective wheeze or overt sinus or hb symptoms.    Also denies any obvious fluctuation of symptoms with weather or environmental changes or other aggravating or alleviating factors except as outlined above   No unusual exposure hx or h/o childhood pna/ asthma or knowledge of premature birth.  Current Allergies, Complete Past Medical History, Past Surgical History, Family History, and Social History were reviewed in [employer] record.  ROS  The following are not active complaints unless bolded Hoarseness, sore throat, dysphagia, dental problems, itching, sneezing,  nasal congestion or discharge of excess mucus or purulent secretions, ear ache,   fever, chills, sweats, unintended wt loss or wt gain, classically pleuritic or exertional cp,  orthopnea pnd or arm/hand swelling  or leg swelling, presyncope, palpitations, abdominal pain, anorexia, nausea, vomiting, diarrhea  or change in bowel habits or  change in bladder habits, change in stools or change in urine, dysuria, hematuria,  rash, arthralgias, visual complaints, headache, numbness, weakness or ataxia or problems with walking or coordination,  change in mood or  memory.        Current Meds  Medication Sig   albuterol  (VENTOLIN  HFA) 108 (90 Base) MCG/ACT inhaler Inhale 2 puffs into the lungs every 6 (six) hours as needed for wheezing or shortness of breath.   ALPRAZolam  (XANAX ) 1 MG tablet Take 1 mg by mouth 4 (four) times daily as needed.   baclofen  (LIORESAL ) 10 MG tablet Take 10 mg by mouth 3 (three) times daily as needed for muscle spasms.   fluPHENAZine (PROLIXIN) 2.5 MG tablet Take 2.5 mg by mouth at bedtime.   naloxone  (NARCAN ) nasal spray 4 mg/0.1 mL Place 1 spray into the nose once. For opioid overdose   oxyCODONE -acetaminophen  (PERCOCET) 10-325 MG tablet Take 1 tablet by mouth 4 (four) times daily as needed.  phentermine  (ADIPEX-P ) 37.5 MG tablet Take 1 tablet (37.5 mg total) by mouth daily before breakfast.   QUEtiapine  (SEROQUEL ) 400 MG tablet Take 800 mg by mouth at bedtime.            Past Medical History:  Diagnosis Date   Anemia 08/19/2018   Asthma    Bipolar 1 disorder    Chronic low back pain 04/25/2017   Chronic neck pain 04/25/2017   Chronic obstructive pulmonary disease 11/28/2019   Chronic pain syndrome 03/05/2016   DDD (degenerative disc disease), cervical 05/27/2017   Dysuria 04/30/2020   Enlarged lymph node 05/30/2017   Seen on CTA- reactive vs sarcoidosis.   Essential hypertension 09/03/2015   Generalized anxiety disorder    H. 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 Surveillance colonoscopy in 2021 negative. Repeat next surveillance colonoscopy due in 5 years (2026).      Objective:    Wt Readings from Last 3 Encounters:  01/15/24 147 lb (66.7 kg)  12/14/23 141 lb (64 kg)  11/19/23 140 lb 6.4 oz (63.7 kg)      Vital signs reviewed  01/15/2024  - Note at rest 02 sats  90% on 2lpm POC   General appearance:    amb wf easily confused with details of care       HEENT : Oropharynx  clear   Nasal turbinates nl    NECK :  without  apparent JVD/ palpable Nodes/TM    LUNGS: no acc muscle use,  Min barrel  contour chest wall with bilateral  slightly decreased bs s audible wheeze and  without cough on insp or exp maneuvers and min  Hyperresonant  to  percussion bilaterally    CV:  RRR  no s3 or murmur or increase in P2, and no edema   ABD:  soft and nontender    MS:  Nl gait/ ext warm without deformities Or obvious joint restrictions  calf tenderness, cyanosis or clubbing     SKIN: warm and dry without lesions    NEURO:  alert, approp, nl sensorium with  no motor or cerebellar deficits apparent.           Assessment    Assessment & Plan COPD mixed type (HCC) Quit smoking 08/2023 with GOLD 2 criteria 01/15/2024 but DLC0 only 47%  - Alpha one AT phenotype 11/19/2023  MM level 166  - 11/19/2023  After extensive coaching inhaler device,  effectiveness =    60% hfa > breztri  2bid and prn saba  - PFT's  01/15/2024   FEV1 1.71 (66 % ) ratio 0.67  p 14 % improvement from saba p 0 prior to study with DLCO  9 (47%)   and FV curve min concavity  Pt is Group B in terms of symptom/risk and laba/lama therefore appropriate rx at this point >>>  stiolto, anoro  or bevespi all approp but make her she too much so for now just use saba prn  Re SABA :  I spent extra time with pt today reviewing appropriate use of albuterol  for prn use on exertion with the following points: 1) saba is for relief of sob that does not improve by walking a slower pace or resting but rather if the pt does not improve after trying this first. 2) If the pt is convinced, as many are, that saba helps recover from activity faster then it's easy to tell if this is the case by re-challenging : ie stop, take the inhaler, then p 5 minutes try the exact same activity (intensity of workload) that just caused the symptoms and see if they are substantially diminished or not after saba 3) if there is an activity that reproducibly causes the symptoms, try the saba 15 min before the activity on alternate days   If in fact the saba really does help, then fine to continue to use it prn but advised may need to look closer at the maintenance regimen  (for now =0) being used to achieve better control of airways disease with exertion.     Chronic respiratory failure with hypoxia (HCC) Started on 24 h 02 at admit - HC03  30 on 08/20/23  - 11/19/2023   Walked on 2lpmPOC   x  2  lap(s) =  approx 300  ft  @ mod pace, stopped due to desats to 89% so increased to 3lpm POC and finished last lab =150 ft  with lowest 02 sats 98%   s doe  Continues to walk  s 02 so rec 2lpm hs and use the 02 prn during the day if sob or sats < 87%   F/u q 89m, sooner if needed    Former cigarette smoker Low-dose CT lung cancer screening is recommended for patients who are 65-56 years of age with a 20+ pack-year history of smoking and who are currently smoking or quit <=15 years ago. No coughing up blood  No unintentional weight loss of > 15 pounds in the last 6 months - pt is eligible for scanning yearly until age 44 > referred 01/15/2024            Each maintenance medication was reviewed in detail including  emphasizing most importantly the difference between maintenance and prns and under what circumstances the prns are to be triggered using an action plan format where appropriate.  Total time for H and P, chart review, counseling, reviewing hfa/ 02 / pulse ox device(s) and generating customized AVS unique to this office visit / same day charting = 35 min summary f/u ov        AVS  Patient Instructions  Make sure you check your oxygen  saturation  AT  your highest level of activity (not after you stop)   to be sure it stays over 88% and adjust  02 flow upward to maintain this level if needed but remember to turn it back to previous settings when you stop (to conserve your supply).    Also  Ok to try albuterol  15 min before an activity (on alternating days)  that you know would usually make you short of breath and see if it makes any difference and if makes none then don't take albuterol  after activity unless you can't catch your breath as this means it's the resting that helps, not the albuterol .      My office  will be contacting you by phone for referral to lung cancer screening   [phone]) - if you don't hear back from my office within one week,  please call us  back or notify us  thru MyChart and we'll address it right away.     .  Please schedule a follow up visit in 6 months but call sooner if needed      Ozell America, MD 01/15/2024

## 2024-01-15 NOTE — Patient Instructions (Addendum)
 Make sure you check your oxygen  saturation  AT  your highest level of activity (not after you stop)   to be sure it stays over 88% and adjust  02 flow upward to maintain this level if needed but remember to turn it back to previous settings when you stop (to conserve your supply).    Also  Ok to try albuterol  15 min before an activity (on alternating days)  that you know would usually make you short of breath and see if it makes any difference and if makes none then don't take albuterol  after activity unless you can't catch your breath as this means it's the resting that helps, not the albuterol .      My office will be contacting you by phone for referral to lung cancer screening   [phone]) - if you don't hear back from my office within one week,  please call us  back or notify us  thru MyChart and we'll address it right away.     .  Please schedule a follow up visit in 6 months but call sooner if needed

## 2024-01-15 NOTE — Assessment & Plan Note (Addendum)
 Quit smoking 08/2023 with GOLD 2 criteria 01/15/2024 but DLC0 only 47%  - Alpha one AT phenotype 11/19/2023  MM level 166  - 11/19/2023  After extensive coaching inhaler device,  effectiveness =    60% hfa > breztri  2bid and prn saba  - PFT's  01/15/2024   FEV1 1.71 (66 % ) ratio 0.67  p 14 % improvement from saba p 0 prior to study with DLCO  9 (47%)   and FV curve min concavity  Pt is Group B in terms of symptom/risk and laba/lama therefore appropriate rx at this point >>>  stiolto, anoro or bevespi all approp but make her she too much so for now just use saba prn  Re SABA :  I spent extra time with pt today reviewing appropriate use of albuterol  for prn use on exertion with the following points: 1) saba is for relief of sob that does not improve by walking a slower pace or resting but rather if the pt does not improve after trying this first. 2) If the pt is convinced, as many are, that saba helps recover from activity faster then it's easy to tell if this is the case by re-challenging : ie stop, take the inhaler, then p 5 minutes try the exact same activity (intensity of workload) that just caused the symptoms and see if they are substantially diminished or not after saba 3) if there is an activity that reproducibly causes the symptoms, try the saba 15 min before the activity on alternate days   If in fact the saba really does help, then fine to continue to use it prn but advised may need to look closer at the maintenance regimen  (for now =0) being used to achieve better control of airways disease with exertion.

## 2024-01-24 ENCOUNTER — Telehealth: Payer: Self-pay | Admitting: Family Medicine

## 2024-01-24 NOTE — Telephone Encounter (Unsigned)
 Copied from CRM [id number]. Topic: Clinical - Request for Lab/Test Order >> Jan 24, 2024  4:38 PM [name] wrote: Reason for CRM: Patient is calling in to see if her lab work request from her other doctor has been received via fax to be done at the office. Please advise the patient.

## 2024-01-25 NOTE — Telephone Encounter (Signed)
 Called and spoke with patient and advised that we have not received a fax order for labs. Gave patient fax number for nurses station and she stated that she would contact [place] and see if they would fax to that number instead. Will be on the lookout for order

## 2024-02-04 ENCOUNTER — Other Ambulatory Visit

## 2024-02-08 ENCOUNTER — Telehealth: Payer: Self-pay

## 2024-02-08 NOTE — Telephone Encounter (Signed)
 Copied from CRM [id number]. Topic: Clinical - Medication Question >> Feb 06, 2024 12:42 PM [name] wrote: Reason for CRM: Pt called stated she thinks Dr Darlean wanted to have Lung Screening and something else but she is not sure what else he stated but she has not been contacted about either.  I only see LCS you wanted her to do - she has not received a call yet   Please advise

## 2024-02-11 NOTE — Telephone Encounter (Signed)
 Spoke with patient and advised that she will not qualify for lung cancer screening until she is age 48. I advised her I would forward this message to Dr Darlean and he will advise if he wants to order anything further. Pt verbalized understanding.

## 2024-02-13 NOTE — Telephone Encounter (Signed)
 Informed pt of dr leisa note

## 2024-02-15 ENCOUNTER — Encounter (HOSPITAL_COMMUNITY): Payer: Self-pay | Admitting: Nephrology

## 2024-02-18 ENCOUNTER — Other Ambulatory Visit (HOSPITAL_COMMUNITY): Payer: Self-pay | Admitting: Nephrology

## 2024-02-18 DIAGNOSIS — D7589 Other specified diseases of blood and blood-forming organs: Secondary | ICD-10-CM

## 2024-02-18 DIAGNOSIS — Z87448 Personal history of other diseases of urinary system: Secondary | ICD-10-CM

## 2024-02-18 DIAGNOSIS — R188 Other ascites: Secondary | ICD-10-CM

## 2024-03-03 ENCOUNTER — Ambulatory Visit (HOSPITAL_COMMUNITY)
Admission: RE | Admit: 2024-03-03 | Discharge: 2024-03-03 | Disposition: A | Discharge status: Home / Self Care | Source: Ambulatory Visit | Attending: Nephrology | Admitting: Nephrology

## 2024-03-03 DIAGNOSIS — R188 Other ascites: Secondary | ICD-10-CM | POA: Insufficient documentation

## 2024-03-03 DIAGNOSIS — D7589 Other specified diseases of blood and blood-forming organs: Secondary | ICD-10-CM | POA: Diagnosis present

## 2024-03-03 DIAGNOSIS — Z87448 Personal history of other diseases of urinary system: Secondary | ICD-10-CM | POA: Diagnosis present

## 2024-03-18 ENCOUNTER — Encounter: Payer: Self-pay | Admitting: Dermatology

## 2024-03-18 ENCOUNTER — Ambulatory Visit: Admitting: Dermatology

## 2024-03-18 VITALS — BP 113/76 | HR 85 | Temp 98.1°F

## 2024-03-18 DIAGNOSIS — C4492 Squamous cell carcinoma of skin, unspecified: Secondary | ICD-10-CM

## 2024-03-18 DIAGNOSIS — K130 Diseases of lips: Secondary | ICD-10-CM

## 2024-03-18 DIAGNOSIS — L905 Scar conditions and fibrosis of skin: Secondary | ICD-10-CM

## 2024-03-18 NOTE — Progress Notes (Signed)
° °  Follow Up Visit   Subjective  JARVIS KNODEL is a 48 y.o. female who presents for the following: follow up from Mohs surgery   The patient presents for follow up from Mohs surgery for a SCC on the head-anterior face, treated on 08/21/23, repaired with 2nd intention.  The patient has been bandaging the wound as directed. The endorse the following concerns: When she wears glasses it leaves and indent and changes colors   The following portions of the chart were reviewed this encounter and updated as appropriate: medications, allergies, medical history  Review of Systems:  No other skin or systemic complaints except as noted in HPI or Assessment and Plan.  Objective  Well appearing patient in no apparent distress; mood and affect are within normal limits.  A focal examination was performed including face  All findings within normal limits unless otherwise noted below.  Healing wound with mild erythema  Relevant physical exam findings are noted in the Assessment and Plan.      Assessment & Plan   Scar s/p Mohs for a SCC on the head-anterior face/nasal sidewall, treated on 08/21/23, repaired with 2nd intention.  - Discussed that scars take up to 12 months to mature from the date of surgery - Recommend SPF 30+ to scar daily to prevent purple color from UV exposure during scar maturation process - Discussed that erythema and raised appearance of scar will fade over the next 4-6 months - OK to start scar massage at 4-6 weeks post-op - Can consider silicone based products for scar healing starting at 6 weeks post-op  HISTORY OF SQUAMOUS CELL CARCINOMA OF THE SKIN - No evidence of recurrence today - Recommend regular full body skin exams - Recommend daily broad spectrum sunscreen SPF 30+ to sun-exposed areas, reapply every 2 hours as needed.  - Call if any new or changing lesions are noted between office visits  Angular Cheilitis- Poorly Controlled Patient counseled regarding  angular cheilitis, a condition characterized by erythema, fissuring, and irritation at the oral commissures, commonly associated with moisture accumulation, saliva pooling, or secondary fungal or bacterial infection. Advised on local care measures such as keeping affected areas clean and dry, applying a barrier ointment, and using prescribed topical antifungal or antibacterial therapy as indicated. Discussed expected course with improvement over 1-2 weeks and instructed patient to return for reevaluation if symptoms persist, worsen, or show signs of secondary infection. - Discussed following up with [facility] - OK to reuse hydrocortisone  for a couple of weeks but will need long term management with anti yeast with [facility]    Return for needs to see brenda ASAP for Angular Cheilitis.  I, Doyce Pan, CMA, am acting as scribe for RUFUS CHRISTELLA HOLY, MD.   Documentation: I have reviewed the above documentation for accuracy and completeness, and I agree with the above.  RUFUS CHRISTELLA HOLY, MD

## 2024-03-18 NOTE — Patient Instructions (Addendum)

## 2024-03-20 ENCOUNTER — Other Ambulatory Visit (HOSPITAL_COMMUNITY)
Admission: RE | Admit: 2024-03-20 | Discharge: 2024-03-20 | Disposition: A | Discharge status: Home / Self Care | Source: Ambulatory Visit | Attending: Nephrology | Admitting: Nephrology

## 2024-03-20 DIAGNOSIS — R778 Other specified abnormalities of plasma proteins: Secondary | ICD-10-CM | POA: Diagnosis not present

## 2024-03-20 DIAGNOSIS — N1832 Chronic kidney disease, stage 3b: Secondary | ICD-10-CM | POA: Diagnosis not present

## 2024-03-20 DIAGNOSIS — I129 Hypertensive chronic kidney disease with stage 1 through stage 4 chronic kidney disease, or unspecified chronic kidney disease: Secondary | ICD-10-CM | POA: Diagnosis not present

## 2024-03-20 DIAGNOSIS — N189 Chronic kidney disease, unspecified: Secondary | ICD-10-CM | POA: Diagnosis present

## 2024-03-20 DIAGNOSIS — Z79899 Other long term (current) drug therapy: Secondary | ICD-10-CM | POA: Diagnosis present

## 2024-03-20 DIAGNOSIS — E876 Hypokalemia: Secondary | ICD-10-CM | POA: Insufficient documentation

## 2024-03-20 LAB — MAGNESIUM: Magnesium: 2 mg/dL (ref 1.7–2.4)

## 2024-03-20 LAB — CBC
HCT: 33.9 % — ABNORMAL LOW (ref 36.0–46.0)
Hemoglobin: 11 g/dL — ABNORMAL LOW (ref 12.0–15.0)
MCH: 32.9 pg (ref 26.0–34.0)
MCHC: 32.4 g/dL (ref 30.0–36.0)
MCV: 101.5 fL — ABNORMAL HIGH (ref 80.0–100.0)
Platelets: 235 K/uL (ref 150–400)
RBC: 3.34 MIL/uL — ABNORMAL LOW (ref 3.87–5.11)
RDW: 14.5 % (ref 11.5–15.5)
WBC: 7.2 K/uL (ref 4.0–10.5)
nRBC: 0 % (ref 0.0–0.2)

## 2024-03-20 LAB — URINALYSIS, W/ REFLEX TO CULTURE (INFECTION SUSPECTED)
Bacteria, UA: NONE SEEN
Bilirubin Urine: NEGATIVE
Glucose, UA: NEGATIVE mg/dL
Hgb urine dipstick: NEGATIVE
Ketones, ur: NEGATIVE mg/dL
Leukocytes,Ua: NEGATIVE
Nitrite: NEGATIVE
Protein, ur: NEGATIVE mg/dL
Specific Gravity, Urine: 1.013 (ref 1.005–1.030)
pH: 5 (ref 5.0–8.0)

## 2024-03-20 LAB — CREATININE, URINE, 24 HOUR
Collection Interval-UCRE24: 24 h
Creatinine, 24H Ur: 946 mg/(24.h) (ref 740–1570)
Creatinine, Urine: 44 mg/dL
Urine Total Volume-UCRE24: 2150 mL

## 2024-03-20 LAB — RENAL FUNCTION PANEL
Albumin: 4.2 g/dL (ref 3.5–5.0)
Anion gap: 14 (ref 5–15)
BUN: 26 mg/dL — ABNORMAL HIGH (ref 6–20)
CO2: 25 mmol/L (ref 22–32)
Calcium: 11.8 mg/dL — ABNORMAL HIGH (ref 8.9–10.3)
Chloride: 99 mmol/L (ref 98–111)
Creatinine, Ser: 1.8 mg/dL — ABNORMAL HIGH (ref 0.44–1.00)
GFR, Estimated: 34 mL/min — ABNORMAL LOW (ref >60)
Glucose, Bld: 144 mg/dL — ABNORMAL HIGH (ref 70–99)
Phosphorus: 4.1 mg/dL (ref 2.5–4.6)
Potassium: 3.8 mmol/L (ref 3.5–5.1)
Sodium: 138 mmol/L (ref 135–145)

## 2024-03-20 LAB — FERRITIN: Ferritin: 226 ng/mL (ref 11–307)

## 2024-03-20 LAB — HEPATITIS B SURFACE ANTIBODY,QUALITATIVE: Hep B S Ab: NONREACTIVE

## 2024-03-20 LAB — HEMOGLOBIN A1C
Hgb A1c MFr Bld: 5.7 % — ABNORMAL HIGH (ref 4.8–5.6)
Mean Plasma Glucose: 116.89 mg/dL

## 2024-03-20 LAB — IRON AND TIBC
Iron: 72 ug/dL (ref 28–170)
Saturation Ratios: 21 % (ref 10.4–31.8)
TIBC: 339 ug/dL (ref 250–450)
UIBC: 266 ug/dL

## 2024-03-20 LAB — PROTEIN / CREATININE RATIO, URINE
Creatinine, Urine: 90 mg/dL
Protein Creatinine Ratio: 0.3 mg/mg — ABNORMAL HIGH (ref <0.2)
Total Protein, Urine: 24 mg/dL

## 2024-03-20 LAB — HEPATITIS C ANTIBODY: HCV Ab: NONREACTIVE

## 2024-03-20 LAB — VITAMIN B12: Vitamin B-12: 390 pg/mL (ref 180–914)

## 2024-03-20 LAB — T4, FREE: Free T4: 0.49 ng/dL — ABNORMAL LOW (ref 0.80–2.00)

## 2024-03-20 LAB — FOLATE: Folate: 5.7 ng/mL — ABNORMAL LOW (ref >5.9)

## 2024-03-20 LAB — HEPATITIS B SURFACE ANTIGEN: Hepatitis B Surface Ag: NONREACTIVE

## 2024-03-20 LAB — PROTEIN, URINE, 24 HOUR
Collection Interval-UPROT: 24 h
Protein, 24H Urine: 194 mg/(24.h) — ABNORMAL HIGH (ref <150)
Protein, Urine: 9 mg/dL
Urine Total Volume-UPROT: 2150 mL

## 2024-03-20 LAB — VITAMIN D 25 HYDROXY (VIT D DEFICIENCY, FRACTURES): Vit D, 25-Hydroxy: 15.2 ng/mL — ABNORMAL LOW (ref 30–100)

## 2024-03-20 LAB — TSH: TSH: 3.12 u[IU]/mL (ref 0.350–4.500)

## 2024-03-20 LAB — URIC ACID: Uric Acid, Serum: 6.3 mg/dL (ref 2.5–7.1)

## 2024-03-21 LAB — CALCIUM, URINE, 24 HOUR
Calcium, 24 hour urine: 300 mg/(24.h) — ABNORMAL HIGH (ref 0–320)
Calcium, Ur: 14.3 mg/dL
Total Volume: 2150

## 2024-03-21 LAB — HCV INTERPRETATION

## 2024-03-21 LAB — CALCITRIOL (1,25 DI-OH VIT D): Vit D, 1,25-Dihydroxy: <5 pg/mL — ABNORMAL LOW (ref 24.8–81.5)

## 2024-03-21 LAB — PARATHYROID HORMONE, INTACT (NO CA): PTH: 14 pg/mL — ABNORMAL LOW (ref 15–65)

## 2024-03-21 LAB — HIV-1/HIV-2 QUAL RNA
HIV-1 RNA, Qualitative: NONREACTIVE
HIV-2 RNA, Qualitative: NONREACTIVE

## 2024-03-21 LAB — HCV AB W REFLEX TO QUANT PCR: HCV Ab: NONREACTIVE

## 2024-03-24 LAB — KAPPA/LAMBDA LIGHT CHAINS
Kappa free light chain: 36.1 mg/L — ABNORMAL HIGH (ref 3.3–19.4)
Kappa, lambda light chain ratio: 1.1 (ref 0.26–1.65)
Lambda free light chains: 32.9 mg/L — ABNORMAL HIGH (ref 5.7–26.3)

## 2024-03-24 LAB — PROTEIN ELECTROPHORESIS, SERUM
A/G Ratio: 0.9 (ref 0.7–1.7)
Albumin ELP: 3.4 g/dL (ref 2.9–4.4)
Alpha-1-Globulin: 0.2 g/dL (ref 0.0–0.4)
Alpha-2-Globulin: 1.1 g/dL — ABNORMAL HIGH (ref 0.4–1.0)
Beta Globulin: 1.2 g/dL (ref 0.7–1.3)
Gamma Globulin: 1 g/dL (ref 0.4–1.8)
Globulin, Total: 3.6 g/dL (ref 2.2–3.9)
M-Spike, %: 0.3 g/dL — ABNORMAL HIGH
Total Protein ELP: 7 g/dL (ref 6.0–8.5)

## 2024-03-25 LAB — MISC LABCORP TEST (SEND OUT)
Labcorp test code: 141330
Labcorp test code: 120256

## 2024-03-26 LAB — IMMUNOFIXATION, URINE

## 2024-03-27 LAB — VITAMIN A: Vitamin A (Retinoic Acid): 51 ug/dL (ref 20.1–62.0)

## 2024-03-31 LAB — PTH-RELATED PEPTIDE: PTH-related peptide: <2 pmol/L

## 2024-04-10 ENCOUNTER — Telehealth: Payer: Self-pay

## 2024-04-10 DIAGNOSIS — R635 Abnormal weight gain: Secondary | ICD-10-CM

## 2024-04-10 NOTE — Telephone Encounter (Signed)
 Copied from CRM #[id number]. Topic: Medical Record Request - Records Request >> Apr 10, 2024  2:12 PM Ivette P wrote: Reason for CRM: [employer] nurse came out and did paperwork and has paperwork to turn into provider.

## 2024-04-10 NOTE — Telephone Encounter (Signed)
 Copied from CRM [id number]. Topic: Referral - Request for Referral >> Apr 10, 2024  2:11 PM Ivette P wrote: Did the patient discuss referral with their provider in the last year? Yes (If No - schedule appointment) (If Yes - send message)  Appointment offered? No  Type of order/referral and detailed reason for visit: weight management  Preference of office, provider, location: n/a   If referral order, have you been seen by this specialty before? No (If Yes, this issue or another issue? When? Where?  Can we respond through MyChart? Yes, can send my chart message but prefers a phone call.   referral to go to docotr as to why gaining so much weight -

## 2024-04-10 NOTE — Telephone Encounter (Signed)
 Where should we refer her for weight loss? A Dietician?

## 2024-04-11 NOTE — Addendum Note (Signed)
 Addended by: Dmari Schubring, MANDY G on: 04/11/2024 02:52 PM   Modules accepted: Orders

## 2024-04-11 NOTE — Telephone Encounter (Signed)
 Should not need referral to weight loss center in [place]

## 2024-04-11 NOTE — Telephone Encounter (Signed)
 Referral placed.

## 2024-04-14 ENCOUNTER — Encounter: Payer: Self-pay | Admitting: Physician Assistant

## 2024-04-14 ENCOUNTER — Ambulatory Visit: Admitting: Physician Assistant

## 2024-04-14 VITALS — BP 82/56

## 2024-04-14 DIAGNOSIS — K130 Diseases of lips: Secondary | ICD-10-CM

## 2024-04-14 MED ORDER — TRIAMCINOLONE ACETONIDE 0.1 % EX OINT: 1.0000 | TOPICAL_OINTMENT | Freq: Two times a day (BID) | CUTANEOUS | 1 refills | Status: DC

## 2024-04-14 MED ORDER — MUPIROCIN 2 % EX OINT: 1.0000 | TOPICAL_OINTMENT | Freq: Two times a day (BID) | CUTANEOUS | 1 refills | Status: DC

## 2024-04-14 MED ORDER — MUPIROCIN 2 % EX OINT: 1.0000 | TOPICAL_OINTMENT | Freq: Two times a day (BID) | CUTANEOUS | 1 refills | Status: AC

## 2024-04-14 MED ORDER — TRIAMCINOLONE ACETONIDE 0.1 % EX OINT: 1.0000 | TOPICAL_OINTMENT | Freq: Two times a day (BID) | CUTANEOUS | 1 refills | Status: AC

## 2024-04-14 MED ORDER — FLUCONAZOLE 150 MG PO TABS: 150.0000 mg | ORAL_TABLET | ORAL | 0 refills | Status: DC

## 2024-04-14 NOTE — Patient Instructions (Addendum)

## 2024-04-14 NOTE — Progress Notes (Signed)
" ° °  Follow-Up Visit   Subjective  [name] is a 49 y.o. female ESTABLISHED PATIENT who presents for the following: She has irritation at the corners of mouth. It gets dry and cracked. It has been going on for about a year and a half. Dr Alm prescribed hydrocortisone  2.5% ointment but it did not help.  Accompanied by son today.  The following portions of the chart were reviewed this encounter and updated as appropriate: medications, allergies, medical history  Review of Systems:  No other skin or systemic complaints except as noted in HPI or Assessment and Plan.  Objective  Well appearing patient in no apparent distress; mood and affect are within normal limits.   A focused examination was performed of the following areas: Face   Relevant exam findings are noted in the Assessment and Plan.    Assessment & Plan   ANGULAR CHILITIS - SEVERE  Exam Scaly erythematous patches with fissures at corners of mouth   Treatment Plan Mupirocin  ointment Apply to affected areas of corners of mouth twice daily for up to 10 days.   TMC 0.1% ointment Apply to affected areas of corners of mouth twice daily for up to 1 week.   Recommend zinc oxide paste to corners of mouth at bedtime - may consider vaseline or aquaphor instead if she wishes - EVERY NIGHT.   Fluconazole  150 mg Take one tablet weekly.   ANGULAR CHEILITIS    Return in about 4 weeks (around 05/12/2024) for chelitis follow up.  I, Roseline Hutchinson, CMA, am acting as scribe for Bobak Oguinn K, PA-C .   Documentation: I have reviewed the above documentation for accuracy and completeness, and I agree with the above.  Nicolas Sisler K, PA-C    "

## 2024-04-17 ENCOUNTER — Encounter: Payer: Self-pay | Admitting: Nurse Practitioner

## 2024-04-17 ENCOUNTER — Ambulatory Visit: Admitting: Nurse Practitioner

## 2024-04-17 VITALS — BP 122/81 | HR 94 | Temp 97.6°F | Ht 61.0 in | Wt 158.0 lb

## 2024-04-17 DIAGNOSIS — R635 Abnormal weight gain: Secondary | ICD-10-CM

## 2024-04-17 DIAGNOSIS — Z6829 Body mass index (BMI) 29.0-29.9, adult: Secondary | ICD-10-CM | POA: Diagnosis not present

## 2024-04-17 NOTE — Progress Notes (Signed)
 "  Subjective:    Patient ID: [name], female    DOB: 08/18/1975, 49 y.o.   MRN: [id number]   Chief Complaint: Weight Gain   HPI  Patient seen nephrology and was concerned about her weight gain.  Has gained 11lbs since October. She says her eating habits have not changed. She has been walking daily for 30 minutes for several months.  Wt Readings from Last 3 Encounters:  04/17/24 158 lb (71.7 kg)  01/15/24 147 lb (66.7 kg)  12/14/23 141 lb (64 kg)   BMI Readings from Last 3 Encounters:  04/17/24 29.85 kg/m  01/15/24 27.78 kg/m  12/14/23 26.64 kg/m     Patient Active Problem List   Diagnosis Date Noted   Former cigarette smoker 01/15/2024   Bipolar disorder, current episode depressed, severe, with psychotic features (HCC) 01/15/2024   Opiate dependence, continuous (HCC) 01/15/2024   Chronic respiratory failure with hypoxia (HCC) 11/20/2023   GOLD 2 11/19/2023   Acute respiratory failure with hypoxia (HCC) 08/18/2023   Squamous cell carcinoma in situ (SCCIS) 08/06/2023   Abnormal finding of diagnostic imaging 12/22/2020   Angular cheilitis with candidiasis 08/24/2020   Thyroid  disorder 07/15/2020   Weight gain 07/15/2020   Subacute frontal sinusitis 07/13/2020   Generalized anxiety disorder    Asthma    Migraine headaches    Knee pain, bilateral 07/01/2020   Trochanteric bursitis of right hip 07/01/2020   Dysuria 04/30/2020   Presence of auditory hallucinations 12/14/2019   Chronic obstructive pulmonary disease 11/28/2019   Primary insomnia 11/28/2019   Major depressive disorder 11/13/2018   Anemia 08/19/2018   Hypokalemia 08/19/2018   Enlarged lymph node 05/30/2017   DDD (degenerative disc disease), cervical 05/27/2017   Long-term current use of opiate analgesic 04/25/2017   Chronic low back pain 04/25/2017   Chronic neck pain 04/25/2017   Nocturnal hypoxia 08/10/2016   Tubular adenoma of colon 03/26/2016   Chronic pain syndrome 03/05/2016    Essential hypertension 09/03/2015   Irritable bowel syndrome without diarrhea 10/15/2014   Tobacco dependence 01/27/2013   Hot flashes not due to menopause 10/14/2012   Pelvic and perineal pain 06/17/2012   Bipolar 1 disorder 05/15/2012   Rectal prolapse 05/15/2012       Review of Systems  Constitutional:  Negative for diaphoresis.  Eyes:  Negative for pain.  Respiratory:  Negative for shortness of breath.   Cardiovascular:  Negative for chest pain, palpitations and leg swelling.  Gastrointestinal:  Negative for abdominal pain.  Endocrine: Negative for polydipsia.  Skin:  Negative for rash.  Neurological:  Negative for dizziness, weakness and headaches.  Hematological:  Does not bruise/bleed easily.  All other systems reviewed and are negative.      Objective:   Physical Exam Constitutional:      Appearance: Normal appearance.  Cardiovascular:     Rate and Rhythm: Normal rate and regular rhythm.     Heart sounds: Normal heart sounds.  Pulmonary:     Effort: Pulmonary effort is normal.     Breath sounds: Normal breath sounds.  Skin:    General: Skin is warm.  Neurological:     General: No focal deficit present.     Mental Status: She is alert and oriented to person, place, and time.  Psychiatric:        Mood and Affect: Mood normal.        Behavior: Behavior normal.    BP 122/81   Pulse 94   Temp 97.6 F (  36.4 C) (Temporal)   Ht 5' 1 (1.549 m)   Wt 158 lb (71.7 kg)   BMI 29.85 kg/m         Assessment & Plan:   [name] in today with chief complaint of Weight Gain   1. Weight gain (Primary) Write down everything you eat while waiting on referral to weight management Force fluids Continue to exercise  2. BMI 29.0-29.9,adult Patient given referral information to call and make appointment with weight management    The above assessment and management plan was discussed with the patient. The patient verbalized understanding of and has  agreed to the management plan. Patient is aware to call the clinic if symptoms persist or worsen. Patient is aware when to return to the clinic for a follow-up visit. Patient educated on when it is appropriate to go to the emergency department.   Nicole Gladis, FNP   "

## 2024-04-17 NOTE — Patient Instructions (Signed)
Exercises to do While Sitting  Exercises that you do while sitting (chair exercises) can give you many of the same benefits as full exercise. Benefits include strengthening your heart, burning calories, and keeping muscles and joints healthy. Exercise can also improve your mood and help with depression and anxiety. You may benefit from chair exercises if you are unable to do standing exercises due to: Diabetic foot pain. Obesity. Illness. Arthritis. Recovery from surgery or injury. Breathing problems. Balance problems. Another type of disability. Before starting chair exercises, check with your health care provider or a physical therapist to find out how much exercise you can tolerate and which exercises are safe for you. If your health care provider approves: Start out slowly and build up over time. Aim to work up to about 10-20 minutes for each exercise session. Make exercise part of your daily routine. Drink water when you exercise. Do not wait until you are thirsty. Drink every 10-15 minutes. Stop exercising right away if you have pain, nausea, shortness of breath, or dizziness. If you are exercising in a wheelchair, make sure to lock the wheels. Ask your health care provider whether you can do tai chi or yoga. Many positions in these mind-body exercises can be modified to do while seated. Warm-up Before starting other exercises: Sit up as straight as you can. Have your knees bent at 90 degrees, which is the shape of the capital letter "L." Keep your feet flat on the floor. Sit at the front edge of your chair, if you can. Pull in (tighten) the muscles in your abdomen and stretch your spine and neck as straight as you can. Hold this position for a few minutes. Breathe in and out evenly. Try to concentrate on your breathing, and relax your mind. Stretching Exercise A: Arm stretch Hold your arms out straight in front of your body. Bend your hands at the wrist with your fingers pointing  up, as if signaling someone to stop. Notice the slight tension in your forearms as you hold the position. Keeping your arms out and your hands bent, rotate your hands outward as far as you can and hold this stretch. Aim to have your thumbs pointing up and your pinkie fingers pointing down. Slowly repeat arm stretches for one minute as tolerated. Exercise B: Leg stretch If you can move your legs, try to "draw" letters on the floor with the toes of your foot. Write your name with one foot. Write your name with the toes of your other foot. Slowly repeat the movements for one minute as tolerated. Exercise C: Reach for the sky Reach your hands as far over your head as you can to stretch your spine. Move your hands and arms as if you are climbing a rope. Slowly repeat the movements for one minute as tolerated. Range of motion exercises Exercise A: Shoulder roll Let your arms hang loosely at your sides. Lift just your shoulders up toward your ears, then let them relax back down. When your shoulders feel loose, rotate your shoulders in backward and forward circles. Do shoulder rolls slowly for one minute as tolerated. Exercise B: March in place As if you are marching, pump your arms and lift your legs up and down. Lift your knees as high as you can. If you are unable to lift your knees, just pump your arms and move your ankles and feet up and down. March in place for one minute as tolerated. Exercise C: Seated jumping jacks Let your arms hang  down straight. Keeping your arms straight, lift them up over your head. Aim to point your fingers to the ceiling. While you lift your arms, straighten your legs and slide your heels along the floor to your sides, as wide as you can. As you bring your arms back down to your sides, slide your legs back together. If you are unable to use your legs, just move your arms. Slowly repeat seated jumping jacks for one minute as tolerated. Strengthening  exercises Exercise A: Shoulder squeeze Hold your arms straight out from your body to your sides, with your elbows bent and your fists pointed at the ceiling. Keeping your arms in the bent position, move them forward so your elbows and forearms meet in front of your face. Open your arms back out as wide as you can with your elbows still bent, until you feel your shoulder blades squeezing together. Hold for 5 seconds. Slowly repeat the movements forward and backward for one minute as tolerated. Contact a health care provider if: You have to stop exercising due to any of the following: Pain. Nausea. Shortness of breath. Dizziness. Fatigue. You have significant pain or soreness after exercising. Get help right away if: You have chest pain. You have difficulty breathing. These symptoms may represent a serious problem that is an emergency. Do not wait to see if the symptoms will go away. Get medical help right away. Call your local emergency services (911 in the [place] Do not drive yourself to the hospital. Summary Exercises that you do while sitting (chair exercises) can strengthen your heart, burn calories, and keep muscles and joints healthy. You may benefit from chair exercises if you are unable to do standing exercises due to diabetic foot pain, obesity, recovery from surgery or injury, or other conditions. Before starting chair exercises, check with your health care provider or a physical therapist to find out how much exercise you can tolerate and which exercises are safe for you. This information is not intended to replace advice given to you by your health care provider. Make sure you discuss any questions you have with your health care provider. Document Revised: 05/16/2020 Document Reviewed: 05/16/2020 Elsevier Patient Education  2024 [employer].

## 2024-04-21 ENCOUNTER — Institutional Professional Consult (permissible substitution) (INDEPENDENT_AMBULATORY_CARE_PROVIDER_SITE_OTHER): Admitting: Internal Medicine

## 2024-05-07 ENCOUNTER — Ambulatory Visit (INDEPENDENT_AMBULATORY_CARE_PROVIDER_SITE_OTHER): Admitting: Internal Medicine

## 2024-05-07 ENCOUNTER — Encounter (INDEPENDENT_AMBULATORY_CARE_PROVIDER_SITE_OTHER): Payer: Self-pay | Admitting: Internal Medicine

## 2024-05-07 ENCOUNTER — Institutional Professional Consult (permissible substitution) (INDEPENDENT_AMBULATORY_CARE_PROVIDER_SITE_OTHER): Admitting: Internal Medicine

## 2024-05-07 VITALS — BP 111/67 | HR 99 | Temp 98.0°F | Ht 62.0 in | Wt 157.0 lb

## 2024-05-07 DIAGNOSIS — E663 Overweight: Secondary | ICD-10-CM

## 2024-05-07 DIAGNOSIS — R635 Abnormal weight gain: Secondary | ICD-10-CM

## 2024-05-07 DIAGNOSIS — Z6828 Body mass index (BMI) 28.0-28.9, adult: Secondary | ICD-10-CM | POA: Diagnosis not present

## 2024-05-07 DIAGNOSIS — I10 Essential (primary) hypertension: Secondary | ICD-10-CM

## 2024-05-07 DIAGNOSIS — Z87891 Personal history of nicotine dependence: Secondary | ICD-10-CM | POA: Diagnosis not present

## 2024-05-07 DIAGNOSIS — T4395XA Adverse effect of unspecified psychotropic drug, initial encounter: Secondary | ICD-10-CM | POA: Diagnosis not present

## 2024-05-07 DIAGNOSIS — J9611 Chronic respiratory failure with hypoxia: Secondary | ICD-10-CM

## 2024-05-07 DIAGNOSIS — F319 Bipolar disorder, unspecified: Secondary | ICD-10-CM

## 2024-05-07 NOTE — Progress Notes (Unsigned)
 " [address] [place], [place], [place] [address] Office: [phone]  /  Fax: [phone]   Initial Consultation    [name] was seen in clinic today to evaluate for obesity. She is interested in losing weight to improve overall health and reduce the risk of weight related complications. She presents today to review program treatment options, initial physical assessment, and evaluation.    Anthropometrics and Bioimpedance Analysis   Body mass index is 28.72 kg/m. Body Fat Mass : 40% % Visceral Fat Mass Rating : 8 Waist to Height Ratio: TBD  Obesity Related Diseases and Complications  Obesity Quality of Life and Psychosocial Complications: Depression and / or anxiety and Body image dissatisfaction  Cardiometabolic: Hypertension and DOE  Biomechanical: Chronic pain   Weight Related History  She was referred by: PCP  When asked what they would like to accomplish? She states: Adopt a healthier eating pattern and lifestyle, Improve energy levels and physical activity, Improve existing medical conditions, and Improve quality of life  Weight history: See below  Highest weight: 160  Contributing factors: disruption of circadian rhythm / sleep disordered breathing, consumption of processed foods, use of obesogenic medications: Psychotropic medications, reduced physical activity, mental health problems, and menopause  Prior weight loss attempts: None  Current or previous pharmacotherapy: None  Response to medication: Never tried medications  Current nutrition plan: None  Greatest challenge with dieting: none.  Current level of physical activity: Limited due to chronic pain or orthopedic problems  Barriers to Exercise: orthopedic problems and chronic pain  Discussed the use of AI scribe software for clinical note transcription with the patient, who gave verbal consent to proceed.  History of Present Illness [name] is a 49 year old female with COPD and  stage three kidney failure who presents for initial consultation for medical weight management. She is accompanied by her son, Carlin. She was referred by her primary care doctor for weight management.  She has experienced weight gain over the past year and first noticed changes when her weight reached 130 pounds. She finds it challenging to get up after sitting down due to her weight. She finds it challenging to get up after sitting down. She has been on Seroquel  and pain medications for years, which can cause weight gain, but she has not noticed any changes in her weight due to these medications.  She has a history of COPD, attributed to smoking, and quit smoking six or seven months ago. She also has stage three kidney failure of unknown etiology. She reports having fluid around her heart, but it is not currently causing any issues. No high blood pressure or cholesterol problems are present.  She stays at home and engages in physical activity by walking for about thirty minutes a day, weather permitting. She attempts to follow a diet but often skips meals. She has not used medications for weight loss successfully due to difficulty taking prescribed pills and insurance coverage issues for medications like Ozempic or Wegovy .  Her family history includes her sister, mother, and grandmother being overweight, but not obese. She has recently gone through menopause, experiencing symptoms such as hot flashes, and notes that her metabolism has changed, contributing to her weight gain. She drinks mostly water as recommended by her kidney doctors and does not consume alcohol.    Past Medical History   Past Medical History:  Diagnosis Date   Anemia 08/19/2018   Asthma    Bipolar 1 disorder    Chronic low  back pain 04/25/2017   Chronic neck pain 04/25/2017   Chronic obstructive pulmonary disease 11/28/2019   Chronic pain syndrome 03/05/2016   DDD (degenerative disc disease), cervical 05/27/2017    Dysuria 04/30/2020   Enlarged lymph node 05/30/2017   Seen on CTA- reactive vs sarcoidosis.   Essential hypertension 09/03/2015   Generalized anxiety disorder    H. 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 Surveillance colonoscopy in 2021 negative. Repeat next surveillance colonoscopy due in 5 years (2026).     Objective    BP 111/67   Pulse 99   Temp 98 F (36.7 C)   Ht 5' 2 (1.575 m)   Wt 157 lb (71.2 kg)   SpO2 98%   BMI 28.72 kg/m  She was weighed on the bioimpedance scale: Body mass index is 28.72 kg/m.    General:  Alert, oriented and cooperative. Patient is in no acute distress.  Respiratory: Normal respiratory effort, no problems with respiration noted   Gait: able to ambulate independently  Mental Status: Normal mood and affect. Normal behavior. Normal judgment and thought content.   Diagnostic Data Reviewed  BMET    Component Value Date/Time   NA 138 03/20/2024 1115   NA 140 08/23/2023 1558   K 3.8 03/20/2024 1115   CL 99 03/20/2024 1115   CO2 25 03/20/2024 1115   GLUCOSE 144 (H) 03/20/2024 1115   BUN 26 (H) 03/20/2024 1115   BUN 25 (H) 08/23/2023 1558   CREATININE 1.80 (H) 03/20/2024 1115   CALCIUM 11.8 (H) 03/20/2024 1115   CALCIUM 10.5 (H) 08/18/2023 1711   GFRNONAA 34 (L) 03/20/2024 1115   GFRAA 122 05/10/2020 1611   Lab Results   Component Value Date   HGBA1C 5.7 (H) 03/20/2024   No results found for: INSULIN CBC    Component Value Date/Time   WBC 7.2 03/20/2024 1117   RBC 3.34 (L) 03/20/2024 1117   HGB 11.0 (L) 03/20/2024 1117   HGB 13.7 09/04/2023 1209   HCT 33.9 (L) 03/20/2024 1117   HCT 41.6 09/04/2023 1209   PLT 235 03/20/2024 1117   PLT 204 09/04/2023 1209   MCV 101.5 (H) 03/20/2024 1117   MCV 101 (H) 09/04/2023 1209   MCH 32.9 03/20/2024 1117   MCHC 32.4 03/20/2024 1117   RDW 14.5 03/20/2024 1117   RDW 13.6 09/04/2023 1209   Iron/TIBC/Ferritin/ %Sat    Component Value Date/Time   IRON 72 03/20/2024 1118   TIBC 339 03/20/2024 1118   FERRITIN 226 03/20/2024 1118   IRONPCTSAT 21 03/20/2024 1118   Lipid Panel     Component Value Date/Time   CHOL 222 (H) 04/25/2018 1138   TRIG 245 (H) 04/25/2018 1138   HDL 36 (L) 04/25/2018 1138   CHOLHDL 6.2 (H) 04/25/2018 1138   LDLCALC 137 (H) 04/25/2018 1138   Hepatic  Function Panel     Component Value Date/Time   PROT 6.3 08/23/2023 1558   ALBUMIN 4.2 03/20/2024 1115   ALBUMIN 3.9 08/23/2023 1558   AST 39 08/23/2023 1558   ALT 42 (H) 08/23/2023 1558   ALKPHOS 126 (H) 08/23/2023 1558   BILITOT <0.2 08/23/2023 1558      Component Value Date/Time   TSH 3.120 03/20/2024 1121   TSH 2.91 07/15/2020 1014    Medications  Outpatient Encounter Medications as of 05/07/2024  Medication Sig Note   albuterol  (VENTOLIN  HFA) 108 (90 Base) MCG/ACT inhaler Inhale 2 puffs into the lungs every 6 (six) hours as needed for wheezing or shortness of breath.    ALPRAZolam  (XANAX ) 1 MG tablet Take 1 mg by mouth 4 (four) times daily as needed.    baclofen  (LIORESAL ) 10 MG tablet Take 10 mg by mouth 3 (three) times daily as needed for muscle spasms.    fluPHENAZine (PROLIXIN) 2.5 MG tablet Take 2.5 mg by mouth at bedtime.    mupirocin  ointment (BACTROBAN ) 2 % Apply 1 Application topically 2 (two) times daily. Apply to affected areas of corners of the mouth twice  daily for up to 1 week at a time.    naloxone  (NARCAN ) nasal spray 4 mg/0.1 mL Place 1 spray into the nose once. For opioid overdose 09/20/2023: PRN, has not needed.    oxyCODONE -acetaminophen  (PERCOCET) 10-325 MG tablet Take 1 tablet by mouth 4 (four) times daily as needed.    QUEtiapine  (SEROQUEL ) 400 MG tablet Take 800 mg by mouth at bedtime.    triamcinolone  ointment (KENALOG ) 0.1 % Apply 1 Application topically 2 (two) times daily. Apply to affected areas of corners of mouth twice daily as needed for up to 1 week when flaring.    No facility-administered encounter medications on file as of 05/07/2024.     Assessment and Plan   Weight gain due to medication  Overweight (BMI 25.0-29.9)  Bipolar 1 disorder  Essential hypertension  Chronic respiratory failure with hypoxia (HCC)  Former cigarette smoker   Assessment & Plan Weight gain due to medication Patient is on several psychotropic medication known to cause weight gain and problems with glucose metabolism which may result in insulin resistance.  She has been on these medications for several years and does not feel that medications have caused weight gain.  We explained that losing weight may be difficult in the presence of these medications.  She may benefit with treatment with metformin to offset some of the weight gain associated with psychotropic medication Overweight (BMI 25.0-29.9) BMI of 28, with a body fat percentage of 40% and visceral fat score of 8. Weight gain over the past year, potentially exacerbated by menopause and medication effects. No prediabetes, hypertension, or hyperlipidemia. Stage 3 chronic kidney disease and COPD, likely secondary to smoking, hypertension. Recent smoking cessation may contribute to weight gain. No current use of weight loss medications due to insurance constraints. Emphasis on lifestyle modifications for weight management. Discussed the impact of menopause on weight gain and the importance of a  balanced diet and regular physical activity. Highlighted the potential benefits of weight loss on overall health and the challenges of rapid weight loss.  Educated on basics of weight management as well as contributing factors to her weight. - Initiated medical weight loss program with focus on lifestyle modifications. - Scheduled follow-up appointments every 3-4 weeks for support and monitoring. - Conducted metabolism testing to assess metabolic rate. - Provided nutrition counseling and meal planning. -  Ordered blood work to assess biomarkers including glucose metabolism, cholesterol, and fatty liver disease. - Discussed potential use of weight loss medications based on medical conditions and insurance criteria. - Encouraged gradual weight loss of 1-2 pounds per week. - Complete cardiometabolic risk assessment with biomarkers this will be completed at intake appointment - She is interested in weight loss medications we discussed challenges pertaining to coverage and qualifications.  We also educated patient on the the role of antiobesity pharmacotherapy.  We reviewed anthropometrics, biometrics, associated medical conditions and contributing factors with patient. she would benefit from a medically tailored reduced calorie nutrional plan based on her REE (resting energy expenditure), which will be determined by indirect calorimetry.  We will also assess for cardiometabolic risk and nutritional derangements via fasting labs at intake appointment.   Bipolar 1 disorder On Seroquel , alprazolam , Prolixin Essential hypertension Vitals:   05/07/24 1400  BP: 111/67    Blood pressure is at goal for age and risk category.  On no medications    Continue with weight loss therapy. Losing 10% may improve blood pressure control. Monitor for symptoms of orthostasis while losing weight. Continue current regimen and home monitoring for a goal blood pressure of < 120/80.  She do you know why Chronic respiratory  failure with hypoxia [facility]) Patient requires oxygen  with ambulation.  She has quit smoking about 6 months ago this would likely affect her weight.  Losing 10% of body weight particularly around the torso may improve breathing and endurance. Former cigarette smoker Congratulated patient on tobacco cessation.  She is currently not on medications.  Continue monitoring we will review if she is a candidate for lung cancer screening at subsequent appointment      Obesity Treatment and Action Plan:  Patient will work on garnering support from family and friends to begin weight loss journey. Will work on eliminating or reducing the presence of highly palatable, calorie dense foods in the home. Will complete provided nutritional and psychosocial assessment questionnaire before the next appointment. Will be scheduled for indirect calorimetry to determine resting energy expenditure in a fasting state.  This will allow us  to create a reduced calorie, high-protein meal plan to promote loss of fat mass while preserving muscle mass. Counseled on the health benefits of losing 5%-15% of total body weight. Was counseled on nutritional approaches to weight loss and benefits of reducing processed foods and consuming plant-based foods and high quality protein as part of nutritional weight management. Was counseled on pharmacotherapy and role as an adjunct in weight management.   Education and Additional resources  She was weighed on the bioimpedance scale and results were discussed and documented in the synopsis.  We discussed obesity as a progressive, chronic disease and the importance of a more detailed evaluation of all the factors contributing to the disease.  We reviewed the basic principles in obesity management.   We discussed the importance of long term lifestyle changes which include nutrition, exercise and behavioral modification as well as the importance of customizing this to her specific health and  social needs.  We reviewed the role of medical interventions including pharmacotherapy and surgical interventions.   We discussed the benefits of reaching a healthier weight to alleviate the symptoms of existing conditions and reduce the risks of the biomechanical, cardiometabolic and psychological effects of obesity.  We reviewed our program approach and philosophy, which are guided by the four pillars of obesity medicine.  We discussed how to prepare for intake appointment and the importance  of fasting and avoidance of stimulants for at least 8 hours prior to indirect calorimetry.  Clotilda JULIANNA Hopping appears to be in the action stage of change and reports being ready to initiate intensive lifestyle and behavioral modifications as part of their weight loss journey.  Attestation  Reviewed by clinician on day of visit: allergies, medications, problem list, medical history, surgical history, family history, social history, and previous encounter notes pertinent to obesity diagnosis.  I have spent 41 minutes in the care of the patient today including: 4 minutes before the visit reviewing and preparing the chart. 30 minutes face-to-face assessing and reviewing listed medical problems as outlined in obesity care plan, providing nutritional and behavioral counseling on topics outlined in the obesity care plan, counseling regarding anti-obesity medication as outlined in obesity care plan, independently interpreting test results and goals of care, as described in assessment and plan, reviewing and discussing biometric information and progress, and reviewing latest PCP notes and specialist consultations 7 minutes after the visit updating chart and documentation of encounter.   Lucas Parker, MD, ABIM, ABOM   "

## 2024-05-08 DIAGNOSIS — E663 Overweight: Secondary | ICD-10-CM | POA: Insufficient documentation

## 2024-05-08 NOTE — Assessment & Plan Note (Signed)
 BMI of 28, with a body fat percentage of 40% and visceral fat score of 8. Weight gain over the past year, potentially exacerbated by menopause and medication effects. No prediabetes, hypertension, or hyperlipidemia. Stage 3 chronic kidney disease and COPD, likely secondary to smoking, hypertension. Recent smoking cessation may contribute to weight gain. No current use of weight loss medications due to insurance constraints. Emphasis on lifestyle modifications for weight management. Discussed the impact of menopause on weight gain and the importance of a balanced diet and regular physical activity. Highlighted the potential benefits of weight loss on overall health and the challenges of rapid weight loss.  Educated on basics of weight management as well as contributing factors to her weight. - Initiated medical weight loss program with focus on lifestyle modifications. - Scheduled follow-up appointments every 3-4 weeks for support and monitoring. - Conducted metabolism testing to assess metabolic rate. - Provided nutrition counseling and meal planning. - Ordered blood work to assess biomarkers including glucose metabolism, cholesterol, and fatty liver disease. - Discussed potential use of weight loss medications based on medical conditions and insurance criteria. - Encouraged gradual weight loss of 1-2 pounds per week. - Complete cardiometabolic risk assessment with biomarkers this will be completed at intake appointment - She is interested in weight loss medications we discussed challenges pertaining to coverage and qualifications.  We also educated patient on the the role of antiobesity pharmacotherapy.  We reviewed anthropometrics, biometrics, associated medical conditions and contributing factors with patient. she would benefit from a medically tailored reduced calorie nutrional plan based on her REE (resting energy expenditure), which will be determined by indirect calorimetry.  We will also assess  for cardiometabolic risk and nutritional derangements via fasting labs at intake appointment.

## 2024-05-08 NOTE — Assessment & Plan Note (Signed)
 On Seroquel , alprazolam , Prolixin

## 2024-05-08 NOTE — Assessment & Plan Note (Signed)
 Congratulated patient on tobacco cessation.  She is currently not on medications.  Continue monitoring we will review if she is a candidate for lung cancer screening at subsequent appointment

## 2024-05-08 NOTE — Assessment & Plan Note (Signed)
 Patient is on several psychotropic medication known to cause weight gain and problems with glucose metabolism which may result in insulin resistance.  She has been on these medications for several years and does not feel that medications have caused weight gain.  We explained that losing weight may be difficult in the presence of these medications.  She may benefit with treatment with metformin to offset some of the weight gain associated with psychotropic medication

## 2024-05-08 NOTE — Assessment & Plan Note (Signed)
 Vitals:   05/07/24 1400  BP: 111/67    Blood pressure is at goal for age and risk category.  On no medications    Continue with weight loss therapy. Losing 10% may improve blood pressure control. Monitor for symptoms of orthostasis while losing weight. Continue current regimen and home monitoring for a goal blood pressure of < 120/80.  She do you know why

## 2024-05-08 NOTE — Assessment & Plan Note (Signed)
 Patient requires oxygen  with ambulation.  She has quit smoking about 6 months ago this would likely affect her weight.  Losing 10% of body weight particularly around the torso may improve breathing and endurance.

## 2024-05-13 ENCOUNTER — Ambulatory Visit: Admitting: Physician Assistant

## 2024-05-27 ENCOUNTER — Inpatient Hospital Stay: Admitting: Oncology

## 2024-05-27 ENCOUNTER — Inpatient Hospital Stay

## 2024-07-17 ENCOUNTER — Ambulatory Visit: Admitting: Internal Medicine

## 2024-09-29 ENCOUNTER — Ambulatory Visit: Admitting: Dermatology

## 2024-10-22 ENCOUNTER — Ambulatory Visit: Payer: Self-pay
